# Patient Record
Sex: Female | Born: 1955 | Race: White | Hispanic: No | Marital: Single | Smoking: Former smoker
Health system: Southern US, Community
[De-identification: ages and names within clinical notes are randomized; demographics above are authoritative.]

## PROBLEM LIST (undated history)

## (undated) DIAGNOSIS — J45909 Unspecified asthma, uncomplicated: Secondary | ICD-10-CM

## (undated) DIAGNOSIS — Z789 Other specified health status: Secondary | ICD-10-CM

## (undated) HISTORY — PX: COLONOSCOPY: SHX174

## (undated) HISTORY — PX: ENDOMETRIAL ABLATION: SHX621

---

## 2002-03-16 ENCOUNTER — Ambulatory Visit (HOSPITAL_COMMUNITY): Admission: RE | Admit: 2002-03-16 | Discharge: 2002-03-16 | Payer: Self-pay | Admitting: *Deleted

## 2002-03-16 ENCOUNTER — Encounter: Payer: Self-pay | Admitting: *Deleted

## 2002-10-28 ENCOUNTER — Encounter: Payer: Self-pay | Admitting: Family Medicine

## 2002-10-28 ENCOUNTER — Ambulatory Visit (HOSPITAL_COMMUNITY): Admission: RE | Admit: 2002-10-28 | Discharge: 2002-10-28 | Payer: Self-pay | Admitting: Family Medicine

## 2003-02-15 ENCOUNTER — Ambulatory Visit (HOSPITAL_COMMUNITY): Admission: RE | Admit: 2003-02-15 | Discharge: 2003-02-15 | Payer: Self-pay | Admitting: Internal Medicine

## 2003-07-03 ENCOUNTER — Emergency Department (HOSPITAL_COMMUNITY): Admission: EM | Admit: 2003-07-03 | Discharge: 2003-07-03 | Payer: Self-pay | Admitting: General Practice

## 2004-04-06 ENCOUNTER — Ambulatory Visit (HOSPITAL_COMMUNITY): Admission: RE | Admit: 2004-04-06 | Discharge: 2004-04-06 | Payer: Self-pay | Admitting: Obstetrics & Gynecology

## 2005-04-10 ENCOUNTER — Ambulatory Visit (HOSPITAL_COMMUNITY): Admission: RE | Admit: 2005-04-10 | Discharge: 2005-04-10 | Payer: Self-pay | Admitting: Obstetrics & Gynecology

## 2005-05-28 ENCOUNTER — Ambulatory Visit (HOSPITAL_COMMUNITY): Admission: RE | Admit: 2005-05-28 | Discharge: 2005-05-28 | Payer: Self-pay | Admitting: Family Medicine

## 2005-10-02 ENCOUNTER — Ambulatory Visit (HOSPITAL_COMMUNITY): Admission: RE | Admit: 2005-10-02 | Discharge: 2005-10-02 | Payer: Self-pay | Admitting: Family Medicine

## 2007-03-17 ENCOUNTER — Ambulatory Visit (HOSPITAL_COMMUNITY): Admission: RE | Admit: 2007-03-17 | Discharge: 2007-03-17 | Payer: Self-pay | Admitting: Obstetrics & Gynecology

## 2007-03-24 ENCOUNTER — Ambulatory Visit (HOSPITAL_COMMUNITY): Admission: RE | Admit: 2007-03-24 | Discharge: 2007-03-24 | Payer: Self-pay | Admitting: Family Medicine

## 2007-04-09 ENCOUNTER — Ambulatory Visit (HOSPITAL_COMMUNITY): Admission: RE | Admit: 2007-04-09 | Discharge: 2007-04-09 | Payer: Self-pay | Admitting: Pulmonary Disease

## 2008-09-21 ENCOUNTER — Ambulatory Visit (HOSPITAL_COMMUNITY): Admission: RE | Admit: 2008-09-21 | Discharge: 2008-09-21 | Payer: Self-pay | Admitting: Obstetrics & Gynecology

## 2008-10-15 ENCOUNTER — Other Ambulatory Visit: Admission: RE | Admit: 2008-10-15 | Discharge: 2008-10-15 | Payer: Self-pay | Admitting: Obstetrics and Gynecology

## 2008-11-16 ENCOUNTER — Observation Stay: Payer: Self-pay | Admitting: Internal Medicine

## 2009-06-30 IMAGING — CT CT HEAD WITHOUT CONTRAST
1 series · 16 of 30 positions shown, 20 images · non-contrast
Comparison: none

REASON FOR EXAM: LEFT EYE VISION BLURRY WITH VERTIGO YESTERDAY
COMMENTS:

[Series 2: soft tissue · axial · 0.40mm/px · z∈[+412,+547]mm · 16 of 30 slices shown, 20 images]
[im 2/30  brain]
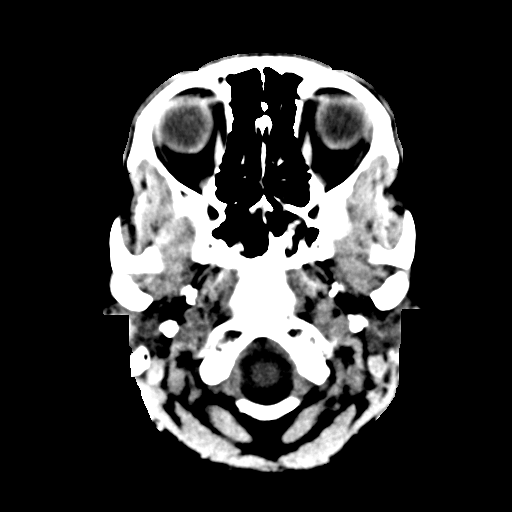
[im 2/30  bone]
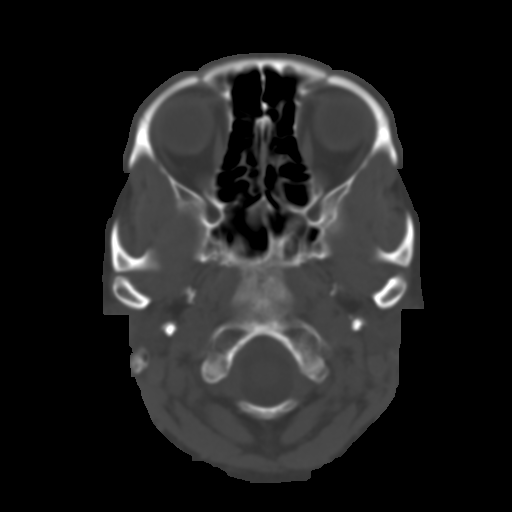
[im 4/30  brain]
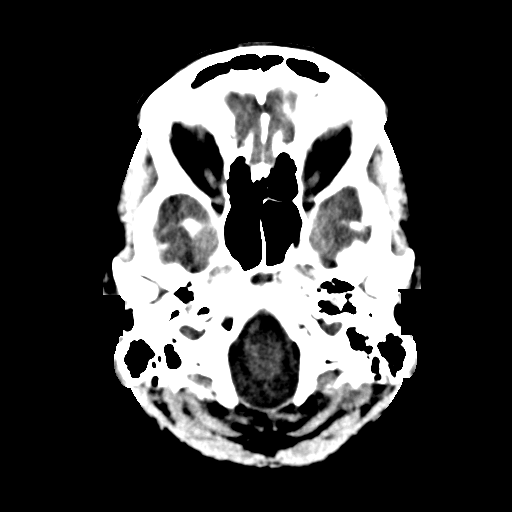
[im 6/30  brain]
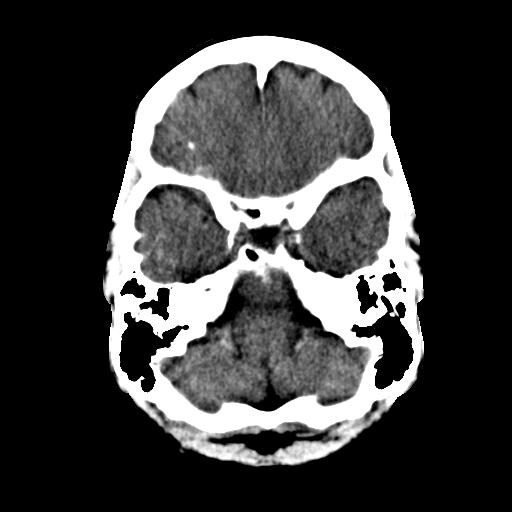
[im 8/30  brain]
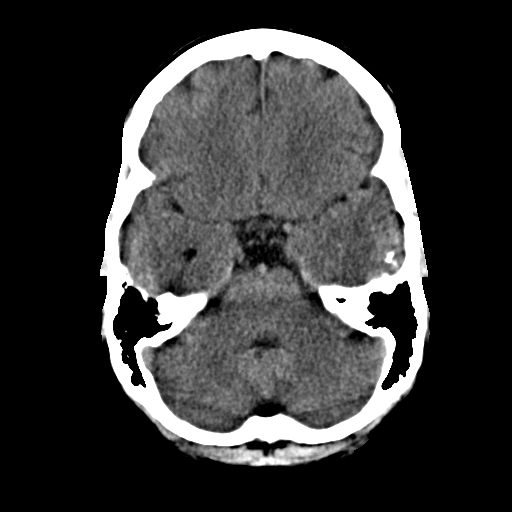
[im 9/30  brain]
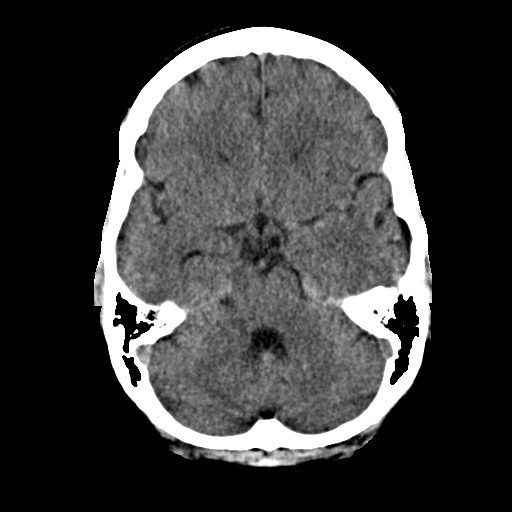
[im 9/30  bone]
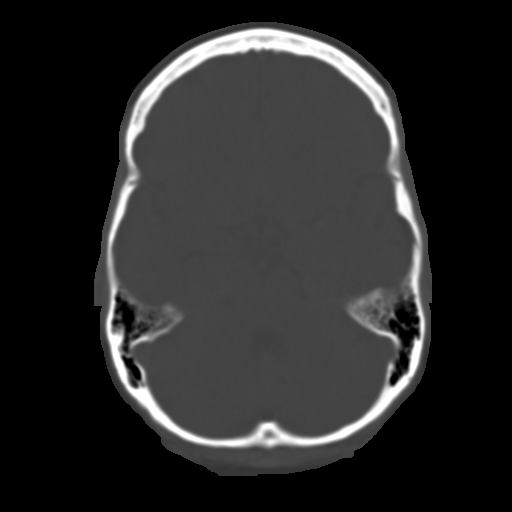
[im 11/30  brain]
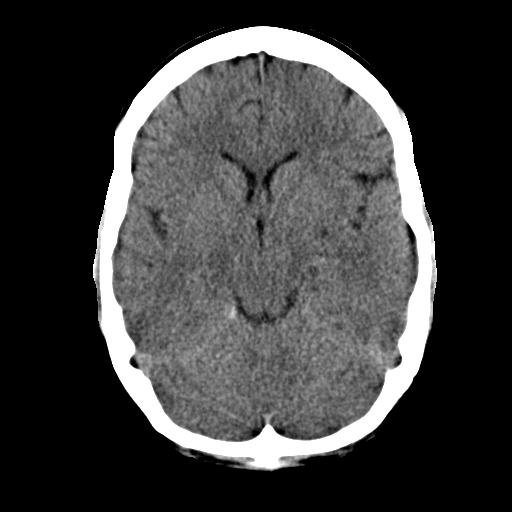
[im 13/30  brain]
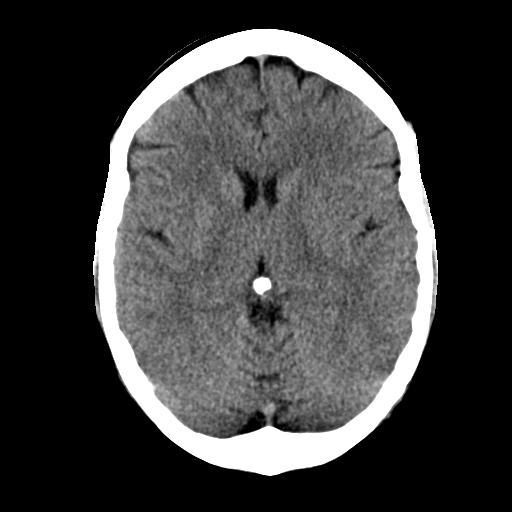
[im 15/30  brain]
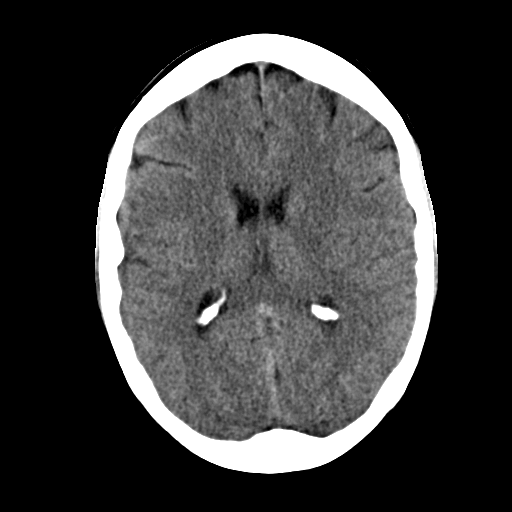
[im 16/30  brain]
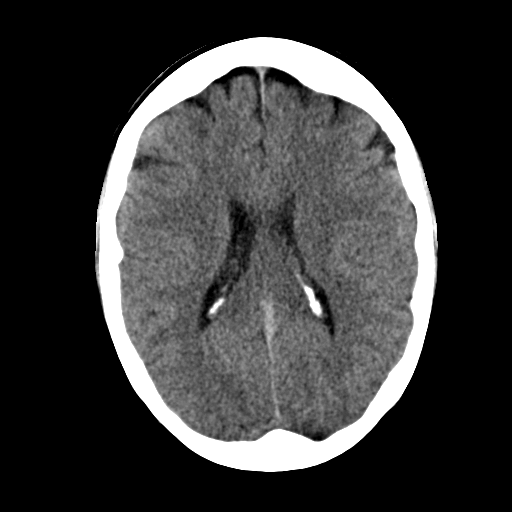
[im 16/30  bone]
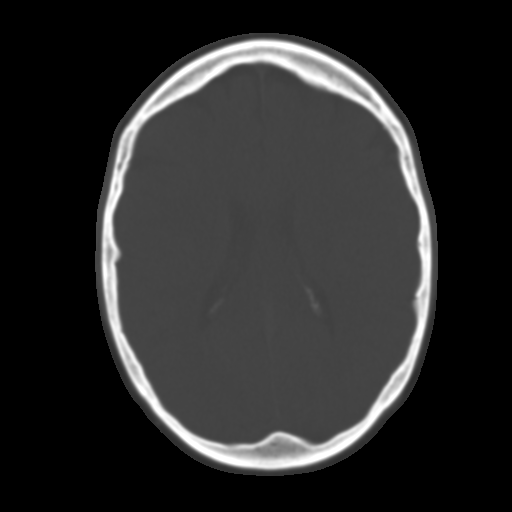
[im 18/30  brain]
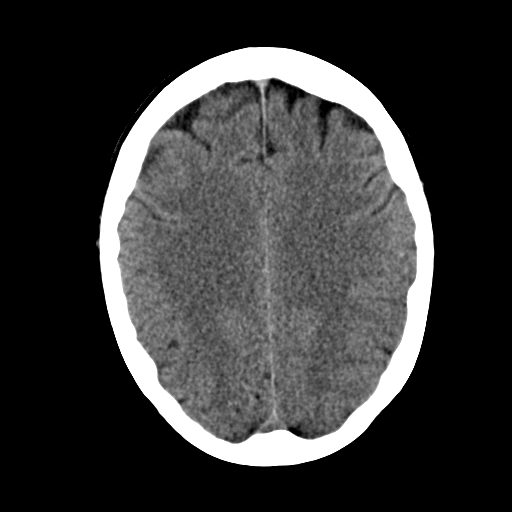
[im 20/30  brain]
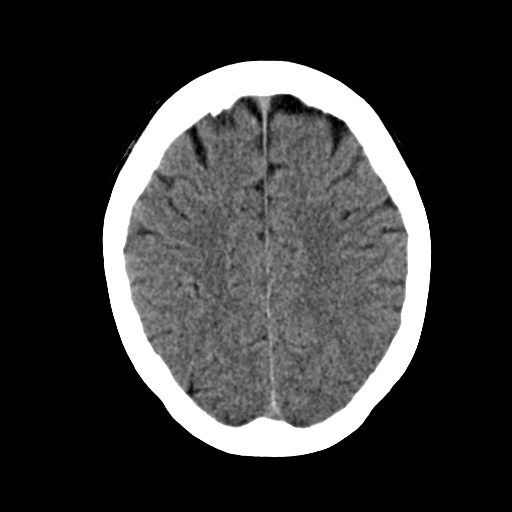
[im 22/30  brain]
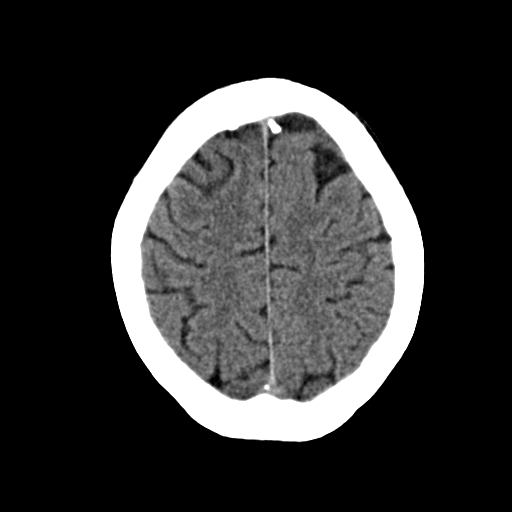
[im 23/30  brain]
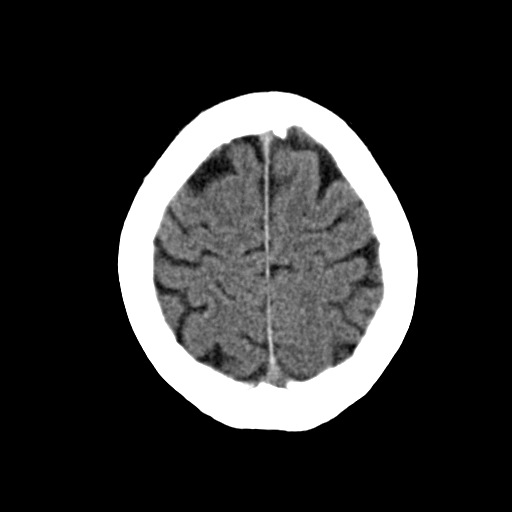
[im 23/30  bone]
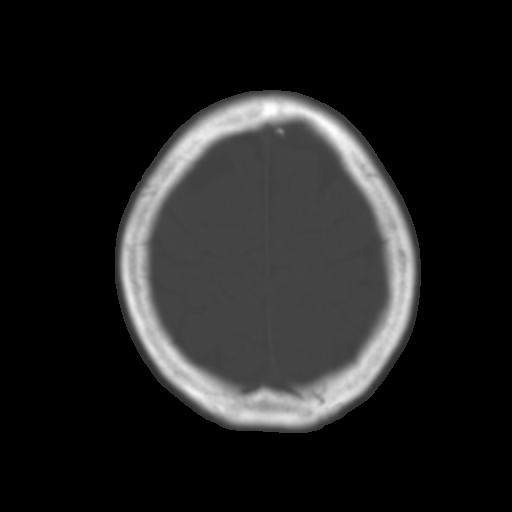
[im 25/30  brain]
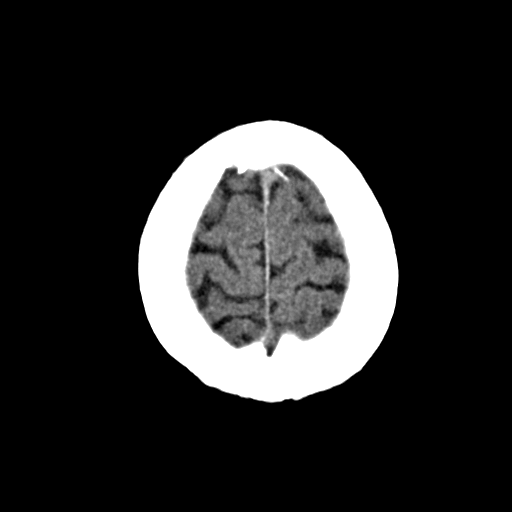
[im 27/30  brain]
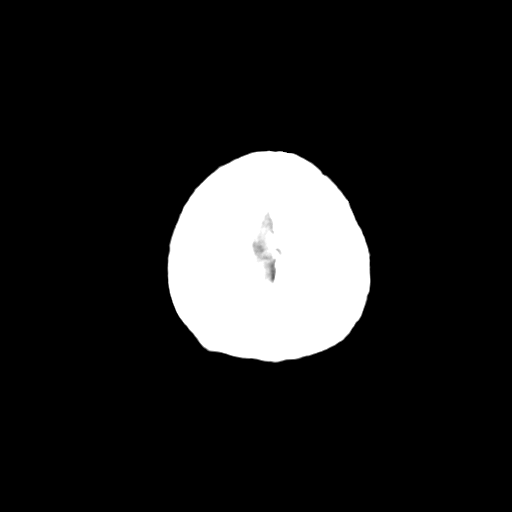
[im 29/30  brain]
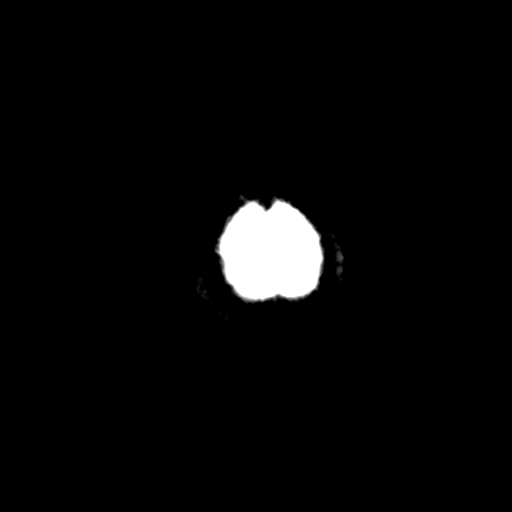

[16 of 30 positions shown; findings below may reference images not displayed]

PROCEDURE:     CT  - CT HEAD WITHOUT CONTRAST  - November 16, 2008 [DATE]

RESULT:     Emergent noncontrast CT of the brain is performed. The
ventricles and sulci appear to be within normal limits. There is no
hemorrhage, mass effect or midline shift. There is no territorial infarct.
There is minimal calcification in the left temporal region which is
nonspecific and base be secondary to prior injury. There is no mass effect.
There is no hemorrhage.

The included paranasal sinuses and mastoids demonstrate normal aeration. The
calvarium appears intact.
IMPRESSION: No acute intracranial abnormality. MRI followup may be
beneficial.

## 2011-01-05 NOTE — Procedures (Signed)
[name] Giles, [name] Giles              ACCOUNT NO.:  [id number]   MEDICAL RECORD NO.:  [id number]          PATIENT TYPE:  OUT   LOCATION:  RESP                          FACILITY:  [facility]   PHYSICIAN:  Edward L. Juanetta Gosling, M.D.DATE OF BIRTH:  1955-12-14   DATE OF PROCEDURE:  04/11/2007  DATE OF DISCHARGE:  04/09/2007                            PULMONARY FUNCTION TEST   1. Spirometry shows a mild ventilatory defect with evidence of airflow      obstruction.  2. Lung volumes are normal.  3. DLCO is mildly reduced.  4. There is no significant bronchodilator effect.  This is consistent      with chronic obstructive pulmonary disease.      Edward L. Juanetta Gosling, M.D.  Electronically Signed     ELH/MEDQ  D:  04/11/2007  T:  04/12/2007  Job:  [id number]   cc:   [facility]

## 2011-01-05 NOTE — Op Note (Signed)
[name] Giles, [name] Giles                        ACCOUNT NO.:  [id number]   MEDICAL RECORD NO.:  [id number]                   PATIENT TYPE:  AMB   LOCATION:  DAY                                  FACILITY:  [facility]   PHYSICIAN:  Lazaro Arms, M.D.                DATE OF BIRTH:  02-Oct-1955   DATE OF PROCEDURE:  04/06/2004  DATE OF DISCHARGE:                                 OPERATIVE REPORT   PREOPERATIVE DIAGNOSIS:  1. Menometrorrhagia.  2. Dysmenorrhea.   POSTOPERATIVE DIAGNOSIS:  1. Menometrorrhagia.  2. Dysmenorrhea.   PROCEDURE:  Hysteroscopy, D&C, endometrial ablation.   SURGEON:  Dr. Despina Hidden.   ANESTHESIA:  General endotracheal.   FINDINGS:  The patient had a shaggy endometrium, maybe a small polyp. It was  unclear if it was a polyp or just shaggy endometrium. The endometrium  otherwise was normal. She had a small endometria cavity.   DESCRIPTION OF OPERATION:  The patient was taken to the operating room,  placed in a supine position, where she underwent general endotracheal  anesthesia. She was prepped and draped in the usual sterile fashion after  being placed in dorsal lithotomy position. Speculum was placed. Cervix was  grasped single toothed tenaculum. Paracervical block was placed using 0.5%  Marcaine with 1 to 200,000 epinephrine. The uterus sounded to 7 cm. The  cervix was dilated serially to allow passage of a 30-degree 5-mm  hysteroscope. The endometrial cavity was distended with normal saline. The  above noted findings were seen. A vigorous curettage was performed.  Hysteroscopy was placed one more time, and again endometrium was cleaned.  Endometrial ablation was performed using the ThermaChoice 3. It required  only 9 cc of D5W heated to 88 degrees Celsius, for a total therapy time of 8  minutes and 32 seconds. The patient tolerated the procedure well. All fluid  was accounted for. She was awakened from anesthesia and taken to recovery  room in good and stable  condition. All counts were correct. She received  Ancef and Toradol prophylactically.      ___________________________________________                                            Lazaro Arms, M.D.   Loraine Maple  D:  04/06/2004  T:  04/06/2004  Job:  [id number]

## 2011-01-05 NOTE — Op Note (Signed)
NAME:  [name] Giles, [name] Giles                        ACCOUNT NO.:  [id number]   MEDICAL RECORD NO.:  [id number]                   PATIENT TYPE:  AMB   LOCATION:  DAY                                  FACILITY:  [facility]   PHYSICIAN:  R. Roetta Sessions, M.D.              DATE OF BIRTH:  06-03-1956   DATE OF PROCEDURE:  02/15/2003  DATE OF DISCHARGE:                                 OPERATIVE REPORT   PROCEDURE:  Esophagogastroduodenoscopy followed by colonoscopy with biopsy.   INDICATIONS FOR PROCEDURE:  The patient is a 55 year old lady with some  typical and atypical gastroesophageal reflux disease symptoms who has noted  significant improvement on a course of Nexium 40 mg orally daily. She was  seen in our office on January 27, 2003. She continues to have reflux daily in  spite of taking Nexium. She was taking caffeinated and carbonated soft  drinks in excess. She also reported small volume blood per rectum several  weeks ago, multiple episodes. Colonoscopy is now being done to further  evaluate her symptoms. Since being seen in the office on January 27, 2003, she  continues to have reflux symptoms daily in spite of taking Nexium 40 mg  orally daily. I discussed with her the concept of just going ahead and  looking in her upper GI tract as well today. The potential risks, benefits,  and alternatives have been reviewed. She is agreeable to having that done as  well. Consequently EGD and colonoscopy are now being performed. Please see  the documentation in the medical record.   MONITORING:  O2 saturation, blood pressure, pulse, and respirations were  monitored throughout the entirety of both procedures.   CONSCIOUS SEDATION FOR BOTH PROCEDURES:  Versed 5 mg IV, Demerol 100 mg IV  in divided doses.   INSTRUMENTS:  Olympus video chip adult gastroscope and pediatric  colonoscope.   EGD FINDINGS:  Examination of the tubular esophagus revealed no mucosal  abnormalities.  The EG junction was easily  traversed.   STOMACH:  The gastric cavity was empty and insufflated well with air. A  thorough examination of the gastric mucosa including a retroflexed view of  the proximal stomach and esophagogastric junction demonstrated no  abnormalities. The pylorus was patent and easily traversed.   DUODENUM:  The bulb and second portion appeared normal.   THERAPEUTIC/DIAGNOSTIC MANEUVERS:  None.   The patient tolerated the procedure well and was prepared for colonoscopy.   A digital rectal examination revealed no abnormalities.   ENDOSCOPIC FINDINGS:  The prep was good.   RECTUM:  Examination of the rectal mucosa including retroflexed view of the  anal verge revealed no internal hemorrhoids.   COLON:  The colonic mucosa was surveyed from the rectosigmoid junction  through the left transverse right colon to the area of the appendiceal  orifice, ileocecal valve and cecum. These structures were seen and  photographed for the record. The patient  was noted to have a  4 mm sessile  polyp in the cecum which was cold biopsied/removed. She also had scattered  sigmoid diverticulum. The remainder of the colonic mucosa was seen and  appeared normal from the level of the cecum to the ileocecal valve.  The  scope was slowly withdrawn. All previously mentioned mucosal surfaces were  again seen. No other abnormalities were observed. The patient tolerated both  procedures well and was reacted in endoscopy.   IMPRESSION:  1. Esophagogastroduodenoscopy.  Normal esophagus, stomach, D1 and D2.  2. Colonoscopy findings.  Internal hemorrhoids, otherwise, normal rectum.     Scattered sigmoid diverticula. Diminutive polyp in the cecum cold     biopsied/removed. The remainder of the colonic mucosa appeared normal.   RECOMMENDATIONS:  1. Hemorrhoid and diverticulosis literature given to Ms. Huskey.  2. Antireflux measures emphasized.  3. Increase Nexium to 40 mg orally twice daily before breakfast and supper.   4. Followup on path.  5. Office visit with [place] in one month.                                               Jonathon Bellows, M.D.    RMR/MEDQ  D:  02/15/2003  T:  02/15/2003  Job:  [id number]   cc:   Kirk Ruths, M.D.  [address]  [place]  [place] [address]  Fax: [phone]

## 2011-01-05 NOTE — H&P (Signed)
[name], [name]                          ACCOUNT NO.:  [id number]   MEDICAL RECORD NO.:  [id number]                  PATIENT TYPE:   LOCATION:                                       FACILITY:   PHYSICIAN:  [name] Giles, M.D.              DATE OF BIRTH:  April 29, 1956   DATE OF ADMISSION:  DATE OF DISCHARGE:                                HISTORY & PHYSICAL   HISTORY OF PRESENT ILLNESS:  This patient is a pleasant, 56 year old lady  sent over by [name] Giles to further evaluate an approximately 8 week  history of chest pressure, sore throat, heartburn symptoms.  She was  initially see by Dr. Domingo Giles approximately 1 month ago for cardiac workup  including a cardiogram, stress test, and echocardiogram with no evidence of  coronary disease.  She was begun on Nexium once daily 1 month ago.  She has  noted at least 50% improvement in the above-mentioned symptoms. She does  not have any alarm symptoms such as odynophagia or dysphagia.  She has not  had any abdominal pain, nausea, or vomiting.  It is notable that she has had  several episodes of small volume hematochezia, some 3-4 weeks ago which has  since cleared up.  She denies constipation or diarrhea.   No family history of colorectal neoplasia.  Does not take nonsteroidals.  She does note an 18 pound weight gain over the past 1 year.  She describes  working in a very stressful environment.  She smokes 1 pack of cigarettes  per day.  She consumes six 12-ounce diet cokes daily routinely.  She does  not get much exercise now days. She works in her job 10-12 hours every day.  She has never had her upper GI tract evaluated, but again, her symptoms just  started approximately 8 weeks ago.   PAST MEDICAL HISTORY:  Past medical history is significant for seasonal  allergies for which she takes Clarinex.  No prior surgeries.   CURRENT MEDICATIONS:  1. Clarinex 5 mg daily.  2. Nexium 40 mg orally daily.   ALLERGIES:  No  known drug allergies.   FAMILY HISTORY:  1. Mother succumbed to breast carcinoma.  2. Father was killed in an industrial accident.  3. No history of chronic GI or liver illness.   SOCIAL HISTORY:  The patient has been married for 19 years, has one 12-year-  old, adopted son.  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  She lives in [place].  She smokes 1 pack of  cigarettes per day.  No alcohol.   REVIEW OF SYSTEMS:  As in history of present illness.   PHYSICAL EXAMINATION:  GENERAL:  Physical examination reveals a pleasant, 28-  year-old lady resting comfortably.  VITAL SIGNS:  Weight 165.  Height 5 feet 6 inches.  Temperature 98.2, BP  130/82, pulse 86.  SKIN:  Warm and dry.  No cutaneous stigmata of chronic liver disease.  HEENT:  No scleral icterus.  Conjunctivae are pink.  NECK:  JVD is not prominent.  CHEST:  Lungs are clear to auscultation.  CARDIAC:  Regular rate and rhythm without murmur, gallop, or rub.  BREASTS:  Breast exam is deferred.  ABDOMEN:  Nondistended, positive bowel sounds.  Soft, minimal diffuse  tenderness to palpation.  No appreciable mass or organomegaly.  EXTREMITIES:  No edema.  RECTAL:  Deferred to the time of colonoscopy.   IMPRESSION:  1. This patient is a 55 year old lady with an 8-week history of sore throat,     chest pressure, and heartburn symptoms; now significantly improved with a     34-month course of Nexium.  It is reassuring to note that she had a     negative cardiac workup.  2. She does not have any alarm features.  She has relatively new onset     gastroesophageal reflux disease.  3. Most likely her symptoms have been precipitated by steady weight gain,     excessive intake of caffeinated/carbonated beverages and cigarette     smoking.   RECOMMENDATIONS:  1. We talked about the multiple problem approach for gastroesophageal reflux     disease.  I have asked her to decaffeinate and decarbonate  her diet.  I     have asked her to limit her intake of soft drinks to 1 daily.  Also     recommended that she stop smoking.  2. I stressed the correlation between weight gain and reflux and I have     asked her to try to lose 10 pounds in the next 10 months.  3. I see no need to perform an upper gastrointestinal endoscopy at this time     or further testing of gastroesophageal reflux.  4. However, as a totally separate issue, she reports hematochezia.  This     needs to be evaluated separately.  Consequently, I have offered the     patient colonoscopy.  I have discussed the potential risks, benefits, and     alternatives with this nice lady.  Questions were answered.  She is     agreeable.  Plan to perform colonoscopy in the near future.  5. She is to continue taking Nexium 40 mg orally daily. I have given her     literature on gastroesophageal reflux disease.  I have given her samples     of Nexium.  We will make further recommendations after colonoscopy has     been performed.   I would like to thank [name] Giles for allowing me to see this nice  lady today.                                               [name] Giles, M.D.    RMR/MEDQ  D:  01/27/2003  T:  01/27/2003  Job:  [id number]   cc:   [name] Giles, M.D.  [address]  [place]  [place] [address]  Fax: [phone]

## 2011-05-17 ENCOUNTER — Other Ambulatory Visit (HOSPITAL_COMMUNITY)
Admission: RE | Admit: 2011-05-17 | Discharge: 2011-05-17 | Disposition: A | Discharge status: Home / Self Care | Payer: BC Managed Care – PPO | Source: Ambulatory Visit | Attending: Obstetrics & Gynecology | Admitting: Obstetrics & Gynecology

## 2011-05-17 ENCOUNTER — Other Ambulatory Visit: Payer: Self-pay | Admitting: Obstetrics & Gynecology

## 2011-05-17 DIAGNOSIS — Z01419 Encounter for gynecological examination (general) (routine) without abnormal findings: Secondary | ICD-10-CM | POA: Insufficient documentation

## 2011-09-19 ENCOUNTER — Other Ambulatory Visit: Payer: Self-pay | Admitting: Obstetrics & Gynecology

## 2011-09-19 DIAGNOSIS — Z139 Encounter for screening, unspecified: Secondary | ICD-10-CM

## 2011-09-24 ENCOUNTER — Ambulatory Visit (HOSPITAL_COMMUNITY)
Admission: RE | Admit: 2011-09-24 | Discharge: 2011-09-24 | Disposition: A | Discharge status: Home / Self Care | Payer: BC Managed Care – PPO | Source: Ambulatory Visit | Attending: Obstetrics & Gynecology | Admitting: Obstetrics & Gynecology

## 2011-09-24 DIAGNOSIS — Z1231 Encounter for screening mammogram for malignant neoplasm of breast: Secondary | ICD-10-CM | POA: Insufficient documentation

## 2011-09-24 DIAGNOSIS — Z139 Encounter for screening, unspecified: Secondary | ICD-10-CM

## 2012-12-23 ENCOUNTER — Other Ambulatory Visit: Payer: Self-pay | Admitting: Obstetrics & Gynecology

## 2012-12-23 DIAGNOSIS — Z139 Encounter for screening, unspecified: Secondary | ICD-10-CM

## 2012-12-30 ENCOUNTER — Ambulatory Visit (HOSPITAL_COMMUNITY)
Admission: RE | Admit: 2012-12-30 | Discharge: 2012-12-30 | Disposition: A | Discharge status: Home / Self Care | Payer: BC Managed Care – PPO | Source: Ambulatory Visit | Attending: Obstetrics & Gynecology | Admitting: Obstetrics & Gynecology

## 2012-12-30 DIAGNOSIS — Z1231 Encounter for screening mammogram for malignant neoplasm of breast: Secondary | ICD-10-CM | POA: Insufficient documentation

## 2012-12-30 DIAGNOSIS — Z139 Encounter for screening, unspecified: Secondary | ICD-10-CM

## 2013-02-23 ENCOUNTER — Ambulatory Visit (HOSPITAL_COMMUNITY)
Admission: RE | Admit: 2013-02-23 | Discharge: 2013-02-23 | Disposition: A | Discharge status: Home / Self Care | Payer: BC Managed Care – PPO | Source: Ambulatory Visit | Attending: Family Medicine | Admitting: Family Medicine

## 2013-02-23 ENCOUNTER — Other Ambulatory Visit (HOSPITAL_COMMUNITY): Payer: Self-pay | Admitting: Family Medicine

## 2013-02-23 DIAGNOSIS — Z0000 Encounter for general adult medical examination without abnormal findings: Secondary | ICD-10-CM

## 2013-02-23 DIAGNOSIS — R05 Cough: Secondary | ICD-10-CM | POA: Insufficient documentation

## 2013-02-23 DIAGNOSIS — R059 Cough, unspecified: Secondary | ICD-10-CM | POA: Insufficient documentation

## 2013-02-23 DIAGNOSIS — N959 Unspecified menopausal and perimenopausal disorder: Secondary | ICD-10-CM

## 2013-02-23 DIAGNOSIS — F17200 Nicotine dependence, unspecified, uncomplicated: Secondary | ICD-10-CM | POA: Insufficient documentation

## 2013-02-23 DIAGNOSIS — R5383 Other fatigue: Secondary | ICD-10-CM

## 2013-02-23 DIAGNOSIS — R5381 Other malaise: Secondary | ICD-10-CM

## 2013-02-23 DIAGNOSIS — R0602 Shortness of breath: Secondary | ICD-10-CM | POA: Insufficient documentation

## 2013-02-25 ENCOUNTER — Ambulatory Visit (HOSPITAL_COMMUNITY)
Admission: RE | Admit: 2013-02-25 | Discharge: 2013-02-25 | Disposition: A | Discharge status: Home / Self Care | Payer: BC Managed Care – PPO | Source: Ambulatory Visit | Attending: Family Medicine | Admitting: Family Medicine

## 2013-02-25 DIAGNOSIS — Z0000 Encounter for general adult medical examination without abnormal findings: Secondary | ICD-10-CM

## 2013-02-25 DIAGNOSIS — M899 Disorder of bone, unspecified: Secondary | ICD-10-CM | POA: Insufficient documentation

## 2013-02-25 DIAGNOSIS — N959 Unspecified menopausal and perimenopausal disorder: Secondary | ICD-10-CM

## 2013-02-25 DIAGNOSIS — Z780 Asymptomatic menopausal state: Secondary | ICD-10-CM | POA: Insufficient documentation

## 2013-03-18 ENCOUNTER — Telehealth: Payer: Self-pay

## 2013-03-18 ENCOUNTER — Other Ambulatory Visit: Payer: Self-pay

## 2013-03-18 DIAGNOSIS — Z1211 Encounter for screening for malignant neoplasm of colon: Secondary | ICD-10-CM

## 2013-03-18 MED ORDER — PEG-KCL-NACL-NASULF-NA ASC-C 100 G PO SOLR: 1.0000 | ORAL | Status: DC

## 2013-03-18 NOTE — Telephone Encounter (Signed)
Appropriate.

## 2013-03-18 NOTE — Telephone Encounter (Signed)
Rx sent to the pharmacy and instructions mailed to pt.  

## 2013-03-18 NOTE — Telephone Encounter (Signed)
Gastroenterology Pre-Procedure Review   PT'S LAST COLONOSCOPY WAS 02/15/2003 WITH RMR  (SHE HAD EGD AND TCS)  HER NAME THEN WAS [name] Giles   Request Date: 03/18/2013 Requesting Physician: Dr. Regino Schultze  PATIENT REVIEW QUESTIONS: The patient responded to the following health history questions as indicated:    1. Diabetes Melitis: no 2. Joint replacements in the past 12 months: no 3. Major health problems in the past 3 months: no 4. Has an artificial valve or MVP: no 5. Has a defibrillator: no 6. Has been advised in past to take antibiotics in advance of a procedure like teeth cleaning: no    MEDICATIONS & ALLERGIES:    Patient reports the following regarding taking any blood thinners:   Plavix? no Aspirin? no Coumadin? no  Patient confirms/reports the following medications:  Current Outpatient Prescriptions  Medication Sig Dispense Refill  . naproxen sodium (ANAPROX) 220 MG tablet Take 220 mg by mouth daily.       No current facility-administered medications for this visit.    Patient confirms/reports the following allergies:  No Known Allergies  No orders of the defined types were placed in this encounter.    AUTHORIZATION INFORMATION Primary Insurance:   ID #:   Group #:  Pre-Cert / Auth required:  Pre-Cert / Auth #:   Secondary Insurance:   ID #:  Group #:  Pre-Cert / Auth required:  Pre-Cert / Auth #:   SCHEDULE INFORMATION: Procedure has been scheduled as follows:  Date: 04/06/2013   Time: 9:30 AM  Location: [facility] Short Stay  This Gastroenterology Pre-Precedure Review Form is being routed to the following provider(s): R. Roetta Sessions, MD

## 2013-03-19 NOTE — Telephone Encounter (Signed)
Pt's colonoscopy  in 2004 showed inflamed adenomatous polyps. Per Gerrit Halls, NP, the path is being scanned in.

## 2013-03-26 ENCOUNTER — Encounter (HOSPITAL_COMMUNITY): Payer: Self-pay | Admitting: Pharmacy Technician

## 2013-04-06 ENCOUNTER — Ambulatory Visit (HOSPITAL_COMMUNITY)
Admission: RE | Admit: 2013-04-06 | Discharge: 2013-04-06 | Disposition: A | Discharge status: Home / Self Care | Payer: BC Managed Care – PPO | Source: Ambulatory Visit | Attending: Internal Medicine | Admitting: Internal Medicine

## 2013-04-06 ENCOUNTER — Encounter (HOSPITAL_COMMUNITY)
Admission: RE | Disposition: A | Discharge status: Home / Self Care | Payer: Self-pay | Source: Ambulatory Visit | Attending: Internal Medicine

## 2013-04-06 ENCOUNTER — Encounter (HOSPITAL_COMMUNITY): Payer: Self-pay | Admitting: *Deleted

## 2013-04-06 DIAGNOSIS — D126 Benign neoplasm of colon, unspecified: Secondary | ICD-10-CM | POA: Insufficient documentation

## 2013-04-06 DIAGNOSIS — Z860100 Personal history of colon polyps, unspecified: Secondary | ICD-10-CM | POA: Insufficient documentation

## 2013-04-06 DIAGNOSIS — K5730 Diverticulosis of large intestine without perforation or abscess without bleeding: Secondary | ICD-10-CM

## 2013-04-06 DIAGNOSIS — Z791 Long term (current) use of non-steroidal anti-inflammatories (NSAID): Secondary | ICD-10-CM | POA: Insufficient documentation

## 2013-04-06 DIAGNOSIS — K648 Other hemorrhoids: Secondary | ICD-10-CM | POA: Insufficient documentation

## 2013-04-06 DIAGNOSIS — Z1211 Encounter for screening for malignant neoplasm of colon: Secondary | ICD-10-CM

## 2013-04-06 DIAGNOSIS — F17200 Nicotine dependence, unspecified, uncomplicated: Secondary | ICD-10-CM | POA: Insufficient documentation

## 2013-04-06 HISTORY — PX: COLONOSCOPY: SHX5424

## 2013-04-06 HISTORY — DX: Other specified health status: Z78.9

## 2013-04-06 SURGERY — COLONOSCOPY
Anesthesia: Moderate Sedation

## 2013-04-06 MED ORDER — MIDAZOLAM HCL 5 MG/5ML IJ SOLN
INTRAMUSCULAR | Status: DC | PRN
  Administered 2013-04-06 (×2): 1 mg via INTRAVENOUS
  Administered 2013-04-06: 2 mg via INTRAVENOUS
  Administered 2013-04-06: 1 mg via INTRAVENOUS

## 2013-04-06 MED ORDER — ONDANSETRON HCL 4 MG/2ML IJ SOLN
INTRAMUSCULAR | Status: AC
  Filled 2013-04-06: qty 2

## 2013-04-06 MED ORDER — MIDAZOLAM HCL 5 MG/5ML IJ SOLN
INTRAMUSCULAR | Status: AC
  Filled 2013-04-06: qty 10

## 2013-04-06 MED ORDER — MEPERIDINE HCL 100 MG/ML IJ SOLN
INTRAMUSCULAR | Status: DC | PRN
  Administered 2013-04-06: 25 mg via INTRAVENOUS
  Administered 2013-04-06: 50 mg via INTRAVENOUS
  Administered 2013-04-06: 25 mg via INTRAVENOUS

## 2013-04-06 MED ORDER — ONDANSETRON HCL 4 MG/2ML IJ SOLN
INTRAMUSCULAR | Status: DC | PRN
  Administered 2013-04-06: 4 mg via INTRAVENOUS

## 2013-04-06 MED ORDER — MEPERIDINE HCL 100 MG/ML IJ SOLN
INTRAMUSCULAR | Status: AC
  Filled 2013-04-06: qty 2

## 2013-04-06 MED ORDER — SODIUM CHLORIDE 0.9 % IV SOLN
INTRAVENOUS | Status: DC
  Administered 2013-04-06: 09:00:00 via INTRAVENOUS

## 2013-04-06 MED ORDER — STERILE WATER FOR IRRIGATION IR SOLN
Status: DC | PRN
  Administered 2013-04-06: 09:00:00

## 2013-04-06 NOTE — Op Note (Signed)
[facility] [address] [place], [address]   COLONOSCOPY PROCEDURE REPORT  PATIENT: [name] Giles, [name] Giles  MR#:         [id number] BIRTHDATE: 03/17/1956 , 56  yrs. old GENDER: Female ENDOSCOPIST: R.  Roetta Sessions, MD FACP FACG REFERRED BY:  Karleen Hampshire, M.D. PROCEDURE DATE:  04/06/2013 PROCEDURE:     Ileocolonoscopy with biopsy and snare polypectomy  INDICATIONS: distant history of colonic adenoma  INFORMED CONSENT:  The risks, benefits, alternatives and imponderables including but not limited to bleeding, perforation as well as the possibility of a missed lesion have been reviewed.  The potential for biopsy, lesion removal, etc. have also been discussed.  Questions have been answered.  All parties agreeable. Please see the history and physical in the medical record for more information.  MEDICATIONS: Versed 5 mg IV and Demerol 100 mg IV in divided doses. Zofran 4 mg  DESCRIPTION OF PROCEDURE:  After a digital rectal exam was performed, the EC-[id number] ([id number])  colonoscope was advanced from the anus through the rectum and colon to the area of the cecum, ileocecal valve and appendiceal orifice.  The cecum was deeply intubated.  These structures were well-seen and photographed for the record.  From the level of the cecum and ileocecal valve, the scope was slowly and cautiously withdrawn.  The mucosal surfaces were carefully surveyed utilizing scope tip deflection to facilitate fold flattening as needed.  The scope was pulled down into the rectum where a thorough examination including retroflexion was performed.    FINDINGS: Adequate preparation Internal hemorrhoids; otherwise, normal rectum. Scattered left-sided diverticula (2) sigmoid colon polyps both diminutive polyp (1) cold snared and the other cold biopsied. The patient had 2 polyps in the cecum  - the largest approximately 2 mm x 5 mm in dimensions. These polyps were cold biopsied and hot snared,  respectively.   The remainder of colonic mucosa appeared normal. The distal 5 cm of terminal ileum mucosa appeared normal.  THERAPEUTIC / DIAGNOSTIC MANEUVERS PERFORMED:  as outlined above.  COMPLICATIONS: None  CECAL WITHDRAWAL TIME:  12 minutes  IMPRESSION:  Colonic polyps-removed as described above. Colonic diverticulosis.  RECOMMENDATIONS: Followup on pathology.   _______________________________ eSigned:  R. Roetta Sessions, MD FACP [facility] 04/06/2013 9:58 AM   CC:    PATIENT NAME:  [name] Giles, [name] Giles MR#: [id number]

## 2013-04-06 NOTE — H&P (Signed)
  Primary Care Physician:  Kirk Ruths, MD Primary Gastroenterologist:  Dr. Jena Gauss  Pre-Procedure History & Physical: HPI:  [name] Giles is a 57 y.o. female is here for a  colonoscopy. History of adenomatous polyps removed in 2004.  Patient did not return for followup. No bowel symptoms. No family history of colon polyps or colon cancer.  Past Medical History  Diagnosis Date  . Medical history non-contributory     Past Surgical History  Procedure Laterality Date  . Endometrial ablation    . Colonoscopy      Prior to Admission medications   Medication Sig Start Date End Date Taking? Authorizing Provider  naproxen sodium (ANAPROX) 220 MG tablet Take 220 mg by mouth daily.   Yes Historical Provider, MD    Allergies as of 03/18/2013  . (No Known Allergies)    Family History  Problem Relation Age of Onset  . Colon cancer Neg Hx     History   Social History  . Marital Status: Divorced    Spouse Name: N/A    Number of Children: N/A  . Years of Education: N/A   Occupational History  . Not on file.   Social History Main Topics  . Smoking status: Current Every Day Smoker -- 1.00 packs/day for 30 years    Types: Cigarettes  . Smokeless tobacco: Not on file  . Alcohol Use: Yes     Comment: wine occasionally  . Drug Use: No  . Sexual Activity: Not on file   Other Topics Concern  . Not on file   Social History Narrative  . No narrative on file    Review of Systems: See HPI, otherwise negative ROS  Physical Exam: BP 125/56  Pulse 101  Temp(Src) 97.9 F (36.6 C) (Oral)  Resp 18  Ht 5\' 6"  (1.676 m)  Wt 145 lb (65.772 kg)  BMI 23.41 kg/m2  SpO2 95% General:   Alert,  Well-developed, well-nourished, pleasant and cooperative in NAD Head:  Normocephalic and atraumatic. Eyes:  Sclera clear, no icterus.   Conjunctiva pink. Ears:  Normal auditory acuity. Nose:  No deformity, discharge,  or lesions. Mouth:  No deformity or lesions, dentition normal. Neck:   Supple; no masses or thyromegaly. Lungs:  Clear throughout to auscultation.   No wheezes, crackles, or rhonchi. No acute distress. Heart:  Regular rate and rhythm; no murmurs, clicks, rubs,  or gallops. Abdomen:  Soft, nontender and nondistended. No masses, hepatosplenomegaly or hernias noted. Normal bowel sounds, without guarding, and without rebound.   Msk:  Symmetrical without gross deformities. Normal posture. Pulses:  Normal pulses noted. Extremities:  Without clubbing or edema. Neurologic:  Alert and  oriented x4;  grossly normal neurologically. Skin:  Intact without significant lesions or rashes. Cervical Nodes:  No significant cervical adenopathy. Psych:  Alert and cooperative. Normal mood and affect.  Impression/Plan: [name] Giles is now here to undergo a colonoscopy. Surveillance examination. History of colonic adenoma.    Risks, benefits, limitations, imponderables and alternatives regarding colonoscopy have been reviewed with the patient. Questions have been answered. All parties agreeable.

## 2013-04-07 ENCOUNTER — Encounter: Payer: Self-pay | Admitting: Internal Medicine

## 2013-04-07 ENCOUNTER — Encounter (HOSPITAL_COMMUNITY): Payer: Self-pay | Admitting: Internal Medicine

## 2014-03-10 ENCOUNTER — Other Ambulatory Visit: Payer: Self-pay | Admitting: Obstetrics & Gynecology

## 2014-03-10 DIAGNOSIS — Z1231 Encounter for screening mammogram for malignant neoplasm of breast: Secondary | ICD-10-CM

## 2014-05-18 ENCOUNTER — Emergency Department: Payer: Self-pay | Admitting: Emergency Medicine

## 2014-05-18 LAB — BASIC METABOLIC PANEL
Anion Gap: 6 — ABNORMAL LOW (ref 7–16)
BUN: 9 mg/dL (ref 7–18)
CALCIUM: 9.2 mg/dL (ref 8.5–10.1)
CHLORIDE: 105 mmol/L (ref 98–107)
CO2: 28 mmol/L (ref 21–32)
CREATININE: 0.7 mg/dL (ref 0.60–1.30)
EGFR (African American): >60
Glucose: 87 mg/dL (ref 65–99)
Osmolality: 276 (ref 275–301)
Potassium: 4 mmol/L (ref 3.5–5.1)
Sodium: 139 mmol/L (ref 136–145)

## 2014-05-18 LAB — CBC
HCT: 43.5 % (ref 35.0–47.0)
HGB: 14.2 g/dL (ref 12.0–16.0)
MCH: 30.3 pg (ref 26.0–34.0)
MCHC: 32.7 g/dL (ref 32.0–36.0)
MCV: 93 fL (ref 80–100)
PLATELETS: 269 10*3/uL (ref 150–440)
RBC: 4.71 10*6/uL (ref 3.80–5.20)
RDW: 13.3 % (ref 11.5–14.5)
WBC: 8.6 10*3/uL (ref 3.6–11.0)

## 2014-05-18 LAB — TROPONIN I: Troponin-I: <0.02 ng/mL

## 2015-04-07 ENCOUNTER — Other Ambulatory Visit: Payer: Self-pay | Admitting: Obstetrics & Gynecology

## 2015-04-07 DIAGNOSIS — Z1231 Encounter for screening mammogram for malignant neoplasm of breast: Secondary | ICD-10-CM

## 2015-04-11 ENCOUNTER — Ambulatory Visit (HOSPITAL_COMMUNITY)
Admission: RE | Admit: 2015-04-11 | Discharge: 2015-04-11 | Disposition: A | Discharge status: Home / Self Care | Payer: BLUE CROSS/BLUE SHIELD | Source: Ambulatory Visit | Attending: Obstetrics & Gynecology | Admitting: Obstetrics & Gynecology

## 2015-04-11 DIAGNOSIS — Z1231 Encounter for screening mammogram for malignant neoplasm of breast: Secondary | ICD-10-CM | POA: Insufficient documentation

## 2015-06-22 ENCOUNTER — Other Ambulatory Visit (HOSPITAL_COMMUNITY): Payer: Self-pay | Admitting: Family Medicine

## 2015-06-22 ENCOUNTER — Ambulatory Visit (HOSPITAL_COMMUNITY)
Admission: RE | Admit: 2015-06-22 | Discharge: 2015-06-22 | Disposition: A | Discharge status: Home / Self Care | Payer: BLUE CROSS/BLUE SHIELD | Source: Ambulatory Visit | Attending: Family Medicine | Admitting: Family Medicine

## 2015-06-22 DIAGNOSIS — J069 Acute upper respiratory infection, unspecified: Secondary | ICD-10-CM

## 2015-06-22 DIAGNOSIS — R05 Cough: Secondary | ICD-10-CM | POA: Diagnosis present

## 2015-08-01 ENCOUNTER — Other Ambulatory Visit: Payer: Self-pay | Admitting: Family Medicine

## 2015-08-01 DIAGNOSIS — Z780 Asymptomatic menopausal state: Secondary | ICD-10-CM

## 2016-04-30 ENCOUNTER — Other Ambulatory Visit (HOSPITAL_COMMUNITY): Payer: Self-pay | Admitting: Family Medicine

## 2016-04-30 ENCOUNTER — Other Ambulatory Visit (HOSPITAL_COMMUNITY): Payer: Self-pay | Admitting: Internal Medicine

## 2016-04-30 DIAGNOSIS — Z1231 Encounter for screening mammogram for malignant neoplasm of breast: Secondary | ICD-10-CM

## 2016-04-30 DIAGNOSIS — Z780 Asymptomatic menopausal state: Secondary | ICD-10-CM

## 2016-05-04 ENCOUNTER — Encounter: Payer: Self-pay | Admitting: Internal Medicine

## 2016-05-04 ENCOUNTER — Ambulatory Visit (INDEPENDENT_AMBULATORY_CARE_PROVIDER_SITE_OTHER): Payer: BLUE CROSS/BLUE SHIELD | Admitting: Internal Medicine

## 2016-05-04 ENCOUNTER — Ambulatory Visit: Payer: BLUE CROSS/BLUE SHIELD | Admitting: Internal Medicine

## 2016-05-04 VITALS — BP 134/80 | HR 80 | Ht 66.0 in | Wt 151.0 lb

## 2016-05-04 DIAGNOSIS — R42 Dizziness and giddiness: Secondary | ICD-10-CM | POA: Diagnosis not present

## 2016-05-04 DIAGNOSIS — R0602 Shortness of breath: Secondary | ICD-10-CM

## 2016-05-04 NOTE — Patient Instructions (Addendum)
Your physician recommends that you schedule a follow-up appointment in: To be Determined   Your physician has requested that you have an echocardiogram. Echocardiography is a painless test that uses sound waves to create images of your heart. It provides your doctor with information about the size and shape of your heart and how well your heart's chambers and valves are working. This procedure takes approximately one hour. There are no restrictions for this procedure.  Your physician recommends that you continue on your current medications as directed. Please refer to the Current Medication list given to you today.  If you need a refill on your cardiac medications before your next appointment, please call your pharmacy.  Thank you for choosing [facility]!

## 2016-05-04 NOTE — Progress Notes (Signed)
Cardiology Office Note   Date:  05/04/2016   ID:  [name] Giles, DOB 1956/01/29, MRN [id number]  PCP:  Glo Herring., MD  Cardiologist:   Dorris Carnes, MD    Pt referred for dizziness     History of Present Illness: [name] Giles is a 60 y.o. female with a history of dizziness/presyncope   PT had spell on July 28  [employer]  Stood on tip toes  Reached up  Then almost passed out  Held on to cart  Spinning   Nausea  Worst spell she has had   Spells started about 6 or 8 months ago  No illness prior   No syncope   After spell will feel a little washed out  Since July has had about 3 spells  None as severe  One occurred while sitting  Another occurred while looking up  / reaching  [facility] not having feels fine  Activities:  Works at [employer] hour to work  Not walking regularly  For about 5 years  Working on fixing up house   Appetite OK No change in [employer] goes slower  (push mow)  Not with vacuuming   NO CP   .   Mother had vertigo   Labs:  Hgb normal  LDL 109 EKG  SR Nonspecific ST changes (at Dr Nolon Rod office)  Outpatient Medications Prior to Visit  Medication Sig Dispense Refill  . naproxen sodium (ANAPROX) 220 MG tablet Take 220 mg by mouth daily.     No facility-administered medications prior to visit.      Allergies:   Review of patient's allergies indicates no known allergies.   Past Medical History:  Diagnosis Date  . Medical history non-contributory     Past Surgical History:  Procedure Laterality Date  . COLONOSCOPY    . COLONOSCOPY N/A 04/06/2013   Procedure: COLONOSCOPY;  Surgeon: Daneil Dolin, MD;  Location: [facility];  Service: Endoscopy;  Laterality: N/A;  9:30 AM  . ENDOMETRIAL ABLATION       Social History:  The patient  reports that she has been smoking Cigarettes.  She has a 30.00 pack-year smoking history. She has never used smokeless tobacco. She reports that she drinks alcohol. She reports that she does not  use drugs.   Family History:  The patient's family history includes Breast cancer in her mother; Diabetes in her brother; Hypertension in her brother and brother.    ROS:  Please see the history of present illness. All other systems are reviewed and  Negative to the above problem except as noted.    PHYSICAL EXAM: VS:  BP 134/80 (BP Location: Left Arm, Patient Position: Sitting, Cuff Size: Normal)   Pulse 80   Ht 5\' 6"  (1.676 m)   Wt 151 lb (68.5 kg)   SpO2 97%   BMI 24.37 kg/m   GEN: Well nourished, well developed, in no acute distress  HEENT: normal  Neck: no JVD, carotid bruits, or masses Cardiac: RRR; no murmurs, rubs, or gallops,no edema  Respiratory:  clear to auscultation bilaterally, normal work of breathing GI: soft, nontender, nondistended, + BS  No hepatomegaly  MS: no deformity Moving all extremities   Skin: warm and dry, no rash Neuro:  Strength and sensation are intact Psych: euthymic mood, full affect   EKG:  EKG is not ordered today.   Lipid Panel No results found for: CHOL, TRIG, HDL, CHOLHDL, VLDL, LDLCALC, LDLDIRECT  Wt Readings from Last 3 Encounters:  05/04/16 151 lb (68.5 kg)  04/06/13 145 lb (65.8 kg)      ASSESSMENT AND PLAN:  1  Dizziness/presyncope  Began about 8 months ago  ONe bad spell  Interesting this and oneother occurred while pt looking up/ reaching . Not orthoststic  BP actually high ? Carotid sinus hypersensitivity. I encouraged her to drink fluids  Add some salt  Take BP when feels bad  Watch with neck extention.  (I did not check with carotid sinus massag in clnic  2.  SOB  Somewhat concerning   I would sched echo  If normal  ? Stress test  3.  Tob  Counselled on cessatoin  Will f/u with pt on test results  COnsider stress test id LVEF  Normal      Disposition:   FU with  in   Signed, Dorris Carnes, MD  05/04/2016 8:40 AM    [address], [place], [place]  [address] Phone: [phone]; Fax: [phone]

## 2016-05-07 ENCOUNTER — Ambulatory Visit (HOSPITAL_COMMUNITY)
Admission: RE | Admit: 2016-05-07 | Discharge: 2016-05-07 | Disposition: A | Discharge status: Home / Self Care | Payer: BLUE CROSS/BLUE SHIELD | Source: Ambulatory Visit | Attending: Family Medicine | Admitting: Family Medicine

## 2016-05-07 ENCOUNTER — Ambulatory Visit (HOSPITAL_COMMUNITY)
Admission: RE | Admit: 2016-05-07 | Discharge: 2016-05-07 | Disposition: A | Discharge status: Home / Self Care | Payer: BLUE CROSS/BLUE SHIELD | Source: Ambulatory Visit | Attending: Internal Medicine | Admitting: Internal Medicine

## 2016-05-07 DIAGNOSIS — Z13820 Encounter for screening for osteoporosis: Secondary | ICD-10-CM | POA: Insufficient documentation

## 2016-05-07 DIAGNOSIS — M810 Age-related osteoporosis without current pathological fracture: Secondary | ICD-10-CM | POA: Diagnosis not present

## 2016-05-07 DIAGNOSIS — Z1231 Encounter for screening mammogram for malignant neoplasm of breast: Secondary | ICD-10-CM | POA: Insufficient documentation

## 2016-05-07 DIAGNOSIS — Z780 Asymptomatic menopausal state: Secondary | ICD-10-CM

## 2016-05-11 ENCOUNTER — Ambulatory Visit (HOSPITAL_COMMUNITY)
Admission: RE | Admit: 2016-05-11 | Discharge: 2016-05-11 | Disposition: A | Discharge status: Home / Self Care | Payer: BLUE CROSS/BLUE SHIELD | Source: Ambulatory Visit | Attending: Internal Medicine | Admitting: Internal Medicine

## 2016-05-11 DIAGNOSIS — Z720 Tobacco use: Secondary | ICD-10-CM | POA: Insufficient documentation

## 2016-05-11 DIAGNOSIS — I340 Nonrheumatic mitral (valve) insufficiency: Secondary | ICD-10-CM | POA: Diagnosis not present

## 2016-05-11 DIAGNOSIS — I358 Other nonrheumatic aortic valve disorders: Secondary | ICD-10-CM | POA: Insufficient documentation

## 2016-05-11 DIAGNOSIS — R42 Dizziness and giddiness: Secondary | ICD-10-CM | POA: Insufficient documentation

## 2016-05-11 DIAGNOSIS — R0602 Shortness of breath: Secondary | ICD-10-CM | POA: Insufficient documentation

## 2016-05-11 LAB — ECHOCARDIOGRAM COMPLETE
AVLVOTPG: 4 mmHg
CHL CUP MV DEC (S): 282
CHL CUP STROKE VOLUME: 32 mL
E/e' ratio: 9.73
EWDT: 282 ms
FS: 38 % (ref 28–44)
IV/PV OW: 0.98
LA diam end sys: 31 mm
LA diam index: 1.73 cm/m2
LA vol A4C: 21.6 ml
LA vol: 26 mL
LASIZE: 31 mm
LAVOLIN: 14.5 mL/m2
LDCA: 2.54 cm2
LV E/e' medial: 9.73
LV E/e'average: 9.73
LV PW d: 8.9 mm — AB (ref 0.6–1.1)
LV SIMPSON'S DISK: 59
LV e' LATERAL: 7.83 cm/s
LVDIAVOL: 54 mL (ref 46–106)
LVDIAVOLIN: 30 mL/m2
LVOT VTI: 23.4 cm
LVOT diameter: 18 mm
LVOTPV: 104 cm/s
LVOTSV: 59 mL
LVSYSVOL: 22 mL (ref 14–42)
LVSYSVOLIN: 12 mL/m2
MV Peak grad: 2 mmHg
MV pk A vel: 109 m/s
MV pk E vel: 76.2 m/s
RV LATERAL S' VELOCITY: 12 cm/s
RV TAPSE: 22.6 mm
TDI e' lateral: 7.83
TDI e' medial: 6.42

## 2016-05-11 NOTE — Progress Notes (Signed)
*  PRELIMINARY RESULTS* Echocardiogram 2D Echocardiogram has been performed.  [name] Giles 05/11/2016, 9:01 AM

## 2016-05-16 ENCOUNTER — Telehealth: Payer: Self-pay | Admitting: *Deleted

## 2016-05-16 NOTE — Telephone Encounter (Signed)
-----   Message from Fay Records, MD sent at 05/14/2016  5:00 PM EDT ----- Echo shows normal pumping function of heart Valves working OK WIth her SOB/dyspnea I would recomm a regular treadmill test

## 2016-05-16 NOTE — Telephone Encounter (Signed)
Called patient with test results. No answer. Left message to call back.  

## 2016-05-17 ENCOUNTER — Other Ambulatory Visit: Payer: Self-pay

## 2016-05-17 DIAGNOSIS — R0602 Shortness of breath: Secondary | ICD-10-CM

## 2016-05-17 DIAGNOSIS — R0600 Dyspnea, unspecified: Secondary | ICD-10-CM

## 2016-05-18 ENCOUNTER — Telehealth: Payer: Self-pay

## 2016-05-18 NOTE — Telephone Encounter (Signed)
-----   Message from Fay Records, MD sent at 05/14/2016  5:00 PM EDT ----- Echo shows normal pumping function of heart Valves working OK WIth her SOB/dyspnea I would recomm a regular treadmill test

## 2016-05-18 NOTE — Telephone Encounter (Signed)
Pt declined gxt,will "think about it"  And call  back

## 2016-11-01 ENCOUNTER — Ambulatory Visit (HOSPITAL_COMMUNITY)
Admission: RE | Admit: 2016-11-01 | Discharge: 2016-11-01 | Disposition: A | Discharge status: Home / Self Care | Payer: BLUE CROSS/BLUE SHIELD | Source: Ambulatory Visit | Attending: Internal Medicine | Admitting: Internal Medicine

## 2016-11-01 ENCOUNTER — Other Ambulatory Visit (HOSPITAL_COMMUNITY): Payer: Self-pay | Admitting: Internal Medicine

## 2016-11-01 DIAGNOSIS — R059 Cough, unspecified: Secondary | ICD-10-CM

## 2016-11-01 DIAGNOSIS — R05 Cough: Secondary | ICD-10-CM

## 2016-11-01 DIAGNOSIS — J9811 Atelectasis: Secondary | ICD-10-CM | POA: Insufficient documentation

## 2017-04-04 ENCOUNTER — Ambulatory Visit (INDEPENDENT_AMBULATORY_CARE_PROVIDER_SITE_OTHER): Payer: BLUE CROSS/BLUE SHIELD | Admitting: Obstetrics & Gynecology

## 2017-04-04 ENCOUNTER — Encounter: Payer: Self-pay | Admitting: Obstetrics & Gynecology

## 2017-04-04 VITALS — BP 120/60 | HR 80 | Wt 150.0 lb

## 2017-04-04 DIAGNOSIS — Z01419 Encounter for gynecological examination (general) (routine) without abnormal findings: Secondary | ICD-10-CM | POA: Diagnosis not present

## 2017-04-04 DIAGNOSIS — Z1211 Encounter for screening for malignant neoplasm of colon: Secondary | ICD-10-CM

## 2017-04-04 DIAGNOSIS — Z1212 Encounter for screening for malignant neoplasm of rectum: Secondary | ICD-10-CM | POA: Diagnosis not present

## 2017-04-04 NOTE — Progress Notes (Signed)
Subjective:     [name] Giles is a 61 y.o. female here for a routine exam.  No LMP recorded. Patient has had an ablation. No obstetric history on file. Birth Control Method:  Post menopausal Menstrual Calendar(currently): amenorrheic  Current complaints: none.   Current acute medical issues:  none   Recent Gynecologic History No LMP recorded. Patient has had an ablation. Last Pap: 2012,  normal Last mammogram: 05/07/2016,  normal  Past Medical History:  Diagnosis Date  . Medical history non-contributory     Past Surgical History:  Procedure Laterality Date  . COLONOSCOPY    . COLONOSCOPY N/A 04/06/2013   Procedure: COLONOSCOPY;  Surgeon: Daneil Dolin, MD;  Location: [facility];  Service: Endoscopy;  Laterality: N/A;  9:30 AM  . ENDOMETRIAL ABLATION      OB History    No data available      Social History   Social History  . Marital status: Unknown    Spouse name: N/A  . Number of children: N/A  . Years of education: N/A   Social History Main Topics  . Smoking status: Current Every Day Smoker    Packs/day: 1.00    Years: 30.00    Types: Cigarettes  . Smokeless tobacco: Never Used  . Alcohol use Yes     Comment: wine occasionally  . Drug use: No  . Sexual activity: Not Currently   Other Topics Concern  . None   Social History Narrative  . None    Family History  Problem Relation Age of Onset  . Adopted: Yes  . Breast cancer Mother   . Hypertension Brother   . Diabetes Brother   . Hypertension Brother   . Colon cancer Neg Hx      Current Outpatient Prescriptions:  .  albuterol (PROVENTIL) 4 MG tablet, Take 4 mg by mouth 3 (three) times daily., Disp: , Rfl:  .  buPROPion (ZYBAN) 150 MG 12 hr tablet, Take 150 mg by mouth 2 (two) times daily., Disp: , Rfl:  .  escitalopram (LEXAPRO) 10 MG tablet, Take 10 mg by mouth daily., Disp: , Rfl:  .  terbinafine (LAMISIL) 250 MG tablet, Take 250 mg by mouth daily., Disp: , Rfl:  .  naproxen sodium  (ANAPROX) 220 MG tablet, Take 220 mg by mouth daily., Disp: , Rfl:   Review of Systems  Review of Systems  Constitutional: Negative for fever, chills, weight loss, malaise/fatigue and diaphoresis.  HENT: Negative for hearing loss, ear pain, nosebleeds, congestion, sore throat, neck pain, tinnitus and ear discharge.   Eyes: Negative for blurred vision, double vision, photophobia, pain, discharge and redness.  Respiratory: Negative for cough, hemoptysis, sputum production, shortness of breath, wheezing and stridor.   Cardiovascular: Negative for chest pain, palpitations, orthopnea, claudication, leg swelling and PND.  Gastrointestinal: negative for abdominal pain. Negative for heartburn, nausea, vomiting, diarrhea, constipation, blood in stool and melena.  Genitourinary: Negative for dysuria, urgency, frequency, hematuria and flank pain.  Musculoskeletal: Negative for myalgias, back pain, joint pain and falls.  Skin: Negative for itching and rash.  Neurological: Negative for dizziness, tingling, tremors, sensory change, speech change, focal weakness, seizures, loss of consciousness, weakness and headaches.  Endo/Heme/Allergies: Negative for environmental allergies and polydipsia. Does not bruise/bleed easily.  Psychiatric/Behavioral: Negative for depression, suicidal ideas, hallucinations, memory loss and substance abuse. The patient is not nervous/anxious and does not have insomnia.        Objective:  Blood pressure 120/60, pulse 80, weight  150 lb (68 kg).   Physical Exam  Vitals reviewed. Constitutional: She is oriented to person, place, and time. She appears well-developed and well-nourished.  HENT:  Head: Normocephalic and atraumatic.        Right Ear: External ear normal.  Left Ear: External ear normal.  Nose: Nose normal.  Mouth/Throat: Oropharynx is clear and moist.  Eyes: Conjunctivae and EOM are normal. Pupils are equal, round, and reactive to light. Right eye exhibits no  discharge. Left eye exhibits no discharge. No scleral icterus.  Neck: Normal range of motion. Neck supple. No tracheal deviation present. No thyromegaly present.  Cardiovascular: Normal rate, regular rhythm, normal heart sounds and intact distal pulses.  Exam reveals no gallop and no friction rub.   No murmur heard. Respiratory: Effort normal and breath sounds normal. No respiratory distress. She has no wheezes. She has no rales. She exhibits no tenderness.  GI: Soft. Bowel sounds are normal. She exhibits no distension and no mass. There is no tenderness. There is no rebound and no guarding.  Genitourinary:  Breasts no masses skin changes or nipple changes bilaterally      Vulva is normal without lesions Vagina is pink moist without discharge Cervix normal in appearance and pap is done Uterus is normal size shape and contour Adnexa is negative with normal sized ovaries  {Rectal    hemoccult negative, normal tone, no masses  Musculoskeletal: Normal range of motion. She exhibits no edema and no tenderness.  Neurological: She is alert and oriented to person, place, and time. She has normal reflexes. She displays normal reflexes. No cranial nerve deficit. She exhibits normal muscle tone. Coordination normal.  Skin: Skin is warm and dry. No rash noted. No erythema. No pallor.  Psychiatric: She has a normal mood and affect. Her behavior is normal. Judgment and thought content normal.       Medications Ordered at today's visit: Meds ordered this encounter  Medications  . terbinafine (LAMISIL) 250 MG tablet    Sig: Take 250 mg by mouth daily.  Marland Kitchen escitalopram (LEXAPRO) 10 MG tablet    Sig: Take 10 mg by mouth daily.  Marland Kitchen buPROPion (ZYBAN) 150 MG 12 hr tablet    Sig: Take 150 mg by mouth 2 (two) times daily.  Marland Kitchen albuterol (PROVENTIL) 4 MG tablet    Sig: Take 4 mg by mouth 3 (three) times daily.    Other orders placed at today's visit: No orders of the defined types were placed in this  encounter.     Assessment:    Healthy female exam.    Plan:    Contraception: post menopausal status. Mammogram ordered. Follow up in: 2 years.     Return in about 2 years (around 04/05/2019) for yearly, with Dr Elonda Husky.

## 2017-12-02 ENCOUNTER — Ambulatory Visit (HOSPITAL_COMMUNITY)
Admission: RE | Admit: 2017-12-02 | Discharge: 2017-12-02 | Disposition: A | Discharge status: Home / Self Care | Payer: 59 | Source: Ambulatory Visit | Attending: Internal Medicine | Admitting: Internal Medicine

## 2017-12-02 ENCOUNTER — Other Ambulatory Visit (HOSPITAL_COMMUNITY): Payer: Self-pay | Admitting: Internal Medicine

## 2017-12-02 DIAGNOSIS — R05 Cough: Secondary | ICD-10-CM

## 2017-12-02 DIAGNOSIS — R059 Cough, unspecified: Secondary | ICD-10-CM

## 2018-02-24 ENCOUNTER — Encounter: Payer: Self-pay | Admitting: Internal Medicine

## 2019-04-16 ENCOUNTER — Other Ambulatory Visit (HOSPITAL_COMMUNITY): Payer: Self-pay | Admitting: Family Medicine

## 2019-04-16 DIAGNOSIS — Z1231 Encounter for screening mammogram for malignant neoplasm of breast: Secondary | ICD-10-CM

## 2019-04-22 ENCOUNTER — Ambulatory Visit (HOSPITAL_COMMUNITY)
Admission: RE | Admit: 2019-04-22 | Discharge: 2019-04-22 | Disposition: A | Discharge status: Home / Self Care | Payer: Managed Care, Other (non HMO) | Source: Ambulatory Visit | Attending: Family Medicine | Admitting: Family Medicine

## 2019-04-22 ENCOUNTER — Other Ambulatory Visit: Payer: Self-pay

## 2019-04-22 DIAGNOSIS — Z1231 Encounter for screening mammogram for malignant neoplasm of breast: Secondary | ICD-10-CM | POA: Diagnosis present

## 2019-12-03 ENCOUNTER — Encounter (HOSPITAL_COMMUNITY): Payer: Self-pay | Admitting: Emergency Medicine

## 2019-12-03 ENCOUNTER — Emergency Department (HOSPITAL_COMMUNITY)
Admission: EM | Admit: 2019-12-03 | Discharge: 2019-12-03 | Disposition: A | Discharge status: Home / Self Care | Payer: Managed Care, Other (non HMO) | Attending: Emergency Medicine | Admitting: Emergency Medicine

## 2019-12-03 ENCOUNTER — Other Ambulatory Visit: Payer: Self-pay

## 2019-12-03 ENCOUNTER — Emergency Department (HOSPITAL_COMMUNITY): Payer: Managed Care, Other (non HMO)

## 2019-12-03 DIAGNOSIS — Z791 Long term (current) use of non-steroidal anti-inflammatories (NSAID): Secondary | ICD-10-CM | POA: Diagnosis not present

## 2019-12-03 DIAGNOSIS — J45909 Unspecified asthma, uncomplicated: Secondary | ICD-10-CM | POA: Diagnosis not present

## 2019-12-03 DIAGNOSIS — M25512 Pain in left shoulder: Secondary | ICD-10-CM | POA: Diagnosis not present

## 2019-12-03 DIAGNOSIS — F17210 Nicotine dependence, cigarettes, uncomplicated: Secondary | ICD-10-CM | POA: Insufficient documentation

## 2019-12-03 DIAGNOSIS — Z79899 Other long term (current) drug therapy: Secondary | ICD-10-CM | POA: Diagnosis not present

## 2019-12-03 HISTORY — DX: Unspecified asthma, uncomplicated: J45.909

## 2019-12-03 LAB — CBC
HCT: 44 % (ref 36.0–46.0)
Hemoglobin: 14.1 g/dL (ref 12.0–15.0)
MCH: 29.9 pg (ref 26.0–34.0)
MCHC: 32 g/dL (ref 30.0–36.0)
MCV: 93.2 fL (ref 80.0–100.0)
Platelets: 269 10*3/uL (ref 150–400)
RBC: 4.72 MIL/uL (ref 3.87–5.11)
RDW: 13.1 % (ref 11.5–15.5)
WBC: 7.5 10*3/uL (ref 4.0–10.5)
nRBC: 0 % (ref 0.0–0.2)

## 2019-12-03 LAB — BASIC METABOLIC PANEL
Anion gap: 9 (ref 5–15)
BUN: 17 mg/dL (ref 8–23)
CO2: 28 mmol/L (ref 22–32)
Calcium: 9.3 mg/dL (ref 8.9–10.3)
Chloride: 102 mmol/L (ref 98–111)
Creatinine, Ser: 0.71 mg/dL (ref 0.44–1.00)
GFR calc Af Amer: >60 mL/min (ref >60)
GFR calc non Af Amer: >60 mL/min (ref >60)
Glucose, Bld: 105 mg/dL — ABNORMAL HIGH (ref 70–99)
Potassium: 4.4 mmol/L (ref 3.5–5.1)
Sodium: 139 mmol/L (ref 135–145)

## 2019-12-03 LAB — TROPONIN I (HIGH SENSITIVITY)
Troponin I (High Sensitivity): 3 ng/L (ref <18)
Troponin I (High Sensitivity): 3 ng/L (ref <18)

## 2019-12-03 MED ORDER — CYCLOBENZAPRINE HCL 10 MG PO TABS: 10.0000 mg | ORAL_TABLET | Freq: Two times a day (BID) | ORAL | 0 refills | Status: DC | PRN

## 2019-12-03 NOTE — ED Triage Notes (Signed)
Pt reports left arm for last week. Pt reports was sent over by [facility] for chest pain/cardiac work up. Pt reports was given aspirin and nitro at medical office with some improvement in left arm pain. Pt denies chest pain, shortness of breath. Pt reports throbbing sensation in left arm. Pt denies any known injury.

## 2019-12-03 NOTE — Discharge Instructions (Signed)
Take Flexeril as needed for muscle pain Continue Aleve as needed for pain Please follow up with your primary doctor or orthopedics

## 2019-12-03 NOTE — ED Provider Notes (Signed)
[facility] EMERGENCY DEPARTMENT Provider Note   CSN: [id number] Arrival date & time: 12/03/19  1224     History Chief Complaint  Patient presents with  . Arm Pain    [name] Giles is a 64 y.o. right-hand-dominant female who presents with left arm pain.  Patient states that on Thursday at work she was sitting down and she had a shooting pain radiating down her left arm from her shoulder to her hand.  Felt like fire and burning.  The pain has been constant since then but sometimes will ease off and sometimes be more severe.  Movement of the arm makes it worse.  Rest makes it better.  On Monday she states the pain was so severe she was in tears and so she made an appointment with her PCP.  She saw Dr. Hilma Favors today and was sent to the ED for chest pain rule out.  She denies any chest pain, shortness of breath, nausea, vomiting, diaphoresis, lightheadedness.  She has seen cardiology remotely approximately 15 years ago for chest pain and panic attacks and was diagnosed with a leaky heart valve.  She has been taking Tylenol and Aleve for pain with some relief.  She denies numbness, tingling, weakness of the left arm.  HPI     Past Medical History:  Diagnosis Date  . Asthma   . Medical history non-contributory     There are no problems to display for this patient.   Past Surgical History:  Procedure Laterality Date  . COLONOSCOPY    . COLONOSCOPY N/A 04/06/2013   Procedure: COLONOSCOPY;  Surgeon: Daneil Dolin, MD;  Location: [facility];  Service: Endoscopy;  Laterality: N/A;  9:30 AM  . ENDOMETRIAL ABLATION       OB History   No obstetric history on file.     Family History  Adopted: Yes  Problem Relation Age of Onset  . Breast cancer Mother   . Hypertension Brother   . Diabetes Brother   . Hypertension Brother   . Colon cancer Neg Hx     Social History   Tobacco Use  . Smoking status: Current Every Day Smoker    Packs/day: 1.00    Years: 30.00    Pack  years: 30.00    Types: Cigarettes  . Smokeless tobacco: Never Used  Substance Use Topics  . Alcohol use: Yes    Comment: wine occasionally  . Drug use: No    Home Medications Prior to Admission medications   Medication Sig Start Date End Date Taking? Authorizing Provider  albuterol (PROVENTIL) 4 MG tablet Take 4 mg by mouth 3 (three) times daily.    [provider]  buPROPion (ZYBAN) 150 MG 12 hr tablet Take 150 mg by mouth 2 (two) times daily.    [provider]  escitalopram (LEXAPRO) 10 MG tablet Take 10 mg by mouth daily.    [provider]  naproxen sodium (ANAPROX) 220 MG tablet Take 220 mg by mouth daily.    [provider]  terbinafine (LAMISIL) 250 MG tablet Take 250 mg by mouth daily.    [provider]    Allergies    Patient has no known allergies.  Review of Systems   Review of Systems  Constitutional: Negative for chills, diaphoresis and fever.  Respiratory: Negative for shortness of breath.   Cardiovascular: Negative for chest pain.  Gastrointestinal: Negative for nausea and vomiting.  Musculoskeletal: Positive for arthralgias and myalgias.  Neurological: Negative for  weakness and numbness.  All other systems reviewed and are negative.   Physical Exam Updated Vital Signs BP 140/62 (BP Location: Right Arm)   Pulse 91   Temp 98.2 F (36.8 C) (Oral)   Resp 18   Ht 5\' 6"  (1.676 m)   Wt 67.1 kg   SpO2 97%   BMI 23.89 kg/m   Physical Exam Vitals and nursing note reviewed.  Constitutional:      General: She is not in acute distress.    Appearance: Normal appearance. She is well-developed. She is not ill-appearing.  HENT:     Head: Normocephalic and atraumatic.  Eyes:     General: No scleral icterus.       Right eye: No discharge.        Left eye: No discharge.     Conjunctiva/sclera: Conjunctivae normal.     Pupils: Pupils are equal, round, and reactive to light.  Cardiovascular:     Rate and Rhythm:  Normal rate and regular rhythm.  Pulmonary:     Effort: Pulmonary effort is normal. No respiratory distress.     Breath sounds: Normal breath sounds.  Abdominal:     General: There is no distension.     Palpations: Abdomen is soft.     Tenderness: There is no abdominal tenderness.  Musculoskeletal:     Cervical back: Normal range of motion.     Comments: Left shoulder: Tenderness over the left scapula and left biceps tendon. Increased pain with ROM of the shoulder. No weakness.  No pain of the elbow, wrist, hand. 2+ radial pulse.  Skin:    General: Skin is warm and dry.  Neurological:     Mental Status: She is alert and oriented to person, place, and time.  Psychiatric:        Behavior: Behavior normal.     ED Results / Procedures / Treatments   Labs (all labs ordered are listed, but only abnormal results are displayed) Labs Reviewed  BASIC METABOLIC PANEL - Abnormal; Notable for the following components:      Result Value   Glucose, Bld 105 (*)    All other components within normal limits  CBC  TROPONIN I (HIGH SENSITIVITY)  TROPONIN I (HIGH SENSITIVITY)    EKG EKG Interpretation  Date/Time:  Thursday December 03 2019 12:42:08 EDT Ventricular Rate:  92 PR Interval:  128 QRS Duration: 80 QT Interval:  362 QTC Calculation: 447 R Axis:   83 Text Interpretation: Normal sinus rhythm Nonspecific ST abnormality Abnormal ECG No STEMI Confirmed by Nanda Quinton [phone]) on 12/03/2019 12:54:01 PM   Radiology DG Chest 2 View  Result Date: 12/03/2019 CLINICAL DATA:  Left upper extremity pain. Previous history of chest pain EXAM: CHEST - 2 VIEW COMPARISON:  December 02, 2017 FINDINGS: There is minimal scarring in the left base. Lungs elsewhere are clear. Heart size and pulmonary vascularity are normal. No adenopathy. There is aortic atherosclerosis. No pneumothorax. No bone lesions. IMPRESSION: Minimal scarring left base. Lungs elsewhere clear. Cardiac silhouette normal. Aortic  Atherosclerosis (ICD10-I70.0). Electronically Signed   By: Lowella Grip III M.D.   On: 12/03/2019 13:15    Procedures Procedures (including critical care time)  Medications Ordered in ED Medications - No data to display  ED Course  I have reviewed the triage vital signs and the nursing notes.  Pertinent labs & imaging results that were available during my care of the patient were reviewed by me and considered in my medical decision making (see  chart for details).  64 year old female presents for chest pain rule out because of left shoulder pain that is been going on for several days.  Pain is very atypical for ACS as she has clear tenderness of the shoulder and scapula which is reproducible with palpation and movement.  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  Vital signs are overall reassuring.  Heart is regular rate and rhythm.  Lungs are clear to auscultation.  EKG is normal sinus rhythm with nonspecific ST abnormality.  Labs and chest xray ordered  Labs are normal.  First and second troponin are normal.  Chest x-ray is negative.  Patient was encouraged to continue over-the-counter medicines for pain.  She was offered a muscle relaxer and is willing to try this.  PCP follow-up recommended  MDM Rules/Calculators/A&P                       Final Clinical Impression(s) / ED Diagnoses Final diagnoses:  Acute pain of left shoulder    Rx / DC Orders ED Discharge Orders    None       Recardo Evangelist, PA-C 12/04/19 1115    Long, W[facility], MD 12/05/19 [phone]

## 2019-12-04 ENCOUNTER — Other Ambulatory Visit (HOSPITAL_COMMUNITY): Payer: Self-pay | Admitting: Family Medicine

## 2019-12-04 DIAGNOSIS — E2839 Other primary ovarian failure: Secondary | ICD-10-CM

## 2020-07-17 ENCOUNTER — Other Ambulatory Visit: Payer: Self-pay

## 2020-07-17 ENCOUNTER — Ambulatory Visit
Admission: EM | Admit: 2020-07-17 | Discharge: 2020-07-17 | Disposition: A | Discharge status: Home / Self Care | Payer: Managed Care, Other (non HMO) | Attending: Emergency Medicine | Admitting: Emergency Medicine

## 2020-07-17 DIAGNOSIS — Z20822 Contact with and (suspected) exposure to covid-19: Secondary | ICD-10-CM

## 2020-07-17 DIAGNOSIS — J069 Acute upper respiratory infection, unspecified: Secondary | ICD-10-CM | POA: Diagnosis not present

## 2020-07-17 LAB — POCT RAPID STREP A (OFFICE): Rapid Strep A Screen: NEGATIVE

## 2020-07-17 MED ORDER — FLUTICASONE PROPIONATE 50 MCG/ACT NA SUSP: 2.0000 | Freq: Every day | NASAL | 0 refills | Status: DC

## 2020-07-17 MED ORDER — IBUPROFEN 600 MG PO TABS: 600.0000 mg | ORAL_TABLET | Freq: Four times a day (QID) | ORAL | 0 refills | Status: DC | PRN

## 2020-07-17 NOTE — ED Provider Notes (Addendum)
HPI  SUBJECTIVE:  Patient reports sore throat starting 2 days ago.  She has been taking over-the-counter cold medications with improvement in her symptoms.  No aggravating factors.   + Fever tmax 101   No neck stiffness  + Cough + Greenish-yellow nasal congestion that became clear this morning, rhinorrhea + Myalgias + Frontal sinus headache No upper dental pain, facial swelling. + Bilateral ear pain.  No change in hearing, otorrhea. No Rash  No loss of taste or smell No shortness of breath or difficulty breathing + nausea, no vomiting No diarrhea No abdominal pain     No Recent Strep, flu, COVID exposure Did not get Covid or flu vaccine No Allergy sxs  No Breathing difficulty, voice changes, sensation of throat swelling shut No Drooling No Trismus No abx in past month.   No antipyretic in past 4-6 hrs  Patient has no past medical history. LMP: 3 weeks ago.  Denies possibility being pregnant PMD: None   Past Medical History:  Diagnosis Date  . Asthma   . Medical history non-contributory     Past Surgical History:  Procedure Laterality Date  . COLONOSCOPY    . COLONOSCOPY N/A 04/06/2013   Procedure: COLONOSCOPY;  Surgeon: Daneil Dolin, MD;  Location: [facility];  Service: Endoscopy;  Laterality: N/A;  9:30 AM  . ENDOMETRIAL ABLATION      Family History  Adopted: Yes  Problem Relation Age of Onset  . Breast cancer Mother   . Hypertension Brother   . Diabetes Brother   . Hypertension Brother   . Colon cancer Neg Hx     Social History   Tobacco Use  . Smoking status: Current Every Day Smoker    Packs/day: 1.00    Years: 30.00    Pack years: 30.00    Types: Cigarettes  . Smokeless tobacco: Never Used  Substance Use Topics  . Alcohol use: Yes    Comment: wine occasionally  . Drug use: No    No current facility-administered medications for this encounter.  Current Outpatient Medications:  .  albuterol (PROVENTIL) 4 MG tablet, Take 4 mg by  mouth 3 (three) times daily., Disp: , Rfl:  .  albuterol (VENTOLIN HFA) 108 (90 Base) MCG/ACT inhaler, Inhale 1-2 puffs into the lungs every 4 (four) hours as needed., Disp: , Rfl:  .  fluticasone (FLONASE) 50 MCG/ACT nasal spray, Place 2 sprays into both nostrils daily., Disp: 16 g, Rfl: 0 .  ibuprofen (ADVIL) 600 MG tablet, Take 1 tablet (600 mg total) by mouth every 6 (six) hours as needed., Disp: 30 tablet, Rfl: 0 .  SYMBICORT 160-4.5 MCG/ACT inhaler, Inhale 2 puffs into the lungs daily., Disp: , Rfl:   No Known Allergies   ROS  As noted in HPI.   Physical Exam  BP (!) 168/91 (BP Location: Right Arm)   Pulse 82   Temp 98.6 F (37 C) (Oral)   Resp 14   SpO2 96%   Constitutional: Well developed, well nourished, no acute distress Eyes:  EOMI, conjunctiva normal bilaterally HENT: Normocephalic, atraumatic,mucus membranes moist.  TMs normal bilaterally.  + nasal congestion, erythematous, swollen turbinates.  Mucoid nasal congestion.  Positive maxillary and frontal sinus tenderness , worse left maxillary side.  Slightly erythematous oropharynx.  Normal tonsils no exudates.  Uvula midline.  No obvious postnasal drip.  Marland Kitchen  Respiratory: Normal inspiratory effort, lungs clear bilaterally Cardiovascular: Normal rate, no murmurs, rubs, gallops GI: nondistended, nontender. No appreciable splenomegaly skin: No rash,  skin intact Lymph: -  Anterior cervical LN.  No posterior cervical lymphadenopathy Musculoskeletal: no deformities Neurologic: Alert & oriented x 3, no focal neuro deficits Psychiatric: Speech and behavior appropriate.  ED Course   Medications - No data to display  Orders Placed This Encounter  Procedures  . Culture, group A strep    Standing Status:   Standing    Number of Occurrences:   1  . Resp Panel by RT-PCR (Flu A&B, Covid) Nasopharyngeal Swab    Standing Status:   Standing    Number of Occurrences:   1    Order Specific Question:   Is this test for diagnosis or  screening    Answer:   Diagnosis of ill patient    Order Specific Question:   Symptomatic for COVID-19 as defined by CDC    Answer:   Yes    Order Specific Question:   Date of Symptom Onset    Answer:   07/16/2020    Order Specific Question:   Hospitalized for COVID-19    Answer:   No    Order Specific Question:   Admitted to ICU for COVID-19    Answer:   No    Order Specific Question:   Previously tested for COVID-19    Answer:   No    Order Specific Question:   Resident in a congregate (group) care setting    Answer:   No    Order Specific Question:   Employed in healthcare setting    Answer:   No    Order Specific Question:   Pregnant    Answer:   No    Order Specific Question:   Has patient completed COVID vaccination(s) (2 doses of Pfizer/Moderna 1 dose of [employer])    Answer:   Yes  . Novel Coronavirus, NAA ([facility])    Standing Status:   Standing    Number of Occurrences:   1  . POCT rapid strep A    Standing Status:   Standing    Number of Occurrences:   1    No results found for this or any previous visit (from the past 24 hour(s)). No results found.  ED Clinical Impression  1. Acute upper respiratory infection   2. Encounter for laboratory testing for COVID-19 virus      ED Assessment/Plan   Rapid strep negative. Obtaining throat culture to guide antibiotic treatment.  Sending off COVID-may be candidate for infusion based on BMI..  Discussed this with patient. We'll contact her if culture is positive, and will call in Appropriate antibiotics if necessary. Patient home with ibuprofen, Tylenol, Benadryl/Maalox mixture, saline nasal irrigation, Flonase, Tylenol/ibuprofen, Mucinex Mucinex D. Patient to followup with PMD when necessary.  Discussed labs,  MDM, plan and followup with patient.  patient agrees with plan.   Meds ordered this encounter  Medications  . fluticasone (FLONASE) 50 MCG/ACT nasal spray    Sig: Place 2 sprays into both nostrils daily.     Dispense:  16 g    Refill:  0  . ibuprofen (ADVIL) 600 MG tablet    Sig: Take 1 tablet (600 mg total) by mouth every 6 (six) hours as needed.    Dispense:  30 tablet    Refill:  0     *This clinic note was created using [employer]. Therefore, there may be occasional mistakes despite careful proofreading.     Melynda Ripple, MD 07/19/20 1416    Melynda Ripple, MD  07/19/20 1416  

## 2020-07-17 NOTE — Discharge Instructions (Signed)
your rapid strep was negative today, so we have sent off a throat culture.  We will contact you and call in the appropriate antibiotics if your culture comes back positive for an infection requiring antibiotic treatment.  Give [place] a working phone number.  Your Covid test will take 24 to 48 hours to come back.  We will contact you if it is positive.  Flonase, saline nasal irrigation with a Milta Deiters med rinse and distilled water as often as you want, Mucinex or Mucinex D as long as the Mucinex D does not elevate your blood pressure.  Return here in 10 days if not better, double sickening, fevers above 102, and we can start you on antibiotics for sinus infection.   1 gram of Tylenol and 600 mg ibuprofen together 3-4 times a day as needed for pain.  Make sure you drink plenty of extra fluids.  Some people find salt water gargles and  Traditional Medicinal's "Throat Coat" tea helpful. Take 5 mL of liquid Benadryl and 5 mL of Maalox. Mix it together, and then hold it in your mouth for as long as you can and then swallow. You may do this 4 times a day.    Go to www.goodrx.com to look up your medications. This will give you a list of where you can find your prescriptions at the most affordable prices. Or ask the pharmacist what the cash price is, or if they have any other discount programs available to help make your medication more affordable. This can be less expensive than what you would pay with insurance.

## 2020-07-17 NOTE — ED Triage Notes (Signed)
Pt presents with c/o sore throat and nasal congestion that began yesterday

## 2020-07-19 LAB — SARS-COV-2, NAA 2 DAY TAT

## 2020-07-19 LAB — NOVEL CORONAVIRUS, NAA: SARS-CoV-2, NAA: NOT DETECTED

## 2020-07-19 LAB — CULTURE, GROUP A STREP (THRC)

## 2020-07-20 LAB — CULTURE, GROUP A STREP (THRC)

## 2020-10-19 ENCOUNTER — Other Ambulatory Visit (HOSPITAL_COMMUNITY): Payer: Self-pay | Admitting: Internal Medicine

## 2020-10-19 ENCOUNTER — Other Ambulatory Visit: Payer: Self-pay

## 2020-10-19 ENCOUNTER — Other Ambulatory Visit: Payer: Self-pay | Admitting: Internal Medicine

## 2020-10-19 ENCOUNTER — Ambulatory Visit (HOSPITAL_COMMUNITY)
Admission: RE | Admit: 2020-10-19 | Discharge: 2020-10-19 | Disposition: A | Discharge status: Home / Self Care | Payer: Managed Care, Other (non HMO) | Source: Ambulatory Visit | Attending: Internal Medicine | Admitting: Internal Medicine

## 2020-10-19 DIAGNOSIS — M79673 Pain in unspecified foot: Secondary | ICD-10-CM

## 2020-10-19 DIAGNOSIS — M5410 Radiculopathy, site unspecified: Secondary | ICD-10-CM | POA: Insufficient documentation

## 2020-10-19 DIAGNOSIS — R0989 Other specified symptoms and signs involving the circulatory and respiratory systems: Secondary | ICD-10-CM

## 2020-10-25 ENCOUNTER — Other Ambulatory Visit (HOSPITAL_COMMUNITY): Payer: Self-pay | Admitting: Internal Medicine

## 2020-10-25 DIAGNOSIS — R945 Abnormal results of liver function studies: Secondary | ICD-10-CM

## 2020-10-27 ENCOUNTER — Ambulatory Visit (HOSPITAL_COMMUNITY)
Admission: RE | Admit: 2020-10-27 | Discharge: 2020-10-27 | Disposition: A | Discharge status: Home / Self Care | Payer: Managed Care, Other (non HMO) | Source: Ambulatory Visit | Attending: Internal Medicine | Admitting: Internal Medicine

## 2020-10-27 ENCOUNTER — Other Ambulatory Visit: Payer: Self-pay

## 2020-10-27 DIAGNOSIS — M79673 Pain in unspecified foot: Secondary | ICD-10-CM

## 2020-10-27 DIAGNOSIS — R0989 Other specified symptoms and signs involving the circulatory and respiratory systems: Secondary | ICD-10-CM | POA: Diagnosis not present

## 2021-10-02 ENCOUNTER — Other Ambulatory Visit (HOSPITAL_COMMUNITY): Payer: Self-pay | Admitting: Internal Medicine

## 2021-10-02 DIAGNOSIS — R059 Cough, unspecified: Secondary | ICD-10-CM

## 2021-10-09 ENCOUNTER — Ambulatory Visit (HOSPITAL_COMMUNITY)
Admission: RE | Admit: 2021-10-09 | Discharge: 2021-10-09 | Disposition: A | Discharge status: Home / Self Care | Payer: 59 | Source: Ambulatory Visit | Attending: Internal Medicine | Admitting: Internal Medicine

## 2021-10-09 ENCOUNTER — Other Ambulatory Visit: Payer: Self-pay

## 2021-10-09 DIAGNOSIS — R059 Cough, unspecified: Secondary | ICD-10-CM | POA: Insufficient documentation

## 2022-03-29 ENCOUNTER — Emergency Department: Payer: 59

## 2022-03-29 ENCOUNTER — Emergency Department
Admission: EM | Admit: 2022-03-29 | Discharge: 2022-03-29 | Disposition: A | Discharge status: Other Acute Inpatient Hospital | Payer: 59 | Attending: Emergency Medicine | Admitting: Emergency Medicine

## 2022-03-29 ENCOUNTER — Encounter: Payer: Self-pay | Admitting: Emergency Medicine

## 2022-03-29 ENCOUNTER — Inpatient Hospital Stay (HOSPITAL_COMMUNITY): Payer: 59

## 2022-03-29 ENCOUNTER — Other Ambulatory Visit: Payer: Self-pay

## 2022-03-29 ENCOUNTER — Encounter (HOSPITAL_COMMUNITY): Payer: Self-pay

## 2022-03-29 ENCOUNTER — Inpatient Hospital Stay (HOSPITAL_COMMUNITY)
Admit: 2022-03-29 | Discharge: 2022-04-16 | DRG: 023 | Disposition: A | Discharge status: Rehabilitation Facility | Payer: 59 | Source: Other Acute Inpatient Hospital | Attending: Internal Medicine | Admitting: Internal Medicine

## 2022-03-29 DIAGNOSIS — R414 Neurologic neglect syndrome: Secondary | ICD-10-CM | POA: Diagnosis present

## 2022-03-29 DIAGNOSIS — G936 Cerebral edema: Secondary | ICD-10-CM | POA: Diagnosis present

## 2022-03-29 DIAGNOSIS — R682 Dry mouth, unspecified: Secondary | ICD-10-CM | POA: Diagnosis not present

## 2022-03-29 DIAGNOSIS — I619 Nontraumatic intracerebral hemorrhage, unspecified: Secondary | ICD-10-CM | POA: Insufficient documentation

## 2022-03-29 DIAGNOSIS — F17210 Nicotine dependence, cigarettes, uncomplicated: Secondary | ICD-10-CM | POA: Diagnosis present

## 2022-03-29 DIAGNOSIS — Z803 Family history of malignant neoplasm of breast: Secondary | ICD-10-CM

## 2022-03-29 DIAGNOSIS — I610 Nontraumatic intracerebral hemorrhage in hemisphere, subcortical: Secondary | ICD-10-CM | POA: Diagnosis not present

## 2022-03-29 DIAGNOSIS — I671 Cerebral aneurysm, nonruptured: Secondary | ICD-10-CM | POA: Diagnosis present

## 2022-03-29 DIAGNOSIS — S301XXA Contusion of abdominal wall, initial encounter: Secondary | ICD-10-CM | POA: Diagnosis not present

## 2022-03-29 DIAGNOSIS — I161 Hypertensive emergency: Secondary | ICD-10-CM | POA: Diagnosis present

## 2022-03-29 DIAGNOSIS — I69120 Aphasia following nontraumatic intracerebral hemorrhage: Secondary | ICD-10-CM | POA: Diagnosis not present

## 2022-03-29 DIAGNOSIS — I959 Hypotension, unspecified: Secondary | ICD-10-CM | POA: Diagnosis not present

## 2022-03-29 DIAGNOSIS — R112 Nausea with vomiting, unspecified: Secondary | ICD-10-CM | POA: Diagnosis present

## 2022-03-29 DIAGNOSIS — Z8249 Family history of ischemic heart disease and other diseases of the circulatory system: Secondary | ICD-10-CM

## 2022-03-29 DIAGNOSIS — Z781 Physical restraint status: Secondary | ICD-10-CM

## 2022-03-29 DIAGNOSIS — R059 Cough, unspecified: Secondary | ICD-10-CM | POA: Diagnosis present

## 2022-03-29 DIAGNOSIS — F05 Delirium due to known physiological condition: Secondary | ICD-10-CM | POA: Diagnosis present

## 2022-03-29 DIAGNOSIS — Z720 Tobacco use: Secondary | ICD-10-CM | POA: Diagnosis not present

## 2022-03-29 DIAGNOSIS — S0633AA Contusion and laceration of cerebrum, unspecified, with loss of consciousness status unknown, initial encounter: Secondary | ICD-10-CM

## 2022-03-29 DIAGNOSIS — R339 Retention of urine, unspecified: Secondary | ICD-10-CM | POA: Diagnosis not present

## 2022-03-29 DIAGNOSIS — G4700 Insomnia, unspecified: Secondary | ICD-10-CM | POA: Diagnosis present

## 2022-03-29 DIAGNOSIS — R29810 Facial weakness: Secondary | ICD-10-CM | POA: Diagnosis present

## 2022-03-29 DIAGNOSIS — R410 Disorientation, unspecified: Secondary | ICD-10-CM | POA: Diagnosis not present

## 2022-03-29 DIAGNOSIS — R509 Fever, unspecified: Secondary | ICD-10-CM | POA: Diagnosis not present

## 2022-03-29 DIAGNOSIS — E876 Hypokalemia: Secondary | ICD-10-CM | POA: Diagnosis not present

## 2022-03-29 DIAGNOSIS — E785 Hyperlipidemia, unspecified: Secondary | ICD-10-CM | POA: Diagnosis not present

## 2022-03-29 DIAGNOSIS — Z20822 Contact with and (suspected) exposure to covid-19: Secondary | ICD-10-CM | POA: Diagnosis present

## 2022-03-29 DIAGNOSIS — I69391 Dysphagia following cerebral infarction: Secondary | ICD-10-CM | POA: Diagnosis not present

## 2022-03-29 DIAGNOSIS — I10 Essential (primary) hypertension: Secondary | ICD-10-CM | POA: Diagnosis present

## 2022-03-29 DIAGNOSIS — E559 Vitamin D deficiency, unspecified: Secondary | ICD-10-CM | POA: Diagnosis present

## 2022-03-29 DIAGNOSIS — Q282 Arteriovenous malformation of cerebral vessels: Secondary | ICD-10-CM | POA: Diagnosis not present

## 2022-03-29 DIAGNOSIS — Z741 Need for assistance with personal care: Secondary | ICD-10-CM | POA: Diagnosis present

## 2022-03-29 DIAGNOSIS — G935 Compression of brain: Secondary | ICD-10-CM | POA: Diagnosis present

## 2022-03-29 DIAGNOSIS — R1310 Dysphagia, unspecified: Secondary | ICD-10-CM | POA: Diagnosis present

## 2022-03-29 DIAGNOSIS — R34 Anuria and oliguria: Secondary | ICD-10-CM | POA: Diagnosis not present

## 2022-03-29 DIAGNOSIS — Z7401 Bed confinement status: Secondary | ICD-10-CM

## 2022-03-29 DIAGNOSIS — R4701 Aphasia: Secondary | ICD-10-CM | POA: Diagnosis present

## 2022-03-29 DIAGNOSIS — I6389 Other cerebral infarction: Secondary | ICD-10-CM | POA: Diagnosis not present

## 2022-03-29 DIAGNOSIS — Z539 Procedure and treatment not carried out, unspecified reason: Secondary | ICD-10-CM | POA: Diagnosis not present

## 2022-03-29 DIAGNOSIS — I69191 Dysphagia following nontraumatic intracerebral hemorrhage: Secondary | ICD-10-CM | POA: Diagnosis present

## 2022-03-29 DIAGNOSIS — Z833 Family history of diabetes mellitus: Secondary | ICD-10-CM | POA: Diagnosis not present

## 2022-03-29 DIAGNOSIS — I674 Hypertensive encephalopathy: Secondary | ICD-10-CM | POA: Diagnosis not present

## 2022-03-29 DIAGNOSIS — J449 Chronic obstructive pulmonary disease, unspecified: Secondary | ICD-10-CM | POA: Insufficient documentation

## 2022-03-29 DIAGNOSIS — I160 Hypertensive urgency: Secondary | ICD-10-CM | POA: Diagnosis not present

## 2022-03-29 DIAGNOSIS — G911 Obstructive hydrocephalus: Secondary | ICD-10-CM | POA: Diagnosis present

## 2022-03-29 DIAGNOSIS — R7881 Bacteremia: Secondary | ICD-10-CM | POA: Diagnosis present

## 2022-03-29 DIAGNOSIS — J439 Emphysema, unspecified: Secondary | ICD-10-CM | POA: Diagnosis present

## 2022-03-29 DIAGNOSIS — Z79899 Other long term (current) drug therapy: Secondary | ICD-10-CM

## 2022-03-29 DIAGNOSIS — R4182 Altered mental status, unspecified: Secondary | ICD-10-CM | POA: Diagnosis present

## 2022-03-29 DIAGNOSIS — I611 Nontraumatic intracerebral hemorrhage in hemisphere, cortical: Secondary | ICD-10-CM | POA: Diagnosis not present

## 2022-03-29 DIAGNOSIS — F17200 Nicotine dependence, unspecified, uncomplicated: Secondary | ICD-10-CM | POA: Diagnosis not present

## 2022-03-29 DIAGNOSIS — E870 Hyperosmolality and hypernatremia: Secondary | ICD-10-CM | POA: Diagnosis present

## 2022-03-29 DIAGNOSIS — D72829 Elevated white blood cell count, unspecified: Secondary | ICD-10-CM | POA: Diagnosis present

## 2022-03-29 DIAGNOSIS — G919 Hydrocephalus, unspecified: Secondary | ICD-10-CM | POA: Diagnosis present

## 2022-03-29 DIAGNOSIS — G8191 Hemiplegia, unspecified affecting right dominant side: Secondary | ICD-10-CM | POA: Diagnosis present

## 2022-03-29 DIAGNOSIS — G9389 Other specified disorders of brain: Secondary | ICD-10-CM | POA: Diagnosis present

## 2022-03-29 DIAGNOSIS — Z23 Encounter for immunization: Secondary | ICD-10-CM | POA: Diagnosis not present

## 2022-03-29 DIAGNOSIS — R7303 Prediabetes: Secondary | ICD-10-CM | POA: Diagnosis not present

## 2022-03-29 DIAGNOSIS — Z7951 Long term (current) use of inhaled steroids: Secondary | ICD-10-CM

## 2022-03-29 DIAGNOSIS — I615 Nontraumatic intracerebral hemorrhage, intraventricular: Secondary | ICD-10-CM | POA: Diagnosis present

## 2022-03-29 DIAGNOSIS — B9689 Other specified bacterial agents as the cause of diseases classified elsewhere: Secondary | ICD-10-CM | POA: Diagnosis present

## 2022-03-29 DIAGNOSIS — E8809 Other disorders of plasma-protein metabolism, not elsewhere classified: Secondary | ICD-10-CM | POA: Diagnosis present

## 2022-03-29 LAB — URINALYSIS, ROUTINE W REFLEX MICROSCOPIC
Bacteria, UA: NONE SEEN
Bilirubin Urine: NEGATIVE
Glucose, UA: NEGATIVE mg/dL
Ketones, ur: NEGATIVE mg/dL
Leukocytes,Ua: NEGATIVE
Nitrite: NEGATIVE
Protein, ur: NEGATIVE mg/dL
Specific Gravity, Urine: 1.011 (ref 1.005–1.030)
pH: 5 (ref 5.0–8.0)

## 2022-03-29 LAB — CBC WITH DIFFERENTIAL/PLATELET
Abs Immature Granulocytes: 0.03 10*3/uL (ref 0.00–0.07)
Basophils Absolute: 0 10*3/uL (ref 0.0–0.1)
Basophils Relative: 0 %
Eosinophils Absolute: 0.2 10*3/uL (ref 0.0–0.5)
Eosinophils Relative: 2 %
HCT: 44.8 % (ref 36.0–46.0)
Hemoglobin: 14.5 g/dL (ref 12.0–15.0)
Immature Granulocytes: 0 %
Lymphocytes Relative: 31 %
Lymphs Abs: 2.9 10*3/uL (ref 0.7–4.0)
MCH: 29.7 pg (ref 26.0–34.0)
MCHC: 32.4 g/dL (ref 30.0–36.0)
MCV: 91.6 fL (ref 80.0–100.0)
Monocytes Absolute: 0.6 10*3/uL (ref 0.1–1.0)
Monocytes Relative: 7 %
Neutro Abs: 5.5 10*3/uL (ref 1.7–7.7)
Neutrophils Relative %: 60 %
Platelets: 207 10*3/uL (ref 150–400)
RBC: 4.89 MIL/uL (ref 3.87–5.11)
RDW: 13.4 % (ref 11.5–15.5)
WBC: 9.3 10*3/uL (ref 4.0–10.5)
nRBC: 0 % (ref 0.0–0.2)

## 2022-03-29 LAB — BLOOD GAS, VENOUS
Acid-Base Excess: 4.9 mmol/L — ABNORMAL HIGH (ref 0.0–2.0)
Bicarbonate: 31.4 mmol/L — ABNORMAL HIGH (ref 20.0–28.0)
O2 Saturation: 54.1 %
Patient temperature: 37
pCO2, Ven: 53 mmHg (ref 44–60)
pH, Ven: 7.38 (ref 7.25–7.43)
pO2, Ven: <31 mmHg — CL (ref 32–45)

## 2022-03-29 LAB — SARS CORONAVIRUS 2 BY RT PCR: SARS Coronavirus 2 by RT PCR: NEGATIVE

## 2022-03-29 LAB — URINE DRUG SCREEN, QUALITATIVE (ARMC ONLY)
Amphetamines, Ur Screen: NOT DETECTED
Barbiturates, Ur Screen: NOT DETECTED
Benzodiazepine, Ur Scrn: NOT DETECTED
Cannabinoid 50 Ng, Ur ~~LOC~~: NOT DETECTED
Cocaine Metabolite,Ur ~~LOC~~: NOT DETECTED
MDMA (Ecstasy)Ur Screen: NOT DETECTED
Methadone Scn, Ur: NOT DETECTED
Opiate, Ur Screen: NOT DETECTED
Phencyclidine (PCP) Ur S: NOT DETECTED
Tricyclic, Ur Screen: NOT DETECTED

## 2022-03-29 LAB — LIPID PANEL
Cholesterol: 190 mg/dL (ref 0–200)
HDL: 65 mg/dL (ref >40)
LDL Cholesterol: 119 mg/dL — ABNORMAL HIGH (ref 0–99)
Total CHOL/HDL Ratio: 2.9 RATIO
Triglycerides: 28 mg/dL (ref <150)
VLDL: 6 mg/dL (ref 0–40)

## 2022-03-29 LAB — TROPONIN I (HIGH SENSITIVITY): Troponin I (High Sensitivity): 6 ng/L (ref <18)

## 2022-03-29 LAB — HIV ANTIBODY (ROUTINE TESTING W REFLEX): HIV Screen 4th Generation wRfx: NONREACTIVE

## 2022-03-29 LAB — SODIUM
Sodium: 140 mmol/L (ref 135–145)
Sodium: 139 mmol/L (ref 135–145)

## 2022-03-29 LAB — MRSA NEXT GEN BY PCR, NASAL: MRSA by PCR Next Gen: NOT DETECTED

## 2022-03-29 LAB — LIPASE, BLOOD: Lipase: 39 U/L (ref 11–51)

## 2022-03-29 LAB — AMMONIA: Ammonia: 20 umol/L (ref 9–35)

## 2022-03-29 LAB — ETHANOL: Alcohol, Ethyl (B): <10 mg/dL (ref <10)

## 2022-03-29 LAB — CK: Total CK: 61 U/L (ref 38–234)

## 2022-03-29 MED ORDER — ACETAMINOPHEN 650 MG RE SUPP
650.0000 mg | RECTAL | Status: DC | PRN
  Administered 2022-03-29 – 2022-04-11 (×2): 650 mg via RECTAL
  Filled 2022-03-29 (×2): qty 1

## 2022-03-29 MED ORDER — GADOBUTROL 1 MMOL/ML IV SOLN
6.6000 mL | Freq: Once | INTRAVENOUS | Status: AC | PRN
Start: 2022-03-29 — End: 2022-03-30
  Administered 2022-03-30: 6.6 mL via INTRAVENOUS

## 2022-03-29 MED ORDER — STROKE: EARLY STAGES OF RECOVERY BOOK
Freq: Once | Status: AC
  Filled 2022-03-29: qty 1

## 2022-03-29 MED ORDER — MIDAZOLAM HCL 2 MG/2ML IJ SOLN
INTRAMUSCULAR | Status: AC
  Filled 2022-03-29: qty 2

## 2022-03-29 MED ORDER — FENTANYL CITRATE PF 50 MCG/ML IJ SOSY
50.0000 ug | PREFILLED_SYRINGE | Freq: Once | INTRAMUSCULAR | Status: AC
  Administered 2022-03-29: 50 ug via INTRAVENOUS
  Filled 2022-03-29: qty 1

## 2022-03-29 MED ORDER — CLEVIDIPINE BUTYRATE 0.5 MG/ML IV EMUL
0.0000 mg/h | INTRAVENOUS | Status: DC
  Administered 2022-03-29: 2 mg/h via INTRAVENOUS
  Filled 2022-03-29: qty 50

## 2022-03-29 MED ORDER — MIDAZOLAM HCL 2 MG/2ML IJ SOLN
1.0000 mg | Freq: Once | INTRAMUSCULAR | Status: AC
Start: 2022-03-29 — End: 2022-03-29
  Administered 2022-03-29: 1 mg via INTRAVENOUS

## 2022-03-29 MED ORDER — ACETAMINOPHEN 160 MG/5ML PO SOLN
650.0000 mg | ORAL | Status: DC | PRN
  Administered 2022-03-30 – 2022-04-06 (×8): 650 mg
  Filled 2022-03-29 (×8): qty 20.3

## 2022-03-29 MED ORDER — PANTOPRAZOLE SODIUM 40 MG IV SOLR
40.0000 mg | Freq: Every day | INTRAVENOUS | Status: DC
  Administered 2022-03-29: 40 mg via INTRAVENOUS
  Filled 2022-03-29: qty 10

## 2022-03-29 MED ORDER — ORAL CARE MOUTH RINSE: 15.0000 mL | OROMUCOSAL | Status: DC | PRN

## 2022-03-29 MED ORDER — SODIUM CHLORIDE 3 % IV SOLN
INTRAVENOUS | Status: DC
  Filled 2022-03-29 (×3): qty 500

## 2022-03-29 MED ORDER — SENNOSIDES-DOCUSATE SODIUM 8.6-50 MG PO TABS
1.0000 | ORAL_TABLET | Freq: Two times a day (BID) | ORAL | Status: DC
  Administered 2022-03-30 – 2022-03-31 (×2): 1 via ORAL
  Filled 2022-03-29 (×2): qty 1

## 2022-03-29 MED ORDER — ORAL CARE MOUTH RINSE
15.0000 mL | OROMUCOSAL | Status: DC
  Administered 2022-03-29 – 2022-04-16 (×60): 15 mL via OROMUCOSAL

## 2022-03-29 MED ORDER — CHLORHEXIDINE GLUCONATE CLOTH 2 % EX PADS
6.0000 | MEDICATED_PAD | Freq: Every day | CUTANEOUS | Status: DC
  Administered 2022-03-30 – 2022-04-09 (×9): 6 via TOPICAL

## 2022-03-29 MED ORDER — FENTANYL CITRATE PF 50 MCG/ML IJ SOSY
PREFILLED_SYRINGE | INTRAMUSCULAR | Status: AC
  Filled 2022-03-29: qty 1

## 2022-03-29 MED ORDER — CLEVIDIPINE BUTYRATE 0.5 MG/ML IV EMUL
0.0000 mg/h | INTRAVENOUS | Status: DC
  Administered 2022-03-29: 6 mg/h via INTRAVENOUS
  Administered 2022-03-30 – 2022-03-31 (×4): 2 mg/h via INTRAVENOUS
  Filled 2022-03-29 (×5): qty 50

## 2022-03-29 MED ORDER — CLEVIDIPINE BUTYRATE 0.5 MG/ML IV EMUL
INTRAVENOUS | Status: AC
  Filled 2022-03-29: qty 50

## 2022-03-29 MED ORDER — SODIUM CHLORIDE 3 % IV SOLN
INTRAVENOUS | Status: DC
  Filled 2022-03-29 (×6): qty 500

## 2022-03-29 MED ORDER — SODIUM CHLORIDE 3 % IV SOLN: INTRAVENOUS | Status: DC

## 2022-03-29 MED ORDER — FENTANYL CITRATE PF 50 MCG/ML IJ SOSY
12.5000 ug | PREFILLED_SYRINGE | Freq: Once | INTRAMUSCULAR | Status: AC
  Administered 2022-03-29: 12.5 ug via INTRAVENOUS
  Filled 2022-03-29: qty 1

## 2022-03-29 MED ORDER — ONDANSETRON HCL 4 MG/2ML IJ SOLN
INTRAMUSCULAR | Status: AC
  Administered 2022-03-29: 4 mg
  Filled 2022-03-29: qty 2

## 2022-03-29 MED ORDER — ACETAMINOPHEN 325 MG PO TABS
650.0000 mg | ORAL_TABLET | ORAL | Status: DC | PRN
  Administered 2022-04-07 – 2022-04-13 (×10): 650 mg via ORAL
  Filled 2022-03-29 (×12): qty 2

## 2022-03-29 MED ORDER — LABETALOL HCL 5 MG/ML IV SOLN: 20.0000 mg | Freq: Once | INTRAVENOUS | Status: DC

## 2022-03-29 NOTE — ED Provider Notes (Signed)
[facility] Provider Note    Event Date/Time   First MD Initiated Contact with Patient 03/29/22 1527     (approximate)   History   Altered Mental Status   HPI  [name] Giles is a 66 y.o. female with history of COPD who comes in with concerns for altered mental status.  Patient was found laying down on the ground in the bathroom.  Patient not wanting to answer questions or follow commands.  Patient is moving all extremities she denies any abdominal pain.  She is able to state "it is cold in here".  No known substance abuse. Unable to get full HPI due to patient's mental status       Physical Exam   Triage Vital Signs: Blood pressure (!) 136/93, pulse 74, temperature 97.8 F (36.6 C), temperature source Axillary, resp. rate 16, weight 66.7 kg, SpO2 94 %.   Most recent vital signs: Vitals:   03/29/22 1529  SpO2: 98%     General: Awake, no distress.  CV:  Good peripheral perfusion.  Resp:  Normal effort.  Abd:  No distention. Soft and non tender  Other:  Patient spontaneously moving all extremities.  She responds to tactile stimulation is able to state her name and brief words.  Does not have any obvious deficits but does not follow commands to do Full NHI stroke scale- Eyes are midline.    ED Results / Procedures / Treatments   Labs (all labs ordered are listed, but only abnormal results are displayed) Labs Reviewed  BLOOD GAS, VENOUS - Abnormal; Notable for the following components:      Result Value   pO2, Ven <31 (*)    Bicarbonate 31.4 (*)    Acid-Base Excess 4.9 (*)    All other components within normal limits  URINALYSIS, ROUTINE W REFLEX MICROSCOPIC - Abnormal; Notable for the following components:   Color, Urine YELLOW (*)    APPearance CLEAR (*)    Hgb urine dipstick SMALL (*)    All other components within normal limits  SARS CORONAVIRUS 2 BY RT PCR  CBC WITH DIFFERENTIAL/PLATELET  LIPASE, BLOOD  CK  AMMONIA  ETHANOL   URINE DRUG SCREEN, QUALITATIVE ([facility] ONLY)  SODIUM  SODIUM  SODIUM  SODIUM  BASIC METABOLIC PANEL  TROPONIN I (HIGH SENSITIVITY)  TROPONIN I (HIGH SENSITIVITY)     EKG  My interpretation of EKG:  Normal sinus rate 34 without any ST elevation or T wave inversions, intervals  RADIOLOGY I have reviewed the CT head personally interpreted the patient has evidence of intercranial hemorrhage  PROCEDURES:  Critical Care performed: Yes, see critical care procedure note(s)  .1-3 Lead EKG Interpretation  Performed by: Vanessa Geneva, MD Authorized by: Vanessa Lonepine, MD     Interpretation: normal     ECG rate:  75   ECG rate assessment: normal     Rhythm: sinus rhythm     Ectopy: none     Conduction: normal   .Critical Care  Performed by: Vanessa Montrose, MD Authorized by: Vanessa Fitzgerald, MD   Critical care provider statement:    Critical care time (minutes):  30   Critical care was necessary to treat or prevent imminent or life-threatening deterioration of the following conditions:  CNS failure or compromise   Critical care was time spent personally by me on the following activities:  Development of treatment plan with patient or surrogate, discussions with consultants, evaluation of patient's response to  treatment, examination of patient, ordering and review of laboratory studies, ordering and review of radiographic studies, ordering and performing treatments and interventions, pulse oximetry, re-evaluation of patient's condition and review of old [place] ED: Medications  ondansetron (ZOFRAN) 4 MG/2ML injection (4 mg  Given 03/29/22 1630)  fentaNYL (SUBLIMAZE) injection 12.5 mcg (12.5 mcg Intravenous Given 03/29/22 1708)     IMPRESSION / MDM / [place] / ED COURSE  I reviewed the triage vital signs and the nursing notes.   Patient's presentation is most consistent with acute presentation with potential threat to life or bodily function.    Differential is intracranial hemorrhage, Electra abnormalities, UTI.  Unclear LNK but was at work today. Seems to be moving all the extremities so less likely related ischemic stroke but concern for possible ICH/aneuysm bleed-- will get stat CT head and I called CT to get her down there.   CT head confirms intracranial hemorrhage.  Will start clevidipine for blood pressure discussed with neurosurgery and neurology  Labs show normal CBC.  CO2 normal on VBG ammonia negative  4:27 PM Discussed with Neurosurg- does not look like aneurysm recommend discussing with neurology  Discussed with neuro Dr. Leonel Ramsay he reviewed the CT imaging does not feel that there is impending herniation given patient's neurological examination at this time we will hold off on mannitol or hypertonic saline.  They are going to discuss with [facility] for ICU transfer.  Discussed with the neurologist kirkpatrick who reports they have a bed at the [facility], ICU therefore patient will be sent there.  I have updated the son who is at bedside about patient's critical condition.  We discussed intubation but at this time given she is moving all extremities and protecting airway we have held off on intubation not on oxygen. .There is a risk with transportation and was able to proceed   5:42 PM got a call from Dr. Cheral Marker about the concern for the uncal herniation on CT imaging.   I repeated my evaluation she does seem to be a little more sleepy and we had to get a little bit of fentanyl to help facilitate getting a Foley  SHe is still not spontaneously moving her arms or legs, eye midline    6:07 PM CT head shows slight increase in bleed but stable midline shift and similar impending herniation.  I discussed the case again with Dr. Cheral Marker and given [facility] is he ready to transport recommended her going over there stat.  I have discussed fully with the son who is at bedside about intubating her prior to going versus not  intubating her on route any risk for decompensation and death on route they understand the risk but they would like to proceed without intubating given she is still moving all extremities and protecting her airway without oxygen requirements- She has been here for over 2 hours without significant change.    Repeated my evaluation prior to patient going she still moving all 4  extremities although slightly less in the right and withdrawing to pain and opens up her eyes. Pt to [facility]    The patient is on the cardiac monitor to evaluate for evidence of arrhythmia and/or significant heart rate changes.      FINAL CLINICAL IMPRESSION(S) / ED DIAGNOSES   Final diagnoses:  Hemorrhagic stroke ([place])  Intraparenchymal hematoma of brain, with unknown loss of consciousness status, unspecified laterality, initial encounter ([place])     Rx /  DC Orders   ED Discharge Orders     None        Note:  This document was prepared using Dragon voice recognition software and may include unintentional dictation errors.   Vanessa Trimble, MD 03/29/22 2046

## 2022-03-29 NOTE — Consult Note (Signed)
Reason for Consult: Left temporal hemorrhage, intraventricular hemorrhage, hydrocephalus Referring Physician: Dr. Doreatha Massed is an 66 y.o. female.  HPI: The patient is a 66 year old white female with a history of COPD who developed acute nausea and vomiting and mental status changes today.  She was brought to [facility] where head scan demonstrated a left temporal hemorrhage with interventricular hemorrhage.  A follow-up scan demonstrates slight worsening of the hemorrhage and hydrocephalus.  The patient was transferred to [facility] hospital for further management.  Dr. Malen Gauze asked me to see the patient for placement of ventriculostomy.  Presently the patient is accompanied by her son, brother and sister-in-law.  She is confused and not cooperative.  Past Medical History:  Diagnosis Date   Asthma    Medical history non-contributory     Past Surgical History:  Procedure Laterality Date   COLONOSCOPY     COLONOSCOPY N/A 04/06/2013   Procedure: COLONOSCOPY;  Surgeon: Daneil Dolin, MD;  Location: [facility];  Service: Endoscopy;  Laterality: N/A;  9:30 AM   ENDOMETRIAL ABLATION      Family History  Adopted: Yes  Problem Relation Age of Onset   Breast cancer Mother    Hypertension Brother    Diabetes Brother    Hypertension Brother    Colon cancer Neg Hx     Social History:  reports that she has been smoking cigarettes. She has a 30.00 pack-year smoking history. She has never used smokeless tobacco. She reports current alcohol use. She reports that she does not use drugs.  Allergies: No Known Allergies  Medications: I have reviewed the patient's current medications. Prior to Admission:  Medications Prior to Admission  Medication Sig Dispense Refill Last Dose   albuterol (PROVENTIL) 4 MG tablet Take 4 mg by mouth 3 (three) times daily.      albuterol (VENTOLIN HFA) 108 (90 Base) MCG/ACT inhaler Inhale 1-2 puffs into the lungs every  4 (four) hours as needed.      fluticasone (FLONASE) 50 MCG/ACT nasal spray Place 2 sprays into both nostrils daily. (Patient not taking: Reported on 03/29/2022) 16 g 0    ibuprofen (ADVIL) 600 MG tablet Take 1 tablet (600 mg total) by mouth every 6 (six) hours as needed. 30 tablet 0    SYMBICORT 160-4.5 MCG/ACT inhaler Inhale 2 puffs into the lungs daily. (Patient not taking: Reported on 03/29/2022)      Scheduled:  [START ON 03/30/2022]  stroke: early stages of recovery book   Does not apply Once   [START ON 03/30/2022] Chlorhexidine Gluconate Cloth  6 each Topical Daily   fentaNYL       labetalol  20 mg Intravenous Once   midazolam       mouth rinse  15 mL Mouth Rinse 4 times per day   pantoprazole (PROTONIX) IV  40 mg Intravenous QHS   senna-docusate  1 tablet Oral BID   Continuous:  clevidipine 6 mg/hr (03/29/22 1910)   sodium chloride (hypertonic) 75 mL/hr at 03/29/22 1816   NWG:[id number] **OR** acetaminophen (TYLENOL) oral liquid 160 mg/5 mL **OR** acetaminophen, fentaNYL, midazolam, mouth rinse Anti-infectives (From admission, onward)    None        Results for orders placed or performed during the hospital encounter of 03/29/22 (from the past 48 hour(s))  CBC with Differential     Status: None   Collection Time: 03/29/22  3:33 PM  Result Value Ref Range   WBC 9.3 4.0 -  10.5 K/uL   RBC 4.89 3.87 - 5.11 MIL/uL   Hemoglobin 14.5 12.0 - 15.0 g/dL   HCT 44.8 36.0 - 46.0 %   MCV 91.6 80.0 - 100.0 fL   MCH 29.7 26.0 - 34.0 pg   MCHC 32.4 30.0 - 36.0 g/dL   RDW 13.4 11.5 - 15.5 %   Platelets 207 150 - 400 K/uL   nRBC 0.0 0.0 - 0.2 %   Neutrophils Relative % 60 %   Neutro Abs 5.5 1.7 - 7.7 K/uL   Lymphocytes Relative 31 %   Lymphs Abs 2.9 0.7 - 4.0 K/uL   Monocytes Relative 7 %   Monocytes Absolute 0.6 0.1 - 1.0 K/uL   Eosinophils Relative 2 %   Eosinophils Absolute 0.2 0.0 - 0.5 K/uL   Basophils Relative 0 %   Basophils Absolute 0.0 0.0 - 0.1 K/uL   Immature  Granulocytes 0 %   Abs Immature Granulocytes 0.03 0.00 - 0.07 K/uL    Comment: Performed at [facility], [address] [place], [place] [address]  Lipase, blood     Status: None   Collection Time: 03/29/22  3:33 PM  Result Value Ref Range   Lipase 39 11 - 51 U/L    Comment: Performed at [facility], [address] [place], [place] [address]  CK     Status: None   Collection Time: 03/29/22  3:33 PM  Result Value Ref Range   Total CK 61 38 - 234 U/L    Comment: Performed at [facility], [address] [place], [place] [address]  Troponin I (High Sensitivity)     Status: None   Collection Time: 03/29/22  3:33 PM  Result Value Ref Range   Troponin I (High Sensitivity) 6 <18 ng/L    Comment: (NOTE) Elevated high sensitivity troponin I (hsTnI) values and significant  changes across serial measurements may suggest ACS but many other  chronic and acute conditions are known to elevate hsTnI results.  Refer to the Links section for chest pain algorithms and additional  guidance. Performed at [facility], [address] [place], [place] [address]   Blood gas, venous     Status: Abnormal   Collection Time: 03/29/22  3:33 PM  Result Value Ref Range   pH, Ven 7.38 7.25 - 7.43   pCO2, Ven 53 44 - 60 mmHg   pO2, Ven <31 (LL) 32 - 45 mmHg   Bicarbonate 31.4 (H) 20.0 - 28.0 mmol/L   Acid-Base Excess 4.9 (H) 0.0 - 2.0 mmol/L   O2 Saturation 54.1 %   Patient temperature 37.0    Collection site VENOUS     Comment: Performed at [facility], [address], [place], [place] [address]  Ethanol     Status: None   Collection Time: 03/29/22  3:33 PM  Result Value Ref Range   Alcohol, Ethyl (B) <10 <10 mg/dL    Comment: (NOTE) Lowest detectable limit for serum alcohol is 10 mg/dL.  For medical purposes only. Performed at [facility], [address] [place],  [address]   SARS Coronavirus 2 by RT PCR (hospital order,  performed in [facility] hospital lab) *cepheid single result test* Anterior Nasal Swab     Status: None   Collection Time: 03/29/22  3:35 PM   Specimen: Anterior Nasal Swab  Result Value Ref Range   SARS Coronavirus 2 by RT PCR NEGATIVE NEGATIVE    Comment: (NOTE) SARS-CoV-2 target  nucleic acids are NOT DETECTED.  The SARS-CoV-2 RNA is generally detectable in upper and lower respiratory specimens during the acute phase of infection. The lowest concentration of SARS-CoV-2 viral copies this assay can detect is 250 copies / mL. A negative result does not preclude SARS-CoV-2 infection and should not be used as the sole basis for treatment or other patient management decisions.  A negative result may occur with improper specimen collection / handling, submission of specimen other than nasopharyngeal swab, presence of viral mutation(s) within the areas targeted by this assay, and inadequate number of viral copies (<250 copies / mL). A negative result must be combined with clinical observations, patient history, and epidemiological information.  Fact Sheet for Patients:   https://www.patel.info/  Fact Sheet for Healthcare Providers: https://hall.com/  This test is not yet approved or  cleared by the [place] FDA and has been authorized for detection and/or diagnosis of SARS-CoV-2 by FDA under an Emergency Use Authorization (EUA).  This EUA will remain in effect (meaning this test can be used) for the duration of the COVID-19 declaration under Section 564(b)(1) of the Act, 21 U.S.C. section 360bbb-3(b)(1), unless the authorization is terminated or revoked sooner.  Performed at [facility], [address] [place], [place] [address]   Ammonia     Status: None   Collection Time: 03/29/22  3:38 PM  Result Value Ref Range   Ammonia 20 9 - 35 umol/L    Comment: Performed at [facility], [address] [place],  [place] [address]  Sodium     Status: None   Collection Time: 03/29/22  3:38 PM  Result Value Ref Range   Sodium 140 135 - 145 mmol/L    Comment: Performed at [facility], [address] [place], [place] [address]  Urinalysis, Routine w reflex microscopic     Status: Abnormal   Collection Time: 03/29/22  4:54 PM  Result Value Ref Range   Color, Urine YELLOW (A) YELLOW   APPearance CLEAR (A) CLEAR   Specific Gravity, Urine 1.011 1.005 - 1.030   pH 5.0 5.0 - 8.0   Glucose, UA NEGATIVE NEGATIVE mg/dL   Hgb urine dipstick SMALL (A) NEGATIVE   Bilirubin Urine NEGATIVE NEGATIVE   Ketones, ur NEGATIVE NEGATIVE mg/dL   Protein, ur NEGATIVE NEGATIVE mg/dL   Nitrite NEGATIVE NEGATIVE   Leukocytes,Ua NEGATIVE NEGATIVE   RBC / HPF 0-5 0 - 5 RBC/hpf   WBC, UA 0-5 0 - 5 WBC/hpf   Bacteria, UA NONE SEEN NONE SEEN   Squamous Epithelial / LPF 0-5 0 - 5   Mucus PRESENT     Comment: Performed at [facility], [address] [place], [place] [address]  Urine Drug Screen, Qualitative ([facility] only)     Status: None   Collection Time: 03/29/22  4:54 PM  Result Value Ref Range   Tricyclic, Ur Screen NONE DETECTED NONE DETECTED   Amphetamines, Ur Screen NONE DETECTED NONE DETECTED   MDMA (Ecstasy)Ur Screen NONE DETECTED NONE DETECTED   Cocaine Metabolite,Ur [place] NONE DETECTED NONE DETECTED   Opiate, Ur Screen NONE DETECTED NONE DETECTED   Phencyclidine (PCP) Ur S NONE DETECTED NONE DETECTED   Cannabinoid 50 Ng, Ur [place] NONE DETECTED NONE DETECTED   Barbiturates, Ur Screen NONE DETECTED NONE DETECTED   Benzodiazepine, Ur Scrn NONE DETECTED NONE DETECTED   Methadone Scn, Ur NONE DETECTED NONE DETECTED    Comment: (NOTE) Tricyclics + metabolites, urine    Cutoff 1000 ng/mL  Amphetamines + metabolites, urine  Cutoff 1000 ng/mL MDMA (Ecstasy), urine              Cutoff 500 ng/mL Cocaine Metabolite, urine          Cutoff 300 ng/mL Opiate + metabolites, urine        Cutoff 300 ng/mL Phencyclidine  (PCP), urine         Cutoff 25 ng/mL Cannabinoid, urine                 Cutoff 50 ng/mL Barbiturates + metabolites, urine  Cutoff 200 ng/mL Benzodiazepine, urine              Cutoff 200 ng/mL Methadone, urine                   Cutoff 300 ng/mL  The urine drug screen provides only a preliminary, unconfirmed analytical test result and should not be used for non-medical purposes. Clinical consideration and professional judgment should be applied to any positive drug screen result due to possible interfering substances. A more specific alternate chemical method must be used in order to obtain a confirmed analytical result. Gas chromatography / mass spectrometry (GC/MS) is the preferred confirm atory method. Performed at [facility], [address] [place], [place] [address]     CT HEAD WO CONTRAST (5MM)  Result Date: 03/29/2022 CLINICAL DATA:  Stroke, follow up EXAM: CT HEAD WITHOUT CONTRAST TECHNIQUE: Contiguous axial images were obtained from the base of the skull through the vertex without intravenous contrast. RADIATION DOSE REDUCTION: This exam was performed according to the departmental dose-optimization program which includes automated exposure control, adjustment of the mA and/or kV according to patient size and/or use of iterative reconstruction technique. COMPARISON:  CT head 03/29/2022 4:06 p.m. FINDINGS: Brain: No evidence of large-territorial acute infarction. Interval increase in size of a now 6 x 4.4 x 4.8 cm (from 4.8 x 4.4 x 4.7 cm) left frontotemporal intraparenchymal hemorrhage. Interval development of new foci intraparenchymal hemorrhage within the left uncus/medial temporal lobe. Intraventricular hemorrhage extension. No subarachnoid or subdural hematoma. Stable to minimally increased 7 mm (from 5-6 mm) left to right midline shift. Persistent impending left uncal herniation with encroachment of the left ambient cistern. No hydrocephalus. Vascular: No hyperdense  vessel. Skull: No acute fracture or focal lesion. Sinuses/Orbits: Paranasal sinuses and mastoid air cells are clear. The orbits are unremarkable. Other: None. IMPRESSION: Interval increase in size of a 6 x 4.4 x 4.8 cm left frontotemporal intraparenchymal hemorrhage. Interval development of new foci intraparenchymal hemorrhage within the left uncus/medial temporal lobe. Stable to minimally increased 7 mm (from 5-6 mm) left to right midline shift. Persistent impending left uncal herniation with encroachment on the left ambient cistern. Redemonstration of intraventricular hemorrhage. These results were called by telephone at the time of interpretation on 03/29/2022 at 6:09 pm to provider Dr Cheral Marker, who verbally acknowledged these results. Electronically Signed   By: Iven Finn M.D.   On: 03/29/2022 18:15   DG Chest Portable 1 View  Result Date: 03/29/2022 CLINICAL DATA:  Shortness of breath. EXAM: PORTABLE CHEST 1 VIEW COMPARISON:  Chest radiograph dated 10/09/2021. FINDINGS: The heart size and mediastinal contours are within normal limits. Vascular calcifications are seen in the aortic arch. Both lungs are clear. The visualized skeletal structures are unremarkable. IMPRESSION: No active disease. Aortic Atherosclerosis (ICD10-I70.0). Electronically Signed   By: Zerita Boers M.D.   On: 03/29/2022 17:12   CT HEAD WO CONTRAST (5MM)  Result Date:  03/29/2022 CLINICAL DATA:  Patient to ED via [facility] from work for altered mental status. Patient was found by coworkers in bathroom sitting by toilet. No vomit noted. Patient not wanting to answer questions or follow commands. Hx of COPD and a heavy smoker EXAM: CT HEAD WITHOUT CONTRAST CT CERVICAL SPINE WITHOUT CONTRAST TECHNIQUE: Multidetector CT imaging of the head and cervical spine was performed following the standard protocol without intravenous contrast. Multiplanar CT image reconstructions of the cervical spine were also generated. RADIATION DOSE REDUCTION:  This exam was performed according to the departmental dose-optimization program which includes automated exposure control, adjustment of the mA and/or kV according to patient size and/or use of iterative reconstruction technique. COMPARISON:  CT head 11/16/2008 FINDINGS: CT HEAD FINDINGS Brain: No evidence of large-territorial acute infarction. At least 5 x 4.5 x 4 cm intraparenchymal hemorrhage centered within the left anterior temporal and frontal lobes. Extension of the hemorrhage into the lateral, third, fourth ventricles with no definite associated hydrocephalus. Mild associated surrounding vasogenic edema. Underlying mass lesion not excluded. No definite subdural or subarachnoid hemorrhage identified. Associated 5 mm left-to-right midline shift. Impending uncal herniation on the left. Basilar cisterns are patent. Vascular: No hyperdense vessel. Skull: No acute fracture or focal lesion. Sinuses/Orbits: Paranasal sinuses and mastoid air cells are clear. The orbits are unremarkable. Other: None. CT CERVICAL SPINE FINDINGS Alignment: Normal. Skull base and vertebrae: Multilevel degenerative changes of the spine. No acute fracture. No aggressive appearing focal osseous lesion or focal pathologic process. Soft tissues and spinal canal: No prevertebral fluid or swelling. No visible canal hematoma. Upper chest: Paraseptal and centrilobular emphysematous changes. Biapical pleural/pulmonary scarring. Other: None. IMPRESSION: 1. At least 5 x 4.5 x 4 cm intraparenchymal hemorrhage centered within the left anterior temporal and frontal lobes. Associated intraventricular extension of the hemorrhage. Associated 5 mm left-to-right midline shift. Impending uncal herniation on the left. Underlying mass lesion not excluded. 2. No acute displaced fracture or traumatic listhesis of the cervical spine. 3.  Emphysema (ICD10-J43.9). These results were called by telephone at the time of interpretation on 03/29/2022 at 4:21 pm to  provider [facility] , who verbally acknowledged these results. Electronically Signed   By: Iven Finn M.D.   On: 03/29/2022 16:23   CT Cervical Spine Wo Contrast  Result Date: 03/29/2022 CLINICAL DATA:  Patient to ED via [facility] from work for altered mental status. Patient was found by coworkers in bathroom sitting by toilet. No vomit noted. Patient not wanting to answer questions or follow commands. Hx of COPD and a heavy smoker EXAM: CT HEAD WITHOUT CONTRAST CT CERVICAL SPINE WITHOUT CONTRAST TECHNIQUE: Multidetector CT imaging of the head and cervical spine was performed following the standard protocol without intravenous contrast. Multiplanar CT image reconstructions of the cervical spine were also generated. RADIATION DOSE REDUCTION: This exam was performed according to the departmental dose-optimization program which includes automated exposure control, adjustment of the mA and/or kV according to patient size and/or use of iterative reconstruction technique. COMPARISON:  CT head 11/16/2008 FINDINGS: CT HEAD FINDINGS Brain: No evidence of large-territorial acute infarction. At least 5 x 4.5 x 4 cm intraparenchymal hemorrhage centered within the left anterior temporal and frontal lobes. Extension of the hemorrhage into the lateral, third, fourth ventricles with no definite associated hydrocephalus. Mild associated surrounding vasogenic edema. Underlying mass lesion not excluded. No definite subdural or subarachnoid hemorrhage identified. Associated 5 mm left-to-right midline shift. Impending uncal herniation on the left. Basilar cisterns are patent. Vascular: No hyperdense vessel. Skull:  No acute fracture or focal lesion. Sinuses/Orbits: Paranasal sinuses and mastoid air cells are clear. The orbits are unremarkable. Other: None. CT CERVICAL SPINE FINDINGS Alignment: Normal. Skull base and vertebrae: Multilevel degenerative changes of the spine. No acute fracture. No aggressive appearing focal osseous  lesion or focal pathologic process. Soft tissues and spinal canal: No prevertebral fluid or swelling. No visible canal hematoma. Upper chest: Paraseptal and centrilobular emphysematous changes. Biapical pleural/pulmonary scarring. Other: None. IMPRESSION: 1. At least 5 x 4.5 x 4 cm intraparenchymal hemorrhage centered within the left anterior temporal and frontal lobes. Associated intraventricular extension of the hemorrhage. Associated 5 mm left-to-right midline shift. Impending uncal herniation on the left. Underlying mass lesion not excluded. 2. No acute displaced fracture or traumatic listhesis of the cervical spine. 3.  Emphysema (ICD10-J43.9). These results were called by telephone at the time of interpretation on 03/29/2022 at 4:21 pm to provider [facility] , who verbally acknowledged these results. Electronically Signed   By: Iven Finn M.D.   On: 03/29/2022 16:23    ROS: Unobtainable Blood pressure (!) 135/53, pulse 94, temperature 99.7 F (37.6 C), resp. rate 17, SpO2 100 %. Estimated body mass index is 23.73 kg/m as calculated from the following:   Height as of 12/03/19: '5\' 6"'$  (1.676 m).   Weight as of an earlier encounter on 03/29/22: 66.7 kg.  Physical Exam  General: Somnolent but easily arousable 66 year old confused and mildly combative white female  HEENT: Normocephalic.  She will not let me open her eyes to see her pupils.  Neck: Unremarkable  Thorax: Symmetric  Abdomen: Soft  Extremities: Unremarkable  Neurologic exam: Glasgow Coma Scale 11, E3M5V3.  She moves all 4 extremities well.  He may be a bit weak on the left.  She does not follow commands and appears a bit aphasic.  She will mumble some discernible phrases and words.  Imaging studies: I have reviewed the patient's head CTs performed at [facility] today.  Patient has a left temporal intracerebral hemorrhage with some mass effect.  She has blood casting the third ventricle, fourth ventricle, aqueduct and left lateral  ventricle with moderate hydrocephalus.  Assessment/Plan: Left temporal hemorrhage, interventricular hemorrhage, hydrocephalus: I discussed the situation with the patient's family.  We discussed the various treatment options.  At this point I do not recommend evacuation of her temporal hemorrhage because of afraid it would leave her aphasic.  I suggested medical management of the stroke/hemorrhage.  We discussed hydrocephalus.  I have recommended placement of a right frontal ventriculostomy to treat her hydrocephalus.  I described the procedure and the risks including risk of hemorrhage, infection, ventriculostomy placement or malfunction, etc.  I have answered all their questions.  The patient's son has consented on behalf of the patient.  Ophelia Charter 03/29/2022, 8:49 PM

## 2022-03-29 NOTE — ED Triage Notes (Signed)
Patient to ED via [facility] from work for altered mental status. Patient was found by coworkers in bathroom sitting by toilet. No vomit noted. Patient not wanting to answer questions or follow commands. Hx of COPD and a heavy smoker per EMS.

## 2022-03-29 NOTE — Progress Notes (Signed)
An USGPIV (ultrasound guided PIV) has been placed for short-term vasopressor infusion. A correctly placed ivWatch must be used when administering Vasopressors. Should this treatment be needed beyond 72 hours, central line access should be obtained.  It will be the responsibility of the bedside nurse to follow best practice to prevent extravasations.   ?

## 2022-03-29 NOTE — H&P (Signed)
Neurology H&P - ICH   CC: Headache, altered mental status, ICH transferred from [facility]  History is obtained from: Chart, son  HPI: [name] Giles is a 66 y.o. female past medical history of COPD, tobacco abuse and asthma presenting after being found on the bathroom at work today.  She has been complaining of headache all day today and went into the bathroom and had vomiting for about an hour.  After that she was noted to be less responsive, not being able to follow commands.  EMS was called.  She was brought into the ED and head CT revealed a large left frontotemporal ICH with IVH and 5 mm midline shift.  Patient was transferred to [facility], ICU for higher level of care after being accepted to our service. Patient never provide any history. Son at bedside who corroborates the history above.  In the ICU, the patient remains on Cleviprex with blood pressures under the parameters for ICH.  She has had increasing oxygen requirement. A repeat head CT was done at the outside hospital an hour or so after the first CAT scan which showed new areas of ICH within the left ankle/medial temporal lobe and minimally increased midline shift.  Although the official report does not mention any hydrocephalus, there is definite ballooning of the temporal horns concerning for obstructive hydrocephalus.  LKW: Unclear IV thrombolysis given?: no, ICH Premorbid modified Rankin scale (mRS): 0 0-Completely asymptomatic and back to baseline post-stroke 1-No significant post stroke disability and can perform usual duties with stroke symptoms 2-Slight disability-UNABLE to perform all activities but does not need assistance  3-Moderate disability-requires help but walks WITHOUT assistance 4-Needs assistance to walk and tend to bodily needs 5-Severe disability-bedridden, incontinent, needs constant attention 6- Death  ICH Score: 3   ROS: Unable to obtain due to altered mental status.   Past  Medical History:  Diagnosis Date   Asthma    Medical history non-contributory      Family History  Adopted: Yes  Problem Relation Age of Onset   Breast cancer Mother    Hypertension Brother    Diabetes Brother    Hypertension Brother    Colon cancer Neg Hx      Social History:   reports that she has been smoking cigarettes. She has a 30.00 pack-year smoking history. She has never used smokeless tobacco. She reports current alcohol use. She reports that she does not use drugs.  Medications  Current Facility-Administered Medications:    [START ON 03/30/2022]  stroke: early stages of recovery book, , Does not apply, Once, Amie Portland, MD   acetaminophen (TYLENOL) tablet 650 mg, 650 mg, Oral, Q4H PRN **OR** acetaminophen (TYLENOL) 160 MG/5ML solution 650 mg, 650 mg, Per Tube, Q4H PRN **OR** acetaminophen (TYLENOL) suppository 650 mg, 650 mg, Rectal, Q4H PRN, Amie Portland, MD   clevidipine (CLEVIPREX) 0.5 MG/ML infusion, , , ,    labetalol (NORMODYNE) injection 20 mg, 20 mg, Intravenous, Once **AND** clevidipine (CLEVIPREX) infusion 0.5 mg/mL, 0-21 mg/hr, Intravenous, Continuous, Amie Portland, MD   pantoprazole (PROTONIX) injection 40 mg, 40 mg, Intravenous, QHS, Amie Portland, MD   senna-docusate (Senokot-S) tablet 1 tablet, 1 tablet, Oral, BID, Amie Portland, MD   sodium chloride (hypertonic) 3 % solution, , Intravenous, Continuous, Amie Portland, MD   Exam: Current vital signs: BP (!) 132/51   Pulse 85   Temp 99.7 F (37.6 C)   Resp 17   SpO2 99%  Vital signs in last 24 hours: Temp:  [  97.8 F (36.6 C)-99.7 F (37.6 C)] 99.7 F (37.6 C) (08/10 1915) Pulse Rate:  [58-102] 85 (08/10 1915) Resp:  [14-19] 17 (08/10 1915) BP: (115-163)/(51-112) 132/51 (08/10 1915) SpO2:  [79 %-100 %] 99 % (08/10 1915) Weight:  [66.7 kg] 66.7 kg (08/10 1536) General: Patient drowsy, opens eyes to voice. HNT: Normocephalic atraumatic CVs: Regular rhythm Respiratory: On 5 L oxygen via  nasal cannula, scattered rales Abdomen nondistended nontender Neurological exam She is drowsy, opens eyes to voice Does not follow any commands She is nonverbal Cranial nerves: Pupils are equal round react light, she does not have a gaze preference or deviation but does not seem to be able to cross the midline to the right, does not blink to threat consistently from the right, face appears grossly symmetric. Motor examination with antigravity strength in the left but definitive weakness and less than antigravity strength in the right upper and lower extremities. Sensation: Weaker withdrawal on the right in comparison to the left lower stimulation Coordination cannot be assessed due to mentation NIHSS 1a Level of Conscious.: 1 1b LOC Questions: 2 1c LOC Commands: 2 2 Best Gaze: 0 3 Visual: 1 4 Facial Palsy: 0 5a Motor Arm - left: 1 5b Motor Arm - Right: 4 6a Motor Leg - Left: 1 6b Motor Leg - Right: 2 7 Limb Ataxia: 0 8 Sensory: 1 9 Best Language: 3 10 Dysarthria: 2 11 Extinct. and Inatten.: 0 TOTAL: 20   Labs I have reviewed labs in epic and the results pertinent to this consultation are: CBC    Component Value Date/Time   WBC 9.3 03/29/2022 1533   RBC 4.89 03/29/2022 1533   HGB 14.5 03/29/2022 1533   HGB 14.2 05/18/2014 1239   HCT 44.8 03/29/2022 1533   HCT 43.5 05/18/2014 1239   PLT 207 03/29/2022 1533   PLT 269 05/18/2014 1239   MCV 91.6 03/29/2022 1533   MCV 93 05/18/2014 1239   MCH 29.7 03/29/2022 1533   MCHC 32.4 03/29/2022 1533   RDW 13.4 03/29/2022 1533   RDW 13.3 05/18/2014 1239   LYMPHSABS 2.9 03/29/2022 1533   MONOABS 0.6 03/29/2022 1533   EOSABS 0.2 03/29/2022 1533   BASOSABS 0.0 03/29/2022 1533    CMP     Component Value Date/Time   NA 140 03/29/2022 1538   NA 139 05/18/2014 1239   K 4.4 12/03/2019 1417   K 4.0 05/18/2014 1239   CL 102 12/03/2019 1417   CL 105 05/18/2014 1239   CO2 28 12/03/2019 1417   CO2 28 05/18/2014 1239   GLUCOSE 105  (H) 12/03/2019 1417   GLUCOSE 87 05/18/2014 1239   BUN 17 12/03/2019 1417   BUN 9 05/18/2014 1239   CREATININE 0.71 12/03/2019 1417   CREATININE 0.70 05/18/2014 1239   CALCIUM 9.3 12/03/2019 1417   CALCIUM 9.2 05/18/2014 1239   GFRNONAA >60 12/03/2019 1417   GFRNONAA >60 05/18/2014 1239   GFRAA >60 12/03/2019 1417   GFRAA >60 05/18/2014 1239     Imaging I have reviewed the images obtained: CT head and C-spine 1611 hrs IMPRESSION: 1. At least 5 x 4.5 x 4 cm intraparenchymal hemorrhage centered within the left anterior temporal and frontal lobes. Associated intraventricular extension of the hemorrhage. Associated 5 mm left-to-right midline shift. Impending uncal herniation on the left. Underlying mass lesion not excluded. 2. No acute displaced fracture or traumatic listhesis of the cervical spine. 3.  Emphysema (ICD10-J43.9).   CT-head 1750 hrs IMPRESSION: Interval increase in  size of a 6 x 4.4 x 4.8 cm left frontotemporal intraparenchymal hemorrhage. Interval development of new foci intraparenchymal hemorrhage within the left uncus/medial temporal lobe. Stable to minimally increased 7 mm (from 5-6 mm) left to right midline shift. Persistent impending left uncal herniation with encroachment on the left ambient cistern. Redemonstration of intraventricular hemorrhage.  Assessment:  66 year old with above past medical history presenting for evaluation of right-sided weakness, confusion, lethargy aphasia along with headache and vomiting noted to have a approximately 45 cc intraparenchymal hemorrhage in the left anterior temporal and frontal lobe with intraventricular extension and a repeat head CT in about an hour and a half showing possible new foci of ICH and somewhat worsened midline shift and early obstructive hydrocephalus. Etiology likely hypertensive hemorrhage.  Plan: Subcortical ICH, nontraumatic Cortical ICH, nontraumatic IVH, nontraumatic  Acuity:  Acute Laterality: left Current suspected etiology:   Hypertensive Treatment: -Admit to neurological ICU -ICH Score: 3 -ICH Volume: Initially 45 cc approximately, repeat scan with somewhat increased size -BP control goal SYS 130-150 -Consult neurosurgery stat-spoke with Dr. Arnoldo Morale.  Discussed EVD placement which she agreed to.  Also discussed hematoma decompression-does not feel would be beneficial given the involvement of the dominant temporal lobe.  Appreciate consultation. -PT/OT/ST  -neuromonitoring -MRI brain with and without contrast -CT angiography head and neck  CNS Cerebral edema Compression of brain -Hyperosmolar therapy-on 3%-started at the outside hospital.  Continue now with goal sodium 150-155 -NSGY consult-Dr. Arnoldo Morale consulting -Close neuro monitoring  Obstructive hydrocephalus -EVD per NSGY -monitor per protocol.  Dysarthria Dysphagia following ICH  -NPO until cleared by speech   Hemiplegia and hemiparesis following nontraumatic intracerebral hemorrhage affecting right dominant side  -Continue PT/OT/ST  RESP Possible aspiration pneumonia -Increasing requirement for supplemental oxygen. -Obtain chest x-ray to rule out possible aspiration pneumonia -Consider CCM consult if maintaining airway becomes difficult  CV Hypertensive Encephalopathy Hypertensive Emergency -Aggressive BP control, goal SBP as above -Cleviprex drip  Transthoracic echo  GI/GU No active issues Gentle hydration with normal saline 75 cc an hour  HEME No active issues PT/INR not checked-ordered-follow-up results No anticoagulants at home No evidence of thrombocytopenia   ENDO No active issues Check CMP again in the morning  Fluid/Electrolyte Disorders Check BMP/CMP in the morning-replete electrolytes as necessary  ID Possible Aspiration PNA -CXR -NPO -Monitor -Hold off antibiotics for now  Prophylaxis DVT: SCDs only-no antiplatelets or anticoagulants GI:  PPI Bowel: Docusate senna  Dispo: To be determined  Diet: NPO until cleared by speech  Code Status: Full Code     THE FOLLOWING WERE PRESENT ON ADMISSION: Hypertensive encephalopathy, hypertensive emergency, intracerebral hemorrhage, intraventricular hemorrhage, cerebral edema, obstructive hydrocephalus, COPD  -- Amie Portland, MD Neurologist [facility] Pager: [phone]   CRITICAL CARE ATTESTATION Performed by: Amie Portland, MD Total critical care time: 55 minutes Critical care time was exclusive of separately billable procedures and treating other patients and/or supervising APPs/Residents/Students Critical care was necessary to treat or prevent imminent or life-threatening deterioration due to [place], IVH, hypertensive encephalopathy, hypertensive emergency This patient is critically ill and at significant risk for neurological worsening and/or death and care requires constant monitoring. Critical care was time spent personally by me on the following activities: development of treatment plan with patient and/or surrogate as well as nursing, discussions with consultants, evaluation of patient's response to treatment, examination of patient, obtaining history from patient or surrogate, ordering and performing treatments and interventions, ordering and review of laboratory studies, ordering and review of radiographic studies, pulse oximetry, re-evaluation  of patient's condition, participation in multidisciplinary rounds and medical decision making of high complexity in the care of this patient.

## 2022-03-29 NOTE — Op Note (Signed)
Brief history: The patient is a 66 year old white female who presented with a left temporal hemorrhage and interventricular hemorrhage, and hydrocephalus.  I discussed the treatment options with the patient and family.  They have elected to proceed with a placement of image ventriculostomy.  Preop diagnosis: Left temporal hemorrhage, interventricular hemorrhage, hydrocephalus  Postop diagnosis: The same  Procedure: Placement of right frontal ventriculostomy via bur hole  Surgeon: Dr. Earle Gell Assistant: Arnetha Massy, NP  Anesthesia: Local  Estimated blood loss: 75 cc  Specimens: None  Drains: Ventriculostomy  Complications: None  Description of procedure: The patient's right scalp was shaved with clippers and prepared with DuraPrep and Betadine.  Sterile drapes were applied.  The area to be incised was injected with Marcaine with epinephrine solution.  I used a scalpel to make a incision approximately at the coronal suture intermittent right mid pupillary line.  I used a self-retaining retractor for exposure.  I used a drill to create a right frontal bur hole.  We incised the dura with a scalpel.  I then cannulated the patient's ventricular system with a ventricular catheter.  I then tunneled the catheter under the scalp.  We secured the catheter in place with sutures.  We connected the catheter to the drainage system.  There was good flow of CSF under relatively low pressure.  I reapproximated the patient's right frontal incision with a running 3-0 nylon suture.  Sterile dressing was applied.  The drapes were removed.  The patient tolerated the procedure well.

## 2022-03-29 NOTE — ED Notes (Signed)
[facility]ALA and all required documentation is up to date and reviewed by this Agricultural consultant. Pt is ready for transport.

## 2022-03-30 ENCOUNTER — Encounter (HOSPITAL_COMMUNITY): Payer: Self-pay | Admitting: Student in an Organized Health Care Education/Training Program

## 2022-03-30 ENCOUNTER — Inpatient Hospital Stay (HOSPITAL_COMMUNITY): Payer: 59

## 2022-03-30 ENCOUNTER — Inpatient Hospital Stay: Payer: Self-pay

## 2022-03-30 DIAGNOSIS — G936 Cerebral edema: Secondary | ICD-10-CM

## 2022-03-30 DIAGNOSIS — Q282 Arteriovenous malformation of cerebral vessels: Secondary | ICD-10-CM

## 2022-03-30 DIAGNOSIS — G911 Obstructive hydrocephalus: Secondary | ICD-10-CM

## 2022-03-30 DIAGNOSIS — I160 Hypertensive urgency: Secondary | ICD-10-CM | POA: Diagnosis not present

## 2022-03-30 DIAGNOSIS — I6389 Other cerebral infarction: Secondary | ICD-10-CM | POA: Diagnosis not present

## 2022-03-30 DIAGNOSIS — I611 Nontraumatic intracerebral hemorrhage in hemisphere, cortical: Secondary | ICD-10-CM | POA: Diagnosis not present

## 2022-03-30 LAB — BASIC METABOLIC PANEL
Anion gap: 3 — ABNORMAL LOW (ref 5–15)
BUN: 7 mg/dL — ABNORMAL LOW (ref 8–23)
CO2: 24 mmol/L (ref 22–32)
Calcium: 8.3 mg/dL — ABNORMAL LOW (ref 8.9–10.3)
Chloride: 121 mmol/L — ABNORMAL HIGH (ref 98–111)
Creatinine, Ser: 0.55 mg/dL (ref 0.44–1.00)
GFR, Estimated: >60 mL/min (ref >60)
Glucose, Bld: 121 mg/dL — ABNORMAL HIGH (ref 70–99)
Potassium: 4 mmol/L (ref 3.5–5.1)
Sodium: 148 mmol/L — ABNORMAL HIGH (ref 135–145)

## 2022-03-30 LAB — ECHOCARDIOGRAM COMPLETE
Area-P 1/2: 3.77 cm2
Height: 65 in
S' Lateral: 3 cm
Weight: 2335.11 oz

## 2022-03-30 LAB — PROTIME-INR
INR: 1 (ref 0.8–1.2)
Prothrombin Time: 13 seconds (ref 11.4–15.2)

## 2022-03-30 LAB — SODIUM
Sodium: 144 mmol/L (ref 135–145)
Sodium: 152 mmol/L — ABNORMAL HIGH (ref 135–145)
Sodium: 151 mmol/L — ABNORMAL HIGH (ref 135–145)

## 2022-03-30 MED ORDER — SODIUM CHLORIDE 0.9% FLUSH
10.0000 mL | INTRAVENOUS | Status: DC | PRN
  Administered 2022-04-03: 20 mL

## 2022-03-30 MED ORDER — AMLODIPINE BESYLATE 10 MG PO TABS
10.0000 mg | ORAL_TABLET | Freq: Every day | ORAL | Status: DC
Start: 2022-03-30 — End: 2022-04-07
  Administered 2022-03-30 – 2022-04-05 (×7): 10 mg
  Filled 2022-03-30 (×7): qty 1

## 2022-03-30 MED ORDER — IOHEXOL 350 MG/ML SOLN
75.0000 mL | Freq: Once | INTRAVENOUS | Status: AC | PRN
  Administered 2022-03-30: 75 mL via INTRAVENOUS

## 2022-03-30 MED ORDER — PANTOPRAZOLE 2 MG/ML SUSPENSION
40.0000 mg | Freq: Every day | ORAL | Status: DC
  Administered 2022-03-30 – 2022-04-01 (×3): 40 mg
  Filled 2022-03-30 (×3): qty 20

## 2022-03-30 MED ORDER — DEXMEDETOMIDINE HCL IN NACL 400 MCG/100ML IV SOLN
0.4000 ug/kg/h | INTRAVENOUS | Status: DC
  Administered 2022-03-30: 0.4 ug/kg/h via INTRAVENOUS
  Administered 2022-03-30: 0.6 ug/kg/h via INTRAVENOUS
  Administered 2022-03-31 – 2022-04-01 (×2): 0.4 ug/kg/h via INTRAVENOUS
  Administered 2022-04-01 – 2022-04-02 (×2): 0.6 ug/kg/h via INTRAVENOUS
  Filled 2022-03-30 (×7): qty 100

## 2022-03-30 MED ORDER — SODIUM CHLORIDE 0.9% FLUSH
10.0000 mL | Freq: Two times a day (BID) | INTRAVENOUS | Status: DC
  Administered 2022-03-30: 40 mL
  Administered 2022-03-30 – 2022-03-31 (×2): 10 mL
  Administered 2022-03-31: 20 mL
  Administered 2022-04-01 – 2022-04-04 (×6): 10 mL
  Administered 2022-04-04: 20 mL
  Administered 2022-04-05 – 2022-04-07 (×5): 10 mL
  Administered 2022-04-08: 3 mL
  Administered 2022-04-09 – 2022-04-16 (×9): 10 mL

## 2022-03-30 MED ORDER — LISINOPRIL 20 MG PO TABS
20.0000 mg | ORAL_TABLET | Freq: Every day | ORAL | Status: DC
  Administered 2022-03-30 – 2022-04-05 (×7): 20 mg
  Filled 2022-03-30 (×7): qty 1

## 2022-03-30 NOTE — Progress Notes (Signed)
  Transition of Care [facility]) Screening Note   Patient Details  Name: ZARIN HAGMANN Date of Birth: 10/13/1955   Transition of Care [facility]) CM/SW Contact:    Benard Halsted, LCSW Phone Number: 03/30/2022, 9:02 AM    Transition of Care Department [facility]) has reviewed patient currently with EVD. We will continue to monitor patient advancement through interdisciplinary progression rounds. If new patient transition needs arise, please place a TOC consult.

## 2022-03-30 NOTE — Progress Notes (Signed)
0815- Patient taken down for head CT angio at with RN Delfin Gant. 66 G IV placed in R posterior FA for angio access. Patient returned to unit with moderate HTN. Cleviprex restarted and titrated up.   [facility] called with critical result. Dr. Erlinda Hong notified face to face.

## 2022-03-30 NOTE — Progress Notes (Signed)
Subjective: The patient is sedated but arousable.  She is in no apparent distress.  Her son is at the bedside.  Objective: Vital signs in last 24 hours: Temp:  [97.8 F (36.6 C)-100.2 F (37.9 C)] 99.7 F (37.6 C) (08/11 0500) Pulse Rate:  [58-102] 68 (08/11 0500) Resp:  [14-21] 16 (08/11 0500) BP: (89-163)/(48-112) 129/52 (08/11 0500) SpO2:  [79 %-100 %] 98 % (08/11 0500) Weight:  [66.2 kg-66.7 kg] 66.2 kg (08/10 2000) Estimated body mass index is 24.29 kg/m as calculated from the following:   Height as of this encounter: '5\' 5"'$  (1.651 m).   Weight as of this encounter: 66.2 kg.   Intake/Output from previous day: 08/10 0701 - 08/11 0700 In: 784.7 [I.V.:784.7] Out: 33 [Drains:33] Intake/Output this shift: Total I/O In: 784.7 [I.V.:784.7] Out: 33 [Drains:33]  Physical exam the patient is sedated but arousable.  She will say some words.  She is moving all 4 extremities.  Her pupils are equal.  Her ventriculostomy is patent.  Lab Results: Recent Labs    03/29/22 1533  WBC 9.3  HGB 14.5  HCT 44.8  PLT 207   BMET Recent Labs    03/29/22 2040 03/30/22 0209  NA 139 144    Studies/Results: MR BRAIN W WO CONTRAST  Result Date: 03/30/2022 CLINICAL DATA:  Initial evaluation for neuro deficit, stroke suspected, intracranial hemorrhage. EXAM: MRI HEAD WITHOUT AND WITH CONTRAST TECHNIQUE: Multiplanar, multiecho pulse sequences of the brain and surrounding structures were obtained without and with intravenous contrast. CONTRAST:  6.53m GADAVIST GADOBUTROL 1 MMOL/ML IV SOLN COMPARISON:  Prior CT from 03/29/2022. FINDINGS: Brain: Examination moderately degraded by motion artifact. Previously identified large intraparenchymal hemorrhage centered at the left temporal lobe again seen, measuring approximately 5.9 x 4.6 x 5.0 cm (estimated volume 68 mL). This is similar in size from prior. Surrounding vasogenic edema with regional mass effect and up to 1 cm of left-to-right shift. This  has progressed from prior. Secondary crowding of the basilar cisterns without transtentorial herniation. Following contrast administration, there is an irregular heterogeneous area of postcontrast enhancement along the lateral margin of the hemorrhage, measuring up to 1.4 cm in size (series 18, image 18). Some of this enhancement appears to potentially be contiguous with overlying cortical veins (series 18, image 17). Additionally, there is question of a few small vascular flow voids within this area (series 15, image 10). Few small parenchymal calcifications seen within this region on prior head CT. Findings raise the possibility for an underlying vascular lesion, although difficult to be certain given the overlying hemorrhage and motion artifact on this exam. Possible mass not entirely excluded. Intraventricular extension with moderate to large volume blood within the left greater than right lateral ventricles, third ventricle, and fourth ventricle. There has been interval placement of a right frontal approach ventriculostomy with tip at the septum pellucidum. The right lateral ventricle is decompressed from prior exam. Ventriculomegaly involving the left lateral ventricle is relatively stable, with probable small amount of transependymal flow of CSF about the atrium. Third and fourth ventricles remain fairly normal in size. No other acute or chronic blood products seen elsewhere within the brain. No other evidence for acute or subacute infarct. Few small remote bilateral cerebellar infarcts noted. No other mass lesion or mass effect. No extra-axial collection. No other abnormal enhancement. Vascular: Major intracranial vascular flow voids are maintained. Mass effect on the left MCA due to the adjacent hemorrhage noted. Skull and upper cervical spine: Craniocervical junction within normal limits. Bone  marrow signal intensity normal. Soft tissue swelling at the right frontal scalp related to the ventriculostomy.  Sinuses/Orbits: Globes and orbital soft tissues within normal limits. Paranasal sinuses are largely clear. No mastoid effusion. Other: None. IMPRESSION: 1. 68 mL intraparenchymal hemorrhage centered at the left temporal lobe, similar in size as compared to prior CT. Surrounding vasogenic edema with regional mass effect, with worsened 1 cm of left-to-right shift. 2. Irregular heterogeneous area of enhancement along the lateral margin of the hemorrhage, nonspecific, but raises the possibility for an underlying vascular lesion. Further evaluation with dedicated CTA and/or catheter directed angiogram recommended for further evaluation. Possible mass not excluded, although is felt to be less likely. 3. 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 Interval placement of a right frontal approach ventriculostomy with decompression of the right lateral ventricle. Ventriculomegaly involving the left lateral ventricle is relatively unchanged. 4. Few small remote bilateral cerebellar infarcts. Case discussed by telephone at the time of interpretation on 03/30/2022 at 5:08 am with provider ASHISH ARORA. Electronically Signed   By: Jeannine Boga M.D.   On: 03/30/2022 05:12   DG Chest 1 View  Result Date: 03/29/2022 CLINICAL DATA:  Dyspnea. EXAM: CHEST  1 VIEW COMPARISON:  Radiograph earlier today. FINDINGS: Heart is normal in size. Normal mediastinal contours, aortic atherosclerosis. Probable calcified lymph nodes in the left hilum. There is peribronchial thickening. No acute airspace disease, pleural effusion, pulmonary edema or pneumothorax. Stable osseous structures. IMPRESSION: Peribronchial thickening likely related smoking. No localizing process. Electronically Signed   By: Keith Rake M.D.   On: 03/29/2022 23:08   CT HEAD WO CONTRAST (5MM)  Result Date: 03/29/2022 CLINICAL DATA:  Stroke, follow up EXAM: CT HEAD WITHOUT CONTRAST TECHNIQUE: Contiguous axial images were  obtained from the base of the skull through the vertex without intravenous contrast. RADIATION DOSE REDUCTION: This exam was performed according to the departmental dose-optimization program which includes automated exposure control, adjustment of the mA and/or kV according to patient size and/or use of iterative reconstruction technique. COMPARISON:  CT head 03/29/2022 4:06 p.m. FINDINGS: Brain: No evidence of large-territorial acute infarction. Interval increase in size of a now 6 x 4.4 x 4.8 cm (from 4.8 x 4.4 x 4.7 cm) left frontotemporal intraparenchymal hemorrhage. Interval development of new foci intraparenchymal hemorrhage within the left uncus/medial temporal lobe. Intraventricular hemorrhage extension. No subarachnoid or subdural hematoma. Stable to minimally increased 7 mm (from 5-6 mm) left to right midline shift. Persistent impending left uncal herniation with encroachment of the left ambient cistern. No hydrocephalus. Vascular: No hyperdense vessel. Skull: No acute fracture or focal lesion. Sinuses/Orbits: Paranasal sinuses and mastoid air cells are clear. The orbits are unremarkable. Other: None. IMPRESSION: Interval increase in size of a 6 x 4.4 x 4.8 cm left frontotemporal intraparenchymal hemorrhage. Interval development of new foci intraparenchymal hemorrhage within the left uncus/medial temporal lobe. Stable to minimally increased 7 mm (from 5-6 mm) left to right midline shift. Persistent impending left uncal herniation with encroachment on the left ambient cistern. Redemonstration of intraventricular hemorrhage. These results were called by telephone at the time of interpretation on 03/29/2022 at 6:09 pm to provider Dr Cheral Marker, who verbally acknowledged these results. Electronically Signed   By: Iven Finn M.D.   On: 03/29/2022 18:15   DG Chest Portable 1 View  Result Date: 03/29/2022 CLINICAL DATA:  Shortness of breath. EXAM: PORTABLE CHEST 1 VIEW COMPARISON:  Chest radiograph dated  10/09/2021. FINDINGS: The heart size and mediastinal contours are within  normal limits. Vascular calcifications are seen in the aortic arch. Both lungs are clear. The visualized skeletal structures are unremarkable. IMPRESSION: No active disease. Aortic Atherosclerosis (ICD10-I70.0). Electronically Signed   By: Zerita Boers M.D.   On: 03/29/2022 17:12   CT HEAD WO CONTRAST (5MM)  Result Date: 03/29/2022 CLINICAL DATA:  Patient to ED via [facility] from work for altered mental status. Patient was found by coworkers in bathroom sitting by toilet. No vomit noted. Patient not wanting to answer questions or follow commands. Hx of COPD and a heavy smoker EXAM: CT HEAD WITHOUT CONTRAST CT CERVICAL SPINE WITHOUT CONTRAST TECHNIQUE: Multidetector CT imaging of the head and cervical spine was performed following the standard protocol without intravenous contrast. Multiplanar CT image reconstructions of the cervical spine were also generated. RADIATION DOSE REDUCTION: This exam was performed according to the departmental dose-optimization program which includes automated exposure control, adjustment of the mA and/or kV according to patient size and/or use of iterative reconstruction technique. COMPARISON:  CT head 11/16/2008 FINDINGS: CT HEAD FINDINGS Brain: No evidence of large-territorial acute infarction. At least 5 x 4.5 x 4 cm intraparenchymal hemorrhage centered within the left anterior temporal and frontal lobes. Extension of the hemorrhage into the lateral, third, fourth ventricles with no definite associated hydrocephalus. Mild associated surrounding vasogenic edema. Underlying mass lesion not excluded. No definite subdural or subarachnoid hemorrhage identified. Associated 5 mm left-to-right midline shift. Impending uncal herniation on the left. Basilar cisterns are patent. Vascular: No hyperdense vessel. Skull: No acute fracture or focal lesion. Sinuses/Orbits: Paranasal sinuses and mastoid air cells are clear. The  orbits are unremarkable. Other: None. CT CERVICAL SPINE FINDINGS Alignment: Normal. Skull base and vertebrae: Multilevel degenerative changes of the spine. No acute fracture. No aggressive appearing focal osseous lesion or focal pathologic process. Soft tissues and spinal canal: No prevertebral fluid or swelling. No visible canal hematoma. Upper chest: Paraseptal and centrilobular emphysematous changes. Biapical pleural/pulmonary scarring. Other: None. IMPRESSION: 1. At least 5 x 4.5 x 4 cm intraparenchymal hemorrhage centered within the left anterior temporal and frontal lobes. Associated intraventricular extension of the hemorrhage. Associated 5 mm left-to-right midline shift. Impending uncal herniation on the left. Underlying mass lesion not excluded. 2. No acute displaced fracture or traumatic listhesis of the cervical spine. 3.  Emphysema (ICD10-J43.9). These results were called by telephone at the time of interpretation on 03/29/2022 at 4:21 pm to provider [facility] , who verbally acknowledged these results. Electronically Signed   By: Iven Finn M.D.   On: 03/29/2022 16:23   CT Cervical Spine Wo Contrast  Result Date: 03/29/2022 CLINICAL DATA:  Patient to ED via [facility] from work for altered mental status. Patient was found by coworkers in bathroom sitting by toilet. No vomit noted. Patient not wanting to answer questions or follow commands. Hx of COPD and a heavy smoker EXAM: CT HEAD WITHOUT CONTRAST CT CERVICAL SPINE WITHOUT CONTRAST TECHNIQUE: Multidetector CT imaging of the head and cervical spine was performed following the standard protocol without intravenous contrast. Multiplanar CT image reconstructions of the cervical spine were also generated. RADIATION DOSE REDUCTION: This exam was performed according to the departmental dose-optimization program which includes automated exposure control, adjustment of the mA and/or kV according to patient size and/or use of iterative reconstruction  technique. COMPARISON:  CT head 11/16/2008 FINDINGS: CT HEAD FINDINGS Brain: No evidence of large-territorial acute infarction. At least 5 x 4.5 x 4 cm intraparenchymal hemorrhage centered within the left anterior temporal and frontal lobes. Extension of  the hemorrhage into the lateral, third, fourth ventricles with no definite associated hydrocephalus. Mild associated surrounding vasogenic edema. Underlying mass lesion not excluded. No definite subdural or subarachnoid hemorrhage identified. Associated 5 mm left-to-right midline shift. Impending uncal herniation on the left. Basilar cisterns are patent. Vascular: No hyperdense vessel. Skull: No acute fracture or focal lesion. Sinuses/Orbits: Paranasal sinuses and mastoid air cells are clear. The orbits are unremarkable. Other: None. CT CERVICAL SPINE FINDINGS Alignment: Normal. Skull base and vertebrae: Multilevel degenerative changes of the spine. No acute fracture. No aggressive appearing focal osseous lesion or focal pathologic process. Soft tissues and spinal canal: No prevertebral fluid or swelling. No visible canal hematoma. Upper chest: Paraseptal and centrilobular emphysematous changes. Biapical pleural/pulmonary scarring. Other: None. IMPRESSION: 1. At least 5 x 4.5 x 4 cm intraparenchymal hemorrhage centered within the left anterior temporal and frontal lobes. Associated intraventricular extension of the hemorrhage. Associated 5 mm left-to-right midline shift. Impending uncal herniation on the left. Underlying mass lesion not excluded. 2. No acute displaced fracture or traumatic listhesis of the cervical spine. 3.  Emphysema (ICD10-J43.9). These results were called by telephone at the time of interpretation on 03/29/2022 at 4:21 pm to provider [facility] , who verbally acknowledged these results. Electronically Signed   By: Iven Finn M.D.   On: 03/29/2022 16:23    Assessment/Plan: Left temporal hemorrhage, hydrocephalus: The patient is stable.   Her ventriculostomy is patent.  We will continue medical management of her intracerebral hemorrhage/stroke.  I answered all her son's questions.  LOS: 1 day     Ophelia Charter 03/30/2022, 6:54 AM     Patient ID: [name] Giles, female   DOB: 10-10-1955, 66 y.o.   MRN: [id number]

## 2022-03-30 NOTE — Progress Notes (Signed)
Peripherally Inserted Central Catheter Placement  The IV Nurse has discussed with the patient and/or persons authorized to consent for the patient, the purpose of this procedure and the potential benefits and risks involved with this procedure.  The benefits include less needle sticks, lab draws from the catheter, and the patient may be discharged home with the catheter. Risks include, but not limited to, infection, bleeding, blood clot (thrombus formation), and puncture of an artery; nerve damage and irregular heartbeat and possibility to perform a PICC exchange if needed/ordered by physician.  Alternatives to this procedure were also discussed.  Bard Power PICC patient education guide, fact sheet on infection prevention and patient information card has been provided to patient /or left at bedside.    PICC Placement Documentation  PICC Double Lumen 90/30/09 Right Basilic 36 cm (Active)  Indication for Insertion or Continuance of Line Administration of hyperosmolar/irritating solutions (i.e. TPN, Vancomycin, etc.) 03/30/22 1100  Site Assessment Clean, Dry, Intact 03/30/22 1100  Lumen #1 Status Flushed;Blood return noted;Saline locked 03/30/22 1100  Lumen #2 Status Flushed;Blood return noted;Saline locked 03/30/22 1100  Dressing Type Transparent;Securing device 03/30/22 1100  Dressing Status Clean, Dry, Intact;Antimicrobial disc in place 03/30/22 1100  Dressing Change Due 04/06/22 03/30/22 1100       Jule Economy Horton 03/30/2022, 12:03 PM

## 2022-03-30 NOTE — Progress Notes (Signed)
BP goal is 130-150. Some readings below 130. OK if they remain mostly above 120.  RN also informed me that the pt is very agitated and it is interfering with keeping the EVD level.  Will attempt redirection but if not successful - will order precedex  -- Amie Portland, MD Neurologist [facility] Pager: [phone]

## 2022-03-30 NOTE — Procedures (Signed)
Cortrak  Person Inserting Tube:  [name] Giles, RD Tube Type:  Cortrak - 43 inches Tube Size:  10 Tube Location:  Left nare Secured by: Bridle Technique Used to Measure Tube Placement:  Marking at nare/corner of mouth Cortrak Secured At:  66 cm   Cortrak Tube Team Note:  Consult received to place a Cortrak feeding tube.   X-ray is required, abdominal x-ray has been ordered by the Cortrak team. Please confirm tube placement before using the Cortrak tube.   If the tube becomes dislodged please keep the tube and contact the Cortrak team at [email or web address] (password [email or web address]) for replacement.  If after hours and replacement cannot be delayed, place a NG tube and confirm placement with an abdominal x-ray.    Lockie Pares., RD, LDN, CNSC See AMiON for contact information

## 2022-03-30 NOTE — Progress Notes (Signed)
Initial Nutrition Assessment  DOCUMENTATION CODES:   Not applicable  INTERVENTION:   Once able recommend initiate tube feeding via Cortrak tube: Omsolite 1.5 at 50 ml/h (1200 ml per day) Prosource TF20 60 ml daily  Provides 1880 kcal, 95 gm protein, 912 ml free water daily   NUTRITION DIAGNOSIS:   Inadequate oral intake related to inability to eat as evidenced by NPO status.  GOAL:   Patient will meet greater than or equal to 90% of their needs  MONITOR:   TF tolerance  REASON FOR ASSESSMENT:    (cortrak tube)    ASSESSMENT:   Pt with PMH of COPD, current every day smoker admitted with ICH.   Spoke with Dr Erlinda Hong, pt may require neurosurgery, will keep NPO and will leave TF recommendations when ready to feed.   8/11 cortrak placed; tip gastric  Sister at bedside. She reports that pt lives with her son. Pt does cook but they eat out a lot. She is mobile without assistance. Sister thinks pt has gained 5-10 lb over last few years.   Medications reviewed and include: protonix, senokot-s  Hypertonic saline Cleviprex @ 4 ml/hr   Labs reviewed: Na 152 (on 3%)  EVD: 40 ml    NUTRITION - FOCUSED PHYSICAL EXAM:  Flowsheet Row Most Recent Value  Orbital Region No depletion  Upper Arm Region No depletion  Thoracic and Lumbar Region No depletion  Buccal Region No depletion  Temple Region No depletion  Clavicle Bone Region No depletion  Clavicle and Acromion Bone Region No depletion  Scapular Bone Region No depletion  Dorsal Hand No depletion  Patellar Region No depletion  Anterior Thigh Region No depletion  Posterior Calf Region No depletion  Edema (RD Assessment) None  Hair Reviewed  Eyes Reviewed  Mouth Reviewed  Skin Reviewed  Nails Reviewed       Diet Order:   Diet Order             Diet NPO time specified  Diet effective now                   EDUCATION NEEDS:   No education needs have been identified at this time  Skin:  Skin  Assessment: Reviewed RN Assessment  Last BM:  unknown  Height:   Ht Readings from Last 1 Encounters:  03/29/22 '5\' 5"'$  (1.651 m)    Weight:   Wt Readings from Last 1 Encounters:  03/29/22 66.2 kg    BMI:  Body mass index is 24.29 kg/m.  Estimated Nutritional Needs:   Kcal:  1800-2000  Protein:  90-100 grams  Fluid:  >1.8 L/day  Lockie Pares., RD, LDN, CNSC See AMiON for contact information

## 2022-03-30 NOTE — Progress Notes (Signed)
OT Cancellation Note  Patient Details Name: [name] Giles MRN: [id number] DOB: November 28, 1955   Cancelled Treatment:    Reason Eval/Treat Not Completed: Active bedrest order. Will await clearance of bedrest before proceeding with eval.  [place], OTR/L [facility] [phone] Office [phone]    Almon Register 03/30/2022, 7:50 AM

## 2022-03-30 NOTE — Progress Notes (Signed)
PT Cancellation Note  Patient Details Name: [name] Giles MRN: [id number] DOB: March 15, 1956   Cancelled Treatment:    Reason Eval/Treat Not Completed: Active bedrest order x24 hours starting at 19:48 on 03/29/22. Will plan to follow-up tomorrow as able.  Moishe Spice, PT, DPT [facility] Services  Office: [place] 03/30/2022, 7:49 AM

## 2022-03-30 NOTE — Progress Notes (Signed)
SLP Cancellation Note  Patient Details Name: [name] Giles MRN: [id number] DOB: 13-Sep-1955   Cancelled treatment:       Reason Eval/Treat Not Completed: Fatigue/lethargy limiting ability to participate. Pt likely too lethargic to attempt swallow eval at this time per discussion with RN, but he is planning to try to wean her precedex this morning. He will reach out if she seems to become more adequately alert after that. Will f/u as able.     Osie Bond., M.A. [place] Office [phone]  Secure chat preferred  03/30/2022, 8:49 AM

## 2022-03-30 NOTE — Progress Notes (Addendum)
STROKE TEAM PROGRESS NOTE   INTERVAL HISTORY EVD placed by neurosurgery  overnight  Son, sister and daughter in room. Bedside RNS x2 also present. RN reports restless agitation, trying to get OOB overnight for which preceex has been started.  Patient is somnolent, unable to provide history.  Per son patient found down in bathroom at work after complaining of headache all day and then nausea and vomiting. Perhaps had minimal word finding difficulty on the day prior. They also report COPD with frequent coughing and smoking one pack per day. 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 All questions were answered.    Vitals:   03/30/22 0830 03/30/22 0900 03/30/22 0915 03/30/22 0930  BP: (!) 154/62 (!) 154/65 (!) 160/60 (!) 166/74  Pulse: 61 60 60 65  Resp: '16 15 14 16  '$ Temp: 99.7 F (37.6 C) 99.7 F (37.6 C) 99.7 F (37.6 C) 99.7 F (37.6 C)  TempSrc:      SpO2: 98% 98% 98% 99%  Weight:      Height:       CBC:  Recent Labs  Lab 03/29/22 1533  WBC 9.3  NEUTROABS 5.5  HGB 14.5  HCT 44.8  MCV 91.6  PLT 009   Basic Metabolic Panel:  Recent Labs  Lab 03/30/22 0209 03/30/22 0600  NA 144 148*  K  --  4.0  CL  --  121*  CO2  --  24  GLUCOSE  --  121*  BUN  --  7*  CREATININE  --  0.55  CALCIUM  --  8.3*   Lipid Panel:  Recent Labs  Lab 03/29/22 2040  CHOL 190  TRIG 28  HDL 65  CHOLHDL 2.9  VLDL 6  LDLCALC 119*   HgbA1c: No results for input(s): "HGBA1C" in the last 168 hours. Urine Drug Screen:  Recent Labs  Lab 03/29/22 1654  LABOPIA NONE DETECTED  COCAINSCRNUR NONE DETECTED  LABBENZ NONE DETECTED  AMPHETMU NONE DETECTED  THCU NONE DETECTED  LABBARB NONE DETECTED    Alcohol Level  Recent  Labs  Lab 03/29/22 1533  ETH <10    IMAGING past 24 hours MR BRAIN W WO CONTRAST  Result Date: 03/30/2022 CLINICAL DATA:  Initial evaluation for neuro deficit, stroke suspected, intracranial hemorrhage. EXAM: MRI HEAD WITHOUT AND WITH CONTRAST TECHNIQUE: Multiplanar, multiecho pulse sequences of the brain and surrounding structures were obtained without and with intravenous contrast. CONTRAST:  6.64m GADAVIST GADOBUTROL 1 MMOL/ML IV SOLN COMPARISON:  Prior CT from 03/29/2022. FINDINGS: Brain: Examination moderately degraded by motion artifact. Previously identified large intraparenchymal hemorrhage centered at the left temporal lobe again seen, measuring approximately 5.9 x 4.6 x 5.0 cm (estimated volume 68 mL). This is similar in size from prior. Surrounding vasogenic edema with regional mass effect and up to 1 cm of left-to-right shift. This has progressed from prior. Secondary crowding of the basilar cisterns without transtentorial herniation. Following contrast administration, there is an irregular heterogeneous area of postcontrast enhancement along the lateral margin of the hemorrhage, measuring up to 1.4 cm in size (series 18, image 18). Some of this enhancement appears to potentially be contiguous with overlying cortical veins (series 18, image 17). Additionally, there is question of a few small vascular flow voids within this area (  series 15, image 10). Few small parenchymal calcifications seen within this region on prior head CT. Findings raise the possibility for an underlying vascular lesion, although difficult to be certain given the overlying hemorrhage and motion artifact on this exam. Possible mass not entirely excluded. Intraventricular extension with moderate to large volume blood within the left greater than right lateral ventricles, third ventricle, and fourth ventricle. There has been interval placement of a right frontal approach ventriculostomy with tip at the septum pellucidum. The  right lateral ventricle is decompressed from prior exam. Ventriculomegaly involving the left lateral ventricle is relatively stable, with probable small amount of transependymal flow of CSF about the atrium. Third and fourth ventricles remain fairly normal in size. No other acute or chronic blood products seen elsewhere within the brain. No other evidence for acute or subacute infarct. Few small remote bilateral cerebellar infarcts noted. No other mass lesion or mass effect. No extra-axial collection. No other abnormal enhancement. Vascular: Major intracranial vascular flow voids are maintained. Mass effect on the left MCA due to the adjacent hemorrhage noted. Skull and upper cervical spine: Craniocervical junction within normal limits. Bone marrow signal intensity normal. Soft tissue swelling at the right frontal scalp related to the ventriculostomy. Sinuses/Orbits: Globes and orbital soft tissues within normal limits. Paranasal sinuses are largely clear. No mastoid effusion. Other: None. IMPRESSION: 1. 68 mL intraparenchymal hemorrhage centered at the left temporal lobe, similar in size as compared to prior CT. Surrounding vasogenic edema with regional mass effect, with worsened 1 cm of left-to-right shift. 2. Irregular heterogeneous area of enhancement along the lateral margin of the hemorrhage, nonspecific, but raises the possibility for an underlying vascular lesion. Further evaluation with dedicated CTA and/or catheter directed angiogram recommended for further evaluation. Possible mass not excluded, although is felt to be less likely. 3. 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 Interval placement of a right frontal approach ventriculostomy with decompression of the right lateral ventricle. Ventriculomegaly involving the left lateral ventricle is relatively unchanged. 4. Few small remote bilateral cerebellar infarcts. Case discussed by telephone at the time of  interpretation on 03/30/2022 at 5:08 am with provider ASHISH ARORA. Electronically Signed   By: Jeannine Boga M.D.   On: 03/30/2022 05:12   DG Chest 1 View  Result Date: 03/29/2022 CLINICAL DATA:  Dyspnea. EXAM: CHEST  1 VIEW COMPARISON:  Radiograph earlier today. FINDINGS: Heart is normal in size. Normal mediastinal contours, aortic atherosclerosis. Probable calcified lymph nodes in the left hilum. There is peribronchial thickening. No acute airspace disease, pleural effusion, pulmonary edema or pneumothorax. Stable osseous structures. IMPRESSION: Peribronchial thickening likely related smoking. No localizing process. Electronically Signed   By: Keith Rake M.D.   On: 03/29/2022 23:08   CT HEAD WO CONTRAST (5MM)  Result Date: 03/29/2022 CLINICAL DATA:  Stroke, follow up EXAM: CT HEAD WITHOUT CONTRAST TECHNIQUE: Contiguous axial images were obtained from the base of the skull through the vertex without intravenous contrast. RADIATION DOSE REDUCTION: This exam was performed according to the departmental dose-optimization program which includes automated exposure control, adjustment of the mA and/or kV according to patient size and/or use of iterative reconstruction technique. COMPARISON:  CT head 03/29/2022 4:06 p.m. FINDINGS: Brain: No evidence of large-territorial acute infarction. Interval increase in size of a now 6 x 4.4 x 4.8 cm (from 4.8 x 4.4 x 4.7 cm) left frontotemporal intraparenchymal hemorrhage. Interval development of new foci intraparenchymal hemorrhage within the left uncus/medial temporal lobe. Intraventricular hemorrhage extension. No subarachnoid  or subdural hematoma. Stable to minimally increased 7 mm (from 5-6 mm) left to right midline shift. Persistent impending left uncal herniation with encroachment of the left ambient cistern. No hydrocephalus. Vascular: No hyperdense vessel. Skull: No acute fracture or focal lesion. Sinuses/Orbits: Paranasal sinuses and mastoid air cells  are clear. The orbits are unremarkable. Other: None. IMPRESSION: Interval increase in size of a 6 x 4.4 x 4.8 cm left frontotemporal intraparenchymal hemorrhage. Interval development of new foci intraparenchymal hemorrhage within the left uncus/medial temporal lobe. Stable to minimally increased 7 mm (from 5-6 mm) left to right midline shift. Persistent impending left uncal herniation with encroachment on the left ambient cistern. Redemonstration of intraventricular hemorrhage. These results were called by telephone at the time of interpretation on 03/29/2022 at 6:09 pm to provider Dr Cheral Marker, who verbally acknowledged these results. Electronically Signed   By: Iven Finn M.D.   On: 03/29/2022 18:15   DG Chest Portable 1 View  Result Date: 03/29/2022 CLINICAL DATA:  Shortness of breath. EXAM: PORTABLE CHEST 1 VIEW COMPARISON:  Chest radiograph dated 10/09/2021. FINDINGS: The heart size and mediastinal contours are within normal limits. Vascular calcifications are seen in the aortic arch. Both lungs are clear. The visualized skeletal structures are unremarkable. IMPRESSION: No active disease. Aortic Atherosclerosis (ICD10-I70.0). Electronically Signed   By: Zerita Boers M.D.   On: 03/29/2022 17:12   CT HEAD WO CONTRAST (5MM)  Result Date: 03/29/2022 CLINICAL DATA:  Patient to ED via [facility] from work for altered mental status. Patient was found by coworkers in bathroom sitting by toilet. No vomit noted. Patient not wanting to answer questions or follow commands. Hx of COPD and a heavy smoker EXAM: CT HEAD WITHOUT CONTRAST CT CERVICAL SPINE WITHOUT CONTRAST TECHNIQUE: Multidetector CT imaging of the head and cervical spine was performed following the standard protocol without intravenous contrast. Multiplanar CT image reconstructions of the cervical spine were also generated. RADIATION DOSE REDUCTION: This exam was performed according to the departmental dose-optimization program which includes automated  exposure control, adjustment of the mA and/or kV according to patient size and/or use of iterative reconstruction technique. COMPARISON:  CT head 11/16/2008 FINDINGS: CT HEAD FINDINGS Brain: No evidence of large-territorial acute infarction. At least 5 x 4.5 x 4 cm intraparenchymal hemorrhage centered within the left anterior temporal and frontal lobes. Extension of the hemorrhage into the lateral, third, fourth ventricles with no definite associated hydrocephalus. Mild associated surrounding vasogenic edema. Underlying mass lesion not excluded. No definite subdural or subarachnoid hemorrhage identified. Associated 5 mm left-to-right midline shift. Impending uncal herniation on the left. Basilar cisterns are patent. Vascular: No hyperdense vessel. Skull: No acute fracture or focal lesion. Sinuses/Orbits: Paranasal sinuses and mastoid air cells are clear. The orbits are unremarkable. Other: None. CT CERVICAL SPINE FINDINGS Alignment: Normal. Skull base and vertebrae: Multilevel degenerative changes of the spine. No acute fracture. No aggressive appearing focal osseous lesion or focal pathologic process. Soft tissues and spinal canal: No prevertebral fluid or swelling. No visible canal hematoma. Upper chest: Paraseptal and centrilobular emphysematous changes. Biapical pleural/pulmonary scarring. Other: None. IMPRESSION: 1. At least 5 x 4.5 x 4 cm intraparenchymal hemorrhage centered within the left anterior temporal and frontal lobes. Associated intraventricular extension of the hemorrhage. Associated 5 mm left-to-right midline shift. Impending uncal herniation on the left. Underlying mass lesion not excluded. 2. No acute displaced fracture or traumatic listhesis of the cervical spine. 3.  Emphysema (ICD10-J43.9). These results were called by telephone at the time of interpretation on  03/29/2022 at 4:21 pm to provider [facility] , who verbally acknowledged these results. Electronically Signed   By: Iven Finn M.D.    On: 03/29/2022 16:23   CT Cervical Spine Wo Contrast  Result Date: 03/29/2022 CLINICAL DATA:  Patient to ED via [facility] from work for altered mental status. Patient was found by coworkers in bathroom sitting by toilet. No vomit noted. Patient not wanting to answer questions or follow commands. Hx of COPD and a heavy smoker EXAM: CT HEAD WITHOUT CONTRAST CT CERVICAL SPINE WITHOUT CONTRAST TECHNIQUE: Multidetector CT imaging of the head and cervical spine was performed following the standard protocol without intravenous contrast. Multiplanar CT image reconstructions of the cervical spine were also generated. RADIATION DOSE REDUCTION: This exam was performed according to the departmental dose-optimization program which includes automated exposure control, adjustment of the mA and/or kV according to patient size and/or use of iterative reconstruction technique. COMPARISON:  CT head 11/16/2008 FINDINGS: CT HEAD FINDINGS Brain: No evidence of large-territorial acute infarction. At least 5 x 4.5 x 4 cm intraparenchymal hemorrhage centered within the left anterior temporal and frontal lobes. Extension of the hemorrhage into the lateral, third, fourth ventricles with no definite associated hydrocephalus. Mild associated surrounding vasogenic edema. Underlying mass lesion not excluded. No definite subdural or subarachnoid hemorrhage identified. Associated 5 mm left-to-right midline shift. Impending uncal herniation on the left. Basilar cisterns are patent. Vascular: No hyperdense vessel. Skull: No acute fracture or focal lesion. Sinuses/Orbits: Paranasal sinuses and mastoid air cells are clear. The orbits are unremarkable. Other: None. CT CERVICAL SPINE FINDINGS Alignment: Normal. Skull base and vertebrae: Multilevel degenerative changes of the spine. No acute fracture. No aggressive appearing focal osseous lesion or focal pathologic process. Soft tissues and spinal canal: No prevertebral fluid or swelling. No visible  canal hematoma. Upper chest: Paraseptal and centrilobular emphysematous changes. Biapical pleural/pulmonary scarring. Other: None. IMPRESSION: 1. At least 5 x 4.5 x 4 cm intraparenchymal hemorrhage centered within the left anterior temporal and frontal lobes. Associated intraventricular extension of the hemorrhage. Associated 5 mm left-to-right midline shift. Impending uncal herniation on the left. Underlying mass lesion not excluded. 2. No acute displaced fracture or traumatic listhesis of the cervical spine. 3.  Emphysema (ICD10-J43.9). These results were called by telephone at the time of interpretation on 03/29/2022 at 4:21 pm to provider [facility] , who verbally acknowledged these results. Electronically Signed   By: Iven Finn M.D.   On: 03/29/2022 16:23    PHYSICAL EXAM 66 yo female lying in bed, mildly restless with EVD in place. Spontaneously moving bilat LE and shifting position.  Somnolent, does not keep eyes open. Opens eyes briefly to  voice, moans and mumbles "what are you doing?", otherwise no verbal response. Does not focus on or track examiner. Does not answer questions or follow commands. Resists exam. Drifts back to sleep quickly if undisturbed.  Gaze deviation to left but able to cross midline to right.  Cranial nerves: PERRL, does not blink to threat consistently from the right, face + right droop.  Motor examination with spontaneous movement LUE and BLE. Not clearly purposeful. LUE and LLE antigravity. RLE unable to hold off bed. RUE 2/5.  Sensation: Weaker withdrawal on the right in comparison to the left lower stimulation Coordination is not able to be assessed due to somnolence.   ASSESSMENT/PLAN 66 year old with above past medical history significant for COPD, current every day smoker, 3 episodes of syncope in 2014, evaluation of right-sided weakness, confusion, lethargy aphasia along  with headache and vomiting noted to have a approximately 45 cc intraparenchymal hemorrhage  in the left anterior temporal and frontal lobe with intraventricular extension and a repeat head CT in about an hour and a half showing possible new foci of ICH and somewhat worsened midline shift and early obstructive hydrocephalus. Etiology likely hypertensive hemorrhage.  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  Impending left uncal herniation.  Midline shift 5 mm Repeat HCT 2 hours after Interval increase in size of Left IPH. New IPH within the left uncus/medial temporal lobe.  MRI  03/29/22 - 68 mL intraparenchymal hemorrhage centered at the left temporal lobe, similar in size as compared to prior CT. midline shift 7 mm CT repeat 03/30/22 stable hematoma but increased midline shift to 1 cm CTA Head and Neck Cluster of abnormal, tortuous vessels along the lateral aspect of the left temporal lobe compatible with an AVF for AVM.  CT venogram negative for venous thrombosis CT repeat in a.m. pending 2D Echo EF 60-65%, No shunt or thrombus detected LDL 119 HgbA1c PENDING VTE prophylaxis - SCDs No antiplatelet or anticoagulants prior to admission per chart review, now on no antithrombotics due to [place] Therapy recommendations:  PENDING  Disposition:  TBD  Cerebral Edema On 3% NS per protocol, s/p PICC placement today Na 139->144->148->152 Neurosurgery consulted advised no surgical intervention at this time.  EVD placed. NSG is following.  On precedex for agitation Continue frequent neuro checks per ICU protocol CT repeat in a.m. pending  Left temporal AVF Neurosurgery following CTA more clearly showing this as likely source of hemorrhage Discussed with Dr. Estanislado Pandy, if without neurosurgical intervention, plan for cerebral angiogram once stable   HTN No hx of hypertension or home BP meds prior to admission Unstable  on cleviprex drip BP goal 130-150 On amlodipine 10,  lisinopril 20 now  Hyperlipidemia Home meds:   LDL 119, goal < 70 High intensity statin to be considered at discharge in setting of hemorrhagic stroke   Dysphagia Keep n.p.o. Core track placed On IVF Hold off TF for now in case neurosurgical intervention needed.  Elimination Foley catheter in place, high risk for urinary retention due to AMS Consider bowel regimen when appropriate  Tobacco abuse Current smoker 1PPD, 30 pack year hx Smoking cessation counseling will be provided  Other Stroke Risk Factors Advanced Age >/= 15  Current ETOH use, alcohol level <10, will be advised to drink no more than 1 drink a day Incidentally noted previous cerebellar stroke   Other Active Problems  Hospital day # 1  This patient was seen and evaluated with Dr. Erlinda Hong. He directed the plan of care.  Charlene Brooke, NP-C   ATTENDING NOTE: I reviewed above note and agree with the assessment and plan. Pt was seen and examined.   66 year old female with history of COPD, smoker admitted for headache, nausea vomiting, found down in bathroom with decreased level consciousness.  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  CT repeat showed mildly increase in size.  NSG consulted.  Dr. Arnoldo Morale on board.  Status post EVD.  MRI with and without contrast showed 68 cc ICH, with IVH and midline shift 27m.  Repeat CT in a.m today showed stable hematoma but midline shift up to 1 cm.  CTA head and neck showed left temporal AV  fistula, likely because of ICH.  CTV no this thrombosis.  EF 60 to 65%, LDL 119, A1c pending, UDS negative. Na [phone], creatinine 0.55  On exam, patient sister, son and daughter-in-law are at bedside.  Patient drowsy sleepy, however able to open eyes on voice, left gaze preference, able to cross midline with right gaze however seems to have right neglect.  Able to have sound out and say " what are you doing", however largely not  meaningful words or sentences.  Not following commands. Inconsistent blinking to visual threat to the left, not blinking to visual threat on the right.  Right facial droop, right upper extremity flaccid, right lower extremity mild withdraw to pain but not against gravity.  Left upper and lower extremity spontaneous movement against gravity. Sensation, coordination and gait not tested.  Etiology for patient ICH likely due to new diagnosed left temporal AVF.  Repeat CT showed hematoma currently stable, however progressive cerebral edema with impending uncal herniation.  Currently on 3% saline with sodium now 152, continue hypertonic saline with goal 1 55-1 60.  If patient developed worsening mental status, may need to discuss with Dr. Arnoldo Morale to consider hematoma evacuation.  If no neurosurgery intervention needed, may consider cerebral angiogram to further evaluate AVF once stable.  BP goal less than 160, put on p.o. BP meds through core track tube.  Hold off tube feeding for now given potential neurosurgery intervention.  Repeat CT in a.m.  For detailed assessment and plan, please refer to above/below as I have made changes wherever appropriate.   Rosalin Hawking, MD PhD Stroke Neurology 03/30/2022 6:21 PM  This patient is critically ill due to left temporal ICH, IVH, hydrocephalus, AV fistula, cerebral edema and at significant risk of neurological worsening, death form brain herniation, brain death, obstructive hydrocephalus, rebleeding, seizure. This patient's care requires constant monitoring of vital signs, hemodynamics, respiratory and cardiac monitoring, review of multiple databases, neurological assessment, discussion with family, other specialists and medical decision making of high complexity. I spent 45 minutes of neurocritical care time in the care of this patient. I had long discussion with 6/3 and son at bedside, updated pt current condition, treatment plan and potential prognosis, and answered all  the questions.  They expressed understanding and appreciation.       To contact Stroke Continuity provider, please refer to [email or web address] After hours, contact General Neurology

## 2022-03-30 NOTE — Progress Notes (Signed)
MR brain w+w/o with stable bleed but mildly worsening shift. Question underlying AVM based on enhancement with gad. Also possible there may be a MCA aneurysm. Rads recommends CTA which I will order. Stroke team to follow.  -- Amie Portland, MD Neurologist [facility] Pager: [phone]

## 2022-03-30 NOTE — Progress Notes (Signed)
  Echocardiogram 2D Echocardiogram has been performed.  Merrie Roof F 03/30/2022, 9:43 AM

## 2022-03-31 ENCOUNTER — Inpatient Hospital Stay (HOSPITAL_COMMUNITY): Payer: 59

## 2022-03-31 DIAGNOSIS — R410 Disorientation, unspecified: Secondary | ICD-10-CM

## 2022-03-31 DIAGNOSIS — I611 Nontraumatic intracerebral hemorrhage in hemisphere, cortical: Secondary | ICD-10-CM | POA: Diagnosis not present

## 2022-03-31 LAB — CBC
HCT: 36.7 % (ref 36.0–46.0)
Hemoglobin: 12.1 g/dL (ref 12.0–15.0)
MCH: 30.3 pg (ref 26.0–34.0)
MCHC: 33 g/dL (ref 30.0–36.0)
MCV: 92 fL (ref 80.0–100.0)
Platelets: 182 10*3/uL (ref 150–400)
RBC: 3.99 MIL/uL (ref 3.87–5.11)
RDW: 13.9 % (ref 11.5–15.5)
WBC: 10.3 10*3/uL (ref 4.0–10.5)
nRBC: 0 % (ref 0.0–0.2)

## 2022-03-31 LAB — BASIC METABOLIC PANEL
Anion gap: 4 — ABNORMAL LOW (ref 5–15)
BUN: 11 mg/dL (ref 8–23)
CO2: 23 mmol/L (ref 22–32)
Calcium: 8.9 mg/dL (ref 8.9–10.3)
Chloride: 129 mmol/L — ABNORMAL HIGH (ref 98–111)
Creatinine, Ser: 0.6 mg/dL (ref 0.44–1.00)
GFR, Estimated: >60 mL/min (ref >60)
Glucose, Bld: 123 mg/dL — ABNORMAL HIGH (ref 70–99)
Potassium: 3.1 mmol/L — ABNORMAL LOW (ref 3.5–5.1)
Sodium: 156 mmol/L — ABNORMAL HIGH (ref 135–145)

## 2022-03-31 LAB — GLUCOSE, CAPILLARY
Glucose-Capillary: 107 mg/dL — ABNORMAL HIGH (ref 70–99)
Glucose-Capillary: 124 mg/dL — ABNORMAL HIGH (ref 70–99)
Glucose-Capillary: 130 mg/dL — ABNORMAL HIGH (ref 70–99)
Glucose-Capillary: 157 mg/dL — ABNORMAL HIGH (ref 70–99)
Glucose-Capillary: 96 mg/dL (ref 70–99)

## 2022-03-31 LAB — SODIUM
Sodium: 156 mmol/L — ABNORMAL HIGH (ref 135–145)
Sodium: 159 mmol/L — ABNORMAL HIGH (ref 135–145)
Sodium: 155 mmol/L — ABNORMAL HIGH (ref 135–145)

## 2022-03-31 LAB — HEMOGLOBIN A1C
Hgb A1c MFr Bld: 5.7 % — ABNORMAL HIGH (ref 4.8–5.6)
Mean Plasma Glucose: 117 mg/dL

## 2022-03-31 MED ORDER — IPRATROPIUM-ALBUTEROL 0.5-2.5 (3) MG/3ML IN SOLN
3.0000 mL | Freq: Four times a day (QID) | RESPIRATORY_TRACT | Status: DC | PRN
Start: 2022-03-31 — End: 2022-04-16
  Administered 2022-04-03: 3 mL via RESPIRATORY_TRACT
  Filled 2022-03-31: qty 3

## 2022-03-31 MED ORDER — SENNOSIDES-DOCUSATE SODIUM 8.6-50 MG PO TABS
1.0000 | ORAL_TABLET | Freq: Two times a day (BID) | ORAL | Status: DC
  Administered 2022-03-31 – 2022-04-06 (×6): 1
  Filled 2022-03-31 (×6): qty 1

## 2022-03-31 MED ORDER — MORPHINE SULFATE (PF) 2 MG/ML IV SOLN
2.0000 mg | INTRAVENOUS | Status: DC | PRN
  Administered 2022-03-31: 2 mg via INTRAVENOUS
  Filled 2022-03-31: qty 1

## 2022-03-31 MED ORDER — LORAZEPAM 2 MG/ML IJ SOLN
2.0000 mg | INTRAMUSCULAR | Status: AC
  Administered 2022-03-31: 2 mg via INTRAVENOUS
  Filled 2022-03-31: qty 1

## 2022-03-31 MED ORDER — HYDRALAZINE HCL 20 MG/ML IJ SOLN
20.0000 mg | Freq: Four times a day (QID) | INTRAMUSCULAR | Status: DC | PRN
  Administered 2022-03-31 – 2022-04-02 (×2): 20 mg via INTRAVENOUS
  Filled 2022-03-31 (×3): qty 1

## 2022-03-31 MED ORDER — LORAZEPAM 2 MG/ML IJ SOLN
2.0000 mg | INTRAMUSCULAR | Status: DC | PRN
  Administered 2022-04-01 – 2022-04-02 (×4): 2 mg via INTRAVENOUS
  Filled 2022-03-31 (×6): qty 1

## 2022-03-31 MED ORDER — POTASSIUM CHLORIDE 20 MEQ PO PACK
40.0000 meq | PACK | ORAL | Status: AC
  Administered 2022-03-31 (×2): 40 meq via ORAL
  Filled 2022-03-31 (×2): qty 2

## 2022-03-31 NOTE — Consult Note (Signed)
NAME:  [name] Giles, MRN:  [id number], DOB:  August 16, 1956, LOS: 2 ADMISSION DATE:  03/29/2022, CONSULTATION DATE: 03/31/2022 REFERRING MD: Dr. Leonie Man, CHIEF COMPLAINT: Encephalopathy  History of Present Illness:  66 year old woman with history of tobacco use (30 pack years) and COPD/asthma.  She was admitted 8/10 with severe headache, N/V, involving decreased mental status.  Head CT in the [place] ED showed large left frontotemporal ICH with IVH and 5 mm midline shift.  Started on Cleviprex for hypertension, moved to [facility] for further care.  Repeat head CT with increasing hemorrhage and associated hydrocephalus.  Ventriculostomy placed on 8/10.  Subsequent course complicated by persistent hypertension as well as agitated delirium.  Precedex started on 8/11   Pertinent  Medical History   Past Medical History:  Diagnosis Date   Asthma    Medical history non-contributory     Significant Hospital Events: Including procedures, antibiotic start and stop dates in addition to other pertinent events   8/10 presented with encephalopathy, suppressed mental status in the setting of large left frontotemporal ICH plus IVH 8/10 ventriculostomy placed (Dr. Arnoldo Morale) 8/10 MRI brain > 68 cc left temporal ICH, surrounding vasogenic edema, 1 cm left right shift.  Heterogeneous parenchyma along the lateral margin of the hemorrhage, question underlying vascular lesion.  Intraventricular extension, ventriculostomy in place 8/11 CT angio head and neck > unchanged large ICH left temporal lobe with surrounding edema.  Stable mass effect 10 mm.  Unchanged IVH with been trach in place.  Patent internal carotid, vertebral arteries.  Cluster of abnormal tortuous vessels on the lateral aspect of the left temporal lobe compatible with an AVF or AVM 8/11 Precedex for agitated delirium 8/12 repeat head CT > stable IVH.  Stable right shift, estimated 9 mm.  No new findings  Interim History / Subjective:  Precedex currently  0.4 Off cleviprex   Objective   Blood pressure (!) 137/43, pulse 60, temperature 99.9 F (37.7 C), resp. rate (!) 23, height '5\' 5"'$  (1.651 m), weight 66.2 kg, SpO2 95 %.    FiO2 (%):  [2 %] 2 %   Intake/Output Summary (Last 24 hours) at 03/31/2022 1058 Last data filed at 03/31/2022 1000 Gross per 24 hour  Intake 2092.01 ml  Output 2808 ml  Net -715.99 ml   Filed Weights   03/29/22 2000  Weight: 66.2 kg    Examination: General: Ill-appearing woman, laying in bed in no distress HENT: ventric drain in place Lungs: clear distant, no wheeze Cardiovascular: regular, no M Abdomen: non-distended, + BS Extremities: no edema Neuro: Sleepy but will wake to voice.  Moves both upper extremities purposefully.  Followed commands per RN, moved all extremities at that time.  No current evidence agitation on Precedex 0.2   Resolved Hospital Problem list     Assessment & Plan:   Large left temporal ICH, IVH with hydrocephalus.  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  No significant alcohol use per the patient's son -Continue Precedex, wean as able, goal to off -Frequent neurochecks  Hypokalemia Iatrogenic hypernatremia -  replace K+ -follow freq Na  COPD/asthma.  Continued tobacco use -Albuterol available as needed -Was on Symbicort as an outpatient, consider change to LABA/LAMA -Smoking cessation counseling -Nicotine patch if she needs it  Nutrition -Currently n.p.o.   Best Practice  (right click and "Reselect all SmartList Selections" daily)   Diet/type: NPO DVT prophylaxis: SCD GI prophylaxis: PPI Lines: N/A Foley:  Yes, and it is still needed Code Status:  full code Last date of multidisciplinary goals of care discussion [pending] Family: Discussed plans, patient status with the patient's son at bedside on 8/12  Labs   CBC: Recent Labs  Lab 03/29/22 1533 03/31/22 0608  WBC 9.3 10.3  NEUTROABS 5.5  --   HGB 14.5 12.1  HCT 44.8 36.7  MCV 91.6 92.0  PLT 207 474    Basic Metabolic Panel: Recent Labs  Lab 03/30/22 0600 03/30/22 1200 03/30/22 1800 03/31/22 0228 03/31/22 0608  NA 148* 152* 151* 156* 156*  K 4.0  --   --   --  3.1*  CL 121*  --   --   --  129*  CO2 24  --   --   --  23  GLUCOSE 121*  --   --   --  123*  BUN 7*  --   --   --  11  CREATININE 0.55  --   --   --  0.60  CALCIUM 8.3*  --   --   --  8.9   GFR: Estimated Creatinine Clearance: 62.2 mL/min (by C-G formula based on SCr of 0.6 mg/dL). Recent Labs  Lab 03/29/22 1533 03/31/22 0608  WBC 9.3 10.3    Liver Function Tests: No results for input(s): "AST", "ALT", "ALKPHOS", "BILITOT", "PROT", "ALBUMIN" in the last 168 hours. Recent Labs  Lab 03/29/22 1533  LIPASE 39   Recent Labs  Lab 03/29/22 1538  AMMONIA 20    ABG    Component Value Date/Time   HCO3 31.4 (H) 03/29/2022 1533   O2SAT 54.1 03/29/2022 1533     Coagulation Profile: Recent Labs  Lab 03/30/22 0209  INR 1.0    Cardiac Enzymes: Recent Labs  Lab 03/29/22 1533  CKTOTAL 61    HbA1C: Hgb A1c MFr Bld  Date/Time Value Ref Range Status  03/29/2022 08:40 PM 5.7 (H) 4.8 - 5.6 % Final    Comment:    (NOTE)         Prediabetes: 5.7 - 6.4         Diabetes: >6.4         Glycemic control for adults with diabetes: <7.0     CBG: Recent Labs  Lab 03/31/22 0007 03/31/22 0612  GLUCAP 157* 124*    Review of Systems:   Unable to give  Past Medical History:  She,  has a past medical history  of Asthma and Medical history non-contributory.   Surgical History:   Past Surgical History:  Procedure Laterality Date   COLONOSCOPY     COLONOSCOPY N/A 04/06/2013   Procedure: COLONOSCOPY;  Surgeon: Daneil Dolin, MD;  Location: [facility];  Service: Endoscopy;  Laterality: N/A;  9:30 AM   ENDOMETRIAL ABLATION       Social History:   reports that she has been smoking cigarettes. She has a 30.00 pack-year smoking history. She has never used smokeless tobacco. She reports current alcohol use. She reports that she does not use drugs.   Family History:  Her family history includes Breast cancer in  her mother; Diabetes in her brother; Hypertension in her brother and brother. There is no history of Colon cancer. She was adopted.   Allergies No Known Allergies   Home Medications  Prior to Admission medications   Medication Sig Start Date End Date Taking? Authorizing Provider  albuterol (VENTOLIN HFA) 108 (90 Base) MCG/ACT inhaler Inhale 1-2 puffs into the lungs every 4 (four) hours as needed for shortness of breath. 10/27/19  Yes [provider]  ibuprofen (ADVIL) 600 MG tablet Take 1 tablet (600 mg total) by mouth every 6 (six) hours as needed. Patient taking differently: Take 600 mg by mouth every 6 (six) hours as needed for mild pain. 07/17/20  Yes Melynda Ripple, MD  albuterol (PROVENTIL) 4 MG tablet Take 4 mg by mouth 3 (three) times daily. Patient not taking: Reported on 03/29/2022    [provider]  fluticasone (FLONASE) 50 MCG/ACT nasal spray Place 2 sprays into both nostrils daily. Patient not taking: Reported on 03/29/2022 07/17/20   Melynda Ripple, MD  [facility] 160-4.5 MCG/ACT inhaler Inhale 2 puffs into the lungs daily. Patient not taking: Reported on 03/29/2022 09/01/19   [provider]     Critical care time: 59 min     Baltazar Apo, MD, PhD 03/31/2022, 11:44 AM [facility] [phone] or if no answer before  7:00PM call [phone] For any issues after 7:00PM please call eLink [phone]

## 2022-03-31 NOTE — Progress Notes (Addendum)
STROKE TEAM PROGRESS NOTE   INTERVAL HISTORY HR 50s-60s. Afebrile. Off cleviprex currently. BP systolic 342A. Afebrile. Remains on nasal cannula.  EVD remains in place and draining well per bedside RN.  Son and brother in room. Brother states patient greeted him and asked him how he was doing. Bedside RN present. RN reports patient remains drowsy and is following commands in upper extremity exam and mumbling incoherent words intermittently.  Patient remains somnolent, unable to provide history or answer ROS.  We discussed her current status, aphasia and ongoing work up including latest CT imaging within the past hour which appears stable. Both short term management and prognosis addressed. Plan of care including medical measures and/or possible need for surgical intervention/angiogram and critical nature of her illness, and need to control cerebral edema.  All questions were answered.   Blood pressure adequately controlled.  Neurological exam fluctuates but mostly stable.  Repeat CT scan of the head shows stable appearance of the left temporal hemorrhage 9 mm left-to-right shift.  Ventriculostomy draining well.  Discussed with Dr. Venetia Constable neurosurgeon plan for conservative treatment over the weekend and diagnostic angiogram/AVM treatment next week by Dr. Kathyrn Sheriff Vitals:   03/31/22 0500 03/31/22 0515 03/31/22 0700 03/31/22 0800  BP: 133/61 (!) 145/59 (!) 120/53 110/87  Pulse: (!) 58 62 62 60  Resp: 20 16 (!) 24 20  Temp: 99.3 F (37.4 C) 99.3 F (37.4 C) 99.5 F (37.5 C) 99.5 F (37.5 C)  TempSrc:      SpO2: 95% 93% 94% 92%  Weight:      Height:       CBC:  Recent Labs  Lab 03/29/22 1533 03/31/22 0608  WBC 9.3 10.3  NEUTROABS 5.5  --   HGB 14.5 12.1  HCT 44.8 36.7  MCV 91.6 92.0  PLT 207 768   Basic Metabolic Panel:  Recent Labs  Lab 03/30/22 0600 03/30/22 1200 03/31/22 0228 03/31/22 0608  NA 148*   < > 156* 156*  K 4.0  --   --  3.1*  CL 121*  --   --  129*  CO2 24   --   --  23  GLUCOSE 121*  --   --  123*  BUN 7*  --   --  11  CREATININE 0.55  --   --  0.60  CALCIUM 8.3*  --   --  8.9   < > = values in this interval not displayed.   Lipid Panel:  Recent Labs  Lab 03/29/22 2040  CHOL 190  TRIG 28  HDL 65  CHOLHDL 2.9  VLDL 6  LDLCALC 119*   HgbA1c:  Recent Labs  Lab 03/29/22 2040  HGBA1C 5.7*   Urine Drug Screen:  Recent Labs  Lab 03/29/22 1654  LABOPIA NONE DETECTED  COCAINSCRNUR NONE DETECTED  LABBENZ NONE DETECTED  AMPHETMU NONE DETECTED  THCU NONE DETECTED  LABBARB NONE DETECTED    Alcohol Level  Recent Labs  Lab 03/29/22 1533  ETH <10    IMAGING past 24 hours [place] EKG SITE RITE  Result Date: 03/30/2022 If Site Rite image not attached, placement could not be confirmed due to current cardiac rhythm.  DG Abd Portable 1V  Result Date: 03/30/2022 CLINICAL DATA:  Nasogastric tube placement. EXAM: PORTABLE ABDOMEN - 1 VIEW COMPARISON:  None Available. FINDINGS: The bowel gas pattern is normal. Distal tip of feeding tube is seen in expected position of distal stomach. No radio-opaque calculi or other significant radiographic abnormality are seen.  IMPRESSION: Distal tip of feeding tube seen in expected position of distal stomach. Electronically Signed   By: Marijo Conception M.D.   On: 03/30/2022 12:20   [place] EKG SITE RITE  Result Date: 03/30/2022 If Site Rite image not attached, placement could not be confirmed due to current cardiac rhythm.  ECHOCARDIOGRAM COMPLETE  Result Date: 03/30/2022    ECHOCARDIOGRAM REPORT   Patient Name:   JAIYANA CANALE Date of Exam: 03/30/2022 Medical Rec #:  [id number]        Height:       65.0 in Accession #:    [id number]       Weight:       145.9 lb Date of Birth:  14-Feb-1956        BSA:          1.730 m Patient Age:    66 years         BP:           150/66 mmHg Patient Gender: F                HR:           62 bpm. Exam Location:  Inpatient Procedure: 2D Echo, Cardiac Doppler and Color Doppler  Indications:    Stroke  History:        Patient has no prior history of Echocardiogram examinations.  Sonographer:    Merrie Roof RDCS Referring Phys: [id number] ASHISH ARORA IMPRESSIONS  1. Left ventricular ejection fraction, by estimation, is 60 to 65%. The left ventricle has normal function. The left ventricle has no regional wall motion abnormalities. Left ventricular diastolic parameters are indeterminate. Elevated left ventricular end-diastolic pressure.  2. Right ventricular systolic function is normal. The right ventricular size is normal. Tricuspid regurgitation signal is inadequate for assessing PA pressure.  3. The mitral valve is degenerative. Mild mitral valve regurgitation. No evidence of mitral stenosis. Moderate mitral annular calcification.  4. The aortic valve is normal in structure. Aortic valve regurgitation is not visualized. No aortic stenosis is present.  5. The inferior vena cava is dilated in size with >50% respiratory variability, suggesting right atrial pressure of 8 mmHg. Conclusion(s)/Recommendation(s): No intracardiac source of embolism detected on this transthoracic study. Consider a transesophageal echocardiogram to exclude cardiac source of embolism if clinically indicated. FINDINGS  Left Ventricle: Left ventricular ejection fraction, by estimation, is 60 to 65%. The left ventricle has normal function. The left ventricle has no regional wall motion abnormalities. The left ventricular internal cavity size was normal in size. There is  no left ventricular hypertrophy. Left ventricular diastolic parameters are indeterminate. Elevated left ventricular end-diastolic pressure. Right Ventricle: The right ventricular size is normal. No increase in right ventricular wall thickness. Right ventricular systolic function is normal. Tricuspid regurgitation signal is inadequate for assessing PA pressure. Left Atrium: Left atrial size was normal in size. Right Atrium: Right atrial size was normal in  size. Pericardium: There is no evidence of pericardial effusion. Mitral Valve: The mitral valve is degenerative in appearance. There is moderate thickening of the mitral valve leaflet(s). There is mild calcification of the mitral valve leaflet(s). Moderate mitral annular calcification. Mild mitral valve regurgitation.  No evidence of mitral valve stenosis. Tricuspid Valve: The tricuspid valve is normal in structure. Tricuspid valve regurgitation is not demonstrated. No evidence of tricuspid stenosis. Aortic Valve: The aortic valve is normal in structure. Aortic valve regurgitation is not visualized. No aortic stenosis is present. Pulmonic Valve: The pulmonic valve  was normal in structure. Pulmonic valve regurgitation is not visualized. No evidence of pulmonic stenosis. Aorta: The aortic root is normal in size and structure. Venous: The inferior vena cava is dilated in size with greater than 50% respiratory variability, suggesting right atrial pressure of 8 mmHg. IAS/Shunts: No atrial level shunt detected by color flow Doppler.  LEFT VENTRICLE PLAX 2D LVIDd:         3.90 cm   Diastology LVIDs:         3.00 cm   LV e' medial:    6.42 cm/s LV PW:         1.00 cm   LV E/e' medial:  17.4 LV IVS:        0.90 cm   LV e' lateral:   6.64 cm/s LVOT diam:     1.80 cm   LV E/e' lateral: 16.9 LV SV:         47 LV SV Index:   27 LVOT Area:     2.54 cm  RIGHT VENTRICLE          IVC RV Basal diam:  2.50 cm  IVC diam: 2.20 cm LEFT ATRIUM             Index        RIGHT ATRIUM           Index LA diam:        2.90 cm 1.68 cm/m   RA Area:     11.30 cm LA Vol (A2C):   46.9 ml 27.11 ml/m  RA Volume:   29.00 ml  16.76 ml/m LA Vol (A4C):   32.4 ml 18.73 ml/m LA Biplane Vol: 43.4 ml 25.08 ml/m  AORTIC VALVE LVOT Vmax:   80.90 cm/s LVOT Vmean:  52.100 cm/s LVOT VTI:    0.185 m MITRAL VALVE MV Area (PHT): 3.77 cm     SHUNTS MV Decel Time: 201 msec     Systemic VTI:  0.18 m MV E velocity: 112.00 cm/s  Systemic Diam: 1.80 cm MV A  velocity: 111.00 cm/s MV E/A ratio:  1.01 Fransico Him MD Electronically signed by Fransico Him MD Signature Date/Time: 03/30/2022/10:27:00 AM    Final    CT ANGIO HEAD NECK W WO CM  Result Date: 03/30/2022 CLINICAL DATA:  Stroke, hemorrhagic. EXAM: CT VENOGRAM HEAD: CT ANGIOGRAPHY HEAD AND NECK TECHNIQUE: Multidetector CT imaging of the head and neck was performed using the standard protocol during bolus administration of intravenous contrast. Multiplanar CT image reconstructions and MIPs were obtained to evaluate the vascular anatomy. Carotid stenosis measurements (when applicable) are obtained utilizing NASCET criteria, using the distal internal carotid diameter as the denominator. Venographic phase images of the brain were obtained following the administration of intravenous contrast. Multiplanar reformats and maximum intensity projections were generated. RADIATION DOSE REDUCTION: This exam was performed according to the departmental dose-optimization program which includes automated exposure control, adjustment of the mA and/or kV according to patient size and/or use of iterative reconstruction technique. CONTRAST:  53m OMNIPAQUE IOHEXOL 350 MG/ML SOLN COMPARISON:  Brain MRI 03/30/2022.  Head CT 03/29/2022. FINDINGS: CT HEAD FINDINGS Brain: Cerebral volume is normal for age. Unchanged position of a right frontal approach ventricular catheter, which enters the body of the right lateral ventricle and terminates near midline. Punctate locule of pneumocephalus within the right lateral ventricle frontal horn. A large acute parenchymal hemorrhage centered within the left temporal lobe has not significantly changed in size from the MRI performed earlier today, again measuring  5.9 x 4.6 cm in transaxial dimensions. Stable surrounding edema. Unchanged mass effect with 10 mm rightward midline shift measured at the level of the septum pellucidum. The basal cisterns remain patent. Unchanged large-volume  intraventricular extension of hemorrhage, greatest within the left lateral, third and fourth ventricles. Unchanged dilation of the left lateral ventricle. The right lateral ventricle remains decompressed. Small chronic cerebellar infarcts, better appreciated on the prior MRI. Vascular: No hyperdense vessel. Atherosclerotic calcifications. Skull: No fracture or aggressive osseous lesion. Sinuses/Orbits: No orbital mass or acute orbital finding. Trace mucosal thickening scattered within the bilateral ethmoid sinuses. Review of the MIP images confirms the above findings CTA NECK FINDINGS Aortic arch: Standard aortic branching. Atherosclerotic plaque within the visualized aortic arch and proximal major branch vessels of the neck. Streak and beam hardening artifact arising from a dense right-sided contrast bolus partially obscures the right subclavian artery. Within this limitation, there is no appreciable hemodynamically significant innominate or proximal subclavian artery stenosis. Right carotid system: CCA and ICA patent within the neck without hemodynamically significant stenosis (50% or greater). Mild atherosclerotic plaque about the carotid bifurcation and within the proximal ICA. Left carotid system: CCA and ICA patent within the neck without hemodynamically significant stenosis (50% or greater). Atherosclerotic plaque. Most notably, there is mild-to-moderate calcified plaque about the carotid bifurcation and within the proximal ICA. Vertebral arteries: Codominant and patent within the neck without stenosis or significant atherosclerotic disease. Skeleton: Levocurvature of the cervical spine. Partially imaged dextrocurvature of the thoracic spine. Cervical spondylosis. No acute fracture or aggressive osseous lesion. Other neck: No neck mass or cervical lymphadenopathy. Upper chest: No consolidation within the imaged lung apices. Centrilobular and paraseptal emphysema. Biapical pleuroparenchymal scarring Review of  the MIP images confirms the above findings CTA HEAD FINDINGS Anterior circulation: The intracranial internal carotid arteries are patent. Atherosclerotic plaque within both vessels with no more than mild stenosis The M1 middle cerebral arteries are patent. No M2 proximal branch occlusion is identified. Atherosclerotic irregularity of the M2 and more distal MCA vessels without high-grade proximal stenosis identified. The anterior cerebral arteries are patent. Developmentally diminutive left A1 segment. Cluster of tortuous abnormal vessels along the lateral aspect of the left temporal lobe compatible with an AVF for AVM. There is a large draining vein extending from this site posteriorly to the junction of the left transverse and sigmoid dural venous sinuses. A smaller draining vein extends anteriorly within the left middle cranial fossa. Posterior circulation: The intracranial vertebral arteries are patent. The basilar artery is patent. The posterior cerebral arteries are patent. Hypoplastic right P1 segment with sizable right posterior communicating artery. The left posterior communicating artery is diminutive or absent. Venous sinuses: Within the limitations of contrast timing, no convincing thrombus. Anatomic variants: As described. Review of the MIP images confirms the above findings CT VENOGRAM HEAD FINDINGS The superior sagittal sinus, internal cerebral veins, vein of Galen, straight sinus, transverse sinuses, sigmoid sinuses and visualized jugular veins are patent. There is no appreciable intracranial venous thrombosis. Cluster of abnormal, tortuous vessels along the lateral aspect of the left temporal lobe as described above. Large draining vein extending from this site posteriorly to the junction of the left transverse and sigmoid dural venous sinuses. A smaller draining vein extends anteriorly within the left middle cranial fossa. CTA head impression #1 will be called to the ordering clinician or  representative by the Radiologist Assistant, and communication documented in the PACS or [employer]. IMPRESSION: Non-contrast head CT: 1. Unchanged large acute parenchymal hemorrhage centered  within the left temporal lobe with surrounding edema. Unchanged mass effect with 10 mm rightward midline shift. 2. Unchanged large volume intraventricular extension of hemorrhage. 3. Stable position of a right frontal approach ventricular catheter, which enters the body of the right lateral ventricle and terminates near midline. The right lateral ventricle remains decompressed. Unchanged dilation of the left lateral ventricle. a CTA neck: 1. The common carotid, internal carotid and vertebral arteries are patent within the neck without hemodynamically significant stenosis. Atherosclerotic plaque within bilateral carotid systems within the neck, as described. 2. Aortic Atherosclerosis (ICD10-I70.0) and Emphysema (ICD10-J43.9). CTA head: 1. Cluster of abnormal, tortuous vessels along the lateral aspect of the left temporal lobe compatible with an AVF for AVM. There is a large draining vein extending from this site posteriorly to the junction of the left transverse and sigmoid dural venous sinuses. A smaller draining vein extends anteriorly within the left middle cranial fossa. Catheter-based angiography should be considered for further evaluation. 2. Intracranial atherosclerotic disease without large vessel occlusion or proximal high-grade arterial stenosis. CT venogram head: 1. 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 2. No evidence of dural venous sinus thrombosis. Electronically Signed   By: Kellie Simmering D.O.   On: 03/30/2022 10:21   CT VENOGRAM HEAD  Result Date: 03/30/2022 CLINICAL DATA:  Stroke, hemorrhagic. EXAM: CT VENOGRAM HEAD: CT ANGIOGRAPHY HEAD AND NECK TECHNIQUE: Multidetector CT imaging of the head and neck was performed using the standard protocol during bolus  administration of intravenous contrast. Multiplanar CT image reconstructions and MIPs were obtained to evaluate the vascular anatomy. Carotid stenosis measurements (when applicable) are obtained utilizing NASCET criteria, using the distal internal carotid diameter as the denominator. Venographic phase images of the brain were obtained following the administration of intravenous contrast. Multiplanar reformats and maximum intensity projections were generated. RADIATION DOSE REDUCTION: This exam was performed according to the departmental dose-optimization program which includes automated exposure control, adjustment of the mA and/or kV according to patient size and/or use of iterative reconstruction technique. CONTRAST:  56m OMNIPAQUE IOHEXOL 350 MG/ML SOLN COMPARISON:  Brain MRI 03/30/2022.  Head CT 03/29/2022. FINDINGS: CT HEAD FINDINGS Brain: Cerebral volume is normal for age. Unchanged position of a right frontal approach ventricular catheter, which enters the body of the right lateral ventricle and terminates near midline. Punctate locule of pneumocephalus within the right lateral ventricle frontal horn. A large acute parenchymal hemorrhage centered within the left temporal lobe has not significantly changed in size from the MRI performed earlier today, again measuring 5.9 x 4.6 cm in transaxial dimensions. Stable surrounding edema. Unchanged mass effect with 10 mm rightward midline shift measured at the level of the septum pellucidum. The basal cisterns remain patent. Unchanged large-volume intraventricular extension of hemorrhage, greatest within the left lateral, third and fourth ventricles. Unchanged dilation of the left lateral ventricle. The right lateral ventricle remains decompressed. Small chronic cerebellar infarcts, better appreciated on the prior MRI. Vascular: No hyperdense vessel. Atherosclerotic calcifications. Skull: No fracture or aggressive osseous lesion. Sinuses/Orbits: No orbital mass or  acute orbital finding. Trace mucosal thickening scattered within the bilateral ethmoid sinuses. Review of the MIP images confirms the above findings CTA NECK FINDINGS Aortic arch: Standard aortic branching. Atherosclerotic plaque within the visualized aortic arch and proximal major branch vessels of the neck. Streak and beam hardening artifact arising from a dense right-sided contrast bolus partially obscures the right subclavian artery. Within this limitation, there is no appreciable hemodynamically significant innominate  or proximal subclavian artery stenosis. Right carotid system: CCA and ICA patent within the neck without hemodynamically significant stenosis (50% or greater). Mild atherosclerotic plaque about the carotid bifurcation and within the proximal ICA. Left carotid system: CCA and ICA patent within the neck without hemodynamically significant stenosis (50% or greater). Atherosclerotic plaque. Most notably, there is mild-to-moderate calcified plaque about the carotid bifurcation and within the proximal ICA. Vertebral arteries: Codominant and patent within the neck without stenosis or significant atherosclerotic disease. Skeleton: Levocurvature of the cervical spine. Partially imaged dextrocurvature of the thoracic spine. Cervical spondylosis. No acute fracture or aggressive osseous lesion. Other neck: No neck mass or cervical lymphadenopathy. Upper chest: No consolidation within the imaged lung apices. Centrilobular and paraseptal emphysema. Biapical pleuroparenchymal scarring Review of the MIP images confirms the above findings CTA HEAD FINDINGS Anterior circulation: The intracranial internal carotid arteries are patent. Atherosclerotic plaque within both vessels with no more than mild stenosis The M1 middle cerebral arteries are patent. No M2 proximal branch occlusion is identified. Atherosclerotic irregularity of the M2 and more distal MCA vessels without high-grade proximal stenosis identified. The  anterior cerebral arteries are patent. Developmentally diminutive left A1 segment. Cluster of tortuous abnormal vessels along the lateral aspect of the left temporal lobe compatible with an AVF for AVM. There is a large draining vein extending from this site posteriorly to the junction of the left transverse and sigmoid dural venous sinuses. A smaller draining vein extends anteriorly within the left middle cranial fossa. Posterior circulation: The intracranial vertebral arteries are patent. The basilar artery is patent. The posterior cerebral arteries are patent. Hypoplastic right P1 segment with sizable right posterior communicating artery. The left posterior communicating artery is diminutive or absent. Venous sinuses: Within the limitations of contrast timing, no convincing thrombus. Anatomic variants: As described. Review of the MIP images confirms the above findings CT VENOGRAM HEAD FINDINGS The superior sagittal sinus, internal cerebral veins, vein of Galen, straight sinus, transverse sinuses, sigmoid sinuses and visualized jugular veins are patent. There is no appreciable intracranial venous thrombosis. Cluster of abnormal, tortuous vessels along the lateral aspect of the left temporal lobe as described above. Large draining vein extending from this site posteriorly to the junction of the left transverse and sigmoid dural venous sinuses. A smaller draining vein extends anteriorly within the left middle cranial fossa. CTA head impression #1 will be called to the ordering clinician or representative by the Radiologist Assistant, and communication documented in the PACS or [employer]. IMPRESSION: Non-contrast head CT: 1. Unchanged large acute parenchymal hemorrhage centered within the left temporal lobe with surrounding edema. Unchanged mass effect with 10 mm rightward midline shift. 2. Unchanged large volume intraventricular extension of hemorrhage. 3. Stable position of a right frontal approach  ventricular catheter, which enters the body of the right lateral ventricle and terminates near midline. The right lateral ventricle remains decompressed. Unchanged dilation of the left lateral ventricle. a CTA neck: 1. The common carotid, internal carotid and vertebral arteries are patent within the neck without hemodynamically significant stenosis. Atherosclerotic plaque within bilateral carotid systems within the neck, as described. 2. Aortic Atherosclerosis (ICD10-I70.0) and Emphysema (ICD10-J43.9). CTA head: 1. Cluster of abnormal, tortuous vessels along the lateral aspect of the left temporal lobe compatible with an AVF for AVM. There is a large draining vein extending from this site posteriorly to the junction of the left transverse and sigmoid dural venous sinuses. A smaller draining vein extends anteriorly within the left middle cranial fossa. Catheter-based angiography should  be considered for further evaluation. 2. Intracranial atherosclerotic disease without large vessel occlusion or proximal high-grade arterial stenosis. CT venogram head: 1. 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 2. No evidence of dural venous sinus thrombosis. Electronically Signed   By: Kellie Simmering D.O.   On: 03/30/2022 10:21    PHYSICAL EXAM 66 yo female lying in bed, resting quietly with EVD place.  She is spontaneously moving all extremities with bilateral mitten restraints in place.  Somnolent, does not keep eyes open. Opens eyes briefly to voice,   Now following some simple commands but not reliably.  Mixed receptive and expressive aphasia and can follow simple midline and some one-step commands only.  Speech at times difficult to understand at times clear. Gaze deviation to left but able to cross midline to right.  Cranial nerves: PERRL, does not blink to threat consistently from the right, face + right droop.  Motor examination improved today with patient able to elevate  bilat UE off bed and hold with right drift. Holds bilat LE off bed briefly to command.  Sensation:  Weaker withdrawal on the right in comparison to the left lower stimulation Coordination is not able to be assessed due to somnolence.    ASSESSMENT/PLAN 66 year old female with history of COPD, smoker admitted for headache, nausea vomiting, found down in bathroom with decreased level consciousness.  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  CT repeat showed mildly increase in size.  NSG consulted.  Dr. Arnoldo Morale on board.  Status post EVD.  MRI with and without contrast showed 68 cc ICH, with IVH and midline shift 89m.  Repeat CT in a.m today showed stable hematoma but midline shift up to 1 cm.  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  CTV no this thrombosis.  EF 60 to 65%, LDL 119, A1c pending, UDS negative. 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  Impending left uncal herniation.  Midline shift 5 mm Repeat HCT 2 hours after Interval increase in size of Left IPH. New IPH within the left uncus/medial temporal lobe.  MRI  03/29/22 - 68 mL intraparenchymal hemorrhage centered at the left temporal lobe, similar in size as compared to prior CT. midline shift 7 mm CT repeat 03/30/22 stable hematoma but increased midline shift to 1 cm CTA Head and Neck Cluster of abnormal, tortuous vessels along the lateral aspect of the left temporal lobe compatible with an AVF for AVM.  CT venogram negative for venous thrombosis Repeat HCT 8/12 showed Stable large left temporal lobe hemorrhage and edema since yesterday. Stable intraventricular hemorrhage and EVD along with mild to moderate enlargement of the left temporal lobe.Stable intracranial mass effect with up to 9 mm rightward midline  shift. Basilar cisterns remain patent. No new intracranial abnormality 2D Echo EF 60-65%, No shunt or thrombus detected LDL 119 HgbA1c 5.7 VTE prophylaxis - SCDs No antiplatelet or anticoagulants prior to admission per chart review, now on no antithrombotics due to [place] Therapy recommendations:  PENDING  Disposition:  TBD  Cerebral Edema On 3% NS per protocol, s/p PICC placement Na 139->144->148->152->156 Neurosurgery consulted advised no surgical intervention at this time.  EVD placed. NSG is following which is appreciated.  On precedex  for agitation Continue frequent neuro checks per ICU protocol CCM asked to consult today to assist with management which is appreciated.  Left temporal AVF Neurosurgery following CTA more clearly showing this as likely source of hemorrhage Dr. Leonie Man discussed with neurosurgery. Plan for angiogram next week and definitive treatment of AVM by Dr. Kathyrn Sheriff.  HTN No hx of hypertension or home BP meds prior to admission Unstable  on cleviprex drip BP goal 130-150 On amlodipine 10, lisinopril 20 now  Hyperlipidemia Home meds:   LDL 119, goal < 70 High intensity statin to be considered at discharge in setting of hemorrhagic stroke   Dysphagia Ok to start tube feeding per RD recs Core track placed On IVF, wean off  on feeding started and tolerated   Elimination Foley catheter in place, high risk for urinary retention due to AMS Consider bowel regimen when appropriate  Tobacco abuse Current smoker 1PPD, 30 pack year hx Smoking cessation counseling will be provided  Other Stroke Risk Factors Advanced Age >/= 86  Current ETOH use, alcohol level <10, will be advised to drink no more than 1 drink a day Incidentally noted previous cerebellar stroke   Hypokalemia Potassium repleted. Continue to monitor   Other [facility] day # 2 This patient was seen and evaluated with Leonie Man. He directed the plan of care.  Charlene Brooke, NP-C    STROKE MD NOTE :  I have personally obtained history,examined this patient, reviewed notes, independently viewed imaging studies, participated in medical decision making and plan of care.ROS completed by me personally and pertinent positives fully documented  I have made any additions or clarifications directly to the above note. Agree with note above.  Patient presented with large left temporal parenchymal hemorrhage with intraventricular extension and hydrocephalus has had ventriculostomy which is draining well.  She remains drowsy and intermittently agitated requiring Precedex drip and exam is limited but she appears aphasic with mild right-sided weakness.  She is also on hypertonic saline with serum sodium at goal and plan to keep serum sodium 150-155 range.  Strict control of blood pressure with systolic goal below 191.  Discussed with Dr. Venetia Constable neurosurgeon on-call will hold off on definitive treatment of AVM over the weekend and do diagnostic angiogram followed by AVM treatment next week.  Long discussion with the patient's son and brother at the bedside and answered questions.  We will will ask pulmonary critical care team to consult and help with medical management.  Discussed with Dr. Malvin Johns This patient is critically ill and at significant risk of neurological worsening, death and care requires constant monitoring of vital signs, hemodynamics,respiratory and cardiac monitoring, extensive review of multiple databases, frequent neurological assessment, discussion with family, other specialists and medical decision making of high complexity.I have made any additions or clarifications directly to the above note.This critical care time does not reflect procedure time, or teaching time or supervisory time of PA/NP/Med Resident etc but could involve care discussion time.  I spent 40 minutes of neurocritical care time  in the care of  this patient.     Antony Contras,  MD Medical Director [facility] Pager: [phone] 03/31/2022 1:28 PM  . To contact Stroke Continuity provider, please refer to [email or web address] After hours, contact General Neurology

## 2022-03-31 NOTE — Progress Notes (Signed)
Neurosurgery Service Progress Note  Subjective: No acute events overnight   Objective: Vitals:   03/31/22 0800 03/31/22 0900 03/31/22 1000 03/31/22 1100  BP: 110/87 (!) 125/48 (!) 137/43 (!) 141/60  Pulse: 60 70 60 64  Resp: 20 (!) 21 (!) 23 (!) 26  Temp: 99.5 F (37.5 C) 99.9 F (37.7 C) 99.9 F (37.7 C) 99.9 F (37.7 C)  TempSrc: Bladder     SpO2: 92% 96% 95% 94%  Weight:      Height:        Physical Exam: Somnolent, PERRL, speech partially aphasic but follows commands on the right with full strength / covers her mouth to cough appropriately, left side definitely weaker but some withdrawal   Assessment & Plan: 66 y.o. woman s/p ICH, CTA w/ AVF vs AVM.  -no acute neurosurgical intervention -will d/w Nundkumar this weekend regarding angio / OR timing -continue EVD, output 108, ICP at bedside transduced at 8cm [place]  03/31/22 11:19 AM

## 2022-03-31 NOTE — Evaluation (Signed)
SLP Cancellation Note  Patient Details Name: [name] Giles MRN: [id number] DOB: April 20, 1956   Cancelled treatment:       Reason Eval/Treat Not Completed: Fatigue/lethargy limiting ability to participate;Other (comment) (RN reports pt is not ready for speech/language or swallow evaluation today, will continue efforts)  Kathleen Lime, MS [facility] SLP Acute Rehab Services Office [phone] Pager [phone]   Macario Golds 03/31/2022, 10:22 AM

## 2022-03-31 NOTE — Progress Notes (Signed)
PT Cancellation Note  Patient Details Name: [name] Giles MRN: [id number] DOB: 1955/11/14   Cancelled Treatment:    Reason Eval/Treat Not Completed: Active bedrest order;Fatigue/lethargy limiting ability to participate. Coordinated with RN who reported bedrest orders will remain active and pt is very lethargic. Will continue to hold PT at this time. Will plan to follow-up tomorrow as able.  Moishe Spice, PT, DPT Acute Rehabilitation Services  Office: [place] 03/31/2022, 8:05 AM

## 2022-04-01 DIAGNOSIS — I611 Nontraumatic intracerebral hemorrhage in hemisphere, cortical: Secondary | ICD-10-CM | POA: Diagnosis not present

## 2022-04-01 LAB — MAGNESIUM: Magnesium: 1.8 mg/dL (ref 1.7–2.4)

## 2022-04-01 LAB — BASIC METABOLIC PANEL
Anion gap: 8 (ref 5–15)
BUN: 20 mg/dL (ref 8–23)
CO2: 24 mmol/L (ref 22–32)
Calcium: 9.4 mg/dL (ref 8.9–10.3)
Chloride: 123 mmol/L — ABNORMAL HIGH (ref 98–111)
Creatinine, Ser: 0.74 mg/dL (ref 0.44–1.00)
GFR, Estimated: >60 mL/min (ref >60)
Glucose, Bld: 108 mg/dL — ABNORMAL HIGH (ref 70–99)
Potassium: 2.9 mmol/L — ABNORMAL LOW (ref 3.5–5.1)
Sodium: 155 mmol/L — ABNORMAL HIGH (ref 135–145)

## 2022-04-01 LAB — CBC
HCT: 39.1 % (ref 36.0–46.0)
Hemoglobin: 13 g/dL (ref 12.0–15.0)
MCH: 30.2 pg (ref 26.0–34.0)
MCHC: 33.2 g/dL (ref 30.0–36.0)
MCV: 90.9 fL (ref 80.0–100.0)
Platelets: 176 10*3/uL (ref 150–400)
RBC: 4.3 MIL/uL (ref 3.87–5.11)
RDW: 14.1 % (ref 11.5–15.5)
WBC: 11.9 10*3/uL — ABNORMAL HIGH (ref 4.0–10.5)
nRBC: 0 % (ref 0.0–0.2)

## 2022-04-01 LAB — GLUCOSE, CAPILLARY
Glucose-Capillary: 112 mg/dL — ABNORMAL HIGH (ref 70–99)
Glucose-Capillary: 113 mg/dL — ABNORMAL HIGH (ref 70–99)
Glucose-Capillary: 128 mg/dL — ABNORMAL HIGH (ref 70–99)
Glucose-Capillary: 145 mg/dL — ABNORMAL HIGH (ref 70–99)

## 2022-04-01 LAB — SODIUM
Sodium: 154 mmol/L — ABNORMAL HIGH (ref 135–145)
Sodium: 154 mmol/L — ABNORMAL HIGH (ref 135–145)

## 2022-04-01 MED ORDER — OSMOLITE 1.5 CAL PO LIQD
1000.0000 mL | ORAL | Status: DC
  Administered 2022-04-01 – 2022-04-07 (×3): 1000 mL
  Filled 2022-04-01 (×4): qty 1000

## 2022-04-01 MED ORDER — HEPARIN SODIUM (PORCINE) 5000 UNIT/ML IJ SOLN
5000.0000 [IU] | Freq: Three times a day (TID) | INTRAMUSCULAR | Status: DC
  Administered 2022-04-01 – 2022-04-16 (×43): 5000 [IU] via SUBCUTANEOUS
  Filled 2022-04-01 (×43): qty 1

## 2022-04-01 MED ORDER — POTASSIUM CHLORIDE 20 MEQ PO PACK
40.0000 meq | PACK | ORAL | Status: AC
  Administered 2022-04-01 (×3): 40 meq
  Filled 2022-04-01 (×3): qty 2

## 2022-04-01 MED ORDER — MAGNESIUM SULFATE 2 GM/50ML IV SOLN
2.0000 g | Freq: Once | INTRAVENOUS | Status: AC
Start: 2022-04-01 — End: 2022-04-01
  Administered 2022-04-01: 2 g via INTRAVENOUS
  Filled 2022-04-01: qty 50

## 2022-04-01 MED ORDER — QUETIAPINE FUMARATE 25 MG PO TABS: 12.5000 mg | ORAL_TABLET | Freq: Every day | ORAL | Status: DC

## 2022-04-01 MED ORDER — QUETIAPINE FUMARATE 25 MG PO TABS
12.5000 mg | ORAL_TABLET | Freq: Every day | ORAL | Status: DC
  Administered 2022-04-01: 12.5 mg
  Filled 2022-04-01: qty 1

## 2022-04-01 MED ORDER — PROSOURCE TF20 ENFIT COMPATIBL EN LIQD
60.0000 mL | Freq: Every day | ENTERAL | Status: DC
  Administered 2022-04-01 – 2022-04-07 (×6): 60 mL
  Filled 2022-04-01 (×6): qty 60

## 2022-04-01 NOTE — Progress Notes (Signed)
Neurosurgery Service Progress Note  Subjective: No acute events overnight   Objective: Vitals:   04/01/22 0900 04/01/22 1000 04/01/22 1100 04/01/22 1200  BP: (!) 152/57 (!) 165/65 (!) 142/69 (!) 130/110  Pulse: (!) 53 69    Resp: 20 (!) '26 18 19  '$ Temp: 99 F (37.2 C) 99.1 F (37.3 C) 99.3 F (37.4 C) 99.3 F (37.4 C)  TempSrc:      SpO2: 97% 98%    Weight:      Height:        Physical Exam: Awake/alert, PERRL, speech partially aphasic but follows commands x4  Assessment & Plan: 66 y.o. woman s/p ICH, CTA w/ AVF vs AVM.  -Dr. Kathyrn Sheriff will be by to see the patient tomorrow to eval for angio timing -EVD draining, Dr. Arnoldo Morale will eval in AM to determine plan for removal   Judith Part  04/01/22 1:09 PM

## 2022-04-01 NOTE — Progress Notes (Signed)
PT Cancellation Note  Patient Details Name: [name] Giles MRN: [id number] DOB: 02/04/1956   Cancelled Treatment:    Reason Eval/Treat Not Completed: Other (comment). RN requesting hold on PT due to pt agitation. Will plan to follow-up tomorrow as able.   Moishe Spice, PT, DPT Acute Rehabilitation Services  Office: [place] 04/01/2022, 9:13 AM

## 2022-04-01 NOTE — Progress Notes (Signed)
NAME:  [name] Giles, MRN:  [id number], DOB:  12-28-1955, LOS: 3 ADMISSION DATE:  03/29/2022, CONSULTATION DATE: 03/31/2022 REFERRING MD: Dr. Leonie Man, CHIEF COMPLAINT: Encephalopathy  History of Present Illness:  66 year old woman with history of tobacco use (30 pack years) and COPD/asthma.  She was admitted 8/10 with severe headache, N/V, involving decreased mental status.  Head CT in the [place] ED showed large left frontotemporal ICH with IVH and 5 mm midline shift.  Started on Cleviprex for hypertension, moved to [facility] for further care.  Repeat head CT with increasing hemorrhage and associated hydrocephalus.  Ventriculostomy placed on 8/10.  Subsequent course complicated by persistent hypertension as well as agitated delirium.  Precedex started on 8/11   Pertinent  Medical History   Past Medical History:  Diagnosis Date   Asthma    Medical history non-contributory     Significant Hospital Events: Including procedures, antibiotic start and stop dates in addition to other pertinent events   8/10 presented with encephalopathy, suppressed mental status in the setting of large left frontotemporal ICH plus IVH 8/10 ventriculostomy placed (Dr. Arnoldo Morale) 8/10 MRI brain > 68 cc left temporal ICH, surrounding vasogenic edema, 1 cm left right shift.  Heterogeneous parenchyma along the lateral margin of the hemorrhage, question underlying vascular lesion.  Intraventricular extension, ventriculostomy in place 8/11 CT angio head and neck > unchanged large ICH left temporal lobe with surrounding edema.  Stable mass effect 10 mm.  Unchanged IVH with been trach in place.  Patent internal carotid, vertebral arteries.  Cluster of abnormal tortuous vessels on the lateral aspect of the left temporal lobe compatible with an AVF or AVM 8/11 Precedex for agitated delirium 8/12 repeat head CT > stable IVH.  Stable right shift, estimated 9 mm.  No new findings  Interim History / Subjective:   Episode of fairly  significant agitation on 8/12 when her Precedex was weaned off, now back on 0.4.  Agitation fairly well controlled overnight, minimal. I/O- 1.2 L total Sodium 155, potassium 2.9   Objective   Blood pressure (!) 150/55, pulse (!) 59, temperature 99 F (37.2 C), resp. rate 19, height '5\' 5"'$  (1.651 m), weight 66.2 kg, SpO2 97 %.        Intake/Output Summary (Last 24 hours) at 04/01/2022 4132 Last data filed at 04/01/2022 0700 Gross per 24 hour  Intake 771.52 ml  Output 1792 ml  Net -1020.48 ml   Filed Weights   03/29/22 2000  Weight: 66.2 kg    Examination: General: Ill-appearing woman, no distress HENT: Ventricular drain in place, incision clean and dry Lungs: Distant, clear, no wheeze Cardiovascular: Regular, no murmur Abdomen: Nondistended, positive bowel sounds Extremities: No edema Neuro: Eyes open.  She will track.  Answers some questions and will follow simple commands.  Some perseveration.  No current agitation   Resolved Hospital Problem list     Assessment & Plan:   Large left temporal ICH, IVH with hydrocephalus.  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  No significant alcohol use per the patient's son -Continue low-dose Precedex, stable at 0.4 -Frequent neurochecks -Benzo available if needed for breakthrough agitation   Hypokalemia Iatrogenic hypernatremia -  replace K+ -follow freq Na  COPD/asthma.  Continued tobacco use -Albuterol available as needed -On Symbicort as an outpatient, could consider transition to LABA/LAMA in preparation for  discharge -Smoking cessation counseling -Nicotine patch if she needs it  Nutrition -Currently n.p.o. -Consider initiation TF on 8/13  Best Practice (right click and "Reselect all SmartList Selections" daily)   Diet/type: NPO DVT prophylaxis: SCD GI prophylaxis: PPI Lines: N/A Foley:  Yes, and it is still needed Code Status:  full code Last date of multidisciplinary goals of care discussion [pending] Family: Discussed plans, patient status with the patient's son at bedside on 8/12  Labs   CBC: Recent Labs  Lab 03/29/22 1533 03/31/22 0608 04/01/22 0616  WBC 9.3 10.3 11.9*  NEUTROABS 5.5  --   --   HGB 14.5 12.1 13.0  HCT 44.8 36.7 39.1  MCV 91.6 92.0 90.9  PLT 207 182 211    Basic Metabolic Panel: Recent Labs  Lab 03/30/22 0600 03/30/22 1200 03/31/22 0228 03/31/22 0608 03/31/22 1129 03/31/22 2146 04/01/22 0616  NA 148*   < > 156* 156* 159* 155* 155*  K 4.0  --   --  3.1*  --   --  2.9*  CL 121*  --   --  129*  --   --  123*  CO2 24  --   --  23  --   --  24  GLUCOSE 121*  --   --  123*  --   --  108*  BUN 7*  --   --  11  --   --  20  CREATININE 0.55  --   --  0.60  --   --  0.74  CALCIUM 8.3*  --   --  8.9  --   --  9.4  MG  --   --   --   --   --   --  1.8   < > = values in this interval not displayed.   GFR: Estimated Creatinine Clearance: 62.2 mL/min (by C-G formula based on SCr of 0.74 mg/dL). Recent Labs  Lab 03/29/22 1533 03/31/22 0608 04/01/22 0616  WBC 9.3 10.3 11.9*    Liver Function Tests: No results for input(s): "AST", "ALT", "ALKPHOS", "BILITOT", "PROT", "ALBUMIN" in the last 168 hours. Recent Labs  Lab 03/29/22 1533  LIPASE 39   Recent Labs  Lab 03/29/22 1538  AMMONIA 20    ABG    Component Value Date/Time   HCO3 31.4 (H) 03/29/2022 1533   O2SAT 54.1 03/29/2022 1533     Coagulation Profile: Recent Labs  Lab 03/30/22 0209  INR 1.0    Cardiac Enzymes: Recent Labs  Lab 03/29/22 1533  CKTOTAL 61    HbA1C: Hgb  A1c MFr Bld  Date/Time Value Ref Range Status  03/29/2022 08:40 PM 5.7 (H) 4.8 - 5.6 % Final    Comment:    (NOTE)         Prediabetes: 5.7 - 6.4         Diabetes: >6.4         Glycemic control for adults with diabetes: <7.0     CBG: Recent Labs  Lab 03/31/22 0612 03/31/22 1107 03/31/22 1755 03/31/22 2210 04/01/22 0312  GLUCAP 124* 130* 107* 96 112*     Critical care time: 31 min     Baltazar Apo, MD, PhD 04/01/2022, 8:12 AM [facility] [phone] or if no answer before 7:00PM call [phone] For any  issues after 7:00PM please call eLink [phone]

## 2022-04-01 NOTE — Progress Notes (Addendum)
STROKE TEAM PROGRESS NOTE   INTERVAL HISTORY  No family at the bedside. Precedex 0.69mg, hard to wean precedex due to agitation will  Start seroquel tonight 12.'5mg'$  to wean off precedex. Lethargic following commands. Vertical gaze limitations, able to move horizontally. Right side weak. Right leg less movement. EVD with 148cc output over the last 24 hrs. Last NA 155.  And 3% drip has been off for nearly 24 hours.  Change BP goal < 160. Will start heparin SQ today. K 2.9 being replaced. TF to start today.  RN at bedside  Vitals:   04/01/22 0300 04/01/22 0400 04/01/22 0500 04/01/22 0600  BP: (!) 149/65 (!) 148/62 (!) 154/64 (!) 150/55  Pulse: 72 61 61 (!) 59  Resp: (!) 23 (!) 22 (!) 21 19  Temp: 99.5 F (37.5 C) 99.3 F (37.4 C) 99.1 F (37.3 C) 99 F (37.2 C)  TempSrc:      SpO2: 96% 98% 97% 97%  Weight:      Height:       CBC:  Recent Labs  Lab 03/29/22 1533 03/31/22 0608 04/01/22 0616  WBC 9.3 10.3 11.9*  NEUTROABS 5.5  --   --   HGB 14.5 12.1 13.0  HCT 44.8 36.7 39.1  MCV 91.6 92.0 90.9  PLT 207 182 1093   Basic Metabolic Panel:  Recent Labs  Lab 03/31/22 0608 03/31/22 1129 03/31/22 2146 04/01/22 0616  NA 156*   < > 155* 155*  K 3.1*  --   --  2.9*  CL 129*  --   --  123*  CO2 23  --   --  24  GLUCOSE 123*  --   --  108*  BUN 11  --   --  20  CREATININE 0.60  --   --  0.74  CALCIUM 8.9  --   --  9.4  MG  --   --   --  1.8   < > = values in this interval not displayed.    Lipid Panel:  Recent Labs  Lab 03/29/22 2040  CHOL 190  TRIG 28  HDL 65  CHOLHDL 2.9  VLDL 6  LDLCALC 119*    HgbA1c:  Recent Labs  Lab 03/29/22 2040  HGBA1C 5.7*    Urine Drug Screen:  Recent Labs  Lab 03/29/22 1654  LABOPIA NONE DETECTED  COCAINSCRNUR NONE DETECTED  LABBENZ NONE DETECTED  AMPHETMU NONE DETECTED  THCU NONE DETECTED  LABBARB NONE DETECTED     Alcohol Level  Recent Labs  Lab 03/29/22 1533  ETH <10     IMAGING past 24 hours CT HEAD WO  CONTRAST (5MM)  Result Date: 03/31/2022 CLINICAL DATA:  67year old female presenting with left temporal lobe hemorrhage, IVH and EVD. 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 Subsequent encounter. EXAM: CT HEAD WITHOUT CONTRAST TECHNIQUE: Contiguous axial images were obtained from the base of the skull through the vertex without intravenous contrast. RADIATION DOSE REDUCTION: This exam was performed according to the departmental dose-optimization program which includes automated exposure control, adjustment of the mA and/or kV according to patient size and/or use of iterative reconstruction technique. COMPARISON:  CTA head and neck, brain MRI 03/30/2022 and earlier. FINDINGS: Brain: Large intra-axial hemorrhage in the left temporal lobe (68 mL volume estimation) with surrounding edema has not significantly changed in size or configuration from yesterday. Moderate volume intraventricular hemorrhage and mild to moderate enlargement of the left lateral ventricle appears stable. Stable right frontal approach external ventricular drain  terminating near the septum pellucidum. Stable ventricle size and configuration. Stable rightward midline shift of 9 mm at the anterior septum pellucidum. Basilar cisterns remain patent. No other extra-axial extension of blood identified. Stable gray-white matter differentiation throughout the brain. Vascular: Calcified atherosclerosis at the skull base. Skull: Stable right frontal burr hole. No acute osseous abnormality identified. Sinuses/Orbits: Visualized paranasal sinuses and mastoids are stable and well aerated. Other: Left nasoenteric tube now in place. Right frontal approach EVD remains. No acute orbit or scalp soft tissue finding. IMPRESSION: 1. Stable large left temporal lobe hemorrhage and edema since yesterday. Stable intraventricular hemorrhage and EVD along with mild to moderate enlargement of the left temporal lobe. 2. Stable intracranial mass effect with up to  9 mm rightward midline shift. Basilar cisterns remain patent. 3. No new intracranial abnormality. Electronically Signed   By: Genevie Ann M.D.   On: 03/31/2022 09:40    PHYSICAL EXAM 66 yo female lying in bed, resting quietly with EVD place.  She is spontaneously moving all extremities with bilateral mitten restraints in place.  Somnolent, does not keep eyes open. Opens eyes briefly to voice,   Now following some simple commands but not reliably.  Mixed receptive and expressive aphasia and can follow simple midline and some one-step commands only.  Speech at times difficult to understand at times clear. Gaze deviation to left but able to cross midline to right.  Cranial nerves: PERRL, does not blink to threat consistently from the right, face + right droop.  Motor examination improved today with patient able to elevate bilat UE off bed and hold with right drift. Holds bilat LE off bed briefly to command.  Sensation:  Weaker withdrawal on the right in comparison to the left lower stimulation Coordination is not able to be assessed due to somnolence.    ASSESSMENT/PLAN 66 year old female with history of COPD, smoker admitted for headache, nausea vomiting, found down in bathroom with decreased level consciousness.  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  CT repeat showed mildly increase in size.  NSG consulted.  Dr. Arnoldo Morale on board.  Status post EVD.  MRI with and without contrast showed 68 cc ICH, with IVH and midline shift 50m.  Repeat CT in a.m today showed stable hematoma but midline shift up to 1 cm.  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  CTV no this thrombosis.  EF 60 to 65%, LDL 119, A1c 5.7, UDS negative. 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  Impending left  uncal herniation.  Midline shift 5 mm Repeat HCT 2 hours after Interval increase in size of Left IPH. New IPH within the left uncus/medial temporal lobe.  MRI  03/29/22 - 68 mL intraparenchymal hemorrhage centered at the left temporal lobe, similar in size as compared to prior CT. midline shift 7 mm CT repeat 03/30/22 stable hematoma but increased midline shift to 1 cm CTA Head and Neck Cluster of abnormal, tortuous vessels along the lateral aspect of the left temporal lobe compatible with an AVF for AVM.  CT venogram negative for venous thrombosis Repeat HCT 8/12 showed Stable large left temporal lobe hemorrhage and edema since yesterday. Stable intraventricular hemorrhage and EVD along with mild to moderate enlargement of the left temporal lobe.Stable intracranial mass  effect with up to 9 mm rightward midline shift. Basilar cisterns remain patent. No new intracranial abnormality 2D Echo EF 60-65%, No shunt or thrombus detected LDL 119 HgbA1c 5.7 VTE prophylaxis - SCDs No antiplatelet or anticoagulants prior to admission per chart review, now on no antithrombotics due to [place]. Therapy recommendations:  PENDING  Disposition:  TBD  Cerebral Edema Obstructive hydrocephalus On 3% saline on hold, s/p PICC placement Na 139->144->148->152->156->155 Neurosurgery consulted advised no surgical intervention at this time.  EVD placed. NSG is following which is appreciated.  On precedex for agitation Continue frequent neuro checks per ICU protocol CCM asked to consult today to assist with management which is appreciated.  Left temporal AVF/ AVM Neurosurgery following CTA more clearly showing this as likely source of hemorrhage Dr. Leonie Man discussed with neurosurgery. Plan for angiogram next week and definitive treatment of AVM by Dr. Kathyrn Sheriff.  HTN No hx of hypertension or home BP meds prior to admission Unstable   cleviprex drip off. Now on norvasc '10mg'$ , lisinopril '20mg'$ ,  BP goal  <160  Hyperlipidemia Home meds:   LDL 119, goal < 70 High intensity statin to be considered at discharge in setting of hemorrhagic stroke   Dysphagia Ok to start tube feeding per RD recs Core track placed On IVF, wean off  on feeding started and tolerated   Elimination Foley catheter in place, high risk for urinary retention due to AMS Consider bowel regimen when appropriate  Tobacco abuse Current smoker 1PPD, 30 pack year hx Smoking cessation counseling will be provided  Other Stroke Risk Factors Advanced Age >/= 70  Current ETOH use, alcohol level <10, will be advised to drink no more than 1 drink a day Incidentally noted previous cerebellar stroke   Hypokalemia Potassium repleted. Continue to monitor   Other [facility] day # 3 This patient was seen and evaluated with Leonie Man. He directed the plan of care.   Beulah Gandy DNP, ACNPC-AG .  I have personally obtained history,examined this patient, reviewed notes, independently viewed imaging studies, participated in medical decision making and plan of care.ROS completed by me personally and pertinent positives fully documented  I have made any additions or clarifications directly to the above note. Agree with note above. Patient remains lethargic but gets agitated requiring sedation and hence neurological exam is limited but she appears aphasic with right hemiplegia.  Ventriculostomy catheter is draining well.  Serum sodium is at goal and hypertonic saline drip is on hold currently.  Blood pressure adequately controlled.  Neurosurgery plans cerebral angio next week to determine definitive treatment plan for AVM/AVF.  Start tube feeds.  Start subcu heparin for DVT prophylaxis.  Long discussion with sister at the bedside and answered questions. Discussed with Dr. Lamonte Sakai critical care medicine. This patient is critically ill and at significant risk of neurological worsening, death and care requires constant monitoring  of vital signs, hemodynamics,respiratory and cardiac monitoring, extensive review of multiple databases, frequent neurological assessment, discussion with family, other specialists and medical decision making of high complexity.I have made any additions or clarifications directly to the above note.This critical care time does not reflect procedure time, or teaching time or supervisory time of PA/NP/Med Resident etc but could involve care discussion time.  I spent 34 minutes of neurocritical care time  in the care of  this patient.     Antony Contras, MD Medical Director [facility] Pager: [phone] 04/01/2022 1:39 PM To contact Stroke Continuity provider, please refer to [email or web address]  After hours, contact General Neurology

## 2022-04-02 DIAGNOSIS — I611 Nontraumatic intracerebral hemorrhage in hemisphere, cortical: Secondary | ICD-10-CM | POA: Diagnosis not present

## 2022-04-02 LAB — BASIC METABOLIC PANEL
Anion gap: 5 (ref 5–15)
BUN: 26 mg/dL — ABNORMAL HIGH (ref 8–23)
CO2: 27 mmol/L (ref 22–32)
Calcium: 8.7 mg/dL — ABNORMAL LOW (ref 8.9–10.3)
Chloride: 121 mmol/L — ABNORMAL HIGH (ref 98–111)
Creatinine, Ser: 0.43 mg/dL — ABNORMAL LOW (ref 0.44–1.00)
GFR, Estimated: >60 mL/min (ref >60)
Glucose, Bld: 141 mg/dL — ABNORMAL HIGH (ref 70–99)
Potassium: 3.3 mmol/L — ABNORMAL LOW (ref 3.5–5.1)
Sodium: 153 mmol/L — ABNORMAL HIGH (ref 135–145)

## 2022-04-02 LAB — SODIUM
Sodium: 152 mmol/L — ABNORMAL HIGH (ref 135–145)
Sodium: 153 mmol/L — ABNORMAL HIGH (ref 135–145)

## 2022-04-02 LAB — CBC
HCT: 38.1 % (ref 36.0–46.0)
Hemoglobin: 12.5 g/dL (ref 12.0–15.0)
MCH: 30 pg (ref 26.0–34.0)
MCHC: 32.8 g/dL (ref 30.0–36.0)
MCV: 91.6 fL (ref 80.0–100.0)
Platelets: 177 10*3/uL (ref 150–400)
RBC: 4.16 MIL/uL (ref 3.87–5.11)
RDW: 13.9 % (ref 11.5–15.5)
WBC: 9.8 10*3/uL (ref 4.0–10.5)
nRBC: 0 % (ref 0.0–0.2)

## 2022-04-02 LAB — GLUCOSE, CAPILLARY
Glucose-Capillary: 101 mg/dL — ABNORMAL HIGH (ref 70–99)
Glucose-Capillary: 139 mg/dL — ABNORMAL HIGH (ref 70–99)

## 2022-04-02 LAB — PHOSPHORUS: Phosphorus: 3.4 mg/dL (ref 2.5–4.6)

## 2022-04-02 LAB — MAGNESIUM: Magnesium: 2.1 mg/dL (ref 1.7–2.4)

## 2022-04-02 MED ORDER — OXYCODONE HCL 5 MG PO TABS
5.0000 mg | ORAL_TABLET | ORAL | Status: DC | PRN
  Administered 2022-04-02 (×3): 5 mg
  Filled 2022-04-02 (×3): qty 1

## 2022-04-02 MED ORDER — QUETIAPINE FUMARATE 25 MG PO TABS
25.0000 mg | ORAL_TABLET | Freq: Every day | ORAL | Status: DC
  Administered 2022-04-02: 25 mg
  Filled 2022-04-02: qty 1

## 2022-04-02 MED ORDER — POTASSIUM CHLORIDE 20 MEQ PO PACK
40.0000 meq | PACK | ORAL | Status: AC
  Administered 2022-04-02 (×2): 40 meq
  Filled 2022-04-02 (×2): qty 2

## 2022-04-02 MED ORDER — BETHANECHOL CHLORIDE 10 MG PO TABS
10.0000 mg | ORAL_TABLET | Freq: Three times a day (TID) | ORAL | Status: DC
  Administered 2022-04-02 – 2022-04-07 (×15): 10 mg
  Filled 2022-04-02 (×14): qty 1

## 2022-04-02 NOTE — Progress Notes (Signed)
Patient increasingly agitated despite receiving ativan X2 and prn pain medication. Patient attempting to remove restraints and exit bed and is difficult to reorient. HR up into 140s. Low dose Precedex restarted at 2330

## 2022-04-02 NOTE — Evaluation (Signed)
Physical Therapy Evaluation Patient Details Name: [name] Giles MRN: [id number] DOB: 01/11/1956 Today's Date: 04/02/2022  History of Present Illness  [name] Giles is a 66 y.o. female presenting 37/10 after being found on the bathroom at work, head CT revealed a large left frontotemporal ICH with IVH and 5 mm midline shift and ballooning of the temporal horns concerning for obstructive hydrocephalus s/p placement of right frontal ventriculostomy via bur hole 8/10. PHMx: COPD, tobacco abuse and asthma.   Clinical Impression  Pt in bed upon arrival of PT, agreeable to evaluation at this time. Prior to admission the pt was independent with all mobility, working full time as a Freight forwarder at [employer]. The pt now presents with limitations in functional mobility, strength, coordination, cognition, awareness, balance, and endurance due to above dx, and will continue to benefit from skilled PT to address these deficits. The pt required mod-maxA of 1-2 to complete all bed mobility, sit-stand transfers, and pivotal steps to [facility] at this time due to poor cognition and weakness. The pt was able to follow simple cues and instructions but demos poor orientation, awareness, and often requires repeated cues to maintain activity. Will continue to benefit from skilled PT acutely and acute inpatient rehab after d/c to facilitate return to prior level of independence.         Recommendations for follow up therapy are one component of a multi-disciplinary discharge planning process, led by the attending physician.  Recommendations may be updated based on patient status, additional functional criteria and insurance authorization.  Follow Up Recommendations Acute inpatient rehab (3hours/day)      Assistance Recommended at Discharge Frequent or constant Supervision/Assistance  Patient can return home with the following  Two people to help with walking and/or transfers;A lot of help with  bathing/dressing/bathroom;Assistance with feeding;Assistance with cooking/housework;Direct supervision/assist for medications management;Direct supervision/assist for financial management;Assist for transportation;Help with stairs or ramp for entrance    Equipment Recommendations  (defer to post acute)  Recommendations for Other Services  Rehab consult    Functional Status Assessment Patient has had a recent decline in their functional status and demonstrates the ability to make significant improvements in function in a reasonable and predictable amount of time.     Precautions / Restrictions Precautions Precautions: Fall Precaution Comments: cortrack, EVD Restrictions Weight Bearing Restrictions: No      Mobility  Bed Mobility Overal bed mobility: Needs Assistance Bed Mobility: Supine to Sit     Supine to sit: Mod assist, +2 for safety/equipment     General bed mobility comments: max cues for LE movement, modA to trunk, then modA to scoot to EOB    Transfers Overall transfer level: Needs assistance Equipment used: 1 person hand held assist, 2 person hand held assist Transfers: Sit to/from Stand, Bed to chair/wheelchair/BSC Sit to Stand: Mod assist, +2 safety/equipment   Step pivot transfers: Mod assist, +2 safety/equipment       General transfer comment: modA of 2 to rise and manage steps. minimal step clearance, maxA to guide movement    Ambulation/Gait Ambulation/Gait assistance: Mod assist Gait Distance (Feet): 5 Feet Assistive device: 1 person hand held assist Gait Pattern/deviations: Decreased stride length, Decreased step length - left, Step-to pattern, Shuffle   Gait velocity interpretation: <1.31 ft/sec, indicative of household ambulator   General Gait Details: pt with slowed steps and dependent on therapist to facilitate movement. No buckling noted with mobility. limited by fatigue  Stairs  Wheelchair Mobility    Modified Rankin  (Stroke Patients Only) Modified Rankin (Stroke Patients Only) Pre-Morbid Rankin Score: No symptoms Modified Rankin: Moderately severe disability     Balance Overall balance assessment: Needs assistance Sitting-balance support: No upper extremity supported Sitting balance-Leahy Scale: Fair     Standing balance support: Bilateral upper extremity supported Standing balance-Leahy Scale: Poor                               Pertinent Vitals/Pain Pain Assessment Pain Assessment: Faces Faces Pain Scale: Hurts a little bit Pain Descriptors / Indicators: Discomfort Pain Intervention(s): Monitored during session, Repositioned    Home Living Family/patient expects to be discharged to:: Private residence Living Arrangements: Children (78 yo son; grandchild every other week) Available Help at Discharge: Family;Available PRN/intermittently Type of Home: House Home Access: Stairs to enter Entrance Stairs-Rails: Psychiatric nurse of Steps: 4   Home Layout: One level Home Equipment: None Additional Comments: thresholds/small changes in height throughout the house    Prior Function Prior Level of Function : Independent/Modified Independent;Working/employed;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, newspaper   Dominant Hand: Right    Extremity/Trunk Assessment   Upper Extremity Assessment Upper Extremity Assessment: Defer to OT evaluation    Lower Extremity Assessment Lower Extremity Assessment: Generalized weakness (moving BLE spontaneously, no buckling noted in stance but pt needing assist to rise.)    Cervical / Trunk Assessment Cervical / Trunk Assessment: Other exceptions  Communication   Communication: Expressive difficulties (difficult to understand at times; language not making sense at times)  Cognition Arousal/Alertness: Lethargic Behavior During Therapy: Flat affect, Impulsive, Restless Overall  Cognitive Status: Impaired/Different from baseline Area of Impairment: Attention, Orientation, Safety/judgement, Following commands, Awareness, Problem solving                 Orientation Level: Disoriented to, Place, Time, Situation Current Attention Level: Focused   Following Commands: Follows one step commands inconsistently Safety/Judgement: Decreased awareness of safety, Decreased awareness of deficits Awareness: Intellectual Problem Solving: Slow processing, Decreased initiation, Difficulty sequencing General Comments: Will further assess        General Comments General comments (skin integrity, edema, etc.): VSS on RA        Assessment/Plan    PT Assessment Patient needs continued PT services  PT Problem List Decreased strength;Decreased balance;Decreased activity tolerance;Decreased mobility;Decreased coordination;Decreased cognition;Decreased safety awareness       PT Treatment Interventions DME instruction;Gait training;Stair training;Functional mobility training;Therapeutic activities;Therapeutic exercise;Balance training;Neuromuscular re-education;Patient/family education    PT Goals (Current goals can be found in the Care Plan section)  Acute Rehab PT Goals Patient Stated Goal: none stated PT Goal Formulation: Patient unable to participate in goal setting Time For Goal Achievement: 04/16/22 Potential to Achieve Goals: Good    Frequency Min 4X/week     Co-evaluation PT/OT/SLP Co-Evaluation/Treatment: Yes Reason for Co-Treatment: Complexity of the patient's impairments (multi-system involvement);Necessary to address cognition/behavior during functional activity;For patient/therapist safety;To address functional/ADL transfers PT goals addressed during session: Mobility/safety with mobility;Balance;Strengthening/ROM         AM-PAC PT "6 Clicks" Mobility  Outcome Measure Help needed turning from your back to your side while in a flat bed without using  bedrails?: A Little Help needed moving from lying on your back to sitting on the side of a flat bed without using  bedrails?: A Lot Help needed moving to and from a bed to a chair (including a wheelchair)?: Total Help needed standing up from a chair using your arms (e.g., wheelchair or bedside chair)?: Total Help needed to walk in hospital room?: Total Help needed climbing 3-5 steps with a railing? : Total 6 Click Score: 9    End of Session Equipment Utilized During Treatment: Gait belt Activity Tolerance: Patient limited by fatigue Patient left: in bed;with call bell/phone within reach;with bed alarm set;with restraints reapplied;with family/visitor present Nurse Communication: Mobility status PT Visit Diagnosis: Unsteadiness on feet (R26.81);Other abnormalities of gait and mobility (R26.89);Repeated falls (R29.6);Muscle weakness (generalized) (M62.81)    Time: 5631-4970 PT Time Calculation (min) (ACUTE ONLY): 34 min   Charges:   PT Evaluation $PT Eval Moderate Complexity: 1 Mod          West Carbo, PT, DPT   Acute Rehabilitation Department  Sandra Cockayne 04/02/2022, 4:34 PM

## 2022-04-02 NOTE — Progress Notes (Addendum)
STROKE TEAM PROGRESS NOTE   INTERVAL HISTORY Sister at the bedside. Nurse reports EVD with 5-10cc output per hour over the last 24 hrs with EVD open to drain. Last Na 153, now off 3% drip for 48 hours. On heparin SQ.   Off sedation on [place]. Alert and speaking full sentences, nonfluent with mixed expressive and receptive aphasia. States "that hurts" with noxious stimuli. Gaze able to move horizontally.  Moves all 4 extremities well without focal weakness.  Blood pressure adequately controlled head normal.  Vitals:   04/02/22 0800 04/02/22 0900 04/02/22 1000 04/02/22 1141  BP: 114/79 (!) 123/59 (!) 111/58   Pulse: 83 70 70   Resp: '16 18 19   '$ Temp: 99 F (37.2 C) 99.5 F (37.5 C) 99.3 F (37.4 C) 99.3 F (37.4 C)  TempSrc: Bladder Bladder  Axillary  SpO2: 98% 96% 96%   Weight:      Height:       CBC:  Recent Labs  Lab 03/29/22 1533 03/31/22 0608 04/01/22 0616 04/02/22 0602  WBC 9.3   < > 11.9* 9.8  NEUTROABS 5.5  --   --   --   HGB 14.5   < > 13.0 12.5  HCT 44.8   < > 39.1 38.1  MCV 91.6   < > 90.9 91.6  PLT 207   < > 176 177   < > = values in this interval not displayed.   Basic Metabolic Panel:  Recent Labs  Lab 04/01/22 0616 04/01/22 1158 04/02/22 0030 04/02/22 0602  NA 155*   < > 152* 153*  K 2.9*  --   --  3.3*  CL 123*  --   --  121*  CO2 24  --   --  27  GLUCOSE 108*  --   --  141*  BUN 20  --   --  26*  CREATININE 0.74  --   --  0.43*  CALCIUM 9.4  --   --  8.7*  MG 1.8  --   --  2.1  PHOS  --   --   --  3.4   < > = values in this interval not displayed.   Lipid Panel:  Recent Labs  Lab 03/29/22 2040  CHOL 190  TRIG 28  HDL 65  CHOLHDL 2.9  VLDL 6  LDLCALC 119*   HgbA1c:  Recent Labs  Lab 03/29/22 2040  HGBA1C 5.7*   Urine Drug Screen:  Recent Labs  Lab 03/29/22 1654  LABOPIA NONE DETECTED  COCAINSCRNUR NONE DETECTED  LABBENZ NONE DETECTED  AMPHETMU NONE DETECTED  THCU NONE DETECTED  LABBARB NONE DETECTED    Alcohol Level  Recent  Labs  Lab 03/29/22 1533  ETH <10    IMAGING past 24 hours No results found.  PHYSICAL EXAM 66 yo female lying in bed with EVD in place. Sedation discontinued prior to exam. She is spontaneously moving all extremities with left mitten restraint in place.  Alert, opens eyes to voice. Following simple commands. Not oriented to situation, time. Nonfluent speech with mixed receptive and expressive aphasia.  Follows simple midline and few one-step commands Gaze preference to left but able to cross midline to right.  Cranial nerves: PERRL. Motor examination improved today with patient able to elevate bilat UE off bed. Holds bilat LE off bed briefly to command.    ASSESSMENT/PLAN 66 year old female with history of COPD, smoker admitted for headache, nausea, vomiting, found down in bathroom with decreased level consciousness.  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  CT repeat showed mild increase in size. NSG consulted. Dr. Arnoldo Morale on board. Status post EVD. MRI with and without contrast showed 68 cc ICH, with IVH and midline shift 61m.  Repeat CT showed stable hematoma but midline shift up to 1 cm.  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  CTV no this thrombosis.  EF 60 to 65%, LDL 119, A1c 5.7, UDS negative.  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  Impending left uncal herniation.  Midline shift 5 mm Repeat HCT 2 hours after Interval increase in size of Left IPH. New IPH within the left uncus/medial temporal lobe.  MRI  03/29/22 - 68 mL intraparenchymal hemorrhage centered at the left temporal lobe, similar in size as compared to prior CT. midline shift 7 mm CT repeat 03/30/22 stable hematoma but increased midline shift to 1 cm CTA Head and Neck Cluster of abnormal, tortuous vessels along the lateral  aspect of the left temporal lobe compatible with an AVF for AVM.  CT venogram negative for venous thrombosis Repeat HCT 8/12 showed Stable large left temporal lobe hemorrhage and edema since yesterday. Stable intraventricular hemorrhage and EVD along with mild to moderate enlargement of the left temporal lobe.Stable intracranial mass effect with up to 9 mm rightward midline shift. Basilar cisterns remain patent. No new intracranial abnormality 2D Echo EF 60-65%, No shunt or thrombus detected LDL 119 HgbA1c 5.7 VTE prophylaxis - Heparin No antiplatelet or anticoagulants prior to admission per chart review, now on no antithrombotics due to IBell Therapy recommendations:  PENDING  Disposition:  TBD  Cerebral Edema Obstructive hydrocephalus 8/10: started 3% HTS  8/12: stopped 3% HTS Na 153 this AM EVD placed. NSG is following.  Continue frequent neuro checks per ICU protocol CCM following, appreciate their help.  Agitated delirium Was previously on low-dose precedex.  Discontinued this AM, patient more alert.   Left temporal AVF/ AVM Neurosurgery following CTA more clearly showing this as likely source of hemorrhage Dr. SLeonie Mandiscussed with neurosurgery. Plan for angiogram tomorrow and definitive treatment of AVM by Dr. NKathyrn Sheriff  HTN No hx of hypertension or home BP meds prior to admission Unstable  Cleviprex drip off. Now on norvasc '10mg'$ , lisinopril '20mg'$ ,  Long term BP goal normotensive.  Hyperlipidemia Home meds: None  LDL 119, goal < 70 High intensity statin to be considered at discharge in setting of hemorrhagic stroke   Dysphagia Started tube feeding per RD recs Core track placed On IVF, wean off on feeding started and tolerated   Elimination Foley catheter in place, high risk for urinary retention due to AMS Consider bowel regimen when appropriate  Tobacco abuse Current smoker 1PPD, 30 pack year hx Smoking cessation counseling will be provided  Other Stroke  Risk Factors Advanced Age >/= 628 Current ETOH use, alcohol level <10, will be advised to drink no more than 1 drink a day Incidentally noted previous cerebellar stroke   Hypokalemia Potassium repleted. Continue to monitor   [facility]  SRolanda Lundborg MD, PGY-1 04/02/2022 12:19 PM  I have personally obtained history,examined this patient, reviewed notes, independently viewed imaging studies, participated in medical decision making and plan of care.ROS completed by me personally and pertinent positives fully  documented  I have made any additions or clarifications directly to the above note. Agree with note above.  Patient is doing better today and is more alert and interactive but remains aphasic.  Blood pressure elevated quickly controlled.  She is off hypertonic saline with serum sodium is at goal we will discontinue hypertonic saline drip.  Neurosurgery planning to do diagnostic cerebral catheter angiogram tomorrow.  Mobilize out of bed.  Therapy consults.  Discussed with patient and sister at the bedside and answered questions.  Discussed with Dr. Lynetta Mare critical care medicine and neurosurgery practitioner.This patient is critically ill and at significant risk of neurological worsening, death and care requires constant monitoring of vital signs, hemodynamics,respiratory and cardiac monitoring, extensive review of multiple databases, frequent neurological assessment, discussion with family, other specialists and medical decision making of high complexity.I have made any additions or clarifications directly to the above note.This critical care time does not reflect procedure time, or teaching time or supervisory time of PA/NP/Med Resident etc but could involve care discussion time.  I spent 30 minutes of neurocritical care time  in the care of  this patient.      Antony Contras, MD Medical Director [facility] Pager: [phone] 04/02/2022 2:37  PM  To contact Stroke Continuity provider, please refer to [email or web address] After hours, contact General Neurology

## 2022-04-02 NOTE — Progress Notes (Signed)
NAME:  [name] Giles, MRN:  [id number], DOB:  1955/12/02, LOS: 4 ADMISSION DATE:  03/29/2022, CONSULTATION DATE: 03/31/2022 REFERRING MD: Dr. Leonie Man, CHIEF COMPLAINT: Encephalopathy  History of Present Illness:  66 year old woman with history of tobacco use (30 pack years) and COPD/asthma.  She was admitted 8/10 with severe headache, N/V, involving decreased mental status.  Head CT in the [place] ED showed large left frontotemporal ICH with IVH and 5 mm midline shift.  Started on Cleviprex for hypertension, moved to [facility] for further care.  Repeat head CT with increasing hemorrhage and associated hydrocephalus.  Ventriculostomy placed on 8/10.  Subsequent course complicated by persistent hypertension as well as agitated delirium.  Precedex started on 8/11   Pertinent  Medical History   Past Medical History:  Diagnosis Date   Asthma    Medical history non-contributory     Significant Hospital Events: Including procedures, antibiotic start and stop dates in addition to other pertinent events   8/10 presented with encephalopathy, suppressed mental status in the setting of large left frontotemporal ICH plus IVH 8/10 ventriculostomy placed (Dr. Arnoldo Morale) 8/10 MRI brain > 68 cc left temporal ICH, surrounding vasogenic edema, 1 cm left right shift.  Heterogeneous parenchyma along the lateral margin of the hemorrhage, question underlying vascular lesion.  Intraventricular extension, ventriculostomy in place 8/11 CT angio head and neck > unchanged large ICH left temporal lobe with surrounding edema.  Stable mass effect 10 mm.  Unchanged IVH with been trach in place.  Patent internal carotid, vertebral arteries.  Cluster of abnormal tortuous vessels on the lateral aspect of the left temporal lobe compatible with an AVF or AVM 8/11 Precedex for agitated delirium 8/12 repeat head CT > stable IVH.  Stable right shift, estimated 9 mm.  No new findings  Interim History / Subjective:   Improved  agitation. Some sonorous breathing and coughing while trying to clear secretions.  Tolerated decrease in sedation.  Objective   Blood pressure 114/79, pulse 83, temperature 99 F (37.2 C), temperature source Bladder, resp. rate 16, height '5\' 5"'$  (1.651 m), weight 66.2 kg, SpO2 98 %.        Intake/Output Summary (Last 24 hours) at 04/02/2022 0840 Last data filed at 04/02/2022 0800 Gross per 24 hour  Intake 1285.58 ml  Output 1323 ml  Net -37.42 ml    Filed Weights   03/29/22 2000  Weight: 66.2 kg    Examination: General: Ill-appearing woman, no distress HENT: Ventricular drain in place, incision clean and dry Lungs: Distant, clear, no wheeze Cardiovascular: Regular, no murmur Abdomen: Nondistended, positive bowel sounds Extremities: No edema Neuro: Eyes open.  She will track.  Answers some questions and will follow simple commands.  Some perseveration.  No current agitation  Ancillary tests personally reviewed:   Large left sided ICH with intraventricular extension and EVD in place. Na: 153 Assessment & Plan:   Critically ill due to Large left temporal ICH, IVH with hydrocephalus.  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 Patient is now 4 days post bleed. Can stop and allow Na to drift back to normal.  -Ventricular drain management as per neurosurgery -Planning for angiography for her AVF/AVM on 8/14  Hypertension -Goal SBP 130-150 -Cleviprex weaned to off -Continue amlodipine and lisinopril as ordered -Hydralazine and labetalol available if needed  Agitated delirium due to the above.  No significant alcohol use per  the patient's son -Continue low-dose Precedex, stable at 0.4. Minimize as possible to ensure patient alert enough to protect airway. -Decrease neurochecks to q4h to avoid worsening sleep  deprivation. -Benzo available if needed for breakthrough agitation  Hypokalemia Iatrogenic hypernatremia -replace K+ -follow freq Na  COPD/asthma.  Continued tobacco use -Albuterol available as needed -On Symbicort as an outpatient, could consider transition to LABA/LAMA in preparation for discharge -Smoking cessation counseling -Nicotine patch if she needs it  Nutrition -Currently n.p.o. -Consider initiation TF on 8/13  Best Practice (right click and "Reselect all SmartList Selections" daily)   Diet/type: NPO continue tube feeds  DVT prophylaxis: SCD - bleed stable, can start chemical prophylaxis,  GI prophylaxis: not indicated.  Lines: N/A Foley:  Yes, and it is still needed Code Status:  full code Last date of multidisciplinary goals of care discussion [pending] Family: Discussed plans, patient status with the patient's son at bedside on 8/12  CRITICAL CARE Performed by: Kipp Brood   Total critical care time: 40 minutes  Critical care time was exclusive of separately billable procedures and treating other patients.  Critical care was necessary to treat or prevent imminent or life-threatening deterioration.  Critical care was time spent personally by me on the following activities: development of treatment plan with patient and/or surrogate as well as nursing, discussions with consultants, evaluation of patient's response to treatment, examination of patient, obtaining history from patient or surrogate, ordering and performing treatments and interventions, ordering and review of laboratory studies, ordering and review of radiographic studies, pulse oximetry, re-evaluation of patient's condition and participation in multidisciplinary rounds.  Kipp Brood, MD [facility] ICU Physician [place]  Pager: [phone] Mobile: [phone] After hours: [phone].

## 2022-04-02 NOTE — Evaluation (Signed)
Occupational Therapy Evaluation Patient Details Name: [name] Giles MRN: [id number] DOB: 05/20/1956 Today's Date: 04/02/2022   History of Present Illness [name] Giles is a 66 y.o. female presenting after being found on the bathroom at work, head CT revealed a large left frontotemporal ICH with IVH and 5 mm midline shift and ballooning of the temporal horns concerning for obstructive hydrocephalus s/p placement of right frontal ventriculostomy via bur hole 8/10. PHMx: COPD, tobacco abuse and asthma.   Clinical Impression   PTA pt lives independently  with her 11 yo son, who has his 31 yo daughter every other week. At baseline, [name] Giles works full time and helps care for her grand daughter [name] Giles). EVD clamped prior to mobility. Pt inconsistently following commands to sit EOB and transfer to [facility] with mod A +2. Overall max to toal A with ADL tasks due to deficits listed below. Lethargic with poor initiation and attention to task. At this time recommend AIR for rehab. Acute OT to follow. VSS on RA.      Recommendations for follow up therapy are one component of a multi-disciplinary discharge planning process, led by the attending physician.  Recommendations may be updated based on patient status, additional functional criteria and insurance authorization.   Follow Up Recommendations  Acute inpatient rehab (3hours/day)    Assistance Recommended at Discharge Frequent or constant Supervision/Assistance  Patient can return home with the following A lot of help with walking and/or transfers;A lot of help with bathing/dressing/bathroom;Assistance with cooking/housework;Direct supervision/assist for medications management;Direct supervision/assist for financial management;Assist for transportation;Help with stairs or ramp for entrance    Functional Status Assessment  Patient has had a recent decline in their functional status and demonstrates the ability to make significant improvements in function in a  reasonable and predictable amount of time.  Equipment Recommendations  BSC/3in1    Recommendations for Other Services Rehab consult     Precautions / Restrictions Precautions Precautions: Fall Precaution Comments: cortrack, EVD      Mobility Bed Mobility Overal bed mobility: Needs Assistance Bed Mobility: Supine to Sit     Supine to sit: Max assist, +2 for safety/equipment          Transfers Overall transfer level: Needs assistance   Transfers: Sit to/from Stand, Bed to chair/wheelchair/BSC Sit to Stand: Mod assist, +2 safety/equipment     Step pivot transfers: Mod assist, +2 safety/equipment            Balance Overall balance assessment: Needs assistance   Sitting balance-Leahy Scale: Fair       Standing balance-Leahy Scale: Poor                             ADL either performed or assessed with clinical judgement   ADL Overall ADL's : Needs assistance/impaired     Grooming: Maximal assistance   Upper Body Bathing: Total assistance   Lower Body Bathing: Total assistance   Upper Body Dressing : Total assistance   Lower Body Dressing: Total assistance   Toilet Transfer: Moderate assistance;+2 for safety/equipment   Toileting- Clothing Manipulation and Hygiene: Total assistance Toileting - Clothing Manipulation Details (indicate cue type and reason): foley - just removed prior to session     Functional mobility during ADLs: Moderate assistance;+2 for safety/equipment General ADL Comments: poor initiation and attention to any task     Vision Baseline Vision/History: 1 Wears glasses (for reading per sister) Additional Comments: eyes closed majority of session; conjugate  gaze; poor visual attention     Perception Perception Comments: will further assess   Praxis      Pertinent Vitals/Pain Pain Assessment Pain Assessment: Faces Faces Pain Scale: Hurts a little bit Pain Descriptors / Indicators: Discomfort Pain  Intervention(s): Limited activity within patient's tolerance     Hand Dominance Right   Extremity/Trunk Assessment Upper Extremity Assessment Upper Extremity Assessment: Generalized weakness (moving BUE spontaneously)   Lower Extremity Assessment Lower Extremity Assessment: Defer to PT evaluation   Cervical / Trunk Assessment Cervical / Trunk Assessment: Other exceptions   Communication Communication Communication: Expressive difficulties (difficult to understand at times; language not making sense at times)   Cognition Arousal/Alertness: Lethargic Behavior During Therapy: Flat affect, Impulsive, Restless Overall Cognitive Status: Impaired/Different from baseline Area of Impairment: Attention, Orientation, Safety/judgement, Following commands, Awareness, Problem solving                 Orientation Level: Disoriented to, Place, Time, Situation Current Attention Level: Focused   Following Commands: Follows one step commands inconsistently Safety/Judgement: Decreased awareness of safety, Decreased awareness of deficits Awareness: Intellectual Problem Solving: Slow processing, Decreased initiation, Difficulty sequencing General Comments: Will further assess     General Comments       Exercises     Shoulder Instructions      Home Living Family/patient expects to be discharged to:: Private residence Living Arrangements: Children (53 yo son; grandchild every other week) Available Help at Discharge: Family;Available PRN/intermittently Type of Home: House Home Access: Stairs to enter [employer] of Steps: 4 Entrance Stairs-Rails: Right;Left Home Layout: One level     Bathroom Shower/Tub: Tub/shower unit (thresholds/small changes in height throughout the house)   Bathroom Toilet: Standard Bathroom Accessibility: No   Home Equipment: None   Additional Comments: thresholds/small changes in height throughout the house  Lives With: Son    Prior  Functioning/Environment Prior Level of Function : Independent/Modified Independent;Working/employed;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[employer])                        OT Problem List: Decreased strength;Decreased activity tolerance;Impaired balance (sitting and/or standing);Decreased coordination;Decreased cognition;Decreased safety awareness;Decreased knowledge of use of DME or AE      OT Treatment/Interventions: Self-care/ADL training;Therapeutic exercise;Neuromuscular education;DME and/or AE instruction;Therapeutic activities;Cognitive remediation/compensation;Visual/perceptual remediation/compensation;Patient/family education;Balance training    OT Goals(Current goals can be found in the care plan section) Acute Rehab OT Goals Patient Stated Goal: per sister for [name] Giles to get better OT Goal Formulation: With family Time For Goal Achievement: 04/16/22 Potential to Achieve Goals: Good  OT Frequency: Min 2X/week    Co-evaluation PT/OT/SLP Co-Evaluation/Treatment: Yes Reason for Co-Treatment: Complexity of the patient's impairments (multi-system involvement);For patient/therapist safety;To address functional/ADL transfers   OT goals addressed during session: ADL's and self-care      AM-PAC OT "6 Clicks" Daily Activity     Outcome Measure Help from another person eating meals?: Total Help from another person taking care of personal grooming?: A Lot Help from another person toileting, which includes using toliet, bedpan, or urinal?: Total Help from another person bathing (including washing, rinsing, drying)?: Total Help from another person to put on and taking off regular upper body clothing?: Total Help from another person to put on and taking off regular lower body clothing?: Total 6 Click Score: 7   End of Session Equipment Utilized During Treatment: Gait belt Nurse Communication: Mobility status;Other (comment) (need to manage EVD)  Activity Tolerance: Patient  limited by lethargy;Patient limited  by fatigue Patient left: in bed;with call bell/phone within reach;with bed alarm set;with family/visitor present;with SCD's reapplied;with restraints reapplied  OT Visit Diagnosis: Unsteadiness on feet (R26.81);Other abnormalities of gait and mobility (R26.89);Muscle weakness (generalized) (M62.81);Other symptoms and signs involving the nervous system (R29.898);Other symptoms and signs involving cognitive function                Time: 8590-9311 OT Time Calculation (min): 34 min Charges:  OT General Charges $OT Visit: 1 Visit OT Evaluation $OT Eval High Complexity: 1 High  [place], OT/L   Acute OT Clinical Specialist [place] Pager [phone] Office [phone]   [facility] 04/02/2022, 1:55 PM

## 2022-04-02 NOTE — Progress Notes (Signed)
Inpatient Rehab Admissions Coordinator:   Per OT recommendations pt was screened for CIR by Shann Medal, PT, DPT.  Note still with EVD.  Will follow distantly and rescreen for therapy progress once EVD discontinued.   Shann Medal, PT, DPT Admissions Coordinator [phone] 04/02/22  4:30 PM

## 2022-04-02 NOTE — Progress Notes (Signed)
[facility] Progress Note Patient Name: [name] Giles DOB: 17-Oct-1955 MRN: [id number]   Date of Service  04/02/2022  HPI/Events of Note  Patient needs restraints renewed to prevent interruption of medical care.  eICU Interventions  Restraints renewed.        Frederik Pear 04/02/2022, 9:33 PM

## 2022-04-02 NOTE — Evaluation (Signed)
Clinical/Bedside Swallow Evaluation Patient Details  Name: [name] Giles MRN: [id number] Date of Birth: 1956/01/15  Today's Date: 04/02/2022 Time: SLP Start Time (ACUTE ONLY): 0930 SLP Stop Time (ACUTE ONLY): 0943 SLP Time Calculation (min) (ACUTE ONLY): 13 min  Past Medical History:  Past Medical History:  Diagnosis Date   Asthma    Medical history non-contributory    Past Surgical History:  Past Surgical History:  Procedure Laterality Date   COLONOSCOPY     COLONOSCOPY N/A 04/06/2013   Procedure: COLONOSCOPY;  Surgeon: Daneil Dolin, MD;  [facility];  Service: Endoscopy;  Laterality: N/A;  9:30 AM   ENDOMETRIAL ABLATION     HPI:  Pt is a 67 y/o female who presented with headache, N/V, and AMS. 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 Repeat head CT with increasing hemorrhage and associated hydrocephalus. Pt s/p ventriculostomy 8/10. PMH: COPD, tobacco abuse, asthma.    Assessment / Plan / Recommendation  Clinical Impression  Pt was seen for bedside swallow evaluation. Pt was lethargic throughout the evaluation and this likely impacted her performance. Oral mechanism exam was limited due to pt's difficulty following commands. Oral mucosa was dry with dried mucous/secretions on the hard palate. She presented with adequate, natural dentition. Pt exhibited reduced bolus awareness with oral holding of purees and consistent blowing when liquids were presented. Puree was ultimately removed from the oral cavity via suction due to absent bolus manipulation. Pt tolerated ice chips without overt s/s of aspiration or need for prompts to initiate bolus manipulation or swallowing. Pt's NPO status will be maintained, but with allowance of ice chips after oral care when pt is adequately alert. SLP will follow pt to assess improvement in swallow function. SLP Visit Diagnosis: Dysphagia, unspecified (R13.10)    Aspiration Risk  Moderate aspiration risk     Diet Recommendation Ice chips PRN after oral care;NPO;Alternative means - temporary   Supervision: Full supervision/cueing for compensatory strategies    Other  Recommendations Oral Care Recommendations: Oral care BID    Recommendations for follow up therapy are one component of a multi-disciplinary discharge planning process, led by the attending physician.  Recommendations may be updated based on patient status, additional functional criteria and insurance authorization.  Follow up Recommendations  (Continued SLP services at level of care recommended by PT/OT)      Assistance Recommended at Discharge Frequent or constant Supervision/Assistance  Functional Status Assessment Patient has had a recent decline in their functional status and demonstrates the ability to make significant improvements in function in a reasonable and predictable amount of time.  Frequency and Duration min 2x/week  2 weeks       Prognosis Prognosis for Safe Diet Advancement: Good Barriers to Reach Goals: Language deficits;Cognitive deficits      Swallow Study   General Date of Onset: 04/01/22 HPI: Pt is a 66y/o female who presented with headache, N/V, and AMS. 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 Repeat head CT with increasing hemorrhage and associated hydrocephalus. Pt s/p ventriculostomy 8/10. PMH: COPD, tobacco abuse, asthma. Type of Study: Bedside Swallow Evaluation Previous Swallow Assessment: none Diet Prior to this Study: NPO;NG Tube Temperature Spikes Noted: No Respiratory Status: Nasal cannula History of Recent Intubation: No Behavior/Cognition: Cooperative;Pleasant mood;Lethargic/Drowsy Oral Cavity Assessment: Within Functional Limits Oral Care Completed by SLP: No Oral Cavity - Dentition: Adequate natural dentition Self-Feeding Abilities: Needs assist Patient Positioning: Upright in bed;Postural control  adequate for testing Baseline Vocal Quality:  Low vocal intensity;Hoarse Volitional Cough: Weak Volitional Swallow: Unable to elicit    Oral/Motor/Sensory Function Overall Oral Motor/Sensory Function:  (difficult to assess)   Ice Chips Ice chips: Within functional limits Presentation: Spoon   Thin Liquid Thin Liquid: Impaired Presentation: Cup;Straw Oral Phase Impairments: Poor awareness of bolus    Nectar Thick Nectar Thick Liquid: Not tested   Honey Thick Honey Thick Liquid: Not tested   Puree Puree: Impaired Presentation: Spoon Oral Phase Impairments: Poor awareness of bolus Oral Phase Functional Implications: Oral holding   Solid     Solid: Not tested     Latanga Nedrow I. Hardin Negus, [place], [place] Office number [phone]  Horton Marshall 04/02/2022,10:16 AM

## 2022-04-02 NOTE — Evaluation (Signed)
Speech Language Pathology Evaluation Patient Details Name: [name] Giles MRN: [id number] DOB: 1956/03/04 Today's Date: 04/02/2022 Time: 0944-1000 SLP Time Calculation (min) (ACUTE ONLY): 16 min  Problem List:  Patient Active Problem List   Diagnosis Date Noted   Intracerebral hemorrhage ([place]) 03/29/2022   Past Medical History:  Past Medical History:  Diagnosis Date   Asthma    Medical history non-contributory    Past Surgical History:  Past Surgical History:  Procedure Laterality Date   COLONOSCOPY     COLONOSCOPY N/A 04/06/2013   Procedure: COLONOSCOPY;  Surgeon: Daneil Dolin, MD;  [facility];  Service: Endoscopy;  Laterality: N/A;  9:30 AM   ENDOMETRIAL ABLATION     HPI:  Pt is a 66 y/o female who presented with headache, N/V, and AMS. 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 Repeat head CT with increasing hemorrhage and associated hydrocephalus. Pt s/p ventriculostomy 8/10. PMH: COPD, tobacco abuse, asthma.   Assessment / Plan / Recommendation Clinical Impression  Pt was seen for speech-language-cognition evaluation which was limited due to pt's progressing lethargy. Pt presented with impairments in auditory comprehension and attention. She was able to follow some one-step commands and answer some simple and complex yes/no question, but exhibited more difficulty as complexity increased. She participated in very simple, brief conversation. The limited sentences that were produced were syntactically accurate, but not always meaningful for the conversational topic. She demonstrated difficulty attending/maintaining alertness for long enough periods for thorough assessment of verbal expression. Further assessment of pt's language and cognitive skills is necessary and intervention will be initiated thereafter.    SLP Assessment  SLP Recommendation/Assessment: Patient needs continued Speech [facility]Pathology Services SLP Visit  Diagnosis: Aphasia (R47.01)    Recommendations for follow up therapy are one component of a multi-disciplinary discharge planning process, led by the attending physician.  Recommendations may be updated based on patient status, additional functional criteria and insurance authorization.    Follow Up Recommendations   (Continued SLP services at level of care recommended by PT/OT)    Assistance Recommended at Discharge  Frequent or constant Supervision/Assistance  Functional Status Assessment Patient has had a recent decline in their functional status and demonstrates the ability to make significant improvements in function in a reasonable and predictable amount of time.  Frequency and Duration min 2x/week  2 weeks      SLP Evaluation Cognition  Overall Cognitive Status: Difficult to assess Arousal/Alertness: Lethargic Orientation Level: Oriented to person Attention: Focused Focused Attention: Impaired Focused Attention Impairment: Verbal basic       Comprehension  Auditory Comprehension Overall Auditory Comprehension: Impaired Yes/No Questions: Impaired Basic Biographical Questions:  (2/5) Complex Questions:  (3/5) Commands: Impaired One Step Basic Commands:  (3/5) Two Step Basic Commands:  (0/3) Conversation: Simple    Expression Expression Primary Mode of Expression: Verbal Verbal Expression Overall Verbal Expression: Impaired Initiation: Impaired Level of Generative/Spontaneous Verbalization: Phrase Naming: Impairment Verbal Errors: Perseveration   Oral / Motor  Oral Motor/Sensory Function Overall Oral Motor/Sensory Function:  (difficult to assess) Motor Speech Overall Motor Speech: Appears within functional limits for tasks assessed Respiration: Within functional limits           Kehaulani Fruin I. PHardin Negus MS[place] CBratenahlOffice number [phone] SHorton Marshall8/14/2023, 10:31 AM

## 2022-04-02 NOTE — Progress Notes (Signed)
Initial Nutrition Assessment  DOCUMENTATION CODES:   Not applicable  INTERVENTION:   Tube feeding via Cortrak tube: Omsolite 1.5 at 50 ml/h (1200 ml per day) Prosource TF20 60 ml daily  Provides 1880 kcal, 95 gm protein, 912 ml free water daily   NUTRITION DIAGNOSIS:   Inadequate oral intake related to inability to eat as evidenced by NPO status. Ongoing.   GOAL:   Patient will meet greater than or equal to 90% of their needs Met with TF at goal   MONITOR:   TF tolerance  REASON FOR ASSESSMENT:    (cortrak tube)    ASSESSMENT:   Pt with PMH of COPD, current every day smoker admitted with ICH.   Pt discussed during ICU rounds and with RN and MD.  EVD remains in place, angio tomorrow.  SLP following, continue NPO for now but is allowed ice chips.   8/11 cortrak placed; tip gastric  Sister at bedside. Pt is awake but confused.  Medications reviewed and include: 40 mEq KCl every 4 hours x 3, senokot-s  Precedex   Labs reviewed: Na 153 - hypertonic saline off with plans to allow Na to drift down, K 3.3  EVD: 151 ml   Diet Order:   Diet Order     None       EDUCATION NEEDS:   No education needs have been identified at this time  Skin:  Skin Assessment: Reviewed RN Assessment  Last BM:  unknown  Height:   Ht Readings from Last 1 Encounters:  03/29/22 5' 5"  (1.651 m)    Weight:   Wt Readings from Last 1 Encounters:  03/29/22 66.2 kg    BMI:  Body mass index is 24.29 kg/m.  Estimated Nutritional Needs:   Kcal:  1800-2000  Protein:  90-100 grams  Fluid:  >1.8 L/day  Lockie Pares., RD, LDN, CNSC See AMiON for contact information

## 2022-04-02 NOTE — Progress Notes (Signed)
  NEUROSURGERY PROGRESS NOTE   Pt seen and examined. No issues overnight.   EXAM: Temp:  [97.7 F (36.5 C)-100.2 F (37.9 C)] 99.5 F (37.5 C) (08/14 0900) Pulse Rate:  [65-105] 70 (08/14 0900) Resp:  [16-26] 18 (08/14 0900) BP: (102-165)/(51-110) 123/59 (08/14 0900) SpO2:  [95 %-98 %] 96 % (08/14 0900) Intake/Output      08/13 0701 08/14 0700 08/14 0701 08/15 0700   I.V. (mL/kg) 204.9 (3.1) 1.4 (0)   NG/GT 985.8 50   IV Piggyback 50.1    Total Intake(mL/kg) 1240.8 (18.7) 51.4 (0.8)   Urine (mL/kg/hr) 1150 (0.7) 15 (0.1)   Drains 151 25   Total Output 1301 40   Net -60.2 +11.4         Awake, alert Follows simple commands, can say name Mixed aphasia with impaired naming, repetition. MAE good strength EVD in place, blood tinged CSF remains patent  LABS: Lab Results  Component Value Date   CREATININE 0.43 (L) 04/02/2022   BUN 26 (H) 04/02/2022   NA 153 (H) 04/02/2022   K 3.3 (L) 04/02/2022   CL 121 (H) 04/02/2022   CO2 27 04/02/2022   Lab Results  Component Value Date   WBC 9.8 04/02/2022   HGB 12.5 04/02/2022   HCT 38.1 04/02/2022   MCV 91.6 04/02/2022   PLT 177 04/02/2022    IMPRESSION: - 66 y.o. 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 Remains neurologically stable with mixed aphasia.  PLAN: - Cont EVD open to drain for now. - Plan on catheter angiogram likely tomorrow - Cont SBP goal < 117mHg   [name] MD CCol[facility]

## 2022-04-03 DIAGNOSIS — I611 Nontraumatic intracerebral hemorrhage in hemisphere, cortical: Secondary | ICD-10-CM | POA: Diagnosis not present

## 2022-04-03 LAB — BASIC METABOLIC PANEL
Anion gap: 5 (ref 5–15)
BUN: 28 mg/dL — ABNORMAL HIGH (ref 8–23)
CO2: 27 mmol/L (ref 22–32)
Calcium: 9 mg/dL (ref 8.9–10.3)
Chloride: 119 mmol/L — ABNORMAL HIGH (ref 98–111)
Creatinine, Ser: 0.54 mg/dL (ref 0.44–1.00)
GFR, Estimated: >60 mL/min (ref >60)
Glucose, Bld: 115 mg/dL — ABNORMAL HIGH (ref 70–99)
Potassium: 3.6 mmol/L (ref 3.5–5.1)
Sodium: 151 mmol/L — ABNORMAL HIGH (ref 135–145)

## 2022-04-03 LAB — CBC
HCT: 40 % (ref 36.0–46.0)
Hemoglobin: 12.9 g/dL (ref 12.0–15.0)
MCH: 29.7 pg (ref 26.0–34.0)
MCHC: 32.3 g/dL (ref 30.0–36.0)
MCV: 92.2 fL (ref 80.0–100.0)
Platelets: 181 10*3/uL (ref 150–400)
RBC: 4.34 MIL/uL (ref 3.87–5.11)
RDW: 14.1 % (ref 11.5–15.5)
WBC: 11.1 10*3/uL — ABNORMAL HIGH (ref 4.0–10.5)
nRBC: 0 % (ref 0.0–0.2)

## 2022-04-03 LAB — GLUCOSE, CAPILLARY: Glucose-Capillary: 130 mg/dL — ABNORMAL HIGH (ref 70–99)

## 2022-04-03 MED ORDER — BUDESONIDE 0.25 MG/2ML IN SUSP
0.2500 mg | Freq: Two times a day (BID) | RESPIRATORY_TRACT | Status: DC
  Administered 2022-04-03 – 2022-04-16 (×27): 0.25 mg via RESPIRATORY_TRACT
  Filled 2022-04-03 (×27): qty 2

## 2022-04-03 MED ORDER — POTASSIUM CHLORIDE 20 MEQ PO PACK
40.0000 meq | PACK | Freq: Once | ORAL | Status: AC
Start: 2022-04-03 — End: 2022-04-03
  Administered 2022-04-03: 40 meq
  Filled 2022-04-03: qty 2

## 2022-04-03 MED ORDER — HALOPERIDOL LACTATE 5 MG/ML IJ SOLN
1.0000 mg | INTRAMUSCULAR | Status: DC | PRN
  Administered 2022-04-09: 1 mg via INTRAVENOUS
  Filled 2022-04-03 (×2): qty 1

## 2022-04-03 MED ORDER — REVEFENACIN 175 MCG/3ML IN SOLN
175.0000 ug | Freq: Every day | RESPIRATORY_TRACT | Status: DC
  Administered 2022-04-03 – 2022-04-16 (×14): 175 ug via RESPIRATORY_TRACT
  Filled 2022-04-03 (×15): qty 3

## 2022-04-03 MED ORDER — ARFORMOTEROL TARTRATE 15 MCG/2ML IN NEBU
15.0000 ug | INHALATION_SOLUTION | Freq: Two times a day (BID) | RESPIRATORY_TRACT | Status: DC
  Administered 2022-04-03 – 2022-04-16 (×27): 15 ug via RESPIRATORY_TRACT
  Filled 2022-04-03 (×27): qty 2

## 2022-04-03 MED ORDER — MELATONIN 3 MG PO TABS
3.0000 mg | ORAL_TABLET | Freq: Every day | ORAL | Status: DC
Start: 2022-04-03 — End: 2022-04-16
  Administered 2022-04-04 – 2022-04-15 (×11): 3 mg via ORAL
  Filled 2022-04-03 (×12): qty 1

## 2022-04-03 MED ORDER — QUETIAPINE FUMARATE 25 MG PO TABS
25.0000 mg | ORAL_TABLET | Freq: Every evening | ORAL | Status: DC
Start: 2022-04-03 — End: 2022-04-07
  Administered 2022-04-04 – 2022-04-06 (×3): 25 mg
  Filled 2022-04-03 (×3): qty 1

## 2022-04-03 NOTE — Progress Notes (Signed)
   Providing Compassionate, Quality Care - Together   Subjective: Nurse reports patient was agitated overnight in spite of Seroquel and two dose of Ativan. Precedex was restarted, but stopped around 3 AM.  Objective: Vital signs in last 24 hours: Temp:  [98.4 F (36.9 C)-100.5 F (38.1 C)] 98.7 F (37.1 C) (08/15 2000) Pulse Rate:  [96-137] 101 (08/15 2100) Resp:  [15-27] 21 (08/15 2100) BP: (95-158)/(55-131) 126/111 (08/15 2100) SpO2:  [94 %-100 %] 97 % (08/15 2100)  Intake/Output from previous day: 08/14 0701 - 08/15 0700 In: 883.4 [I.V.:33.4; NG/GT:850] Out: 507 [Urine:325; Drains:182] Intake/Output this shift: Total I/O In: -  Out: 44 [Drains:44]  Per report: Somnolent MAE, strength and sensation intact EVD in place, 95 mL overnight Ventriculostomy site is clean, dry, and intact  Lab Results: Recent Labs    04/02/22 0602 04/03/22 0554  WBC 9.8 11.1*  HGB 12.5 12.9  HCT 38.1 40.0  PLT 177 181   BMET Recent Labs    04/02/22 0602 04/02/22 1200 04/03/22 0554  NA 153* 153* 151*  K 3.3*  --  3.6  CL 121*  --  119*  CO2 27  --  27  GLUCOSE 141*  --  115*  BUN 26*  --  28*  CREATININE 0.43*  --  0.54  CALCIUM 8.7*  --  9.0    Studies/Results: No results found.  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 Plan is to go to IR for cathter angiogram with Dr. Kathyrn Sheriff. Case is being rescheduled from today due to scheduling constraints. Dr. Kathyrn Sheriff will update when new time is set. Continue EVD to open drain for now.      LOS: 5 days     Viona Gilmore, DNP, AGNP-C Nurse Practitioner  [facility] [place] [address], [place], [place] [address] P: [phone]    F: [phone]  04/03/2022, 11:55 AM

## 2022-04-03 NOTE — Progress Notes (Signed)
Physical Therapy Treatment Patient Details Name: [name] Giles MRN: [id number] DOB: 18-Apr-1956 Today's Date: 04/03/2022   History of Present Illness [name] Giles is a 66 y.o. 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 PHMx: COPD, tobacco abuse and asthma.    PT Comments    Patient not progressing towards PT goals today secondary to decreased level of arousal. Pt seen in conjunction with SLP due to lethargy, safety, decreased ability to tolerate activity and restlessness. Pt given ativan last night likely causing this presentation- thrashing and yelling in bed this morning, per RN. Eyes closed for most of session but able to open them a few times to commands or with change in position. Follows a few commands ~10% of the time for ADl tasks sitting EOB with hand over hand assist. Requires Max-Mod A for sitting balance varying depending on fatigue and wakefulness. Speech difficult to understand due to mumbling. Will continue to follow and progress as pt's alertness improves.    Recommendations for follow up therapy are one component of a multi-disciplinary discharge planning process, led by the attending physician.  Recommendations may be updated based on patient status, additional functional criteria and insurance authorization.  Follow Up Recommendations  Acute inpatient rehab (3hours/day)     Assistance Recommended at Discharge Frequent or constant Supervision/Assistance  Patient can return home with the following Two people to help with walking and/or transfers;A lot of help with bathing/dressing/bathroom;Assistance with feeding;Assistance with cooking/housework;Direct supervision/assist for medications management;Direct supervision/assist for  financial management;Assist for transportation;Help with stairs or ramp for entrance   Equipment Recommendations  Other (comment) (TBD)    Recommendations for Other Services       Precautions / Restrictions Precautions Precautions: Fall Precaution Comments: cortrack, EVD, posey belt, bil mitts Restrictions Weight Bearing Restrictions: No     Mobility  Bed Mobility Overal bed mobility: Needs Assistance Bed Mobility: Supine to Sit, Sit to Supine     Supine to sit: Max assist, +2 for physical assistance Sit to supine: +2 for physical assistance, Total assist   General bed mobility comments: Able to initiate LEs minimally, otherwise needed assist for LEs to get to EOB and for trunk elevation. Total A to return to supine.    Transfers                   General transfer comment: Deferred due to decreased level of arousal and safety.    Ambulation/Gait                   Stairs             Wheelchair Mobility    Modified Rankin (Stroke Patients Only) Modified Rankin (Stroke Patients Only) Pre-Morbid Rankin Score: No symptoms Modified Rankin: Moderately severe disability     Balance Overall balance assessment: Needs assistance Sitting-balance support: Feet supported, No upper extremity supported Sitting balance-Leahy Scale: Poor Sitting balance - Comments: Requires Mod A for sitting balance, poor righting reactions noted in all directions. Moments of Min A but mostly Mod-max A for sitting balance. participating in some ADl tasks sitting EOB. Postural control: Posterior lean  Cognition Arousal/Alertness: Lethargic Behavior During Therapy: Restless, Flat affect Overall Cognitive Status: Difficult to assess                                 General Comments: Pt with mumbling like speech, follows a few simple commands with repetition and increased time. SLeepy with eyes closed for most of  session, able to open them a few times to command and spontaneously.        Exercises      General Comments General comments (skin integrity, edema, etc.): Son present during session. VSS on 02.      Pertinent Vitals/Pain Pain Assessment Pain Assessment: Faces Faces Pain Scale: Hurts a little bit Pain Location: generalized with movement Pain Descriptors / Indicators: Discomfort, Grimacing Pain Intervention(s): Monitored during session, Repositioned    Home Living                          Prior Function            PT Goals (current goals can now be found in the care plan section) Progress towards PT goals: Not progressing toward goals - comment (due to poor arousal)    Frequency    Min 4X/week      PT Plan Current plan remains appropriate    Co-evaluation PT/OT/SLP Co-Evaluation/Treatment: Yes Reason for Co-Treatment: Necessary to address cognition/behavior during functional activity;Complexity of the patient's impairments (multi-system involvement);For patient/therapist safety;To address functional/ADL transfers PT goals addressed during session: Mobility/safety with mobility;Balance        AM-PAC PT "6 Clicks" Mobility   Outcome Measure  Help needed turning from your back to your side while in a flat bed without using bedrails?: A Lot Help needed moving from lying on your back to sitting on the side of a flat bed without using bedrails?: Total Help needed moving to and from a bed to a chair (including a wheelchair)?: Total Help needed standing up from a chair using your arms (e.g., wheelchair or bedside chair)?: Total Help needed to walk in hospital room?: Total Help needed climbing 3-5 steps with a railing? : Total 6 Click Score: 7    End of Session   Activity Tolerance: Patient limited by lethargy Patient left: in bed;with call bell/phone within reach;with bed alarm set;with SCD's reapplied;with restraints reapplied;with nursing/sitter in  room Nurse Communication: Mobility status PT Visit Diagnosis: Unsteadiness on feet (R26.81);Other abnormalities of gait and mobility (R26.89);Repeated falls (R29.6);Muscle weakness (generalized) (M62.81)     Time: 6599-3570 PT Time Calculation (min) (ACUTE ONLY): 16 min  Charges:  $Therapeutic Activity: 8-22 mins                     Marisa Severin, PT, DPT Acute Rehabilitation Services Secure chat preferred Office [place] 04/03/2022, 11:21 AM

## 2022-04-03 NOTE — Plan of Care (Signed)
  Problem: Clinical Measurements: Goal: Ability to maintain clinical measurements within normal limits will improve Outcome: Progressing Goal: Will remain free from infection Outcome: Progressing Goal: Diagnostic test results will improve Outcome: Progressing Goal: Cardiovascular complication will be avoided Outcome: Progressing   Problem: Activity: Goal: Risk for activity intolerance will decrease Outcome: Progressing   Problem: Elimination: Goal: Will not experience complications related to bowel motility Outcome: Progressing Goal: Will not experience complications related to urinary retention Outcome: Progressing   Problem: Safety: Goal: Ability to remain free from injury will improve Outcome: Progressing   Problem: Skin Integrity: Goal: Risk for impaired skin integrity will decrease Outcome: Progressing   Problem: Self-Care: Goal: Ability to participate in self-care as condition permits will improve Outcome: Progressing Goal: Ability to communicate needs accurately will improve Outcome: Progressing   Problem: Nutrition: Goal: Risk of aspiration will decrease Outcome: Progressing   Problem: Intracerebral Hemorrhage Tissue Perfusion: Goal: Complications of Intracerebral Hemorrhage will be minimized Outcome: Progressing

## 2022-04-03 NOTE — Progress Notes (Signed)
[facility] Progress Note Patient Name: MYLO DRISKILL DOB: Apr 07, 1956 MRN: [id number]   Date of Service  04/03/2022  HPI/Events of Note  Patient needs restraints renewed to prevent interruption of safe medical care.  eICU Interventions  Restraints renewed.        Kerry Kass Emanuel Campos 04/03/2022, 9:28 PM

## 2022-04-03 NOTE — Progress Notes (Signed)
NAME:  [name] Giles, MRN:  [id number], DOB:  03/28/1956, LOS: 5 ADMISSION DATE:  03/29/2022, CONSULTATION DATE: 03/31/2022 REFERRING MD: Dr. Leonie Man, CHIEF COMPLAINT: Encephalopathy  History of Present Illness:  66 year old woman with history of tobacco use (30 pack years) and COPD/asthma.  She was admitted 8/10 with severe headache, N/V, involving decreased mental status.  Head CT in the [place] ED showed large left frontotemporal ICH with IVH and 5 mm midline shift.  Started on Cleviprex for hypertension, moved to [facility] for further care.  Repeat head CT with increasing hemorrhage and associated hydrocephalus.  Ventriculostomy placed on 8/10.  Subsequent course complicated by persistent hypertension as well as agitated delirium.  Precedex started on 8/11   Pertinent  Medical History   Past Medical History:  Diagnosis Date   Asthma    Medical history non-contributory     Significant Hospital Events: Including procedures, antibiotic start and stop dates in addition to other pertinent events   8/10 presented with encephalopathy, suppressed mental status in the setting of large left frontotemporal ICH plus IVH 8/10 ventriculostomy placed (Dr. Arnoldo Morale) 8/10 MRI brain > 68 cc left temporal ICH, surrounding vasogenic edema, 1 cm left right shift.  Heterogeneous parenchyma along the lateral margin of the hemorrhage, question underlying vascular lesion.  Intraventricular extension, ventriculostomy in place 8/11 CT angio head and neck > unchanged large ICH left temporal lobe with surrounding edema.  Stable mass effect 10 mm.  Unchanged IVH with been trach in place.  Patent internal carotid, vertebral arteries.  Cluster of abnormal tortuous vessels on the lateral aspect of the left temporal lobe compatible with an AVF or AVM 8/11 Precedex for agitated delirium 8/12 repeat head CT > stable IVH.  Stable right shift, estimated 9 mm.  No new findings  Interim History / Subjective:   Improved  agitation. Some sonorous breathing and coughing while trying to clear secretions - has baseline cough per son. Required Precedex overnight.   Objective   Blood pressure (!) 148/126, pulse (!) 115, temperature 98.4 F (36.9 C), temperature source Axillary, resp. rate 18, height '5\' 5"'$  (1.651 m), weight 66.2 kg, SpO2 100 %.        Intake/Output Summary (Last 24 hours) at 04/03/2022 0847 Last data filed at 04/03/2022 0700 Gross per 24 hour  Intake 832.02 ml  Output 475 ml  Net 357.02 ml    Filed Weights   03/29/22 2000  Weight: 66.2 kg    Examination: General: Ill-appearing woman, no distress HENT: Ventricular drain in place, incision clean and dry Lungs: Distant, clear, no wheeze Cardiovascular: Regular, no murmur Abdomen: Nondistended, positive bowel sounds Extremities: No edema Neuro: Eyes open.  She will track.  Answers some questions and will follow simple commands.  Some perseveration.  No current agitation  Ancillary tests personally reviewed:   Large left sided ICH with intraventricular extension and EVD in place. Na: 151 Assessment & Plan:   Critically ill due to Large left temporal ICH, IVH with hydrocephalus.  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 Patient is now 4 days post bleed. Can stop and allow Na to drift back to normal.  -Ventricular drain management as per neurosurgery -Planning for angiography for her AVF/AVM on 8/14  Hypertension -Goal SBP 130-150 -Cleviprex weaned to off -Continue amlodipine and lisinopril as ordered -Hydralazine and labetalol available if needed  Agitated delirium due to the  above.  No significant alcohol use per the patient's son -Continue low-dose Precedex, stable at 0.4. Minimize as possible to ensure patient alert enough to protect airway. -Decrease neurochecks to  q4h to avoid worsening sleep deprivation. -Benzo available if needed for breakthrough agitation  Hypokalemia Iatrogenic hypernatremia -replace K+ -follow freq Na  COPD/asthma.  Continued tobacco use -Albuterol available as needed -On Symbicort as an outpatient, could consider transition to LABA/LAMA in preparation for discharge -Smoking cessation counseling -Nicotine patch if she needs it  Nutrition -Currently n.p.o. -Consider initiation TF on 8/13  Best Practice (right click and "Reselect all SmartList Selections" daily)   Diet/type: NPO continue tube feeds  DVT prophylaxis: SCD - bleed stable, can start chemical prophylaxis,  GI prophylaxis: not indicated.  Lines: N/A Foley:  Yes, and it is still needed Code Status:  full code Last date of multidisciplinary goals of care discussion [pending] Family: Discussed plans, patient status with the patient's son at bedside on 8/12  Kipp Brood, MD [facility] ICU Physician [place]  Pager: [phone] Mobile: [phone] After hours: [phone].

## 2022-04-03 NOTE — Progress Notes (Addendum)
Speech Language Pathology Treatment: Cognitive-Linquistic  Patient Details Name: [name] Giles MRN: [id number] DOB: November 15, 1955 Today's Date: 04/03/2022 Time: [id number] SLP Time Calculation (min) (ACUTE ONLY): 19 min  Assessment / Plan / Recommendation Clinical Impression  Pt seen in conjunction with PT and son, [name] Giles, at bedside. Educated son re: goals of ST and pt's current cognitive-speech-communication. Pt is lethargic this morning after having Ativan last night. RN stated she was yelling and thrashing in bed this morning. Pt sleeping when therapist arrived, repositioned to edge of bed with assist and required hand over hand assist for basic ADL's of brushing teeth, wiping face. Followed commands 10% of the time opening her eyes spontaneously versus to command. Her speech was significantly dysarthric this am although able to make several needs known "want to go home" and majority of utterances were not intelligible (difficult to dechipher language deficits vs dysarthria). PO's were not given this session due to decreased level of alertness and suspected poor ability to manage and control liquid/ice/puree. ST will continue to see pt in the acute care setting for attention, initiation, speech and language.    HPI HPI: Pt is a 66 y/o female who presented with headache, N/V, and AMS. 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 Repeat head CT with increasing hemorrhage and associated hydrocephalus. Pt s/p ventriculostomy 8/10. Cortrak placed 8/11. PMH: COPD, tobacco abuse, asthma.      SLP Plan  Continue with current plan of care      Recommendations for follow up therapy are one component of a multi-disciplinary discharge planning process, led by the attending physician.  Recommendations may be updated based on patient status, additional functional criteria and insurance authorization.    Recommendations  Diet recommendations: NPO Medication Administration: Via  alternative means                General recommendations: Rehab consult Oral Care Recommendations: Oral care BID Follow Up Recommendations: Acute inpatient rehab (3hours/day) Assistance recommended at discharge: Frequent or constant Supervision/Assistance SLP Visit Diagnosis: Aphasia (R47.01);Cognitive communication deficit ((S17.793 Plan: Continue with current plan of care           LHouston Giles 04/03/2022, 9:58 AM

## 2022-04-03 NOTE — TOC CAGE-AID Note (Signed)
Transition of Care [facility]) - CAGE-AID Screening   Patient Details  Name: [name] Giles MRN: [id number] Date of Birth: 31-May-1956  Transition [facility] CM/SW Contact:    Coralee Pesa, [place] Phone Number: 04/03/2022, 11:44 AM   Clinical Narrative:  Per chart review, pt is not appropriate for CAGE- AID assessment at this time.  CAGE-AID Screening: Substance Abuse Screening unable to be completed due to: : Patient unable to participate             Substance Abuse Education Offered: No

## 2022-04-03 NOTE — Progress Notes (Addendum)
Hello Krishna mannose 2.  STROKE TEAM PROGRESS NOTE   INTERVAL HISTORY Son at the bedside. EVD with 35cc output over the last 24 hrs with EVD open to drain. Na 151 this AM, now off 3% drip for 72 hours. T 100.5 this AM. Received scheduled seroquel last night but had increased agitation so was given ativan x2 and started on low dose precedex. On heparin SQ.   Off sedation on [place]. Increased somnolence today. Gaze able to move horizontally, limited vertical gaze. Moves all 4 extremities well without focal weakness.  Blood pressure adequately controlled head normal. Discussed agitation management with bedside RN and pharmacy. Discontinued ativan. Plan to administer seroquel earlier to minimize use of precedex, benzodiazepines at night.   Vitals:   04/03/22 0900 04/03/22 0955 04/03/22 1000 04/03/22 1100  BP: (!) 132/56 119/69 (!) 132/59 (!) 108/97  Pulse: (!) 118  (!) 110 (!) 107  Resp: 19  (!) 23 18  Temp:      TempSrc:      SpO2: 96%  95% 96%  Weight:      Height:       CBC:  Recent Labs  Lab 03/29/22 1533 03/31/22 0608 04/02/22 0602 04/03/22 0554  WBC 9.3   < > 9.8 11.1*  NEUTROABS 5.5  --   --   --   HGB 14.5   < > 12.5 12.9  HCT 44.8   < > 38.1 40.0  MCV 91.6   < > 91.6 92.2  PLT 207   < > 177 181   < > = values in this interval not displayed.   Basic Metabolic Panel:  Recent Labs  Lab 04/01/22 0616 04/01/22 1158 04/02/22 0602 04/02/22 1200 04/03/22 0554  NA 155*   < > 153* 153* 151*  K 2.9*  --  3.3*  --  3.6  CL 123*  --  121*  --  119*  CO2 24  --  27  --  27  GLUCOSE 108*  --  141*  --  115*  BUN 20  --  26*  --  28*  CREATININE 0.74  --  0.43*  --  0.54  CALCIUM 9.4  --  8.7*  --  9.0  MG 1.8  --  2.1  --   --   PHOS  --   --  3.4  --   --    < > = values in this interval not displayed.   Lipid Panel:  Recent Labs  Lab 03/29/22 2040  CHOL 190  TRIG 28  HDL 65  CHOLHDL 2.9  VLDL 6  LDLCALC 119*   HgbA1c:  Recent Labs  Lab 03/29/22 2040  HGBA1C  5.7*   Urine Drug Screen:  Recent Labs  Lab 03/29/22 1654  LABOPIA NONE DETECTED  COCAINSCRNUR NONE DETECTED  LABBENZ NONE DETECTED  AMPHETMU NONE DETECTED  THCU NONE DETECTED  LABBARB NONE DETECTED    Alcohol Level  Recent Labs  Lab 03/29/22 1533  ETH <10    IMAGING past 24 hours No results found.  PHYSICAL EXAM 66 yo female lying in bed with EVD in place. Precedex off this AM. She appears somnolent. She is spontaneously moving all extremities with left mitten restraint in place.  Not following simple commands. Not oriented to situation, time. Exam is limited due to patient's somnolence. Nonfluent speech with mixed receptive and expressive aphasia.  Gaze preference to left but able to cross midline to right. Limited vertical gaze. Cranial nerves:  PERRL. Motor examination limited today given patient somnolence. Moves all 4 extremities without focal weakness.    ASSESSMENT/PLAN 66 year old female with history of COPD, smoker admitted for headache, nausea, vomiting, found down in bathroom with decreased level consciousness.  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  CT repeat showed mild increase in size. NSG consulted. Dr. Arnoldo Morale on board. Status post EVD. MRI with and without contrast showed 68 cc ICH, with IVH and midline shift 16m.  Repeat CT showed stable hematoma but midline shift up to 1 cm.  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  CTV no this thrombosis.  EF 60 to 65%, LDL 119, A1c 5.7, UDS negative.  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  Impending left uncal herniation.  Midline shift 5 mm Repeat HCT 2 hours after Interval increase in size of Left IPH. New IPH within the left uncus/medial temporal lobe.  MRI  03/29/22 - 68 mL intraparenchymal hemorrhage centered at the left  temporal lobe, similar in size as compared to prior CT. midline shift 7 mm CT repeat 03/30/22 stable hematoma but increased midline shift to 1 cm CTA Head and Neck Cluster of abnormal, tortuous vessels along the lateral aspect of the left temporal lobe compatible with an AVF for AVM.  CT venogram negative for venous thrombosis Repeat HCT 8/12 showed Stable large left temporal lobe hemorrhage and edema since yesterday. Stable intraventricular hemorrhage and EVD along with mild to moderate enlargement of the left temporal lobe.Stable intracranial mass effect with up to 9 mm rightward midline shift. Basilar cisterns remain patent. No new intracranial abnormality 2D Echo EF 60-65%, No shunt or thrombus detected LDL 119 HgbA1c 5.7 VTE prophylaxis - Heparin No antiplatelet or anticoagulants prior to admission per chart review, now on no antithrombotics due to [place] Therapy recommendations: AIR.  Disposition:  TBD  Cerebral Edema Obstructive hydrocephalus 8/10: started 3% HTS  8/12: stopped 3% HTS Na 151 this AM EVD placed. NSG is following.  Continue frequent neuro checks per ICU protocol CCM following, appreciate their help.  Agitated delirium Was previously on low-dose precedex.  Patient received ativan, restarted on low dose precedex yesterday. Discontinued ativan order. Recommend minimizing benzo use. Administer seroquel earlier (changed from 11pm->8pm).   Left temporal AVF/ AVM Neurosurgery following CTA more clearly showing this as likely source of hemorrhage Dr. SLeonie Mandiscussed with neurosurgery. Plan for angiogram and definitive treatment of AVM by Dr. NKathyrn Sheriff  HTN No hx of hypertension or home BP meds prior to admission Unstable  Cleviprex drip off. Now on norvasc '10mg'$ , lisinopril '20mg'$ ,  Long term BP goal normotensive.  Hyperlipidemia Home meds: None  LDL 119, goal < 70 High intensity statin to be considered at discharge in setting of hemorrhagic stroke    Dysphagia Started tube feeding per RD recs Core track placed On IVF, wean off on feeding started and tolerated   Elimination Foley catheter in place, high risk for urinary retention due to AMS Consider bowel regimen when appropriate  Tobacco abuse Current smoker 1PPD, 30 pack year hx Smoking cessation counseling will be provided  Other Stroke Risk Factors Advanced Age >/= 624 Current ETOH use, alcohol level <10, will be advised to drink no more than 1 drink a day Incidentally noted previous cerebellar stroke   Hypokalemia  Potassium repleted. Continue to monitor   Other [facility] day # 5  Rolanda Lundborg, MD, PGY-1 04/03/2022 12:03 PM    STROKE MD NOTE :  I have personally obtained history,examined this patient, reviewed notes, independently viewed imaging studies, participated in medical decision making and plan of care.ROS completed by me personally and pertinent positives fully documented  I have made any additions or clarifications directly to the above note. Agree with note above.  Patient became agitated last night and required Ativan and appears sedated this morning and somnolent and not following commands as well but moving extremities well.  Cerebral catheter angiogram is postponed and may not happen today as per neurosurgery.  Ventriculostomy is draining well.  Blood pressure adequately controlled.  Serum sodium remains at goal.  Long discussion at the bedside with patient's son and answered questions about her care.  Discussed with Dr. Lynetta Mare critical care medicine. This patient is critically ill and at significant risk of neurological worsening, death and care requires constant monitoring of vital signs, hemodynamics,respiratory and cardiac monitoring, extensive review of multiple databases, frequent neurological assessment, discussion with family, other specialists and medical decision making of high complexity.I have made any additions or  clarifications directly to the above note.This critical care time does not reflect procedure time, or teaching time or supervisory time of PA/NP/Med Resident etc but could involve care discussion time.  I spent 30 minutes of neurocritical care time  in the care of  this patient.     Antony Contras, MD Medical Director [facility] Pager: [phone] 04/03/2022 4:43 PM  To contact Stroke Continuity provider, please refer to [email or web address] After hours, contact General Neurology

## 2022-04-04 ENCOUNTER — Inpatient Hospital Stay (HOSPITAL_COMMUNITY): Payer: 59

## 2022-04-04 ENCOUNTER — Other Ambulatory Visit (HOSPITAL_COMMUNITY): Payer: Self-pay | Admitting: Interventional Radiology

## 2022-04-04 DIAGNOSIS — I611 Nontraumatic intracerebral hemorrhage in hemisphere, cortical: Secondary | ICD-10-CM | POA: Diagnosis not present

## 2022-04-04 HISTORY — PX: IR US GUIDE VASC ACCESS RIGHT: IMG2390

## 2022-04-04 HISTORY — PX: IR ANGIO INTRA EXTRACRAN SEL INTERNAL CAROTID BILAT MOD SED: IMG5363

## 2022-04-04 LAB — GLUCOSE, CAPILLARY
Glucose-Capillary: 111 mg/dL — ABNORMAL HIGH (ref 70–99)
Glucose-Capillary: 140 mg/dL — ABNORMAL HIGH (ref 70–99)

## 2022-04-04 LAB — CBC
HCT: 40.4 % (ref 36.0–46.0)
Hemoglobin: 13.2 g/dL (ref 12.0–15.0)
MCH: 29.9 pg (ref 26.0–34.0)
MCHC: 32.7 g/dL (ref 30.0–36.0)
MCV: 91.6 fL (ref 80.0–100.0)
Platelets: 180 10*3/uL (ref 150–400)
RBC: 4.41 MIL/uL (ref 3.87–5.11)
RDW: 14 % (ref 11.5–15.5)
WBC: 10 10*3/uL (ref 4.0–10.5)
nRBC: 0 % (ref 0.0–0.2)

## 2022-04-04 LAB — BASIC METABOLIC PANEL
Anion gap: 9 (ref 5–15)
BUN: 24 mg/dL — ABNORMAL HIGH (ref 8–23)
CO2: 25 mmol/L (ref 22–32)
Calcium: 8.9 mg/dL (ref 8.9–10.3)
Chloride: 116 mmol/L — ABNORMAL HIGH (ref 98–111)
Creatinine, Ser: 0.56 mg/dL (ref 0.44–1.00)
GFR, Estimated: >60 mL/min (ref >60)
Glucose, Bld: 110 mg/dL — ABNORMAL HIGH (ref 70–99)
Potassium: 3.3 mmol/L — ABNORMAL LOW (ref 3.5–5.1)
Sodium: 150 mmol/L — ABNORMAL HIGH (ref 135–145)

## 2022-04-04 MED ORDER — IOHEXOL 300 MG/ML  SOLN
100.0000 mL | Freq: Once | INTRAMUSCULAR | Status: DC | PRN
Start: 2022-04-04 — End: 2022-04-16

## 2022-04-04 MED ORDER — LIDOCAINE HCL 1 % IJ SOLN
INTRAMUSCULAR | Status: AC
  Filled 2022-04-04: qty 20

## 2022-04-04 MED ORDER — MIDAZOLAM HCL 2 MG/2ML IJ SOLN
INTRAMUSCULAR | Status: AC
  Filled 2022-04-04: qty 2

## 2022-04-04 MED ORDER — FENTANYL CITRATE (PF) 100 MCG/2ML IJ SOLN
INTRAMUSCULAR | Status: AC | PRN
  Administered 2022-04-04: 50 ug via INTRAVENOUS

## 2022-04-04 MED ORDER — DEXAMETHASONE SODIUM PHOSPHATE 10 MG/ML IJ SOLN
6.0000 mg | INTRAMUSCULAR | Status: AC
  Administered 2022-04-04 – 2022-04-06 (×3): 6 mg via INTRAVENOUS
  Filled 2022-04-04 (×3): qty 1

## 2022-04-04 MED ORDER — FENTANYL CITRATE (PF) 100 MCG/2ML IJ SOLN
INTRAMUSCULAR | Status: AC
  Filled 2022-04-04: qty 2

## 2022-04-04 MED ORDER — POTASSIUM CHLORIDE 20 MEQ PO PACK
40.0000 meq | PACK | Freq: Once | ORAL | Status: AC
  Administered 2022-04-04: 40 meq
  Filled 2022-04-04: qty 2

## 2022-04-04 NOTE — Progress Notes (Addendum)
Hello Krishna mannose 2.  STROKE TEAM PROGRESS NOTE   INTERVAL HISTORY Sister at the bedside. EVD with 168m overnight. Na 150 this AM, now off 3% drip for 96 hours. WBC 10. Afebrile. She was agitated and removed feeding tube overnight. Did not receive scheduled seroquel and did not require precedex overnight. Feeding tube replaced this AM.   She has increased alertness today. Gaze able to move horizontally, limited vertical gaze. Moves all 4 extremities well without focal weakness. She continues to have receptive aphasia. Will answer social questions appropriately but then when asked further details is unable to answer appropriately.   Vitals:   04/04/22 0740 04/04/22 0800 04/04/22 0900 04/04/22 1000  BP:  (!) 121/95 129/73 119/78  Pulse:  93 84 100  Resp:  '20 18 15  '$ Temp:  98.3 F (36.8 C)    TempSrc:  Axillary    SpO2: 92% 94% 93% 93%  Weight:      Height:       CBC:  Recent Labs  Lab 03/29/22 1533 03/31/22 0608 04/03/22 0554 04/04/22 0625  WBC 9.3   < > 11.1* 10.0  NEUTROABS 5.5  --   --   --   HGB 14.5   < > 12.9 13.2  HCT 44.8   < > 40.0 40.4  MCV 91.6   < > 92.2 91.6  PLT 207   < > 181 180   < > = values in this interval not displayed.   Basic Metabolic Panel:  Recent Labs  Lab 04/01/22 0616 04/01/22 1158 04/02/22 0602 04/02/22 1200 04/03/22 0554 04/04/22 0625  NA 155*   < > 153*   < > 151* 150*  K 2.9*  --  3.3*  --  3.6 3.3*  CL 123*  --  121*  --  119* 116*  CO2 24  --  27  --  27 25  GLUCOSE 108*  --  141*  --  115* 110*  BUN 20  --  26*  --  28* 24*  CREATININE 0.74  --  0.43*  --  0.54 0.56  CALCIUM 9.4  --  8.7*  --  9.0 8.9  MG 1.8  --  2.1  --   --   --   PHOS  --   --  3.4  --   --   --    < > = values in this interval not displayed.   Lipid Panel:  Recent Labs  Lab 03/29/22 2040  CHOL 190  TRIG 28  HDL 65  CHOLHDL 2.9  VLDL 6  LDLCALC 119*   HgbA1c:  Recent Labs  Lab 03/29/22 2040  HGBA1C 5.7*   Urine Drug Screen:  Recent Labs   Lab 03/29/22 1654  LABOPIA NONE DETECTED  COCAINSCRNUR NONE DETECTED  LABBENZ NONE DETECTED  AMPHETMU NONE DETECTED  THCU NONE DETECTED  LABBARB NONE DETECTED    Alcohol Level  Recent Labs  Lab 03/29/22 1533  ETH <10    IMAGING past 24 hours DG Abd Portable 1V  Result Date: 04/04/2022 CLINICAL DATA:  Nasogastric tube placement EXAM: PORTABLE ABDOMEN - 1 VIEW COMPARISON:  March 30, 2022 FINDINGS: The bowel gas pattern is normal. No radio-opaque calculi or other significant radiographic abnormality are seen. The distal tip of the nasogastric tube projects over the stomach. Partially visualized central venous catheter, terminating near the superior cavoatrial junction. The visualized bibasilar lungs are clear. IMPRESSION: The distal tip of the nasogastric tube is in the  stomach. Electronically Signed   By: Beryle Flock M.D.   On: 04/04/2022 10:21    PHYSICAL EXAM 66 yo female lying in bed with EVD in place. She is alert and oriented to self. Not oriented to situation, time, place. Nonfluent speech with mixed receptive and expressive aphasia.  She is spontaneously moving all extremities with left mitten restraint in place.  Not able to follow simple commands.  Gaze preference to left but able to cross midline to right. Limited vertical gaze. Cranial nerves: PERRL. Moves all 4 extremities without focal weakness.    ASSESSMENT/PLAN 66 year old female with history of COPD, smoker admitted for headache, nausea, vomiting, found down in bathroom with decreased level consciousness.  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  CT repeat showed mild increase in size. NSG consulted. Dr. Arnoldo Morale on board. Status post EVD. MRI with and without contrast showed 68 cc ICH, with IVH and midline shift 44m.  Repeat CT showed stable hematoma but midline shift up to 1 cm.  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   CTV no this thrombosis.  EF 60 to 65%, LDL 119, A1c 5.7, UDS negative.  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  Impending left uncal herniation.  Midline shift 5 mm Repeat HCT 2 hours after Interval increase in size of Left IPH. New IPH within the left uncus/medial temporal lobe.  MRI  03/29/22 - 68 mL intraparenchymal hemorrhage centered at the left temporal lobe, similar in size as compared to prior CT. midline shift 7 mm CT repeat 03/30/22 stable hematoma but increased midline shift to 1 cm CTA Head and Neck Cluster of abnormal, tortuous vessels along the lateral aspect of the left temporal lobe compatible with an AVF for AVM.  CT venogram negative for venous thrombosis Repeat HCT 8/12 showed Stable large left temporal lobe hemorrhage and edema since yesterday. Stable intraventricular hemorrhage and EVD along with mild to moderate enlargement of the left temporal lobe.Stable intracranial mass effect with up to 9 mm rightward midline shift. Basilar cisterns remain patent. No new intracranial abnormality 2D Echo EF 60-65%, No shunt or thrombus detected LDL 119 HgbA1c 5.7 VTE prophylaxis - Heparin No antiplatelet or anticoagulants prior to admission per chart review, now on no antithrombotics due to [place] Therapy recommendations: AIR.  Disposition:  TBD  Cerebral Edema Obstructive hydrocephalus 8/10: started 3% HTS  8/12: stopped 3% HTS Na 151->150 this AM EVD placed. NSG is following. Per NSGY, tentative plan to remove ventriculostomy in next day or 2.  Continue frequent neuro checks per ICU protocol CCM following, appreciate their help.  Agitated delirium Was previously on low-dose precedex.  Patient received ativan, restarted on low dose precedex yesterday. Discontinued ativan order. Recommend minimizing benzo use. Administer seroquel earlier (changed from 11pm->8pm).   Left  temporal AVF/ AVM Neurosurgery following CTA more clearly showing this as likely source of hemorrhage Dr. SLeonie Mandiscussed with neurosurgery. Plan for angiogram and definitive treatment of AVM by Dr. NKathyrn Sheriff  HTN No hx of hypertension or home BP meds prior to admission Unstable  Cleviprex drip off. Now on norvasc '10mg'$ , lisinopril '20mg'$ ,  Long term BP goal normotensive.  Hyperlipidemia Home meds: None  LDL 119, goal < 70 High intensity statin to be considered at discharge in setting of hemorrhagic stroke  Dysphagia Started tube feeding per RD recs Core track placed On IVF, wean off on feeding started and tolerated   Elimination Foley catheter in place, high risk for urinary retention due to AMS Consider bowel regimen when appropriate  Tobacco abuse Current smoker 1PPD, 30 pack year hx Smoking cessation counseling will be provided  Other Stroke Risk Factors Advanced Age >/= 9  Current ETOH use, alcohol level <10, will be advised to drink no more than 1 drink a day Incidentally noted previous cerebellar stroke   Hypokalemia Potassium repleted. Continue to monitor   Other Active Problems COPD/asthma  Hospital day # 6  Rolanda Lundborg, MD, PGY-1 04/04/2022 10:32 AM  I have personally obtained history,examined this patient, reviewed notes, independently viewed imaging studies, participated in medical decision making and plan of care.ROS completed by me personally and pertinent positives fully documented  I have made any additions or clarifications directly to the above note. Agree with note above.  Patient neurological exam is showing slight improvement but she remains with fluent aphasia agitation.  Continue ventriculostomy drainage as per neurosurgery and hopefully will try to increase pop-off today.  Diagnostic cerebral catheter angiogram later today as per neurosurgery.  Continue blood pressure control with systolic goal below 500.  Continue ongoing therapies.  Discussed  with sister at the bedside and answered questions.This patient is critically ill and at significant risk of neurological worsening, death and care requires constant monitoring of vital signs, hemodynamics,respiratory and cardiac monitoring, extensive review of multiple databases, frequent neurological assessment, discussion with family, other specialists and medical decision making of high complexity.I have made any additions or clarifications directly to the above note.This critical care time does not reflect procedure time, or teaching time or supervisory time of PA/NP/Med Resident etc but could involve care discussion time.  I spent 30 minutes of neurocritical care time  in the care of  this patient.      Antony Contras, MD Medical Director [facility] Stroke Center Pager: [phone] 04/04/2022 3:08 PM  To contact Stroke Continuity provider, please refer to [email or web address] After hours, contact General Neurology

## 2022-04-04 NOTE — Progress Notes (Signed)
Speech Language Pathology Treatment: Dysphagia;Cognitive-Linquistic  Patient Details Name: [name] Giles MRN: [id number] DOB: 04/15/1956 Today's Date: 04/04/2022 Time: 1138-1203 SLP Time Calculation (min) (ACUTE ONLY): 25 min  Assessment / Plan / Recommendation Clinical Impression  Pt seen for dysphagia, aphasia and cognitive impairments with sister, Juliann Pulse at bedside. Alertness improved today and awake for entire session. She initiated teeth brushing with assist to complete task and hand over hand for self feeding. Sustained attention for trials of puree and water via cup and spoon with intermittent verbal cues. She had a baseline cough due to COPD which became more consistent with sips water concerning for possible airway intrusion. Pt took large sips with therapist assisting to control volume. Orally manipulated puree with minimal hesitations due to decreased cognition/attention. Pt will likely need instrumental assessment prior to solids/liquids. Recommend ice chips PRN after oral care. Plans for arteriogram this afternoon to evaluate status of hemorrhage. ST will continue for plan of care as relates to swallowing.  She is demonstrating elements of Wernicke's aphasia and had difficulty comprehending basic language and was unable to follow commands without tactile cues. Written language was not effective to facilitate understanding. Verbal output consisted of intermittent neologisms and empty speech with periods of accurate verbal interactions. Will continue to see pt with diagnostic therapy to further evaluate language and cognition.    HPI HPI: Pt is a 66 y/o female who presented with headache, N/V, and AMS. CTH revealed a large L frontotemporal ICH with IVH and 24m midline shift. Repeat head CT with increasing hemorrhage and associated hydrocephalus. Pt s/p ventriculostomy 8/10. Cortrak placed 8/11. PMH: COPD, tobacco abuse, asthma.      SLP Plan  Continue with current plan of care       Recommendations for follow up therapy are one component of a multi-disciplinary discharge planning process, led by the attending physician.  Recommendations may be updated based on patient status, additional functional criteria and insurance authorization.    Recommendations  Diet recommendations: Other(comment) (ice chips) Liquids provided via: Teaspoon Medication Administration: Via alternative means Supervision: Staff to assist with self feeding Compensations: Slow rate;Small sips/bites Postural Changes and/or Swallow Maneuvers: Seated upright 90 degrees                General recommendations: Rehab consult Oral Care Recommendations: Oral care BID Follow Up Recommendations: Acute inpatient rehab (3hours/day) Assistance recommended at discharge: Frequent or constant Supervision/Assistance SLP Visit Diagnosis: Aphasia (R47.01);Cognitive communication deficit (R41.841);Dysphagia, unspecified (R13.10) Plan: Continue with current plan of care           LHouston Siren 04/04/2022, 12:17 PM

## 2022-04-04 NOTE — Sedation Documentation (Signed)
Pt is not being cooperative and moving all over the table. Provider unable to get sheath in. Provider decided not to proceed with angiogram at this time.

## 2022-04-04 NOTE — Progress Notes (Signed)
Pharmacy Electrolyte Replacement  Recent Labs:  Recent Labs    04/02/22 0602 04/03/22 0554 04/04/22 0625  K 3.3*   < > 3.3*  MG 2.1  --   --   PHOS 3.4  --   --   CREATININE 0.43*   < > 0.56   < > = values in this interval not displayed.    Low Critical Values (K </= 2.5, Phos </= 1, Mg </= 1) Present: None  Plan: 36mq Kcl per tube x 1

## 2022-04-04 NOTE — Progress Notes (Signed)
Physical Therapy Treatment Patient Details Name: [name] Giles MRN: [id number] DOB: [birth year] Today's Date: 04/04/2022   History of Present Illness [name] Giles is a 66 y.o. female presenting 63/10 after being found on the bathroom at work, head CT revealed a large left frontotemporal ICH with IVH and 5 mm midline shift and ballooning of the temporal horns concerning for obstructive hydrocephalus s/p placement of right frontal ventriculostomy via bur hole 8/10. PHMx: COPD, tobacco abuse and asthma.    PT Comments    Patient more awake this session but continues to demonstrate receptive difficulties. Patient following commands <25% of the time and benefits from demonstration. Patient required modA+2 to stand from EOB and take sidesteps towards [facility] with HHAx2. Unable to progress gait due to fatigue this date. Continue to recommend acute inpatient rehab (AIR) for post-acute therapy needs.    Recommendations for follow up therapy are one component of a multi-disciplinary discharge planning process, led by the attending physician.  Recommendations may be updated based on patient status, additional functional criteria and insurance authorization.  Follow Up Recommendations  Acute inpatient rehab (3hours/day)     Assistance Recommended at Discharge Frequent or constant Supervision/Assistance  Patient can return home with the following Two people to help with walking and/or transfers;A lot of help with bathing/dressing/bathroom;Assistance with feeding;Assistance with cooking/housework;Direct supervision/assist for medications management;Direct supervision/assist for financial management;Assist for transportation;Help with stairs or ramp for entrance   Equipment Recommendations  Other (comment) (TBD)    Recommendations for Other Services       Precautions / Restrictions Precautions Precautions: Fall Precaution Comments: cortrak, EVD, wrist restraints, mittens Restrictions Weight Bearing  Restrictions: No     Mobility  Bed Mobility Overal bed mobility: Needs Assistance Bed Mobility: Supine to Sit, Sit to Supine     Supine to sit: Min assist Sit to supine: Min assist, +2 for physical assistance, +2 for safety/equipment   General bed mobility comments: able to come to EOB without assistance but required assist to scoot hips forward due to inability to follow command. Assist to return back to supine with patient unaware of legs still hanging off bed    Transfers Overall transfer level: Needs assistance Equipment used: 2 person hand held assist Transfers: Sit to/from Stand, Bed to chair/wheelchair/BSC Sit to Stand: Mod assist, +2 physical assistance   Step pivot transfers: Mod assist, +2 physical assistance       General transfer comment: stood from EOB with modA+2 for boost up and steady. Able to take sidesteps towards [facility] with modA+2 for balance    Ambulation/Gait                   Stairs             Wheelchair Mobility    Modified Rankin (Stroke Patients Only)       Balance Overall balance assessment: Needs assistance Sitting-balance support: Feet supported, No upper extremity supported Sitting balance-Leahy Scale: Fair Sitting balance - Comments: fair for static sitting. With dynamic task, patient required minA as she began to demo R lateral lean   Standing balance support: Bilateral upper extremity supported Standing balance-Leahy Scale: Poor Standing balance comment: modA+2 to maintain standing                            Cognition Arousal/Alertness: Awake/alert Behavior During Therapy: Flat affect Overall Cognitive Status: Impaired/Different from baseline Area of Impairment: Orientation, Attention, Memory, Following commands, Safety/judgement, Awareness, Problem  solving                 Orientation Level: Disoriented to, Place, Time, Situation Current Attention Level: Focused Memory: Decreased short-term  memory Following Commands: Follows one step commands inconsistently Safety/Judgement: Decreased awareness of safety, Decreased awareness of deficits Awareness: Intellectual Problem Solving: Slow processing, Decreased initiation, Difficulty sequencing General Comments: more awake this session. Following commands appropriately <25% of the time. Oriented to self        Exercises      General Comments        Pertinent Vitals/Pain Pain Assessment Pain Assessment: Faces Faces Pain Scale: No hurt Pain Intervention(s): Monitored during session    Home Living                          Prior Function            PT Goals (current goals can now be found in the care plan section) Acute Rehab PT Goals PT Goal Formulation: Patient unable to participate in goal setting Time For Goal Achievement: 04/16/22 Potential to Achieve Goals: Good Progress towards PT goals: Progressing toward goals    Frequency    Min 4X/week      PT Plan Current plan remains appropriate    Co-evaluation PT/OT/SLP Co-Evaluation/Treatment: Yes Reason for Co-Treatment: For patient/therapist safety;To address functional/ADL transfers;Necessary to address cognition/behavior during functional activity PT goals addressed during session: Balance;Mobility/safety with mobility        AM-PAC PT "6 Clicks" Mobility   Outcome Measure  Help needed turning from your back to your side while in a flat bed without using bedrails?: A Lot Help needed moving from lying on your back to sitting on the side of a flat bed without using bedrails?: A Little Help needed moving to and from a bed to a chair (including a wheelchair)?: Total Help needed standing up from a chair using your arms (e.g., wheelchair or bedside chair)?: Total Help needed to walk in hospital room?: Total Help needed climbing 3-5 steps with a railing? : Total 6 Click Score: 9    End of Session   Activity Tolerance: Patient tolerated  treatment well Patient left: in bed;with call bell/phone within reach (handoff to SLP) Nurse Communication: Mobility status PT Visit Diagnosis: Unsteadiness on feet (R26.81);Other abnormalities of gait and mobility (R26.89);Repeated falls (R29.6);Muscle weakness (generalized) (M62.81)     Time: 7517-0017 PT Time Calculation (min) (ACUTE ONLY): 25 min  Charges:  $Therapeutic Activity: 8-22 mins                     Marzella Miracle A. Gilford Rile PT, DPT Acute Rehabilitation Services Office [phone]    Linna Hoff 04/04/2022, 12:47 PM

## 2022-04-04 NOTE — Sedation Documentation (Signed)
As per Dr. Kathyrn Sheriff, ASA 4, Airway 2. Medication to be administered is only fentanyl to start.

## 2022-04-04 NOTE — Progress Notes (Signed)
I attempted to gain access the the right CFA however I was unable to successfully advance a cope mandril wire into the CFA. A single more proximal stick of the micropuncture needle did allow me to advance the wire however the access point appeared to be proximal to the inguinal ligament and I removed the needle. During this time, patient was moving a great deal and not verbally orientable. I therefore elected to abort the procedure as we are unlikely to get meaningful angiographic images with that degree of patient motion. A small groin hematoma was noted and pressure on the groin was held. We will attempt repeat angio at a later date when the patient's mental status improves, or consider angio with anesthesia if it does not.  [name], MD [facility]

## 2022-04-04 NOTE — Progress Notes (Signed)
  NEUROSURGERY PROGRESS NOTE   No issues overnight.   EXAM:  BP 99/62 (BP Location: Left Arm)   Pulse 92   Temp 98.3 F (36.8 C) (Axillary)   Resp 16   Ht '5\' 5"'$  (1.651 m)   Wt 66.2 kg   SpO2 94%   BMI 24.29 kg/m   Awake, alert Stable mixed aphasia CN grossly intact  MAE to command EVD in place, patent  IMPRESSION:  66 y.o. female s/p IVH with likely dAVF  PLAN: - Angiogram later this pm  I have reviewed the details of the procedure with the patient's sister. I reviewed the risks, benefits, and alternatives to the procedure as well as the expected postprocedural course.  All her questions today were answered and she provided informed consent on the patient's behalf.   [name], MD [facility] [place]    [name], MD [facility]

## 2022-04-04 NOTE — Progress Notes (Signed)
NAME:  [name] Giles, MRN:  [id number], DOB:  1956/01/13, LOS: 6 ADMISSION DATE:  03/29/2022, CONSULTATION DATE: 03/31/2022 REFERRING MD: Dr. Leonie Man, CHIEF COMPLAINT: Encephalopathy  History of Present Illness:  66 year old woman with history of tobacco use (30 pack years) and COPD/asthma.  She was admitted 8/10 with severe headache, N/V, involving decreased mental status.  Head CT in the [facility] showed large left frontotemporal ICH with IVH and 5 mm midline shift.  Started on Cleviprex for hypertension, moved to [facility] for further care.  Repeat head CT with increasing hemorrhage and associated hydrocephalus.  Ventriculostomy placed on 8/10.  Subsequent course complicated by persistent hypertension as well as agitated delirium.  Precedex started on 8/11   Pertinent  Medical History   Past Medical History:  Diagnosis Date   Asthma    Medical history non-contributory     Significant Hospital Events: Including procedures, antibiotic start and stop dates in addition to other pertinent events   8/10 presented with encephalopathy, suppressed mental status in the setting of large left frontotemporal ICH plus IVH 8/10 ventriculostomy placed (Dr. Arnoldo Morale) 8/10 MRI brain > 68 cc left temporal ICH, surrounding vasogenic edema, 1 cm left right shift.  Heterogeneous parenchyma along the lateral margin of the hemorrhage, question underlying vascular lesion.  Intraventricular extension, ventriculostomy in place 8/11 CT angio head and neck > unchanged large ICH left temporal lobe with surrounding edema.  Stable mass effect 10 mm.  Unchanged IVH with been trach in place.  Patent internal carotid, vertebral arteries.  Cluster of abnormal tortuous vessels on the lateral aspect of the left temporal lobe compatible with an AVF or AVM 8/11 Precedex for agitated delirium 8/12 repeat head CT > stable IVH.  Stable right shift, estimated 9 mm.  No new findings  Interim History / Subjective:   Improved  agitation. Pulled Cortrak yesterday but did not require Precedex overnight.   Objective   Blood pressure (!) 140/73, pulse (!) 106, temperature 98 F (36.7 C), temperature source Oral, resp. rate 18, height '5\' 5"'$  (1.651 m), weight 66.2 kg, SpO2 92 %.        Intake/Output Summary (Last 24 hours) at 04/04/2022 0746 Last data filed at 04/04/2022 0700 Gross per 24 hour  Intake 470 ml  Output 722 ml  Net -252 ml    Filed Weights   03/29/22 2000  Weight: 66.2 kg    Examination: General: Ill-appearing woman, no distress, in restraints HENT: Ventricular drain in place, incision clean and dry Lungs: Distant, clear, no wheeze. ++ cough Cardiovascular: Regular, no murmur Abdomen: Nondistended, positive bowel sounds Extremities: No edema Neuro: Eyes open.  She will track.  Answers some questions and will follow simple commands.  Some perseveration.  No current agitation.  Ancillary tests personally reviewed:   Large left sided ICH with intraventricular extension and EVD in place. Na: 151 Assessment & Plan:   Critically ill due to Large left temporal ICH, IVH with hydrocephalus.  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 Can stop and allow Na to drift back to normal.  -Ventricular drain management as per neurosurgery -Planning for angiography for her AVF/AVM on 8/16  Hypertension -Goal SBP 130-150 -Cleviprex weaned to off -Continue amlodipine and lisinopril as ordered -Hydralazine and labetalol available if needed  Agitated delirium due to the above.  No significant alcohol use per the patient's son -  Continue Seroquel and prn haloperidol  -Decrease neurochecks to q4h to avoid worsening sleep deprivation.  COPD/asthma.  Continued tobacco use -Started on nebulized LAMA, LABA and ICS. Would convert to MDI's closer to  discharge if patient able to take.  - Short course of steroids for COPD exacerbation.  -Smoking cessation counseling -Nicotine patch if she needs it  Nutrition -Currently on tube feeds. Mental status prevents safe trial of oral intake.   Best Practice (right click and "Reselect all SmartList Selections" daily)   Diet/type: NPO continue tube feeds  DVT prophylaxis: SCD - bleed stable, can start chemical prophylaxis,  GI prophylaxis: not indicated.  Lines: N/A Foley:  Yes, and it is still needed Code Status:  full code Last date of multidisciplinary goals of care discussion [pending] Family: Discussed plans, patient status with the patient's son at bedside on 8/12  Kipp Brood, MD [facility] ICU Physician [place]  Pager: [phone] Mobile: [phone] After hours: [phone].

## 2022-04-04 NOTE — Sedation Documentation (Signed)
ICU RN came down to IR and transported pt back to ICU. No bleeding noted to the right groin site. This RN assisted in transporting pt to 4N ICU with the primary RN.

## 2022-04-04 NOTE — Progress Notes (Signed)
Occupational Therapy Treatment Patient Details Name: [name] Giles MRN: [id number] DOB: 01/13/1956 Today's Date: 04/04/2022   History of present illness [name] Giles is a 66 y.o. female presenting 8/10 after being found on the bathroom at work, head CT revealed a large left frontotemporal ICH with IVH and 5 mm midline shift and ballooning of the temporal horns concerning for obstructive hydrocephalus s/p placement of right frontal ventriculostomy via bur hole 8/10. PHMx: COPD, tobacco abuse and asthma.   OT comments  Patient with improved alertness and participation this date.  Generalized Min A of 2 for mobility due to lines and safety.  Able to sit EOB and perform light grooming task and place sock with Min Guard to Min A.  Continue OT in the acute setting with AIR continuing to be recommended post acute.     Recommendations for follow up therapy are one component of a multi-disciplinary discharge planning process, led by the attending physician.  Recommendations may be updated based on patient status, additional functional criteria and insurance authorization.    Follow Up Recommendations  Acute inpatient rehab (3hours/day)    Assistance Recommended at Discharge Frequent or constant Supervision/Assistance  Patient can return home with the following  A lot of help with walking and/or transfers;A lot of help with bathing/dressing/bathroom;Assistance with cooking/housework;Direct supervision/assist for medications management;Direct supervision/assist for financial management;Assist for transportation;Help with stairs or ramp for entrance   Equipment Recommendations  BSC/3in1    Recommendations for Other Services Rehab consult    Precautions / Restrictions Precautions Precautions: Fall Precaution Comments: cortrak, EVD, wrist restraints, mittens Restrictions Weight Bearing Restrictions: No       Mobility Bed Mobility Overal bed mobility: Needs Assistance Bed Mobility:  Sidelying to Sit   Sidelying to sit: Min assist, +2 for physical assistance            Transfers Overall transfer level: Needs assistance   Transfers: Sit to/from Stand Sit to Stand: Min assist, +2 physical assistance                 Balance Overall balance assessment: Needs assistance Sitting-balance support: Feet supported, No upper extremity supported Sitting balance-Leahy Scale: Fair     Standing balance support: Bilateral upper extremity supported Standing balance-Leahy Scale: Poor                             ADL either performed or assessed with clinical judgement   ADL       Grooming: Wash/dry face;Min guard;Sitting           Upper Body Dressing : Moderate assistance;Sitting   Lower Body Dressing: Moderate assistance;Sit to/from stand                      Extremity/Trunk Assessment Upper Extremity Assessment Upper Extremity Assessment: Overall WFL for tasks assessed   Lower Extremity Assessment Lower Extremity Assessment: Defer to PT evaluation        Vision   Vision Assessment?: Vision impaired- to be further tested in functional context   Perception Perception Perception: Not tested Comments: No complaints of duuble vision, crossing midline, tracking well.   Praxis Praxis Praxis: Not tested    Cognition Arousal/Alertness: Awake/alert Behavior During Therapy: Flat affect Overall Cognitive Status: Impaired/Different from baseline Area of Impairment: Orientation, Attention, Memory, Following commands, Safety/judgement, Awareness, Problem solving                 Orientation Level:  Disoriented to, Place, Time, Situation Current Attention Level: Focused Memory: Decreased short-term memory Following Commands: Follows one step commands inconsistently Safety/Judgement: Decreased awareness of safety, Decreased awareness of deficits Awareness: Intellectual Problem Solving: Slow processing, Decreased initiation,  Difficulty sequencing                             Pertinent Vitals/ Pain       Pain Assessment Pain Assessment: No/denies pain Pain Intervention(s): Monitored during session                                                          Frequency  Min 2X/week        Progress Toward Goals  OT Goals(current goals can now be found in the care plan section)  Progress towards OT goals: Progressing toward goals  Acute Rehab OT Goals OT Goal Formulation: With patient Time For Goal Achievement: 04/16/22 Potential to Achieve Goals: Good ADL Goals Pt Will Perform Grooming: with min guard assist;standing Pt Will Perform Upper Body Dressing: with supervision;sitting Pt Will Perform Lower Body Dressing: with min assist;sit to/from stand Pt Will Transfer to Toilet: with min guard assist;ambulating;regular height toilet  Plan Discharge plan remains appropriate    Co-evaluation    PT/OT/SLP Co-Evaluation/Treatment: Yes Reason for Co-Treatment: For patient/therapist safety;To address functional/ADL transfers;Necessary to address cognition/behavior during functional activity PT goals addressed during session: Balance;Mobility/safety with mobility        AM-PAC OT "6 Clicks" Daily Activity     Outcome Measure   Help from another person eating meals?: Total Help from another person taking care of personal grooming?: A Little Help from another person toileting, which includes using toliet, bedpan, or urinal?: A Lot Help from another person bathing (including washing, rinsing, drying)?: A Lot Help from another person to put on and taking off regular upper body clothing?: A Lot Help from another person to put on and taking off regular lower body clothing?: A Lot 6 Click Score: 12    End of Session Equipment Utilized During Treatment: Gait belt  OT Visit Diagnosis: Unsteadiness on feet (R26.81);Other abnormalities of gait and mobility (R26.89);Muscle  weakness (generalized) (M62.81);Other symptoms and signs involving the nervous system (R29.898);Other symptoms and signs involving cognitive function   Activity Tolerance Patient tolerated treatment well   Patient Left in bed;with call bell/phone within reach;with bed alarm set;with family/visitor present   Nurse Communication Mobility status        Time: 1120-1145 OT Time Calculation (min): 25 min  Charges: OT General Charges $OT Visit: 1 Visit OT Treatments $Self Care/Home Management : 8-22 mins  04/04/2022  RP, OTR/L  Acute Rehabilitation Services  Office:  [phone]   Metta Clines 04/04/2022, 12:48 PM

## 2022-04-04 NOTE — Progress Notes (Signed)
Subjective: The patient is more alert.  She is mildly somnolent but easily arousable.  Her sister is at the bedside.  Objective: Vital signs in last 24 hours: Temp:  [98 F (36.7 C)-100.4 F (38 C)] 98.3 F (36.8 C) (08/16 0800) Pulse Rate:  [84-119] 84 (08/16 0900) Resp:  [14-28] 18 (08/16 0900) BP: (107-158)/(55-128) 129/73 (08/16 0900) SpO2:  [91 %-97 %] 93 % (08/16 0900) Estimated body mass index is 24.29 kg/m as calculated from the following:   Height as of this encounter: '5\' 5"'$  (1.651 m).   Weight as of this encounter: 66.2 kg.   Intake/Output from previous day: 08/15 0701 - 08/16 0700 In: 470 [I.V.:20; NG/GT:450] Out: 722 [Urine:505; Drains:217] Intake/Output this shift: Total I/O In: 20 [I.V.:20] Out: 15 [Drains:15]  Physical exam patient is mildly somnolent but easily arousable.  She will answer simple questions.  She is moving all 4 extremities well.  Her ventriculostomy is patent and pulsatile.  It appears to have low pressure.  Lab Results: Recent Labs    04/03/22 0554 04/04/22 0625  WBC 11.1* 10.0  HGB 12.9 13.2  HCT 40.0 40.4  PLT 181 180   BMET Recent Labs    04/03/22 0554 04/04/22 0625  NA 151* 150*  K 3.6 3.3*  CL 119* 116*  CO2 27 25  GLUCOSE 115* 110*  BUN 28* 24*  CREATININE 0.54 0.56  CALCIUM 9.0 8.9    Studies/Results: No results found.  Assessment/Plan: Intracerebral hemorrhage, interventricular hemorrhage, hydrocephalus: The patient's ventriculostomy is not draining much and appears to be at low pressure.  I will elevate it.  We will likely remove it in the next day or 2.  Possible AVM or AV fistula: She will eventually need an arteriogram.  I have answered all the patient's sister's questions.  LOS: 6 days     Ophelia Charter 04/04/2022, 10:02 AM     Patient ID: [name], female   DOB: [birth year], 66 y.o.   MRN: [id number]

## 2022-04-04 NOTE — Procedures (Signed)
Cortrak  Person Inserting Tube:  Alroy Dust, Nyanna Heideman L, RD Tube Type:  Cortrak - 43 inches Tube Size:  10 Tube Location:  Left nare Secured by: Bridle Technique Used to Measure Tube Placement:  Marking at nare/corner of mouth Cortrak Secured At:  64 cm   Cortrak Tube Team Note:  Consult received to place a Cortrak feeding tube.   X-ray is required, abdominal x-ray has been ordered by the Cortrak team. Please confirm tube placement before using the Cortrak tube.   If the tube becomes dislodged please keep the tube and contact the Cortrak team at [email or web address] ([email or web address]) for replacement.  If after hours and replacement cannot be delayed, place a NG tube and confirm placement with an abdominal x-ray.    Hermina Barters RD, LDN Clinical Dietitian See Shea Evans for contact information.

## 2022-04-05 DIAGNOSIS — I611 Nontraumatic intracerebral hemorrhage in hemisphere, cortical: Secondary | ICD-10-CM | POA: Diagnosis not present

## 2022-04-05 LAB — BASIC METABOLIC PANEL
Anion gap: 7 (ref 5–15)
BUN: 39 mg/dL — ABNORMAL HIGH (ref 8–23)
CO2: 28 mmol/L (ref 22–32)
Calcium: 9.4 mg/dL (ref 8.9–10.3)
Chloride: 115 mmol/L — ABNORMAL HIGH (ref 98–111)
Creatinine, Ser: 0.81 mg/dL (ref 0.44–1.00)
GFR, Estimated: >60 mL/min (ref >60)
Glucose, Bld: 162 mg/dL — ABNORMAL HIGH (ref 70–99)
Potassium: 3.8 mmol/L (ref 3.5–5.1)
Sodium: 150 mmol/L — ABNORMAL HIGH (ref 135–145)

## 2022-04-05 LAB — COMPREHENSIVE METABOLIC PANEL
ALT: 26 U/L (ref 0–44)
AST: 20 U/L (ref 15–41)
Albumin: 3 g/dL — ABNORMAL LOW (ref 3.5–5.0)
Alkaline Phosphatase: 72 U/L (ref 38–126)
Anion gap: 6 (ref 5–15)
BUN: 33 mg/dL — ABNORMAL HIGH (ref 8–23)
CO2: 25 mmol/L (ref 22–32)
Calcium: 8.8 mg/dL — ABNORMAL LOW (ref 8.9–10.3)
Chloride: 117 mmol/L — ABNORMAL HIGH (ref 98–111)
Creatinine, Ser: 0.67 mg/dL (ref 0.44–1.00)
GFR, Estimated: >60 mL/min (ref >60)
Glucose, Bld: 134 mg/dL — ABNORMAL HIGH (ref 70–99)
Potassium: 3.7 mmol/L (ref 3.5–5.1)
Sodium: 148 mmol/L — ABNORMAL HIGH (ref 135–145)
Total Bilirubin: 0.6 mg/dL (ref 0.3–1.2)
Total Protein: 6 g/dL — ABNORMAL LOW (ref 6.5–8.1)

## 2022-04-05 LAB — GLUCOSE, CAPILLARY
Glucose-Capillary: 144 mg/dL — ABNORMAL HIGH (ref 70–99)
Glucose-Capillary: 150 mg/dL — ABNORMAL HIGH (ref 70–99)
Glucose-Capillary: 147 mg/dL — ABNORMAL HIGH (ref 70–99)

## 2022-04-05 LAB — CBC WITH DIFFERENTIAL/PLATELET
Abs Immature Granulocytes: 0.06 10*3/uL (ref 0.00–0.07)
Basophils Absolute: 0 10*3/uL (ref 0.0–0.1)
Basophils Relative: 0 %
Eosinophils Absolute: 0 10*3/uL (ref 0.0–0.5)
Eosinophils Relative: 0 %
HCT: 39.4 % (ref 36.0–46.0)
Hemoglobin: 13.3 g/dL (ref 12.0–15.0)
Immature Granulocytes: 1 %
Lymphocytes Relative: 23 %
Lymphs Abs: 2.3 10*3/uL (ref 0.7–4.0)
MCH: 30.9 pg (ref 26.0–34.0)
MCHC: 33.8 g/dL (ref 30.0–36.0)
MCV: 91.4 fL (ref 80.0–100.0)
Monocytes Absolute: 1.2 10*3/uL — ABNORMAL HIGH (ref 0.1–1.0)
Monocytes Relative: 12 %
Neutro Abs: 6.4 10*3/uL (ref 1.7–7.7)
Neutrophils Relative %: 64 %
Platelets: 190 10*3/uL (ref 150–400)
RBC: 4.31 MIL/uL (ref 3.87–5.11)
RDW: 14 % (ref 11.5–15.5)
WBC: 10.1 10*3/uL (ref 4.0–10.5)
nRBC: 0 % (ref 0.0–0.2)

## 2022-04-05 LAB — MAGNESIUM: Magnesium: 2.3 mg/dL (ref 1.7–2.4)

## 2022-04-05 MED ORDER — LACTATED RINGERS IV BOLUS
500.0000 mL | Freq: Once | INTRAVENOUS | Status: AC
Start: 2022-04-05 — End: 2022-04-05
  Administered 2022-04-05: 500 mL via INTRAVENOUS

## 2022-04-05 MED ORDER — SODIUM CHLORIDE 0.9 % IV BOLUS
500.0000 mL | Freq: Once | INTRAVENOUS | Status: AC
  Administered 2022-04-05: 500 mL via INTRAVENOUS

## 2022-04-05 NOTE — Progress Notes (Signed)
At the start of this RN's shift, bed was saturated in urine.  Sheets changed and new purewick placed.  At 0300, pt has yet to void.  Bladder was scanned and it had 151 mL of urine in it.  CCM notified.  500 mL bolus LR ordered.  Do not I&O.  Will start the bolus and monitor for urine output.

## 2022-04-05 NOTE — Progress Notes (Signed)
SLP Cancellation Note  Patient Details Name: [name] Giles MRN: [id number] DOB: [birth year]   Cancelled treatment:        Attempted dysphagia/comm/language treatment following PT session. Pt could not sustain attention, falling asleep after 3-5 second intervals. Will continue efforts   Houston Siren 04/05/2022, 2:35 PM

## 2022-04-05 NOTE — Progress Notes (Signed)
[place] Progress Note Patient Name: [name] Giles DOB: 23-Dec-1955 MRN: [id number]   Date of Service  04/05/2022  HPI/Events of Note  Hypotension - BP dropped to 74/42 with MAP = 52. BP currently = 94/59 with MAP = 59.  LVEF = 65-65%.  eICU Interventions  Will bolus with 0.9 NaCl 500 mL IV over 30 minutes now.      Intervention Category Major Interventions: Other:  Lysle Dingwall 04/05/2022, 12:53 AM

## 2022-04-05 NOTE — Progress Notes (Signed)
[place] Progress Note Patient Name: YARENI CREPS DOB: April 10, 1956 MRN: [id number]   Date of Service  04/05/2022  HPI/Events of Note  Oliguria - Bladder scan with 151 mL.   eICU Interventions  Plan: Bolus with LR 500 mL IV over 30 minutes now.      Intervention Category Major Interventions: Other:  Lysle Dingwall 04/05/2022, 3:57 AM

## 2022-04-05 NOTE — Progress Notes (Addendum)
At 0000, pt became hypotensive.  BP rechecked multiple times and changed BP cuff to opposite arm.  BP still low.  Elink notified, no new orders at this time.    0053 NS 500 mL bolus ordered.

## 2022-04-05 NOTE — Progress Notes (Addendum)
STROKE TEAM PROGRESS NOTE   INTERVAL HISTORY BP < 160 overnight, 74/42 at one point, was given 573m NS bolus. Also noted to have oliguria with bladder scan 1542mand was given LR 50070molus. EVD with 63m55mernight. Na 148 this AM. WBC 10.1. Afebrile. Seen by PT/OT yesterday. Attempted angiogram yesterday however was aborted and was nondiagnostic and limited by patient movement. Received scheduled seroquel and did not require precedex overnight.   Patient neurological exam unchanged. Continues to be alert but with fluent aphasia. Per NSGY, repeat angiogram planned for later time when mental status improves or will consider angio with anesthesia.   Vitals:   04/05/22 0800 04/05/22 0900 04/05/22 1000 04/05/22 1200  BP: (!) 120/99 128/61 136/74 (!) 113/49  Pulse: 97 97 96 96  Resp: (!) 21 18 (!) 21 14  Temp: (!) 101.7 F (38.7 C)     TempSrc: Axillary     SpO2: 96% 94% 95% 93%  Weight:      Height:       CBC:  Recent Labs  Lab 03/29/22 1533 03/31/22 0608 04/04/22 0625 04/05/22 0707  WBC 9.3   < > 10.0 10.1  NEUTROABS 5.5  --   --  6.4  HGB 14.5   < > 13.2 13.3  HCT 44.8   < > 40.4 39.4  MCV 91.6   < > 91.6 91.4  PLT 207   < > 180 190   < > = values in this interval not displayed.   Basic Metabolic Panel:  Recent Labs  Lab 04/02/22 0602 04/02/22 1200 04/04/22 0625 04/05/22 0707  NA 153*   < > 150* 148*  K 3.3*   < > 3.3* 3.7  CL 121*   < > 116* 117*  CO2 27   < > 25 25  GLUCOSE 141*   < > 110* 134*  BUN 26*   < > 24* 33*  CREATININE 0.43*   < > 0.56 0.67  CALCIUM 8.7*   < > 8.9 8.8*  MG 2.1  --   --  2.3  PHOS 3.4  --   --   --    < > = values in this interval not displayed.   Lipid Panel:  Recent Labs  Lab 03/29/22 2040  CHOL 190  TRIG 28  HDL 65  CHOLHDL 2.9  VLDL 6  LDLCALC 119*   HgbA1c:  Recent Labs  Lab 03/29/22 2040  HGBA1C 5.7*   Urine Drug Screen:  Recent Labs  Lab 03/29/22 1654  LABOPIA NONE DETECTED  COCAINSCRNUR NONE DETECTED   LABBENZ NONE DETECTED  AMPHETMU NONE DETECTED  THCU NONE DETECTED  LABBARB NONE DETECTED    Alcohol Level  Recent Labs  Lab 03/29/22 1533  ETH <10    IMAGING past 24 hours No results found.  PHYSICAL EXAM 65 y66female lying in bed with EVD in place. She is alert and oriented to self. Not oriented to situation, time, place. Nonfluent speech with mixed receptive and expressive aphasia.  She is spontaneously moving all extremities with left mitten restraint in place. Right LE movement appears slightly decreased compared to left.  Not able to follow simple commands.  Gaze preference to left but able to cross midline to right. Limited vertical gaze. Cranial nerves: PERRL. Moves all 4 extremities without focal weakness.    ASSESSMENT/PLAN 65 y34r old female with history of COPD, smoker admitted for headache, nausea, vomiting, found down in bathroom with decreased level consciousness.  CT showed large  left frontal temporal ICH, with IVH and mild hydrocephalus and a 5 mm midline shift.  CT repeat showed mild increase in size. NSGY consulted. Dr. Arnoldo Morale on board. Status post EVD. MRI with and without contrast showed 68 cc ICH, with IVH and midline shift 64m.  Repeat CT showed stable hematoma but midline shift up to 1 cm.  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  CTV no this thrombosis.  EF 60 to 65%, LDL 119, A1c 5.7, UDS negative.  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  Impending left uncal herniation.  Midline shift 5 mm Repeat HCT 2 hours after Interval increase in size of Left IPH. New IPH within the left uncus/medial temporal lobe.  MRI  03/29/22 - 68 mL intraparenchymal hemorrhage centered at the left temporal lobe, similar in size as compared to prior CT. midline shift 7 mm CT repeat 03/30/22 stable hematoma but increased midline shift to 1 cm CTA  Head and Neck Cluster of abnormal, tortuous vessels along the lateral aspect of the left temporal lobe compatible with an AVF for AVM.  CT venogram negative for venous thrombosis Repeat HCT 8/12 showed Stable large left temporal lobe hemorrhage and edema since yesterday. Stable intraventricular hemorrhage and EVD along with mild to moderate enlargement of the left temporal lobe.Stable intracranial mass effect with up to 9 mm rightward midline shift. Basilar cisterns remain patent. No new intracranial abnormality 2D Echo EF 60-65%, No shunt or thrombus detected LDL 119 HgbA1c 5.7 VTE prophylaxis - Heparin No antiplatelet or anticoagulants prior to admission per chart review, now on no antithrombotics due to IBennett Therapy recommendations: AIR.  Disposition:  TBD  Cerebral Edema Obstructive hydrocephalus 8/10: started 3% HTS  8/12: stopped 3% HTS Na 148 this AM EVD placed. NSG is following. Per NSGY, tentative plan to remove ventriculostomy in next day or 2.  Pop-off increased yesterday Continue frequent neuro checks per ICU protocol CCM following, appreciate their help.  Agitated delirium Was previously on low-dose precedex.  Patient received ativan, restarted on low dose precedex yesterday. Discontinued ativan order. Recommend minimizing benzo use. Administer seroquel earlier (changed from 11pm->8pm).   Left temporal AVF/ AVM Neurosurgery following CTA more clearly showing this as likely source of hemorrhage Per NSGY, angiogram attempted 8/16 however limited by patient movement. Will repeat angiogram.  Dr. SLeonie Mandiscussed with neurosurgery. Plan for angiogram and definitive treatment of AVM by Dr. NKathyrn Sheriff  HTN No hx of hypertension or home BP meds prior to admission Unstable  Cleviprex drip off. Now on norvasc '10mg'$ , lisinopril '20mg'$  Systolic BP <<938 Long term BP goal normotensive.  Hyperlipidemia Home meds: None  LDL 119, goal < 70 High intensity statin to be considered  at discharge in setting of hemorrhagic stroke   Dysphagia Started tube feeding per RD recs Core track placed On IVF, wean off on feeding started and tolerated   Elimination Foley catheter in place, high risk for urinary retention due to AMS Had bladder scan with 1546movernight, given LR 50076molus Consider bowel regimen when appropriate  Tobacco abuse Current smoker 1PPD, 30 pack year hx Smoking cessation counseling will be provided  Other Stroke Risk Factors Advanced Age >/= 65 39urrent ETOH use, alcohol level <10, will be advised to drink no more than 1 drink a day Incidentally noted previous cerebellar stroke   Hypokalemia Potassium  repleted. Continue to monitor   Other Active Problems COPD/asthma  Hospital day # 7  Rolanda Lundborg, MD, PGY-1 04/05/2022 1:10 PM  I have personally obtained history,examined this patient, reviewed notes, independently viewed imaging studies, participated in medical decision making and plan of care.ROS completed by me personally and pertinent positives fully documented  I have made any additions or clarifications directly to the above note. Agree with note above.  Patient remains aphasic and intermittently agitated and was not cooperative yesterday for diagnostic cerebral catheter angiogram which now will be rescheduled later when she is more cooperative as per neurosurgery.  Ventriculostomy is draining well and she is tolerating Popoff of 20 hopefully will be DC'd soon.  Plan to repeat CT head tomorrow morning per neurosurgery.  No family available at the bedside for discussion today.  Discussed with Dr. Arnoldo Morale neurosurgery.This patient is critically ill and at significant risk of neurological worsening, death and care requires constant monitoring of vital signs, hemodynamics,respiratory and cardiac monitoring, extensive review of multiple databases, frequent neurological assessment, discussion with family, other specialists and medical decision  making of high complexity.I have made any additions or clarifications directly to the above note.This critical care time does not reflect procedure time, or teaching time or supervisory time of PA/NP/Med Resident etc but could involve care discussion time.  I spent 30 minutes of neurocritical care time  in the care of  this patient.      Antony Contras, MD Medical Director [facility] Pager: [phone] 04/05/2022 2:02 PM   To contact Stroke Continuity provider, please refer to [email or web address] After hours, contact General Neurology

## 2022-04-05 NOTE — Progress Notes (Signed)
Physical Therapy Treatment Patient Details Name: [name] Giles MRN: [id number] DOB: [birth year] Today's Date: 04/05/2022   History of Present Illness [name] Giles is a 66 y.o. female presenting 60/10 after being found on the bathroom at work, head CT revealed a large left frontotemporal ICH with IVH and 5 mm midline shift and ballooning of the temporal horns concerning for obstructive hydrocephalus s/p placement of right frontal ventriculostomy via bur hole 8/10. PHMx: COPD, tobacco abuse and asthma.    PT Comments    The pt presents with lethargy, but alertness does improve when transitioned to sitting EOB. From this position the pt was able to make balance corrections, follows ~50-75% of commands and was attempting to answer questions. However, the pt was unable to answer orientation questions accurately (stating yes to most questions or not answering open-ended questions clearly) and correctly identified guest as her sister but was unable to state sister's name. The pt completed x4 sit-stands needing progressively increased assist from minA to [place], and completed x3 lateral bouts of walking along EOB with modA to steady and facilitate steps. Will benefit from continued skilled PT to progress endurance and strength as well as command following and awareness.    Recommendations for follow up therapy are one component of a multi-disciplinary discharge planning process, led by the attending physician.  Recommendations may be updated based on patient status, additional functional criteria and insurance authorization.  Follow Up Recommendations  Acute inpatient rehab (3hours/day)     Assistance Recommended at Discharge Frequent or constant Supervision/Assistance  Patient can return home with the following Two people to help with walking and/or transfers;A lot of help with bathing/dressing/bathroom;Assistance with feeding;Assistance with cooking/housework;Direct supervision/assist for medications  management;Direct supervision/assist for financial management;Assist for transportation;Help with stairs or ramp for entrance   Equipment Recommendations  Other (comment) (TBD)    Recommendations for Other Services       Precautions / Restrictions Precautions Precautions: Fall Precaution Comments: cortrak, EVD, wrist restraints, mittens Restrictions Weight Bearing Restrictions: No     Mobility  Bed Mobility Overal bed mobility: Needs Assistance Bed Mobility: Supine to Sit, Sit to Supine     Supine to sit: Mod assist Sit to supine: Min guard   General bed mobility comments: modA due to lethargy initially, but then able to return to bed without assist    Transfers Overall transfer level: Needs assistance Equipment used: 1 person hand held assist Transfers: Sit to/from Stand Sit to Stand: Mod assist, Min assist           General transfer comment: minA initially to rise from seated position, then modA with fatigue. no buckling in either knee, posterior lean progressed with fatigue. x4 in session    Ambulation/Gait Ambulation/Gait assistance: Mod assist Gait Distance (Feet): 4 Feet (x3) Assistive device: 1 person hand held assist Gait Pattern/deviations: Decreased stride length, Decreased step length - left, Step-to pattern, Shuffle Gait velocity: decreased     General Gait Details: x3 bouts of lateral stepping along EOB x2 to L and x1 to R. modA to steady with cues for stepping and assist to facilitate wt shift    Modified Rankin (Stroke Patients Only) Modified Rankin (Stroke Patients Only) Pre-Morbid Rankin Score: No symptoms Modified Rankin: Moderately severe disability     Balance Overall balance assessment: Needs assistance Sitting-balance support: Feet supported, No upper extremity supported Sitting balance-Leahy Scale: Fair Sitting balance - Comments: fair for static sitting. With dynamic task, patient required minA as she began to demo R  lateral  lean   Standing balance support: Bilateral upper extremity supported Standing balance-Leahy Scale: Poor Standing balance comment: minA to modA to maintain static standing                            Cognition Arousal/Alertness: Awake/alert Behavior During Therapy: Flat affect Overall Cognitive Status: Impaired/Different from baseline Area of Impairment: Orientation, Attention, Memory, Following commands, Safety/judgement, Awareness, Problem solving                 Orientation Level: Disoriented to, Place, Time, Situation Current Attention Level: Focused Memory: Decreased short-term memory Following Commands: Follows one step commands inconsistently Safety/Judgement: Decreased awareness of safety, Decreased awareness of deficits Awareness: Intellectual Problem Solving: Slow processing, Decreased initiation, Difficulty sequencing General Comments: pt lethargic initially, but awakes with change to sitting EOB, oriented to self but unable to answer other orientation questions accurately (stating yes to most questions or not answering open-ended questions clearly) able to follow simple commands with increased time and effort        Exercises      General Comments General comments (skin integrity, edema, etc.): soft BP in sitting with pt reporting fatigue but no dizziness. (low of 81/32, 96/53; high of 107/42 and 107/50 in supine)      Pertinent Vitals/Pain Pain Assessment Pain Assessment: No/denies pain Faces Pain Scale: No hurt Pain Intervention(s): Monitored during session     PT Goals (current goals can now be found in the care plan section) Acute Rehab PT Goals Patient Stated Goal: none stated PT Goal Formulation: Patient unable to participate in goal setting Time For Goal Achievement: 04/16/22 Potential to Achieve Goals: Good Progress towards PT goals: Progressing toward goals    Frequency    Min 4X/week      PT Plan Current plan remains  appropriate       AM-PAC PT "6 Clicks" Mobility   Outcome Measure  Help needed turning from your back to your side while in a flat bed without using bedrails?: A Lot Help needed moving from lying on your back to sitting on the side of a flat bed without using bedrails?: A Little Help needed moving to and from a bed to a chair (including a wheelchair)?: A Lot Help needed standing up from a chair using your arms (e.g., wheelchair or bedside chair)?: A Lot Help needed to walk in hospital room?: Total Help needed climbing 3-5 steps with a railing? : Total 6 Click Score: 11    End of Session Equipment Utilized During Treatment: Gait belt Activity Tolerance: Patient tolerated treatment well Patient left: in bed;with call bell/phone within reach;with family/visitor present Nurse Communication: Mobility status PT Visit Diagnosis: Unsteadiness on feet (R26.81);Other abnormalities of gait and mobility (R26.89);Repeated falls (R29.6);Muscle weakness (generalized) (M62.81)     Time: 9924-2683 PT Time Calculation (min) (ACUTE ONLY): 25 min  Charges:  $Therapeutic Exercise: 8-22 mins $Therapeutic Activity: 8-22 mins                     West Carbo, PT, DPT   Acute Rehabilitation Department   Sandra Cockayne 04/05/2022, 12:38 PM

## 2022-04-06 ENCOUNTER — Inpatient Hospital Stay (HOSPITAL_COMMUNITY): Payer: 59

## 2022-04-06 DIAGNOSIS — I611 Nontraumatic intracerebral hemorrhage in hemisphere, cortical: Secondary | ICD-10-CM | POA: Diagnosis not present

## 2022-04-06 LAB — CBC
HCT: 37.4 % (ref 36.0–46.0)
Hemoglobin: 12.2 g/dL (ref 12.0–15.0)
MCH: 30 pg (ref 26.0–34.0)
MCHC: 32.6 g/dL (ref 30.0–36.0)
MCV: 91.9 fL (ref 80.0–100.0)
Platelets: 178 10*3/uL (ref 150–400)
RBC: 4.07 MIL/uL (ref 3.87–5.11)
RDW: 14.1 % (ref 11.5–15.5)
WBC: 13.6 10*3/uL — ABNORMAL HIGH (ref 4.0–10.5)
nRBC: 0 % (ref 0.0–0.2)

## 2022-04-06 LAB — GLUCOSE, CAPILLARY: Glucose-Capillary: 142 mg/dL — ABNORMAL HIGH (ref 70–99)

## 2022-04-06 NOTE — Progress Notes (Signed)
Occupational Therapy Treatment Patient Details Name: [name] Giles MRN: [id number] DOB: 1955-09-30 Today's Date: 04/06/2022   History of present illness [name] Giles is a 66 y.o. female presenting 8/10 after being found on the bathroom at work, head CT revealed a large left frontotemporal ICH with IVH and 5 mm midline shift and ballooning of the temporal horns concerning for obstructive hydrocephalus s/p placement of right frontal ventriculostomy via bur hole 8/10. EVD removed 8/18. PHMx: COPD, tobacco abuse and asthma.   OT comments  Pt making steady progress toward goals with ability to mobilize to the bathroom for toileting and to complete grooming tasks at sink level with moderate cues for initiation and sequencing. Posterior bias noted when pt distracted. Sister present for session and pleased with [name] Giles's progress. Continue to recommend intensive rehab at [facility] to facilitate safe DC home with assistance. VSS. Will follow.    Recommendations for follow up therapy are one component of a multi-disciplinary discharge planning process, led by the attending physician.  Recommendations may be updated based on patient status, additional functional criteria and insurance authorization.    Follow Up Recommendations  Acute inpatient rehab (3hours/day)    Assistance Recommended at Discharge Frequent or constant Supervision/Assistance  Patient can return home with the following  A lot of help with walking and/or transfers;A lot of help with bathing/dressing/bathroom;Assistance with cooking/housework;Direct supervision/assist for medications management;Direct supervision/assist for financial management;Assist for transportation;Help with stairs or ramp for entrance   Equipment Recommendations  BSC/3in1    Recommendations for Other Services Rehab consult    Precautions / Restrictions Precautions Precautions: Fall Precaution Comments: cortrak, wrist restraints, mittens, posey  belt Restrictions Weight Bearing Restrictions: No       Mobility Bed Mobility Overal bed mobility: Needs Assistance Bed Mobility: Supine to Sit     Supine to sit: Min guard     General bed mobility comments: minG to complete with increased time and line management    Transfers Overall transfer level: Needs assistance Equipment used: 1 person hand held assist Transfers: Sit to/from Stand Sit to Stand: Min assist           General transfer comment: minA to rise from EOB and low toilet. pt with posterior LOB with distraction of additional task (brushing teeth, wiping, self-care)     Balance Overall balance assessment: Needs assistance Sitting-balance support: Feet supported, No upper extremity supported Sitting balance-Leahy Scale: Fair Sitting balance - Comments: fair for static sitting. With dynamic task, patient required minA as she began to demo R lateral lean   Standing balance support: Bilateral upper extremity supported Standing balance-Leahy Scale: Poor Standing balance comment: minA to modA to maintain static standing (posterior bias when distracted)                           ADL either performed or assessed with clinical judgement   ADL Overall ADL's : Needs assistance/impaired     Grooming: Minimal assistance   Upper Body Bathing: Minimal assistance   Lower Body Bathing: Moderate assistance;Sit to/from stand   Upper Body Dressing : Moderate assistance   Lower Body Dressing: Moderate assistance   Toilet Transfer: Minimal assistance;+2 for physical assistance;Cueing for safety;Cueing for sequencing (HHA)   Toileting- Clothing Manipulation and Hygiene: Moderate assistance Toileting - Clothing Manipulation Details (indicate cue type and reason): nsg reports incontinenece of BM earlier this am     Functional mobility during ADLs: Moderate assistance      Extremity/Trunk  Assessment Upper Extremity Assessment Upper Extremity Assessment:  Generalized weakness   Lower Extremity Assessment Lower Extremity Assessment: Defer to PT evaluation        Vision   Vision Assessment?: Vision impaired- to be further tested in functional context   Perception Perception Perception: Impaired   Praxis Praxis Praxis: Impaired Praxis Impairment Details: Initiation;Motor planning    Cognition Arousal/Alertness: Awake/alert Behavior During Therapy: Flat affect, Impulsive Overall Cognitive Status: Impaired/Different from baseline Area of Impairment: Orientation, Attention, Memory, Following commands, Safety/judgement, Awareness, Problem solving                 Orientation Level: Disoriented to, Place, Time, Situation Current Attention Level: Sustained Memory: Decreased short-term memory Following Commands: Follows one step commands inconsistently Safety/Judgement: Decreased awareness of safety, Decreased awareness of deficits Awareness: Intellectual Problem Solving: Slow processing, Decreased initiation, Difficulty sequencing General Comments: pt able to follow simple cues, answering two option questions accurately ("is your sister's name [name] Giles or [name] Giles?"). Unaware that her responses do not make sense; dificulty with sequencing - brushing teeth without tooth paste; putting her sock on her hand; able to compeklte tasks once initated appropriately        Exercises      Shoulder Instructions       General Comments SpO2 to 89% on RA at rest, replaced on O2 at end of session. BP soft but stable, RN alerted    Pertinent Vitals/ Pain       Pain Assessment Pain Assessment: No/denies pain Faces Pain Scale: No hurt  Home Living                                          Prior Functioning/Environment              Frequency  Min 2X/week        Progress Toward Goals  OT Goals(current goals can now be found in the care plan section)  Progress towards OT goals: Progressing toward goals  Acute  Rehab OT Goals Patient Stated Goal: per sister for [name] Giles to get better OT Goal Formulation: With patient Time For Goal Achievement: 04/16/22 Potential to Achieve Goals: Good ADL Goals Pt Will Perform Grooming: with min guard assist;standing Pt Will Perform Upper Body Dressing: with supervision;sitting Pt Will Perform Lower Body Dressing: with min assist;sit to/from stand Pt Will Transfer to Toilet: with min guard assist;ambulating;regular height toilet  Plan Discharge plan remains appropriate    Co-evaluation    PT/OT/SLP Co-Evaluation/Treatment: Yes Reason for Co-Treatment: Complexity of the patient's impairments (multi-system involvement);Necessary to address cognition/behavior during functional activity;To address functional/ADL transfers   OT goals addressed during session: ADL's and self-care      AM-PAC OT "6 Clicks" Daily Activity     Outcome Measure   Help from another person eating meals?: A Lot Help from another person taking care of personal grooming?: A Little Help from another person toileting, which includes using toliet, bedpan, or urinal?: A Lot Help from another person bathing (including washing, rinsing, drying)?: A Lot Help from another person to put on and taking off regular upper body clothing?: A Lot Help from another person to put on and taking off regular lower body clothing?: A Lot 6 Click Score: 13    End of Session Equipment Utilized During Treatment: Gait belt  OT Visit Diagnosis: Unsteadiness on feet (R26.81);Other abnormalities of gait and mobility (R26.89);Muscle weakness (generalized) (  M62.81);Other symptoms and signs involving the nervous system (R29.898);Other symptoms and signs involving cognitive function   Activity Tolerance Patient tolerated treatment well   Patient Left in chair;with call bell/phone within reach;with chair alarm set;with family/visitor present;with restraints reapplied (posey boet)   Nurse Communication Mobility  status        Time: 5427-0623 OT Time Calculation (min): 33 min  Charges: OT General Charges $OT Visit: 1 Visit OT Treatments $Self Care/Home Management : 8-22 mins  Maurie Boettcher, OT/L   Acute OT Clinical Specialist [place] Pager [phone] Office [phone]   [facility] 04/06/2022, 2:45 PM

## 2022-04-06 NOTE — Progress Notes (Signed)
Modified Barium Swallow Progress Note  Patient Details  Name: [name] Giles MRN: [id number] Date of Birth: 1956/02/14  Today's Date: 04/06/2022  Modified Barium Swallow completed.  Full report located under Chart Review in the Imaging Section.  Brief recommendations include the following:  Clinical Impression  Pt's oropharyngeal swallow mechanism is within functional limits. Decreased bolus cohesion was noted once when pt filled her oral cavity with 4oz of thin liquids; premature spillage was noted thereafter with transient penetration (PAS 2). However this depth of laryngeal invasion is considered to be WNL and this was not replicated with smaller boluses. Coughing was observed during the study as was noted at bedside; however, no instances of dysfunctional penetration or aspiration were demonstrated at these times and coughing is therefore believed to be unrelated to swallowing. Pt was able to swallow a 73m barium tablet with thin liquids. Additional boluses of thin liquids were necesssary to facilitate trasport through the cervical and upper thoracic esophagus, but SLP suspects that the presence of the Cortrak impacted this. A regular texture diet with thin liquids is recommended at this time and SLP will follow briefly for swallowing to ensure tolerance.   Swallow Evaluation Recommendations       SLP Diet Recommendations: Regular solids;Thin liquid   Liquid Administration via: Cup;Straw   Medication Administration: Whole meds with liquid   Supervision: Patient able to self feed;Intermittent supervision to cue for compensatory strategies   Compensations: Minimize environmental distractions;Slow rate   Postural Changes: Seated upright at 90 degrees   Oral Care Recommendations: Oral care BID      Alecxander Mainwaring I. PHardin Negus MLeota CTonyOffice number [phone] SHorton Marshall8/18/2023,2:45 PM

## 2022-04-06 NOTE — Progress Notes (Signed)
Physical Therapy Treatment Patient Details Name: [name] Giles MRN: [id number] DOB: September 01, 1955 Today's Date: 04/06/2022   History of Present Illness [name] Giles is a 66 y.o. female presenting 104/10 after being found on the bathroom at work, head CT revealed a large left frontotemporal ICH with IVH and 5 mm midline shift and ballooning of the temporal horns concerning for obstructive hydrocephalus s/p placement of right frontal ventriculostomy via bur hole 8/10. PHMx: COPD, tobacco abuse and asthma.    PT Comments    The pt was seen for continued mobility progression and presents with improved arousal and alertness today. The pt was able to make great improvements in ambulation distance at this time, but continues to need up to [facility] of 2 due to poor dynamic stability, awareness, and need for continued cues for wayfinding and directions.  The pt also requires increased assistance with any distraction or task such as self-care and required increased assist to correct posterior LOB with this challenge. Continue to recommend acute inpatient rehab when medically stable for d/c.    Recommendations for follow up therapy are one component of a multi-disciplinary discharge planning process, led by the attending physician.  Recommendations may be updated based on patient status, additional functional criteria and insurance authorization.  Follow Up Recommendations  Acute inpatient rehab (3hours/day)     Assistance Recommended at Discharge Frequent or constant Supervision/Assistance  Patient can return home with the following Two people to help with walking and/or transfers;A lot of help with bathing/dressing/bathroom;Assistance with feeding;Assistance with cooking/housework;Direct supervision/assist for medications management;Direct supervision/assist for financial management;Assist for transportation;Help with stairs or ramp for entrance   Equipment Recommendations  Other (comment) (TBD)     Recommendations for Other Services       Precautions / Restrictions Precautions Precautions: Fall Precaution Comments: cortrak, wrist restraints, mittens, posey belt Restrictions Weight Bearing Restrictions: No     Mobility  Bed Mobility Overal bed mobility: Needs Assistance Bed Mobility: Supine to Sit     Supine to sit: Min guard     General bed mobility comments: minG to complete with increased time and line management    Transfers Overall transfer level: Needs assistance Equipment used: 1 person hand held assist Transfers: Sit to/from Stand Sit to Stand: Min assist           General transfer comment: minA to rise from EOB and low toilet. pt with posterior LOB with distraction of additional task (brushing teeth, wiping, self-care)    Ambulation/Gait Ambulation/Gait assistance: Mod assist, +2 physical assistance Gait Distance (Feet): 75 Feet (15 + 15 ft + 45 ft) Assistive device: 2 person hand held assist Gait Pattern/deviations: Decreased stride length, Decreased step length - left, Step-to pattern, Shuffle, Narrow base of support Gait velocity: decreased     General Gait Details: small steps with frequent lateral drifting and LOB needing BUE support and up to modA of 2. cues for directions and wayfinding    Modified Rankin (Stroke Patients Only) Modified Rankin (Stroke Patients Only) Pre-Morbid Rankin Score: No symptoms Modified Rankin: Moderately severe disability     Balance Overall balance assessment: Needs assistance Sitting-balance support: Feet supported, No upper extremity supported Sitting balance-Leahy Scale: Fair Sitting balance - Comments: fair for static sitting. With dynamic task, patient required minA as she began to demo R lateral lean   Standing balance support: Bilateral upper extremity supported Standing balance-Leahy Scale: Poor Standing balance comment: minA to modA to maintain static standing  Cognition Arousal/Alertness: Awake/alert Behavior During Therapy: Flat affect Overall Cognitive Status: Impaired/Different from baseline Area of Impairment: Orientation, Attention, Memory, Following commands, Safety/judgement, Awareness, Problem solving                 Orientation Level: Disoriented to, Place, Time, Situation Current Attention Level: Focused Memory: Decreased short-term memory Following Commands: Follows one step commands inconsistently Safety/Judgement: Decreased awareness of safety, Decreased awareness of deficits Awareness: Intellectual Problem Solving: Slow processing, Decreased initiation, Difficulty sequencing General Comments: pt able to follow simple cues, answering two option questions accurately ("is your sister's name Juliann Pulse or Langley Gauss?") but unable to answer open-ended questions, read numbers from room signs, or repeat back instructions to therapists at this time        Exercises      General Comments General comments (skin integrity, edema, etc.): SpO2 to 89% on RA at rest, replaced on O2 at end of session. BP soft but stable, RN alerted      Pertinent Vitals/Pain Pain Assessment Pain Assessment: No/denies pain Faces Pain Scale: No hurt     PT Goals (current goals can now be found in the care plan section) Acute Rehab PT Goals Patient Stated Goal: none stated PT Goal Formulation: Patient unable to participate in goal setting Time For Goal Achievement: 04/16/22 Potential to Achieve Goals: Good Progress towards PT goals: Progressing toward goals    Frequency    Min 4X/week      PT Plan Current plan remains appropriate    Co-evaluation PT/OT/SLP Co-Evaluation/Treatment: Yes Reason for Co-Treatment: Complexity of the patient's impairments (multi-system involvement);Necessary to address cognition/behavior during functional activity;For patient/therapist safety          AM-PAC PT "6 Clicks" Mobility   Outcome Measure  Help  needed turning from your back to your side while in a flat bed without using bedrails?: A Lot Help needed moving from lying on your back to sitting on the side of a flat bed without using bedrails?: A Little Help needed moving to and from a bed to a chair (including a wheelchair)?: A Lot Help needed standing up from a chair using your arms (e.g., wheelchair or bedside chair)?: A Lot Help needed to walk in hospital room?: A Lot Help needed climbing 3-5 steps with a railing? : Total 6 Click Score: 12    End of Session Equipment Utilized During Treatment: Gait belt Activity Tolerance: Patient tolerated treatment well Patient left: in chair;with call bell/phone within reach;with chair alarm set;with family/visitor present Nurse Communication: Mobility status PT Visit Diagnosis: Unsteadiness on feet (R26.81);Other abnormalities of gait and mobility (R26.89);Repeated falls (R29.6);Muscle weakness (generalized) (M62.81)     Time: 3662-9476 PT Time Calculation (min) (ACUTE ONLY): 31 min  Charges:  $Therapeutic Activity: 8-22 mins                     West Carbo, PT, DPT   Acute Rehabilitation Department   Sandra Cockayne 04/06/2022, 12:14 PM

## 2022-04-06 NOTE — Progress Notes (Addendum)
Nutrition Follow-up  DOCUMENTATION CODES:   Not applicable  INTERVENTION:   Tube feeding via Cortrak tube: Omsolite 1.5 at 50 ml/h (1200 ml per day) Prosource TF20 60 ml daily  Provides 1880 kcal, 95 gm protein, 912 ml free water daily   NUTRITION DIAGNOSIS:   Inadequate oral intake related to inability to eat as evidenced by NPO status. Ongoing.   GOAL:   Patient will meet greater than or equal to 90% of their needs Met with TF at goal   MONITOR:   TF tolerance  REASON FOR ASSESSMENT:    (cortrak tube)    ASSESSMENT:   Pt with PMH of COPD, current every day smoker admitted with ICH.   Pt discussed during ICU rounds and with RN and MD.  Per neurosurgery pt with AVM/AV fistula and will need arteriogram.  SLP following, continue NPO for now but is allowed ice chips. Pt is awake but confused; per PT and SLP falls asleep quickly during therapy.   8/11 cortrak placed; tip gastric 8/16 cortrak replaced after being removed by pt 8/15; tip in stomach  8/18 EVD removed   Medications reviewed and include: decadron, senokot-s  Precedex   Labs reviewed: Na 150    Diet Order:   Diet Order     None       EDUCATION NEEDS:   No education needs have been identified at this time  Skin:  Skin Assessment: Reviewed RN Assessment  Last BM:  8/17 x 3 type 6  Height:   Ht Readings from Last 1 Encounters:  03/29/22 5' 5"  (1.651 m)    Weight:   Wt Readings from Last 1 Encounters:  03/29/22 66.2 kg    BMI:  Body mass index is 24.29 kg/m.  Estimated Nutritional Needs:   Kcal:  1800-2000  Protein:  90-100 grams  Fluid:  >1.8 L/day  Lockie Pares., RD, LDN, CNSC See AMiON for contact information

## 2022-04-06 NOTE — Progress Notes (Addendum)
STROKE TEAM PROGRESS NOTE   INTERVAL HISTORY BP < 160 overnight without hypotensive events. Discontinued lisinopril '20mg'$  yesterday and held amlodipine this AM. Afebrile this AM, T 100.9 overnight. WBC 13.6. VP drained 46cc overnight. Ventriculostomy removed this AM given expected evolution of L temporal hemorrhage without hydrocephalus. SLP noted pt to have neologistic errors and phonemic paraphasias.   Patient greets this Probation officer upon entering room. Is able to say "I'm doing great." She continues to have fluent aphasia, difficulty with comprehension. When asked where she is, she talks about sending emails and consignment. Will intermittently follow commands. MBS at 1:30pm today.  8/18 CT head: 1. Large left temporal lobe intra-axial hematoma has not regressed since 03/31/2022 and now appears slightly larger. Regional edema and rightward midline shift not significantly changed. Basilar cisterns remain patent. 2. Stable right frontal EVD and moderate volume IVH in the left lateral ventricle. Blood has cleared the 3rd and 4th ventricles since 03/31/2022. 3. No new intracranial abnormality.   Vitals:   04/06/22 0400 04/06/22 0500 04/06/22 0600 04/06/22 0700  BP: (!) 119/51 (!) 111/56 (!) 131/59 (!) 119/57  Pulse: 70 76 79 79  Resp: '15 13 13 16  '$ Temp: 98.8 F (37.1 C)     TempSrc: Axillary     SpO2: 97% 94% 97% 95%  Weight:      Height:       CBC:  Recent Labs  Lab 04/05/22 0707 04/06/22 0234  WBC 10.1 13.6*  NEUTROABS 6.4  --   HGB 13.3 12.2  HCT 39.4 37.4  MCV 91.4 91.9  PLT 190 376   Basic Metabolic Panel:  Recent Labs  Lab 04/02/22 0602 04/02/22 1200 04/05/22 0707 04/05/22 1646  NA 153*   < > 148* 150*  K 3.3*   < > 3.7 3.8  CL 121*   < > 117* 115*  CO2 27   < > 25 28  GLUCOSE 141*   < > 134* 162*  BUN 26*   < > 33* 39*  CREATININE 0.43*   < > 0.67 0.81  CALCIUM 8.7*   < > 8.8* 9.4  MG 2.1  --  2.3  --   PHOS 3.4  --   --   --    < > = values in this interval not  displayed.   Lipid Panel:  No results for input(s): "CHOL", "TRIG", "HDL", "CHOLHDL", "VLDL", "LDLCALC" in the last 168 hours.  HgbA1c:  No results for input(s): "HGBA1C" in the last 168 hours.  Urine Drug Screen:  No results for input(s): "LABOPIA", "COCAINSCRNUR", "LABBENZ", "AMPHETMU", "THCU", "LABBARB" in the last 168 hours.   Alcohol Level  No results for input(s): "ETH" in the last 168 hours.   IMAGING past 24 hours CT HEAD WO CONTRAST (5MM)  Result Date: 04/06/2022 CLINICAL DATA:  66 year old female presenting with left temporal lobe hemorrhage, IVH and EVD. Subsequent encounter. EXAM: CT HEAD WITHOUT CONTRAST TECHNIQUE: Contiguous axial images were obtained from the base of the skull through the vertex without intravenous contrast. RADIATION DOSE REDUCTION: This exam was performed according to the departmental dose-optimization program which includes automated exposure control, adjustment of the mA and/or kV according to patient size and/or use of iterative reconstruction technique. COMPARISON:  03/31/2022 head CT and earlier. FINDINGS: Brain: Ongoing hyperdense intra-axial hemorrhage in the left hemisphere throughout the temporal lobe. Hematoma size now 66 mm x 47 mm (including mesial temporal lobe blood) by 51 mm (AP by transverse by CC) estimated blood products now up  to 79 mL, mildly progressed from prior exams. Ongoing regional edema, with perhaps greater extension into the posterior deep white matter capsules now on series 3, image 16. Rightward midline shift of 10 mm is 1 mm greater since 03/31/2022. Basilar cisterns remain patent. Stable right frontal approach EVD which terminates at the septum pellucidum and does communicate with the right lateral ventricle on coronal image 29. Moderate volume intraventricular hemorrhage mostly in the left lateral ventricle now. Clearance of blood from the 3rd and 4th ventricles. No other extra-axial extension of blood. No cortically based acute  infarct identified. Vascular: Calcified atherosclerosis at the skull base. Skull: Stable right frontal burr hole. No acute osseous abnormality identified. Sinuses/Orbits: Left nasoenteric tube remains in place. Stable sinus and mastoid aeration. Other: Minimal postsurgical changes to the superior right scalp. Mild rightward gaze but otherwise orbits appear negative. IMPRESSION: 1. Large left temporal lobe intra-axial hematoma has not regressed since 03/31/2022 and now appears slightly larger. Regional edema and rightward midline shift not significantly changed. Basilar cisterns remain patent. 2. Stable right frontal EVD and moderate volume IVH in the left lateral ventricle. Blood has cleared the 3rd and 4th ventricles since 03/31/2022. 3. No new intracranial abnormality. Electronically Signed   By: Genevie Ann M.D.   On: 04/06/2022 04:59    PHYSICAL EXAM 66 yo female lying in bed with EVD in place. She is alert and oriented to self. Not oriented to situation, time, place. Nonfluent speech with mixed receptive and expressive aphasia. Able to engage in social conversation but unable to comprehend detailed questions. Talks about "emails" and "consignments."  She intermittently follows commands and lifts BUE and BLE against gravity. RUE 4/5 strength compared to LUE.  Lateral gaze intact. Limited vertical gaze. Cranial nerves: PERRL, tongue midline, face symmetric at rest and smiling.  Sensation intact throughout.    ASSESSMENT/PLAN 66 year old female with history of COPD, smoker admitted for headache, nausea, vomiting, found down in bathroom with decreased level consciousness.  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  CT repeat showed mild increase in size. NSGY consulted. Dr. Arnoldo Morale on board. Status post EVD. MRI with and without contrast showed 68 cc ICH, with IVH and midline shift 38m.  Repeat CT showed stable hematoma but midline shift up to 1 cm.   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  CTV no this thrombosis.  EF 60 to 65%, LDL 119, A1c 5.7, UDS negative.  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  Impending left uncal herniation.  Midline shift 5 mm Repeat HCT 2 hours after Interval increase in size of Left IPH. New IPH within the left uncus/medial temporal lobe.  MRI  03/29/22 - 68 mL intraparenchymal hemorrhage centered at the left temporal lobe, similar in size as compared to prior CT. midline shift 7 mm CT repeat 03/30/22 stable hematoma but increased midline shift to 1 cm CTA Head and Neck Cluster of abnormal, tortuous vessels along the lateral aspect of the left temporal lobe compatible with an AVF for AVM.  CT venogram negative for venous thrombosis Repeat HCT 8/12 showed Stable large left temporal lobe hemorrhage and edema since yesterday. Stable intraventricular hemorrhage and EVD along with mild to moderate enlargement of the left temporal lobe.Stable intracranial mass effect with up to 9 mm rightward midline  shift. Basilar cisterns remain patent. No new intracranial abnormality CT head 8/18: 1. Large left temporal lobe intra-axial hematoma has not regressed since 03/31/2022 and now appears slightly larger. Regional edema and rightward midline shift not significantly changed. Basilar cisterns remain patent. 2. Stable right frontal EVD and moderate volume IVH in the left lateral ventricle. Blood has cleared the 3rd and 4th ventricles since 03/31/2022. Per NSGY, 8/16 angiogram aborted and nondiagnostic, limited by patient movement. Repeat angiogram planned for later time when mental status improves or will consider angio with anesthesia 2D Echo EF 60-65%, No shunt or thrombus detected LDL 119 HgbA1c 5.7 VTE prophylaxis - Heparin No antiplatelet or anticoagulants prior to admission per chart review,  now on no antithrombotics due to [place]. Therapy recommendations: AIR.  Disposition:  TBD  Cerebral Edema Obstructive hydrocephalus 8/10: started 3% HTS  8/12: stopped 3% HTS Na 148 this AM EVD placed. NSG is following.  8/18 Ventriculostomy removed. Patient to be transferred to medical floor.  Continue frequent neuro checks per ICU protocol CCM following, appreciate their help.  Agitated delirium Was previously on low-dose precedex.  Patient received ativan, restarted on low dose precedex yesterday. Discontinued ativan order. Recommend minimizing benzo use. Administer seroquel earlier (changed from 11pm->8pm).   Left temporal AVF/ AVM Neurosurgery following CTA more clearly showing this as likely source of hemorrhage Per NSGY, angiogram attempted 8/16 however was aborted and was nondiagnostic and limited by patient movement. Will repeat angiogram.  Dr. Leonie Man discussed with neurosurgery. Plan for angiogram and definitive treatment of AVM by Dr. Kathyrn Sheriff.  HTN No hx of hypertension or home BP meds prior to admission Unstable  Cleviprex drip off. Now on norvasc '10mg'$ , lisinopril '20mg'$ . Both held yesterday and today in setting of BP 100s overnight.  Systolic BP <474  Long term BP goal normotensive.  Hyperlipidemia Home meds: None  LDL 119, goal < 70 High intensity statin to be considered once PO intake improves   Dysphagia Started tube feeding per RD recs Core track placed On IVF, wean off on feeding started and tolerated  8/18 MBS planned.   Elimination Foley catheter in place, high risk for urinary retention due to AMS Had bladder scan with 122m overnight, given LR 5083mbolus On bethanechol '10mg'$  TID  Consider bowel regimen when appropriate  Tobacco abuse Current smoker 1PPD, 30 pack year hx Smoking cessation counseling will be provided  Other Stroke Risk Factors Advanced Age >/= 6579Current ETOH use, alcohol level <10, will be advised to drink no more than 1  drink a day Incidentally noted previous cerebellar stroke   Hypokalemia Potassium repleted. Continue to monitor   Other Active Problems COPD/asthma  Hospital day # 8  StRolanda LundborgMD, PGY-1 04/06/2022 7:32 AM  I have personally obtained history,examined this patient, reviewed notes, independently viewed imaging studies, participated in medical decision making and plan of care.ROS completed by me personally and pertinent positives fully documented  I have made any additions or clarifications directly to the above note. Agree with note above.  Patient is doing well but continues to have mild fluent aphasia.  Ventriculostomy catheter has been discontinued this morning.  Continue ongoing therapies.  Mobilize out of bed.  Transfer to neurology floor bed later today if stable.  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 NuKathyrn Sherifffter end of rehab stay.  Discussed with patient and sister and answered questions.  Discussed with Dr. NuKathyrn Sheriffhis patient is critically ill and at significant risk  of neurological worsening, death and care requires constant monitoring of vital signs, hemodynamics,respiratory and cardiac monitoring, extensive review of multiple databases, frequent neurological assessment, discussion with family, other specialists and medical decision making of high complexity.I have made any additions or clarifications directly to the above note.This critical care time does not reflect procedure time, or teaching time or supervisory time of PA/NP/Med Resident etc but could involve care discussion time.  I spent 30 minutes of neurocritical care time  in the care of  this patient.      Antony Contras, MD Medical Director [place] Pager: [phone] 04/06/2022 2:21 PM   To contact Stroke Continuity provider, please refer to [email or web address] After hours, contact General Neurology

## 2022-04-06 NOTE — Progress Notes (Signed)
Speech Language Pathology Treatment: Dysphagia;Cognitive-Linquistic (Aphasia)  Patient Details Name: [name] Giles MRN: [id number] DOB: June 26, 1956 Today's Date: 04/06/2022 Time: 1030-1056 SLP Time Calculation (min) (ACUTE ONLY): 26 min  Assessment / Plan / Recommendation Clinical Impression  Pt was seen for treatment with her sister present. Pt's verbal output was improved, but communicative intent still reduced with production of multiple fluent utterances with reduced meaning. Neologistic errors and phonemic paraphasias were noted, but did not demonstrate awareness of these errors. Repetition was necessary to improve auditory comprehension. Pt exhibited a baseline cough and coughing was also inconsistently noted with thin liquids via straw. Pt tolerated puree and regular texture solids without overt s/s of aspiration. Mastication and oral clearance were WFL. Considering pt's inconsistent cough with p.o. intake, a modified barium swallow study is recommended to rule out rule out aspiration and it is scheduled for today at 1330.    HPI HPI: Pt is a 66 y/o female who presented with headache, N/V, and AMS. CTH revealed a large L frontotemporal ICH with IVH and 19m midline shift. Repeat head CT with increasing hemorrhage and associated hydrocephalus. Pt s/p ventriculostomy 8/10. Cortrak placed 8/11. PMH: COPD, tobacco abuse, asthma.      SLP Plan  Continue with current plan of care      Recommendations for follow up therapy are one component of a multi-disciplinary discharge planning process, led by the attending physician.  Recommendations may be updated based on patient status, additional functional criteria and insurance authorization.    Recommendations  Diet recommendations: NPO (pt may have ice chips) Liquids provided via: Teaspoon Medication Administration: Via alternative means Supervision: Staff to assist with self feeding Compensations: Slow rate;Small sips/bites Postural Changes  and/or Swallow Maneuvers: Seated upright 90 degrees                Oral Care Recommendations: Oral care BID Follow Up Recommendations: Acute inpatient rehab (3hours/day) Assistance recommended at discharge: Frequent or constant Supervision/Assistance SLP Visit Diagnosis: Aphasia (R47.01);Cognitive communication deficit (R41.841);Dysphagia, unspecified (R13.10) Plan: Continue with current plan of care         Lizvet Chunn I. PHardin Negus MBig Beaver CMustangOffice number [phone]  SHorton Marshall 04/06/2022, 11:19 AM

## 2022-04-06 NOTE — Progress Notes (Signed)
Subjective: The patient is alert and pleasant.  She is in no apparent distress.  She complains of a dry mouth.  Objective: Vital signs in last 24 hours: Temp:  [98.2 F (36.8 C)-101.7 F (38.7 C)] 98.8 F (37.1 C) (08/18 0400) Pulse Rate:  [70-97] 79 (08/18 0600) Resp:  [12-21] 13 (08/18 0600) BP: (84-136)/(44-99) 131/59 (08/18 0600) SpO2:  [91 %-98 %] 97 % (08/18 0600) Estimated body mass index is 24.29 kg/m as calculated from the following:   Height as of this encounter: '5\' 5"'$  (1.651 m).   Weight as of this encounter: 66.2 kg.   Intake/Output from previous day: 08/17 0701 - 08/18 0700 In: 1150 [NG/GT:1150] Out: 346 [Urine:250; Drains:96] Intake/Output this shift: Total I/O In: 550 [NG/GT:550] Out: 296 [Urine:250; Drains:46]  Physical exam the patient is alert and pleasant.  Her speech is improving.  She is moving all 4 extremities well.  I have reviewed the patient's follow-up head CT performed today.  She has had the expected evolution of the left temporal hemorrhage.  She still has interventricular blood.  There is no hydrocephalus.  Her ventriculostomy has not drained much overnight.  Lab Results: Recent Labs    04/05/22 0707 04/06/22 0234  WBC 10.1 13.6*  HGB 13.3 12.2  HCT 39.4 37.4  PLT 190 178   BMET Recent Labs    04/05/22 0707 04/05/22 1646  NA 148* 150*  K 3.7 3.8  CL 117* 115*  CO2 25 28  GLUCOSE 134* 162*  BUN 33* 39*  CREATININE 0.67 0.81  CALCIUM 8.8* 9.4    Studies/Results: CT HEAD WO CONTRAST (5MM)  Result Date: 04/06/2022 CLINICAL DATA:  66 year old female presenting with left temporal lobe hemorrhage, IVH and EVD. Subsequent encounter. EXAM: CT HEAD WITHOUT CONTRAST TECHNIQUE: Contiguous axial images were obtained from the base of the skull through the vertex without intravenous contrast. RADIATION DOSE REDUCTION: This exam was performed according to the departmental dose-optimization program which includes automated exposure control,  adjustment of the mA and/or kV according to patient size and/or use of iterative reconstruction technique. COMPARISON:  03/31/2022 head CT and earlier. FINDINGS: Brain: Ongoing hyperdense intra-axial hemorrhage in the left hemisphere throughout the temporal lobe. Hematoma size now 66 mm x 47 mm (including mesial temporal lobe blood) by 51 mm (AP by transverse by CC) estimated blood products now up to 79 mL, mildly progressed from prior exams. Ongoing regional edema, with perhaps greater extension into the posterior deep white matter capsules now on series 3, image 16. Rightward midline shift of 10 mm is 1 mm greater since 03/31/2022. Basilar cisterns remain patent. Stable right frontal approach EVD which terminates at the septum pellucidum and does communicate with the right lateral ventricle on coronal image 29. Moderate volume intraventricular hemorrhage mostly in the left lateral ventricle now. Clearance of blood from the 3rd and 4th ventricles. No other extra-axial extension of blood. No cortically based acute infarct identified. Vascular: Calcified atherosclerosis at the skull base. Skull: Stable right frontal burr hole. No acute osseous abnormality identified. Sinuses/Orbits: Left nasoenteric tube remains in place. Stable sinus and mastoid aeration. Other: Minimal postsurgical changes to the superior right scalp. Mild rightward gaze but otherwise orbits appear negative. IMPRESSION: 1. Large left temporal lobe intra-axial hematoma has not regressed since 03/31/2022 and now appears slightly larger. Regional edema and rightward midline shift not significantly changed. Basilar cisterns remain patent. 2. Stable right frontal EVD and moderate volume IVH in the left lateral ventricle. Blood has cleared the 3rd and  4th ventricles since 03/31/2022. 3. No new intracranial abnormality. Electronically Signed   By: Genevie Ann M.D.   On: 04/06/2022 04:59   IR ANGIO INTRA EXTRACRAN SEL INTERNAL CAROTID BILAT MOD SED  Result  Date: 04/05/2022 PROCEDURE: ABORTED DIAGNOSTIC CEREBRAL ANGIOGRAM HISTORY: The patient is a 66 year old woman admitted to the hospital with left temporal hematoma and CT angiogram suggesting the likelihood of a dural fistula. She therefore presents for diagnostic cerebral angiogram. ACCESS: The technical aspects of the procedure as well as its potential risks and benefits were reviewed with the patient's family. These risks included but were not limited bleeding, infection, allergic reaction, damage to organs or vital structures, stroke, non-diagnostic procedure, and the catastrophic outcomes of heart attack, coma, and death. With an understanding of these risks, informed consent was obtained and witnessed. The patient was placed in the supine position on the angiography table and the skin of right groin prepped in the usual sterile fashion. Multiple attempts were made to access the right common femoral artery. Ultimately, ultrasound guidance was used to introduce the micro puncture needle into the more proximal right common femoral artery and advanced the cope Mandril wire, however fluoroscopic image revealed puncture site likely proximal to the right inguinal ligament. Patient was also noted to be moving and not able to follow simple verbal commands. Because of her motion, I felt it highly unlikely that we would be able to obtain meaningful angiographic images. I was also concerned about hematoma if we did place a sheath in the femoral artery. I therefore elected to abort the procedure at this time. The micro puncture needle was therefore removed and hemostasis was secured with manual compression. MEDICATIONS: HEPARIN: 0 Units total. CONTRAST:  cc, Omnipaque 300 FLUOROSCOPY TIME:  FLUOROSCOPY TIME: See IR records TECHNIQUE: CATHETERS AND WIRES 5-French JB-1 catheter 0.035" glidewire FINDINGS: None (procedure aborted) DISPOSITION: The patient was returned to the neuro intensive care unit for further care.  IMPRESSION: 1. Aborted attempt at diagnostic cerebral angiogram due primarily to significant patient motion. 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 She therefore presents for diagnostic cerebral angiogram. ACCESS: The technical aspects of the procedure as well as its potential risks and benefits were reviewed with the patient's family. These risks included but were not limited bleeding, infection, allergic reaction, damage to organs or vital structures, stroke, non-diagnostic procedure, and the catastrophic outcomes of heart attack, coma, and death. With an understanding of these risks, informed consent was obtained and witnessed. The patient was placed in the supine position on the angiography table and the skin of right groin prepped in the usual sterile fashion. Multiple attempts were made to access the right common femoral artery. Ultimately, ultrasound guidance was used to introduce the micro puncture needle into the more proximal right common femoral artery and advanced the cope Mandril wire, however fluoroscopic image revealed puncture site likely proximal to the right inguinal ligament. Patient was also noted to be moving and not able to follow simple verbal commands. Because of her motion, I felt it highly unlikely that we would be able to obtain meaningful angiographic images. I was also concerned about hematoma if we did place a sheath in the femoral artery. I therefore elected to abort  the procedure at this time. The micro puncture needle was therefore removed and hemostasis was secured with manual compression. MEDICATIONS: HEPARIN: 0 Units total. CONTRAST:  cc, Omnipaque 300 FLUOROSCOPY TIME:  FLUOROSCOPY  TIME: See IR records TECHNIQUE: CATHETERS AND WIRES 5-French JB-1 catheter 0.035" glidewire FINDINGS: None (procedure aborted) DISPOSITION: The patient was returned to the neuro intensive care unit for further care. IMPRESSION: 1. Aborted attempt at diagnostic cerebral angiogram due primarily to significant patient motion. Electronically Signed   By: [name]   On: 04/05/2022 18:08   DG Abd Portable 1V  Result Date: 04/04/2022 CLINICAL DATA:  Nasogastric tube placement EXAM: PORTABLE ABDOMEN - 1 VIEW COMPARISON:  March 30, 2022 FINDINGS: The bowel gas pattern is normal. No radio-opaque calculi or other significant radiographic abnormality are seen. The distal tip of the nasogastric tube projects over the stomach. Partially visualized central venous catheter, terminating near the superior cavoatrial junction. The visualized bibasilar lungs are clear. IMPRESSION: The distal tip of the nasogastric tube is in the stomach. Electronically Signed   By: Beryle Flock M.D.   On: 04/04/2022 10:21    Assessment/Plan: Intracerebral hemorrhage, interventricular hemorrhage: The patient is doing well clinically.  I removed her ventriculostomy.  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  LOS: 8 days     [name] Giles 04/06/2022, 6:45 AM     Patient ID: [name] Giles, female   DOB: Aug 30, 1955, 66 y.o.   MRN: [id number]

## 2022-04-06 NOTE — Progress Notes (Signed)
Serosanguineous fluid draining from site of EVD incision after drain taken out when pt coughs, Dr. Arnoldo Morale paged x2.

## 2022-04-07 ENCOUNTER — Inpatient Hospital Stay (HOSPITAL_COMMUNITY): Payer: 59

## 2022-04-07 DIAGNOSIS — I611 Nontraumatic intracerebral hemorrhage in hemisphere, cortical: Secondary | ICD-10-CM | POA: Diagnosis not present

## 2022-04-07 DIAGNOSIS — E785 Hyperlipidemia, unspecified: Secondary | ICD-10-CM

## 2022-04-07 DIAGNOSIS — I10 Essential (primary) hypertension: Secondary | ICD-10-CM

## 2022-04-07 DIAGNOSIS — R1310 Dysphagia, unspecified: Secondary | ICD-10-CM | POA: Diagnosis not present

## 2022-04-07 DIAGNOSIS — G936 Cerebral edema: Secondary | ICD-10-CM | POA: Diagnosis not present

## 2022-04-07 DIAGNOSIS — G911 Obstructive hydrocephalus: Secondary | ICD-10-CM | POA: Diagnosis not present

## 2022-04-07 DIAGNOSIS — Z720 Tobacco use: Secondary | ICD-10-CM

## 2022-04-07 LAB — GLUCOSE, CAPILLARY
Glucose-Capillary: 102 mg/dL — ABNORMAL HIGH (ref 70–99)
Glucose-Capillary: 103 mg/dL — ABNORMAL HIGH (ref 70–99)
Glucose-Capillary: 113 mg/dL — ABNORMAL HIGH (ref 70–99)
Glucose-Capillary: 134 mg/dL — ABNORMAL HIGH (ref 70–99)
Glucose-Capillary: 147 mg/dL — ABNORMAL HIGH (ref 70–99)

## 2022-04-07 LAB — BASIC METABOLIC PANEL
Anion gap: 9 (ref 5–15)
BUN: 25 mg/dL — ABNORMAL HIGH (ref 8–23)
CO2: 29 mmol/L (ref 22–32)
Calcium: 9.2 mg/dL (ref 8.9–10.3)
Chloride: 107 mmol/L (ref 98–111)
Creatinine, Ser: 0.61 mg/dL (ref 0.44–1.00)
GFR, Estimated: >60 mL/min (ref >60)
Glucose, Bld: 99 mg/dL (ref 70–99)
Potassium: 3.5 mmol/L (ref 3.5–5.1)
Sodium: 145 mmol/L (ref 135–145)

## 2022-04-07 LAB — CBC
HCT: 40.1 % (ref 36.0–46.0)
Hemoglobin: 13.5 g/dL (ref 12.0–15.0)
MCH: 30.7 pg (ref 26.0–34.0)
MCHC: 33.7 g/dL (ref 30.0–36.0)
MCV: 91.1 fL (ref 80.0–100.0)
Platelets: 204 10*3/uL (ref 150–400)
RBC: 4.4 MIL/uL (ref 3.87–5.11)
RDW: 13.6 % (ref 11.5–15.5)
WBC: 13.8 10*3/uL — ABNORMAL HIGH (ref 4.0–10.5)
nRBC: 0 % (ref 0.0–0.2)

## 2022-04-07 MED ORDER — QUETIAPINE FUMARATE 25 MG PO TABS
25.0000 mg | ORAL_TABLET | Freq: Every evening | ORAL | Status: DC
  Administered 2022-04-07 – 2022-04-15 (×8): 25 mg via ORAL
  Filled 2022-04-07 (×9): qty 1

## 2022-04-07 MED ORDER — OXYCODONE HCL 5 MG PO TABS
5.0000 mg | ORAL_TABLET | ORAL | Status: DC | PRN
  Administered 2022-04-07: 5 mg via ORAL
  Filled 2022-04-07: qty 1

## 2022-04-07 MED ORDER — PROSOURCE PLUS PO LIQD
30.0000 mL | Freq: Two times a day (BID) | ORAL | Status: DC
  Administered 2022-04-08 – 2022-04-10 (×6): 30 mL via ORAL
  Filled 2022-04-07 (×6): qty 30

## 2022-04-07 MED ORDER — ROSUVASTATIN CALCIUM 20 MG PO TABS
20.0000 mg | ORAL_TABLET | Freq: Every day | ORAL | Status: DC
  Administered 2022-04-07 – 2022-04-16 (×9): 20 mg via ORAL
  Filled 2022-04-07 (×10): qty 1

## 2022-04-07 MED ORDER — BETHANECHOL CHLORIDE 10 MG PO TABS
10.0000 mg | ORAL_TABLET | Freq: Three times a day (TID) | ORAL | Status: DC
  Administered 2022-04-07 – 2022-04-16 (×25): 10 mg via ORAL
  Filled 2022-04-07 (×28): qty 1

## 2022-04-07 MED ORDER — SENNOSIDES-DOCUSATE SODIUM 8.6-50 MG PO TABS
1.0000 | ORAL_TABLET | Freq: Two times a day (BID) | ORAL | Status: DC
  Administered 2022-04-07 – 2022-04-15 (×15): 1 via ORAL
  Filled 2022-04-07 (×17): qty 1

## 2022-04-07 NOTE — Progress Notes (Signed)
   Providing Compassionate, Quality Care - Together  NEUROSURGERY PROGRESS NOTE   S: No issues overnight.  Status post EVD removal yesterday  O: EXAM:  BP 129/61 (BP Location: Left Arm)   Pulse 72   Temp 98.6 F (37 C) (Axillary)   Resp 15   Ht '5\' 5"'$  (1.651 m)   Wt 66.2 kg   SpO2 93%   BMI 24.29 kg/m   Awake, alert, disoriented PERRL Speech fluent, some difficulties with word finding CNs grossly intact  5/5 BUE/BLE  Follows commands x4 Incision clean dry and intact  ASSESSMENT:  66 y.o. female with   Intracerebral/intraventricular hemorrhage, status post EVD AVM/AV fistula  PLAN: -Okay to stepdown from ICU, stable status post EVD removal -Diagnostic cerebral angiogram once cooperative    Thank you for allowing me to participate in this patient's care.  Please do not hesitate to call with questions or concerns.   Elwin Sleight, [facility] Cell: [phone]

## 2022-04-07 NOTE — Progress Notes (Signed)
Patient has pulled the cortrax tube out of her nose; the bridal is still intact; bridal cut and removed; patient face cleansed; no acute distress; will notify MD.  Continue waist restraint as patient is attempting to climb out of bed without assistance; reorients and redirects easily.

## 2022-04-07 NOTE — Progress Notes (Addendum)
STROKE TEAM PROGRESS NOTE   INTERVAL HISTORY BP < 160 overnight. Discontinued lisinopril '20mg'$  yesterday and held amlodipine this AM. Afebrile overnight. WBC 13.8. Na 145. S/p MBS yesterday with SLP diet recommend regular solids, thin liquid. CXR unremarkable. UA pending.   Patient is alert, not oriented. When asked where she is, she states "chicken." She will repeat phrases. Patient with fluent aphasia. Intermittently following commands.    Vitals:   04/06/22 1947 04/06/22 2000 04/07/22 0000 04/07/22 0400  BP:  (!) 132/110 (!) 112/52 (!) 140/56  Pulse: 82 84 88 85  Resp: '16 18 17 17  '$ Temp:  99 F (37.2 C)  98.8 F (37.1 C)  TempSrc:    Oral  SpO2: 97% 95%  96%  Weight:      Height:       CBC:  Recent Labs  Lab 04/05/22 0707 04/06/22 0234 04/07/22 0131  WBC 10.1 13.6* 13.8*  NEUTROABS 6.4  --   --   HGB 13.3 12.2 13.5  HCT 39.4 37.4 40.1  MCV 91.4 91.9 91.1  PLT 190 178 671   Basic Metabolic Panel:  Recent Labs  Lab 04/02/22 0602 04/02/22 1200 04/05/22 0707 04/05/22 1646 04/07/22 0131  NA 153*   < > 148* 150* 145  K 3.3*   < > 3.7 3.8 3.5  CL 121*   < > 117* 115* 107  CO2 27   < > '25 28 29  '$ GLUCOSE 141*   < > 134* 162* 99  BUN 26*   < > 33* 39* 25*  CREATININE 0.43*   < > 0.67 0.81 0.61  CALCIUM 8.7*   < > 8.8* 9.4 9.2  MG 2.1  --  2.3  --   --   PHOS 3.4  --   --   --   --    < > = values in this interval not displayed.   Lipid Panel:  No results for input(s): "CHOL", "TRIG", "HDL", "CHOLHDL", "VLDL", "LDLCALC" in the last 168 hours.  HgbA1c:  No results for input(s): "HGBA1C" in the last 168 hours.  Urine Drug Screen:  No results for input(s): "LABOPIA", "COCAINSCRNUR", "LABBENZ", "AMPHETMU", "THCU", "LABBARB" in the last 168 hours.   Alcohol Level  No results for input(s): "ETH" in the last 168 hours.   IMAGING past 24 hours DG Swallowing Func-Speech Pathology  Result Date: 04/06/2022 Table formatting from the original result was not included.  Objective Swallowing Evaluation: Type of Study: MBS-Modified Barium Swallow Study  Patient Details Name: [name] Giles MRN: [id number] Date of Birth: [birth year] Today's Date: 04/06/2022 Time: SLP Start Time (ACUTE ONLY): 1336 -SLP Stop Time (ACUTE ONLY): 1352 SLP Time Calculation (min) (ACUTE ONLY): 16 min Past Medical History: Past Medical History: Diagnosis Date  Asthma   Medical history non-contributory  Past Surgical History: Past Surgical History: Procedure Laterality Date  COLONOSCOPY    COLONOSCOPY N/A 04/06/2013  Procedure: COLONOSCOPY;  Surgeon: Daneil Dolin, MD;  Location: [facility];  Service: Endoscopy;  Laterality: N/A;  9:30 AM  ENDOMETRIAL ABLATION    IR ANGIO INTRA EXTRACRAN SEL INTERNAL CAROTID BILAT MOD SED  04/04/2022  IR US GUIDE VASC ACCESS RIGHT  04/04/2022 HPI: Pt is a 66 y/o female who presented with headache, N/V, and AMS. CTH revealed a large L frontotemporal ICH with IVH and 76m midline shift. Repeat head CT with increasing hemorrhage and associated hydrocephalus. Pt s/p ventriculostomy 8/10. PMH: COPD, tobacco abuse, asthma.  No data recorded  Recommendations for  follow up therapy are one component of a multi-disciplinary discharge planning process, led by the attending physician.  Recommendations may be updated based on patient status, additional functional criteria and insurance authorization. Assessment / Plan / Recommendation   04/06/2022   2:33 PM Clinical Impressions Clinical Impression Pt's oropharyngeal swallow mechanism is within functional limits. Decreased bolus cohesion was noted once when pt filled her oral cavity with 4oz of thin liquids; premature spillage was noted thereafter with transient penetration (PAS 2). However this depth of laryngeal invasion is considered to be WNL and this was not replicated with smaller boluses. Coughing was observed during the study as was noted at bedside; however, no instances of dysfunctional penetration or aspiration were demonstrated at  these times and coughing is therefore believed to be unrelated to swallowing. Pt was able to swallow a 33m barium tablet with thin liquids. Additional boluses of thin liquids were necesssary to facilitate trasport through the cervical and upper thoracic esophagus, but SLP suspects that the presence of the Cortrak impacted this. A regular texture diet with thin liquids is recommended at this time and SLP will follow briefly for swallowing to ensure tolerance. SLP Visit Diagnosis Dysphagia, unspecified (R13.10) Impact on safety and function No limitations     04/06/2022   2:33 PM Treatment Recommendations Treatment Recommendations Therapy as outlined in treatment plan below     04/06/2022   2:33 PM Prognosis Prognosis for Safe Diet Advancement Good Barriers to Reach Goals Language deficits;Cognitive deficits   04/06/2022   2:33 PM Diet Recommendations SLP Diet Recommendations Regular solids;Thin liquid Liquid Administration via Cup;Straw Medication Administration Whole meds with liquid Compensations Minimize environmental distractions;Slow rate Postural Changes Seated upright at 90 degrees     04/06/2022   2:33 PM Other Recommendations Oral Care Recommendations Oral care BID Follow Up Recommendations Acute inpatient rehab (3hours/day) Assistance recommended at discharge Frequent or constant Supervision/Assistance Functional Status Assessment Patient has had a recent decline in their functional status and demonstrates the ability to make significant improvements in function in a reasonable and predictable amount of time.   04/06/2022   2:33 PM Frequency and Duration  Speech Therapy Frequency (ACUTE ONLY) min 2x/week Treatment Duration 2 weeks     04/06/2022   2:33 PM [facility]   04/06/2022   2:33 PM Pharyngeal [facility]   04/06/2022   2:33 PM Cervical Esophageal Phase  Cervical Esophageal Phase [facility] Center[facility]anika I. PHardin Negus [place] CButlerOffice number [phone] SHorton Marshall8/18/2023, 2:48 PM                      PHYSICAL EXAM 66yo female lying in bed with EVD in place. She is alert and oriented to self. Not oriented to situation, time, place. Fluent speech with intact repetition, comprehension not intact. Not able to name objects. Will repeat phrases but then has word salad. Appears to have improving global aphasia.  She intermittently follows commands and lifts BUE and BLE against gravity. RUE 4/5 strength compared to LUE.  Lateral gaze intact. Cranial nerves: PERRL, tongue midline, face symmetric at rest and smiling.  Sensation intact throughout.    ASSESSMENT/PLAN 66year old female with history of COPD, smoker admitted for headache, nausea, vomiting, found down in bathroom with decreased level consciousness.  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  CT repeat showed mild increase in size. NSGY  consulted. Dr. Arnoldo Morale on board. Status post EVD. MRI with and without contrast showed 68 cc ICH, with IVH and midline shift 70m.  Repeat CT showed stable hematoma but midline shift up to 1 cm.  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  CTV no this thrombosis.  EF 60 to 65%, LDL 119, A1c 5.7, UDS negative. Fluent speech, lacking comprehension, and able to repeat. Given mixed findings including intermittent comprehension, clinical picture most consistent with improving global aphasia.   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  Impending left uncal herniation.  Midline shift 5 mm Repeat HCT 2 hours after Interval increase in size of Left IPH. New IPH within the left uncus/medial temporal lobe.  MRI  03/29/22 - 68 mL intraparenchymal hemorrhage centered at the left temporal lobe, similar in size as compared to prior CT. midline shift 7 mm CT repeat 03/30/22 stable  hematoma but increased midline shift to 1 cm CTA Head and Neck Cluster of abnormal, tortuous vessels along the lateral aspect of the left temporal lobe compatible with an AVF for AVM.  CT venogram negative for venous thrombosis Repeat HCT 8/12 showed Stable hematoma and ventricle size, stable intracranial mass effect with up to 9 mm MLS.  CT head 8/18: stable hematoma and EVD.  Blood has cleared the 3rd and 4th ventricles since 03/31/2022. 2D Echo EF 60-65%, No shunt or thrombus detected LDL 119 HgbA1c 5.7 VTE prophylaxis - Heparin subcu No antiplatelet or anticoagulants prior to admission per chart review, now on no antithrombotics due to [place] Therapy recommendations: AIR.  Disposition:  TBD  Cerebral Edema Obstructive hydrocephalus 8/10: started 3% HTS  8/12: stopped 3% HTS Na 148 this AM EVD placed. NSG is following.  8/18 Ventriculostomy removed. Patient to be transferred to medical floor.  Continue frequent neuro checks per ICU protocol CCM following, appreciate their help.  Agitated delirium Was previously on low-dose precedex.  Patient received ativan, restarted on low dose precedex yesterday. Discontinued ativan order. Recommend minimizing benzo use. Administer seroquel earlier (changed from 11pm->8pm).  Can consider discontinuing seroquel   Left temporal AVF/ AVM Neurosurgery following CTA more clearly showing this as likely source of hemorrhage Per NSGY, angiogram attempted 8/16 however was aborted and was nondiagnostic and limited by patient movement.  Repeat angiogram planned for later time with Dr. NKathyrn Sheriffwhen mental status improves or will consider angio with anesthesia  HTN No hx of hypertension or home BP meds prior to admission Stable now Stopped norvasc '10mg'$  in the setting of hypotensive events overnight. Can resume if BP increases.  Systolic BP <<836 Long term BP goal normotensive.  Hyperlipidemia Home meds: None  LDL 119, goal < 70 On Crestor  20 Continue statin on discharge  Dysphagia Was on tube feeding per RD recs Core track was removed 8/18 Passed MBS, recommended regular solid, thin liquid diet.  Urinary retention Foley catheter in place, high risk for urinary retention due to AMS Had bladder scan with 1567movernight, given LR 50025molus On bethanechol '10mg'$  TID  On 1 tab senna  Tobacco abuse Current smoker 1PPD, 30 pack year hx Smoking cessation counseling will be provided  Other Stroke Risk Factors Advanced Age >/= 65 61urrent ETOH use, alcohol level <10, will be advised to drink no more than 1 drink a day Incidentally noted previous cerebellar stroke  Hypokalemia Potassium repleted. Continue to monitor   Other Active Problems COPD/asthma H/o fever Suspect due to irritation from hemorrhage  Cxr unremarkable  Afebrile w/in last 24 hrs  UA pending   Hospital day # 9  Rolanda Lundborg, MD, PGY-1 04/07/2022 7:25 AM  ATTENDING NOTE: I reviewed above note and agree with the assessment and plan. Pt was seen and examined.   No family at bedside.  Patient reclining in bed, awake alert, word salad with intermittent meaningful sentences.  Largely not following commands, however able to answer some orientation questions.  However, able to repeat but not able to name.  Moving all extremities symmetrically except right upper extremity 4/5.  Sensation and coordination not cooperative.  BP stable on the lower end, hold off amlodipine for now and close BP monitoring.  Last CT repeat 8/18 shows stable hematoma with midline shift 1 cm.  Cerebral angiogram aborted due to movement.  Plan to repeat angiogram later for AVM management.  PT/OT recommend CIR.  Fever has resolved, WBC 13.6-> 13.8.  Sodium 150-> 145.  CXR negative, UA pending.  Will follow.  For detailed assessment and plan, please refer to above/below as I have made changes wherever appropriate.   Rosalin Hawking, MD PhD Stroke Neurology 04/07/2022 5:39  PM    To contact Stroke Continuity provider, please refer to [email or web address] After hours, contact General Neurology

## 2022-04-07 NOTE — Progress Notes (Signed)
PROGRESS NOTE    [name] Giles  LOV:[id number] DOB: June 16, 1956 DOA: 03/29/2022 PCP: Redmond School, MD   Brief Narrative: [name] Giles is a 66 y.o. female with a history of COPD, tobacco use and asthma. 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 Patient was admitted to the ICU for management. Ventriculostomy secondary to hydrocephalus which has improved. PT/OT recommending acute inpatient rehabilitation on discharge. Patient continues to suffer from delirium.   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 Patient was admitted to the ICU on admission under stoke service. Initial CT head (8/10) identified large temporal and frontal ICH and IVH with impending left uncal herniation and midline shift of 5 mm with 2 hour interval increase of left IPH and new IPH within the left uncus/medial temporal lobe. MRI (8/10) with similar findings in addition to increased midline shift to 7 mm and repeat CT head (8/11) significant for increased midline shift to 10 mm. CTA head/neck (8/11) significant for AVF/AVM of left temporal lobe. CT head (8/12) was significant for stabilized lesions. LDL of 110, hemoglobin A1C of 5.7% and Transthoracic Echocardiogram significant for LVEF of 60-65% with no atrial level shunt identified and no thrombus identified. PT/OT recommending acute inpatient rehabilitation.  Cerebral edema Obstructive hydrocephalus Neurosurgery consulted. Patient managed on 3% hypertonic saline from 8/10 until 8/12. Right frontal ventriculostomy placed on 8/10 by neurosurgery and was removed on 8/18.  Delirium Patient managed with Precedex in the ICU which was weaned off. 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 Neurosurgery recommending angiogram which will need to be performed  when patient is more cooperative.  Hypertension No prior known history. Patient was managed with Cleviprex IV drip while in the ICU in addition to amlodipine. Amlodipine discontinued secondary to hypotension.  Hyperlipidemia LDL of 119. Statin not started secondary to NPO status. Recommendation for high intensity statin once able to take PO.  Dysphagia Cortrak placed and tube feeds initiated. SLP on board. Confusion is prohibiting ability to advance diet. -Continue Cortrak and tube feeds  Tobacco use Noted. Not started in nicotine replacement therapy at this time.  Hypokalemia Resolved with repletion.  Hypernatremia Peak sodium of 151. Complicated by NPO status. Appears to have resolved with IV fluids -Repeat BMP in AM  Prediabetes Hemoglobin A1C calculated to be 5.7%.  COPD Patient is prescribed Symbicort and albuterol as an out patient, both listed as not taking. Currently no exacerbation -Continue Pulmicort and Brovana scheduled -Continue Duoneb PRN  DVT prophylaxis: Heparin subq Code Status:   Code Status: Full Code Family Communication: None at bedside Disposition Plan: Discharge likely to inpatient rehabilitation once delirium improves and oral intake plan established   Consultants:  Neurology/Stroke Neurosurgery PCCM  Procedures:    Antimicrobials:     Subjective: Patient reports no issues  Objective: BP (!) 108/52 (BP Location: Left Arm)   Pulse 91   Temp 99.1 F (37.3 C) (Oral)   Resp 18   Ht '5\' 5"'$  (1.651 m)   Wt 66.2 kg   SpO2 94%   BMI 24.29 kg/m   Examination:  General exam: Appears calm and comfortable Respiratory system: Clear to auscultation. Respiratory effort normal. Cardiovascular system: S1 & S2 heard, RRR. Gastrointestinal system: Abdomen is nondistended, soft and nontender. Normal bowel sounds heard. Central nervous system: Alert. Expressive aphasia. Musculoskeletal: No edema. No  calf tenderness Skin: No cyanosis. No  rashes   Data Reviewed: I have personally reviewed following labs and imaging studies  CBC Lab Results  Component Value Date   WBC 13.8 (H) 04/07/2022   RBC 4.40 04/07/2022   HGB 13.5 04/07/2022   HCT 40.1 04/07/2022   MCV 91.1 04/07/2022   MCH 30.7 04/07/2022   PLT 204 04/07/2022   MCHC 33.7 04/07/2022   RDW 13.6 04/07/2022   LYMPHSABS 2.3 04/05/2022   MONOABS 1.2 (H) 04/05/2022   EOSABS 0.0 04/05/2022   BASOSABS 0.0 16/05/9603     Last metabolic panel Lab Results  Component Value Date   NA 145 04/07/2022   K 3.5 04/07/2022   CL 107 04/07/2022   CO2 29 04/07/2022   BUN 25 (H) 04/07/2022   CREATININE 0.61 04/07/2022   GLUCOSE 99 04/07/2022   GFRNONAA >60 04/07/2022   GFRAA >60 12/03/2019   CALCIUM 9.2 04/07/2022   PHOS 3.4 04/02/2022   PROT 6.0 (L) 04/05/2022   ALBUMIN 3.0 (L) 04/05/2022   BILITOT 0.6 04/05/2022   ALKPHOS 72 04/05/2022   AST 20 04/05/2022   ALT 26 04/05/2022   ANIONGAP 9 04/07/2022    GFR: Estimated Creatinine Clearance: 62.2 mL/min (by C-G formula based on SCr of 0.61 mg/dL).  Recent Results (from the past 240 hour(s))  SARS Coronavirus 2 by RT PCR (hospital order, performed in [facility] hospital lab) *cepheid single result test* Anterior Nasal Swab     Status: None   Collection Time: 03/29/22  3:35 PM   Specimen: Anterior Nasal Swab  Result Value Ref Range Status   SARS Coronavirus 2 by RT PCR NEGATIVE NEGATIVE Final    Comment: (NOTE) SARS-CoV-2 target nucleic acids are NOT DETECTED.  The SARS-CoV-2 RNA is generally detectable in upper and lower respiratory specimens during the acute phase of infection. The lowest concentration of SARS-CoV-2 viral copies this assay can detect is 250 copies / mL. A negative result does not preclude SARS-CoV-2 infection and should not be used as the sole basis for treatment or other patient management decisions.  A negative result may occur with improper specimen collection / handling, submission of  specimen other than nasopharyngeal swab, presence of viral mutation(s) within the areas targeted by this assay, and inadequate number of viral copies (<250 copies / mL). A negative result must be combined with clinical observations, patient history, and epidemiological information.  Fact Sheet for Patients:   https://www.patel.info/  Fact Sheet for Healthcare Providers: https://hall.com/  This test is not yet approved or  cleared by the [place] FDA and has been authorized for detection and/or diagnosis of SARS-CoV-2 by FDA under an Emergency Use Authorization (EUA).  This EUA will remain in effect (meaning this test can be used) for the duration of the COVID-19 declaration under Section 564(b)(1) of the Act, 21 U.S.C. section 360bbb-3(b)(1), unless the authorization is terminated or revoked sooner.  Performed at [facility], [address] [place], [place] [address]   MRSA Next Gen by PCR, Nasal     Status: None   Collection Time: 03/29/22  8:40 PM   Specimen: Nasal Mucosa; Nasal Swab  Result Value Ref Range Status   MRSA by PCR Next Gen NOT DETECTED NOT DETECTED Final    Comment: (NOTE) The GeneXpert MRSA Assay (FDA approved for NASAL specimens only), is one component of a comprehensive MRSA colonization surveillance program. It is not intended to diagnose MRSA infection nor to guide or monitor treatment for MRSA  infections. Test performance is not FDA approved in patients less than 79 years old. Performed at [facility], [place] [address], [place], [place] [address]       Radiology Studies: DG CHEST PORT 1 VIEW  Result Date: 04/07/2022 CLINICAL DATA:  Fever. EXAM: PORTABLE CHEST 1 VIEW COMPARISON:  03/29/2022. FINDINGS: Cardiac silhouette is normal in size. Normal mediastinal and hilar contours. Nasogastric tube passes below the diaphragm. Lungs hyperexpanded, but clear. No pleural effusion or pneumothorax.  Skeletal structures are grossly intact. IMPRESSION: No active disease. Electronically Signed   By: Lajean Manes M.D.   On: 04/07/2022 10:18   DG Swallowing Func-Speech Pathology  Result Date: 04/06/2022 Table formatting from the original result was not included. Objective Swallowing Evaluation: Type of Study: MBS-Modified Barium Swallow Study  Patient Details Name: [name] Giles MRN: [id number] Date of Birth: 07-11-1956 Today's Date: 04/06/2022 Time: SLP Start Time (ACUTE ONLY): 1336 -SLP Stop Time (ACUTE ONLY): 1352 SLP Time Calculation (min) (ACUTE ONLY): 16 min Past Medical History: Past Medical History: Diagnosis Date  Asthma   Medical history non-contributory  Past Surgical History: Past Surgical History: Procedure Laterality Date  COLONOSCOPY    COLONOSCOPY N/A 04/06/2013  Procedure: COLONOSCOPY;  Surgeon: Daneil Dolin, MD;  Location: [facility];  Service: Endoscopy;  Laterality: N/A;  9:30 AM  ENDOMETRIAL ABLATION    IR ANGIO INTRA EXTRACRAN SEL INTERNAL CAROTID BILAT MOD SED  04/04/2022  IR US GUIDE VASC ACCESS RIGHT  04/04/2022 HPI: Pt is a 66 y/o female who presented with headache, N/V, and AMS. CTH revealed a large L frontotemporal ICH with IVH and 62m midline shift. Repeat head CT with increasing hemorrhage and associated hydrocephalus. Pt s/p ventriculostomy 8/10. PMH: COPD, tobacco abuse, asthma.  No data recorded  Recommendations for follow up therapy are one component of a multi-disciplinary discharge planning process, led by the attending physician.  Recommendations may be updated based on patient status, additional functional criteria and insurance authorization. Assessment / Plan / Recommendation   04/06/2022   2:33 PM Clinical Impressions Clinical Impression Pt's oropharyngeal swallow mechanism is within functional limits. Decreased bolus cohesion was noted once when pt filled her oral cavity with 4oz of thin liquids; premature spillage was noted thereafter with transient penetration (PAS  2). However this depth of laryngeal invasion is considered to be WNL and this was not replicated with smaller boluses. Coughing was observed during the study as was noted at bedside; however, no instances of dysfunctional penetration or aspiration were demonstrated at these times and coughing is therefore believed to be unrelated to swallowing. Pt was able to swallow a 112mbarium tablet with thin liquids. Additional boluses of thin liquids were necesssary to facilitate trasport through the cervical and upper thoracic esophagus, but SLP suspects that the presence of the Cortrak impacted this. A regular texture diet with thin liquids is recommended at this time and SLP will follow briefly for swallowing to ensure tolerance. SLP Visit Diagnosis Dysphagia, unspecified (R13.10) Impact on safety and function No limitations     04/06/2022   2:33 PM Treatment Recommendations Treatment Recommendations Therapy as outlined in treatment plan below     04/06/2022   2:33 PM Prognosis Prognosis for Safe Diet Advancement Good Barriers to Reach Goals Language deficits;Cognitive deficits   04/06/2022   2:33 PM Diet Recommendations SLP Diet Recommendations Regular solids;Thin liquid Liquid Administration via Cup;Straw Medication Administration Whole meds with liquid Compensations Minimize environmental distractions;Slow rate Postural Changes Seated upright at 90 degrees  04/06/2022   2:33 PM Other Recommendations Oral Care Recommendations Oral care BID Follow Up Recommendations Acute inpatient rehab (3hours/day) Assistance recommended at discharge Frequent or constant Supervision/Assistance Functional Status Assessment Patient has had a recent decline in their functional status and demonstrates the ability to make significant improvements in function in a reasonable and predictable amount of time.   04/06/2022   2:33 PM Frequency and Duration  Speech Therapy Frequency (ACUTE ONLY) min 2x/week Treatment Duration 2 weeks     04/06/2022    2:33 PM Oral Phase Oral Phase [facility]    04/06/2022   2:33 PM Pharyngeal Phase Pharyngeal Phase [facility]    04/06/2022   2:33 PM Cervical Esophageal Phase  Cervical Esophageal Phase [facility] Shanika I. Hardin Negus, [place], [place] Office number [phone] Horton Marshall 04/06/2022, 2:48 PM                     CT HEAD WO CONTRAST (5MM)  Result Date: 04/06/2022 CLINICAL DATA:  66 year old female presenting with left temporal lobe hemorrhage, IVH and EVD. Subsequent encounter. EXAM: CT HEAD WITHOUT CONTRAST TECHNIQUE: Contiguous axial images were obtained from the base of the skull through the vertex without intravenous contrast. RADIATION DOSE REDUCTION: This exam was performed according to the departmental dose-optimization program which includes automated exposure control, adjustment of the mA and/or kV according to patient size and/or use of iterative reconstruction technique. COMPARISON:  03/31/2022 head CT and earlier. FINDINGS: Brain: Ongoing hyperdense intra-axial hemorrhage in the left hemisphere throughout the temporal lobe. Hematoma size now 66 mm x 47 mm (including mesial temporal lobe blood) by 51 mm (AP by transverse by CC) estimated blood products now up to 79 mL, mildly progressed from prior exams. Ongoing regional edema, with perhaps greater extension into the posterior deep white matter capsules now on series 3, image 16. Rightward midline shift of 10 mm is 1 mm greater since 03/31/2022. Basilar cisterns remain patent. Stable right frontal approach EVD which terminates at the septum pellucidum and does communicate with the right lateral ventricle on coronal image 29. Moderate volume intraventricular hemorrhage mostly in the left lateral ventricle now. Clearance of blood from the 3rd and 4th ventricles. No other extra-axial extension of blood. No cortically based acute infarct identified. Vascular: Calcified atherosclerosis at the skull base. Skull: Stable right frontal burr hole. No  acute osseous abnormality identified. Sinuses/Orbits: Left nasoenteric tube remains in place. Stable sinus and mastoid aeration. Other: Minimal postsurgical changes to the superior right scalp. Mild rightward gaze but otherwise orbits appear negative. IMPRESSION: 1. Large left temporal lobe intra-axial hematoma has not regressed since 03/31/2022 and now appears slightly larger. Regional edema and rightward midline shift not significantly changed. Basilar cisterns remain patent. 2. Stable right frontal EVD and moderate volume IVH in the left lateral ventricle. Blood has cleared the 3rd and 4th ventricles since 03/31/2022. 3. No new intracranial abnormality. Electronically Signed   By: Genevie Ann M.D.   On: 04/06/2022 04:59      LOS: 9 days    Cordelia Poche, MD [facility] 04/07/2022, 12:48 PM   If 7PM-7AM, please contact night-coverage [email or web address]

## 2022-04-07 NOTE — Progress Notes (Signed)
Inpatient Rehab Admissions Coordinator:   Pt. Confused and unable to answer questions. Will need 24/7 support after CIR if she comes, so I have reached out and left voicemails with pt. Son and sister to discuss whether or not they can provide that. I will continue attempts to reach family.  Clemens Catholic, [place], [place] Admissions Coordinator  [phone] ([place]) [phone] (office)

## 2022-04-08 DIAGNOSIS — G911 Obstructive hydrocephalus: Secondary | ICD-10-CM | POA: Diagnosis not present

## 2022-04-08 DIAGNOSIS — G936 Cerebral edema: Secondary | ICD-10-CM | POA: Diagnosis not present

## 2022-04-08 DIAGNOSIS — R1310 Dysphagia, unspecified: Secondary | ICD-10-CM | POA: Diagnosis not present

## 2022-04-08 DIAGNOSIS — I611 Nontraumatic intracerebral hemorrhage in hemisphere, cortical: Secondary | ICD-10-CM | POA: Diagnosis not present

## 2022-04-08 LAB — BASIC METABOLIC PANEL
Anion gap: 11 (ref 5–15)
BUN: 19 mg/dL (ref 8–23)
CO2: 27 mmol/L (ref 22–32)
Calcium: 8.7 mg/dL — ABNORMAL LOW (ref 8.9–10.3)
Chloride: 101 mmol/L (ref 98–111)
Creatinine, Ser: 0.63 mg/dL (ref 0.44–1.00)
GFR, Estimated: >60 mL/min (ref >60)
Glucose, Bld: 100 mg/dL — ABNORMAL HIGH (ref 70–99)
Potassium: 3.3 mmol/L — ABNORMAL LOW (ref 3.5–5.1)
Sodium: 139 mmol/L (ref 135–145)

## 2022-04-08 LAB — GLUCOSE, CAPILLARY
Glucose-Capillary: 108 mg/dL — ABNORMAL HIGH (ref 70–99)
Glucose-Capillary: 136 mg/dL — ABNORMAL HIGH (ref 70–99)
Glucose-Capillary: 108 mg/dL — ABNORMAL HIGH (ref 70–99)
Glucose-Capillary: 114 mg/dL — ABNORMAL HIGH (ref 70–99)

## 2022-04-08 LAB — CBC
HCT: 37.2 % (ref 36.0–46.0)
Hemoglobin: 12.6 g/dL (ref 12.0–15.0)
MCH: 30.1 pg (ref 26.0–34.0)
MCHC: 33.9 g/dL (ref 30.0–36.0)
MCV: 88.8 fL (ref 80.0–100.0)
Platelets: 229 10*3/uL (ref 150–400)
RBC: 4.19 MIL/uL (ref 3.87–5.11)
RDW: 13.3 % (ref 11.5–15.5)
WBC: 11.6 10*3/uL — ABNORMAL HIGH (ref 4.0–10.5)
nRBC: 0 % (ref 0.0–0.2)

## 2022-04-08 NOTE — Progress Notes (Signed)
Patient not eating enough.Only 5% for breakfast and nothing for lunch. Not drinking well either. MD informed via secure chat.

## 2022-04-08 NOTE — Progress Notes (Signed)
PROGRESS NOTE    [name] Giles  HMC:[id number] DOB: Jun 20, 1956 DOA: 03/29/2022 PCP: Redmond School, MD   Brief Narrative: [name] Giles is a 66 y.o. female with a history of COPD, tobacco use and asthma. 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 Patient was admitted to the ICU for management. Ventriculostomy secondary to hydrocephalus which has improved. PT/OT recommending acute inpatient rehabilitation on discharge. Patient continues to suffer from delirium.   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 Patient was admitted to the ICU on admission under stoke service. Initial CT head (8/10) identified large temporal and frontal ICH and IVH with impending left uncal herniation and midline shift of 5 mm with 2 hour interval increase of left IPH and new IPH within the left uncus/medial temporal lobe. MRI (8/10) with similar findings in addition to increased midline shift to 7 mm and repeat CT head (8/11) significant for increased midline shift to 10 mm. CTA head/neck (8/11) significant for AVF/AVM of left temporal lobe. CT head (8/12) was significant for stabilized lesions. LDL of 110, hemoglobin A1C of 5.7% and Transthoracic Echocardiogram significant for LVEF of 60-65% with no atrial level shunt identified and no thrombus identified. PT/OT recommending acute inpatient rehabilitation.  Cerebral edema Obstructive hydrocephalus Neurosurgery consulted. Patient managed on 3% hypertonic saline from 8/10 until 8/12. Right frontal ventriculostomy placed on 8/10 by neurosurgery and was removed on 8/18.  Delirium Patient managed with Precedex in the ICU which was weaned off. 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 Neurosurgery recommending angiogram which will need to be performed  when patient is more cooperative.  Hypertension No prior known history. Patient was managed with Cleviprex IV drip while in the ICU in addition to amlodipine. Amlodipine discontinued secondary to hypotension.  Hyperlipidemia LDL of 119. Statin not started secondary to NPO status. Recommendation for high intensity statin once able to take PO.  Dysphagia Cortrak placed and tube feeds initiated. SLP on board. Patient advanced to regular diet. Patient pulled out Cortrak tube on 8/19. -Continue regular diet  Tobacco use Noted. Not started in nicotine replacement therapy at this time.  Hypokalemia Resolved with repletion.  Hypernatremia Peak sodium of 151. Complicated by NPO status. Resolved.  Hypokalemia Potassium of 3.3 on BMP this morning. -Potassium supplementation  Prediabetes Hemoglobin A1C calculated to be 5.7%.  COPD Patient is prescribed Symbicort and albuterol as an out patient, both listed as not taking. Currently no exacerbation -Continue Pulmicort and Brovana scheduled -Continue Duoneb PRN  DVT prophylaxis: Heparin subq Code Status:   Code Status: Full Code Family Communication: None at bedside Disposition Plan: Discharge likely to inpatient rehabilitation when bed is available   Consultants:  Neurology/Stroke Neurosurgery PCCM  Procedures:    Antimicrobials:     Subjective: Patient reports no issues  Objective: BP 138/61 (BP Location: Left Arm)   Pulse 84   Temp 99 F (37.2 C) (Oral)   Resp 17   Ht '5\' 5"'$  (1.651 m)   Wt 66.2 kg   SpO2 96%   BMI 24.29 kg/m   Examination:  General exam: Appears calm and comfortable Respiratory system: Respiratory effort normal. Gastrointestinal system: Abdomen is non-distended Skin: No cyanosis. No rashes   Data Reviewed: I have personally reviewed following labs and imaging studies  CBC Lab Results  Component Value Date  WBC 11.6 (H) 04/08/2022   RBC 4.19 04/08/2022   HGB 12.6 04/08/2022   HCT  37.2 04/08/2022   MCV 88.8 04/08/2022   MCH 30.1 04/08/2022   PLT 229 04/08/2022   MCHC 33.9 04/08/2022   RDW 13.3 04/08/2022   LYMPHSABS 2.3 04/05/2022   MONOABS 1.2 (H) 04/05/2022   EOSABS 0.0 04/05/2022   BASOSABS 0.0 65/78/4696     Last metabolic panel Lab Results  Component Value Date   NA 139 04/08/2022   K 3.3 (L) 04/08/2022   CL 101 04/08/2022   CO2 27 04/08/2022   BUN 19 04/08/2022   CREATININE 0.63 04/08/2022   GLUCOSE 100 (H) 04/08/2022   GFRNONAA >60 04/08/2022   GFRAA >60 12/03/2019   CALCIUM 8.7 (L) 04/08/2022   PHOS 3.4 04/02/2022   PROT 6.0 (L) 04/05/2022   ALBUMIN 3.0 (L) 04/05/2022   BILITOT 0.6 04/05/2022   ALKPHOS 72 04/05/2022   AST 20 04/05/2022   ALT 26 04/05/2022   ANIONGAP 11 04/08/2022    GFR: Estimated Creatinine Clearance: 62.2 mL/min (by C-G formula based on SCr of 0.63 mg/dL).  Recent Results (from the past 240 hour(s))  SARS Coronavirus 2 by RT PCR (hospital order, performed in [facility] hospital lab) *cepheid single result test* Anterior Nasal Swab     Status: None   Collection Time: 03/29/22  3:35 PM   Specimen: Anterior Nasal Swab  Result Value Ref Range Status   SARS Coronavirus 2 by RT PCR NEGATIVE NEGATIVE Final    Comment: (NOTE) SARS-CoV-2 target nucleic acids are NOT DETECTED.  The SARS-CoV-2 RNA is generally detectable in upper and lower respiratory specimens during the acute phase of infection. The lowest concentration of SARS-CoV-2 viral copies this assay can detect is 250 copies / mL. A negative result does not preclude SARS-CoV-2 infection and should not be used as the sole basis for treatment or other patient management decisions.  A negative result may occur with improper specimen collection / handling, submission of specimen other than nasopharyngeal swab, presence of viral mutation(s) within the areas targeted by this assay, and inadequate number of viral copies (<250 copies / mL). A negative result must be  combined with clinical observations, patient history, and epidemiological information.  Fact Sheet for Patients:   https://www.patel.info/  Fact Sheet for Healthcare Providers: https://hall.com/  This test is not yet approved or  cleared by the [place] FDA and has been authorized for detection and/or diagnosis of SARS-CoV-2 by FDA under an Emergency Use Authorization (EUA).  This EUA will remain in effect (meaning this test can be used) for the duration of the COVID-19 declaration under Section 564(b)(1) of the Act, 21 U.S.C. section 360bbb-3(b)(1), unless the authorization is terminated or revoked sooner.  Performed at [facility], [address] [place], [place] [address]   MRSA Next Gen by PCR, Nasal     Status: None   Collection Time: 03/29/22  8:40 PM   Specimen: Nasal Mucosa; Nasal Swab  Result Value Ref Range Status   MRSA by PCR Next Gen NOT DETECTED NOT DETECTED Final    Comment: (NOTE) The GeneXpert MRSA Assay (FDA approved for NASAL specimens only), is one component of a comprehensive MRSA colonization surveillance program. It is not intended to diagnose MRSA infection nor to guide or monitor treatment for MRSA infections. Test performance is not FDA approved in patients less than 70 years old. Performed at [facility], [place] [address] [place], [place] [address]  Radiology Studies: DG CHEST PORT 1 VIEW  Result Date: 04/07/2022 CLINICAL DATA:  Fever. EXAM: PORTABLE CHEST 1 VIEW COMPARISON:  03/29/2022. FINDINGS: Cardiac silhouette is normal in size. Normal mediastinal and hilar contours. Nasogastric tube passes below the diaphragm. Lungs hyperexpanded, but clear. No pleural effusion or pneumothorax. Skeletal structures are grossly intact. IMPRESSION: No active disease. Electronically Signed   By: Lajean Manes M.D.   On: 04/07/2022 10:18   DG Swallowing Func-Speech Pathology  Result Date:  04/06/2022 Table formatting from the original result was not included. Objective Swallowing Evaluation: Type of Study: MBS-Modified Barium Swallow Study  Patient Details Name: MARABELLA POPIEL MRN: [id number] Date of Birth: [birth year] Today's Date: 04/06/2022 Time: SLP Start Time (ACUTE ONLY): 1336 -SLP Stop Time (ACUTE ONLY): 1352 SLP Time Calculation (min) (ACUTE ONLY): 16 min Past Medical History: Past Medical History: Diagnosis Date  Asthma   Medical history non-contributory  Past Surgical History: Past Surgical History: Procedure Laterality Date  COLONOSCOPY    COLONOSCOPY N/A 04/06/2013  Procedure: COLONOSCOPY;  Surgeon: Daneil Dolin, MD;  Location: [facility];  Service: Endoscopy;  Laterality: N/A;  9:30 AM  ENDOMETRIAL ABLATION    IR ANGIO INTRA EXTRACRAN SEL INTERNAL CAROTID BILAT MOD SED  04/04/2022  IR US GUIDE VASC ACCESS RIGHT  04/04/2022 HPI: Pt is a 66 y/o female who presented with headache, N/V, and AMS. CTH revealed a large L frontotemporal ICH with IVH and 43m midline shift. Repeat head CT with increasing hemorrhage and associated hydrocephalus. Pt s/p ventriculostomy 8/10. PMH: COPD, tobacco abuse, asthma.  No data recorded  Recommendations for follow up therapy are one component of a multi-disciplinary discharge planning process, led by the attending physician.  Recommendations may be updated based on patient status, additional functional criteria and insurance authorization. Assessment / Plan / Recommendation   04/06/2022   2:33 PM Clinical Impressions Clinical Impression Pt's oropharyngeal swallow mechanism is within functional limits. Decreased bolus cohesion was noted once when pt filled her oral cavity with 4oz of thin liquids; premature spillage was noted thereafter with transient penetration (PAS 2). However this depth of laryngeal invasion is considered to be WNL and this was not replicated with smaller boluses. Coughing was observed during the study as was noted at bedside; however, no  instances of dysfunctional penetration or aspiration were demonstrated at these times and coughing is therefore believed to be unrelated to swallowing. Pt was able to swallow a 128mbarium tablet with thin liquids. Additional boluses of thin liquids were necesssary to facilitate trasport through the cervical and upper thoracic esophagus, but SLP suspects that the presence of the Cortrak impacted this. A regular texture diet with thin liquids is recommended at this time and SLP will follow briefly for swallowing to ensure tolerance. SLP Visit Diagnosis Dysphagia, unspecified (R13.10) Impact on safety and function No limitations     04/06/2022   2:33 PM Treatment Recommendations Treatment Recommendations Therapy as outlined in treatment plan below     04/06/2022   2:33 PM Prognosis Prognosis for Safe Diet Advancement Good Barriers to Reach Goals Language deficits;Cognitive deficits   04/06/2022   2:33 PM Diet Recommendations SLP Diet Recommendations Regular solids;Thin liquid Liquid Administration via Cup;Straw Medication Administration Whole meds with liquid Compensations Minimize environmental distractions;Slow rate Postural Changes Seated upright at 90 degrees     04/06/2022   2:33 PM Other Recommendations Oral Care Recommendations Oral care BID Follow Up Recommendations Acute inpatient rehab (3hours/day) Assistance recommended at discharge Frequent or constant Supervision/Assistance Functional  Status Assessment Patient has had a recent decline in their functional status and demonstrates the ability to make significant improvements in function in a reasonable and predictable amount of time.   04/06/2022   2:33 PM Frequency and Duration  Speech Therapy Frequency (ACUTE ONLY) min 2x/week Treatment Duration 2 weeks     04/06/2022   2:33 PM Oral Phase Oral Phase [facility]    04/06/2022   2:33 PM Pharyngeal Phase Pharyngeal Phase [facility]    04/06/2022   2:33 PM Cervical Esophageal Phase  Cervical Esophageal Phase [facility] Shanika I. Hardin Negus,  [place], [place] Office number [phone] Horton Marshall 04/06/2022, 2:48 PM                        LOS: 10 days    Cordelia Poche, MD [facility] 04/08/2022, 8:47 AM   If 7PM-7AM, please contact night-coverage [email or web address]

## 2022-04-08 NOTE — Progress Notes (Addendum)
STROKE TEAM PROGRESS NOTE   INTERVAL HISTORY Afebrile overnight. Neuro exam stable, she is speaking in word salad, does not make any sense. Intermittently following commands. She is in lap belt for safety as she attempts unsafe bed exit and is difficult to redirect d/t receptive aphasia.   Vitals:   04/08/22 0329 04/08/22 0400 04/08/22 0500 04/08/22 0600  BP: (!) 158/67     Pulse: 85     Resp: '16 18 17 17  '$ Temp: 98 F (36.7 C)     TempSrc: Axillary     SpO2: 91%     Weight:      Height:       CBC:  Recent Labs  Lab 04/05/22 0707 04/06/22 0234 04/07/22 0131  WBC 10.1 13.6* 13.8*  NEUTROABS 6.4  --   --   HGB 13.3 12.2 13.5  HCT 39.4 37.4 40.1  MCV 91.4 91.9 91.1  PLT 190 178 193    Basic Metabolic Panel:  Recent Labs  Lab 04/02/22 0602 04/02/22 1200 04/05/22 0707 04/05/22 1646 04/07/22 0131  NA 153*   < > 148* 150* 145  K 3.3*   < > 3.7 3.8 3.5  CL 121*   < > 117* 115* 107  CO2 27   < > '25 28 29  '$ GLUCOSE 141*   < > 134* 162* 99  BUN 26*   < > 33* 39* 25*  CREATININE 0.43*   < > 0.67 0.81 0.61  CALCIUM 8.7*   < > 8.8* 9.4 9.2  MG 2.1  --  2.3  --   --   PHOS 3.4  --   --   --   --    < > = values in this interval not displayed.    Lipid Panel:  No results for input(s): "CHOL", "TRIG", "HDL", "CHOLHDL", "VLDL", "LDLCALC" in the last 168 hours.  HgbA1c:  No results for input(s): "HGBA1C" in the last 168 hours.  Urine Drug Screen:  No results for input(s): "LABOPIA", "COCAINSCRNUR", "LABBENZ", "AMPHETMU", "THCU", "LABBARB" in the last 168 hours.   Alcohol Level  No results for input(s): "ETH" in the last 168 hours.   IMAGING past 24 hours DG CHEST PORT 1 VIEW  Result Date: 04/07/2022 CLINICAL DATA:  Fever. EXAM: PORTABLE CHEST 1 VIEW COMPARISON:  03/29/2022. FINDINGS: Cardiac silhouette is normal in size. Normal mediastinal and hilar contours. Nasogastric tube passes below the diaphragm. Lungs hyperexpanded, but clear. No pleural effusion or  pneumothorax. Skeletal structures are grossly intact. IMPRESSION: No active disease. Electronically Signed   By: Lajean Manes M.D.   On: 04/07/2022 10:18    PHYSICAL EXAM GEN- No distress, seen in bed Cardiovascular- no edema, RRR Respiratory- no sob, lungs clear Neuro: She is alert, able to state her name, but no other words make sense, words are clear, but she speaks in a word salad. Not able to name objects. Will repeat phrases at times. Unable to follow verbal command, but with mimicking, she can do simple commands intermittently. Lateral gaze intact. PERRL, tongue midline, face symmetric at rest and smiling. She intermittently follows commands and lifts BUE and BLE against gravity. RUE 4/5 strength compared to LUE. Sensation intact throughout.     ASSESSMENT/PLAN 66 year old female with history of COPD, smoker admitted for headache, nausea, vomiting, found down in bathroom with decreased level consciousness.  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  CT repeat showed mild increase in  size. NSGY consulted. Dr. Arnoldo Morale on board. Status post EVD. MRI with and without contrast showed 68 cc ICH, with IVH and midline shift 71m.  Repeat CT showed stable hematoma but midline shift up to 1 cm.  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  CTV no this thrombosis.  EF 60 to 65%, LDL 119, A1c 5.7, UDS negative. Fluent speech, lacking comprehension, and able to repeat. Given mixed findings including intermittent comprehension, clinical picture most consistent with improving global aphasia.   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  Impending left uncal herniation.  Midline shift 5 mm Repeat HCT 2 hours after Interval increase in size of Left IPH. New IPH within the left uncus/medial temporal lobe.  Last CT repeat  8/18 shows stable hematoma with midline shift 1 cm MRI  03/29/22 - 68 mL intraparenchymal hemorrhage centered at the left temporal lobe, similar in size as compared to prior CT. midline shift 7 mm CT repeat 03/30/22 stable hematoma but increased midline shift to 1 cm CTA Head and Neck Cluster of abnormal, tortuous vessels along the lateral aspect of the left temporal lobe compatible with an AVF for AVM.  CT venogram negative for venous thrombosis Repeat HCT 8/12 showed Stable hematoma and ventricle size, stable intracranial mass effect with up to 9 mm MLS.  CT head 8/18: stable hematoma and EVD.  Blood has cleared the 3rd and 4th ventricles since 03/31/2022. 2D Echo EF 60-65%, No shunt or thrombus detected LDL 119 HgbA1c 5.7 VTE prophylaxis - Heparin subcu No antiplatelet or anticoagulants prior to admission per chart review, now on no antithrombotics due to [place] Therapy recommendations: CIR.  Disposition:  TBD  Cerebral Edema Obstructive hydrocephalus 8/12: stopped 3% HTS Na 148 this AM EVD placed. NSG is following.  8/18 Ventriculostomy removed.  neuro checks Q4h CCM s/o-> now hospitalitis   Agitated delirium Stopped low-dose precedex.  Discontinued ativan  Recommend minimizing benzo use. Sundowning: Administer seroquel earlier (changed from 11pm->8pm).  Can consider discontinuing seroquel if able  Left temporal AVF/ AVM Neurosurgery following CTA more clearly showing this as likely source of hemorrhage Per NSGY, angiogram attempted 8/16 however was aborted and was nondiagnostic and limited by patient movement.  Repeat angiogram planned for early this week (Mon/Tue?) with Dr. NKathyrn Sheriffwhen mental status improves or will consider angio with anesthesia prior to dc  HTN No hx of hypertension or home BP meds prior to admission Stable now Stopped norvasc '10mg'$  in the setting of hypotensive events overnight 8/19. Can resume if BP increases.  Systolic BP <<638 Long term BP goal  normotensive.  Hyperlipidemia Home meds: None  LDL 119, goal < 70 On Crestor 20 Continue statin on discharge  Dysphagia Core track was removed 8/18 Passed MBS, recommended regular solid, thin liquid diet.  Urinary retention Foley catheter in place, high risk for urinary retention due to AMS Had bladder scan with 1526movernight, given LR 50054molus On bethanechol '10mg'$  TID   Tobacco abuse Current smoker 1PPD, 30 pack year hx Smoking cessation counseling will be provided  Other Stroke Risk Factors Advanced Age >/= 65 59urrent ETOH use, alcohol level <10, will be advised to drink no more than 1 drink a day Incidentally noted previous cerebellar stroke   Hypokalemia Potassium repleted. Continue to monitor   Other Active Problems COPD/asthma Fever- now resolved Suspect  due to irritation from hemorrhage  Cxr unremarkable  Afebrile w/in last 48 hrs   Hospital day # [place], ARNP-C, ANVP-BC Pager: [phone]   ATTENDING NOTE: I reviewed above note and agree with the assessment and plan. Pt was seen and examined.   No family at bedside.  No acute event overnight.  Patient neuro stable, unchanged from yesterday.  BP stable, pending CIR, pending further cerebral angiogram for AVM evaluation.  Continue current management.  We will follow  For detailed assessment and plan, please refer to above/below as I have made changes wherever appropriate.   Rosalin Hawking, MD PhD Stroke Neurology 04/08/2022 4:49 PM    To contact Stroke Continuity provider, please refer to [email or web address] After hours, contact General Neurology

## 2022-04-09 DIAGNOSIS — Q282 Arteriovenous malformation of cerebral vessels: Secondary | ICD-10-CM | POA: Diagnosis not present

## 2022-04-09 DIAGNOSIS — R1310 Dysphagia, unspecified: Secondary | ICD-10-CM | POA: Diagnosis not present

## 2022-04-09 DIAGNOSIS — G911 Obstructive hydrocephalus: Secondary | ICD-10-CM | POA: Diagnosis not present

## 2022-04-09 DIAGNOSIS — G936 Cerebral edema: Secondary | ICD-10-CM | POA: Diagnosis not present

## 2022-04-09 DIAGNOSIS — I611 Nontraumatic intracerebral hemorrhage in hemisphere, cortical: Secondary | ICD-10-CM | POA: Diagnosis not present

## 2022-04-09 LAB — BASIC METABOLIC PANEL
Anion gap: 8 (ref 5–15)
BUN: 17 mg/dL (ref 8–23)
CO2: 29 mmol/L (ref 22–32)
Calcium: 8.6 mg/dL — ABNORMAL LOW (ref 8.9–10.3)
Chloride: 101 mmol/L (ref 98–111)
Creatinine, Ser: 0.57 mg/dL (ref 0.44–1.00)
GFR, Estimated: >60 mL/min (ref >60)
Glucose, Bld: 95 mg/dL (ref 70–99)
Potassium: 3.1 mmol/L — ABNORMAL LOW (ref 3.5–5.1)
Sodium: 138 mmol/L (ref 135–145)

## 2022-04-09 LAB — CBC
HCT: 38 % (ref 36.0–46.0)
Hemoglobin: 12.6 g/dL (ref 12.0–15.0)
MCH: 29.7 pg (ref 26.0–34.0)
MCHC: 33.2 g/dL (ref 30.0–36.0)
MCV: 89.6 fL (ref 80.0–100.0)
Platelets: 223 10*3/uL (ref 150–400)
RBC: 4.24 MIL/uL (ref 3.87–5.11)
RDW: 13.2 % (ref 11.5–15.5)
WBC: 12.2 10*3/uL — ABNORMAL HIGH (ref 4.0–10.5)
nRBC: 0 % (ref 0.0–0.2)

## 2022-04-09 LAB — GLUCOSE, CAPILLARY
Glucose-Capillary: 172 mg/dL — ABNORMAL HIGH (ref 70–99)
Glucose-Capillary: 99 mg/dL (ref 70–99)
Glucose-Capillary: 144 mg/dL — ABNORMAL HIGH (ref 70–99)

## 2022-04-09 MED ORDER — POTASSIUM CHLORIDE 20 MEQ PO PACK
40.0000 meq | PACK | Freq: Two times a day (BID) | ORAL | Status: DC
  Administered 2022-04-09 – 2022-04-16 (×14): 40 meq via ORAL
  Filled 2022-04-09 (×15): qty 2

## 2022-04-09 MED ORDER — AMLODIPINE BESYLATE 2.5 MG PO TABS
2.5000 mg | ORAL_TABLET | Freq: Every day | ORAL | Status: DC
  Administered 2022-04-09 – 2022-04-16 (×7): 2.5 mg via ORAL
  Filled 2022-04-09 (×8): qty 1

## 2022-04-09 MED ORDER — POTASSIUM CHLORIDE CRYS ER 20 MEQ PO TBCR: 40.0000 meq | EXTENDED_RELEASE_TABLET | ORAL | Status: DC

## 2022-04-09 MED ORDER — POTASSIUM CHLORIDE CRYS ER 20 MEQ PO TBCR
40.0000 meq | EXTENDED_RELEASE_TABLET | Freq: Two times a day (BID) | ORAL | Status: DC
  Filled 2022-04-09: qty 2

## 2022-04-09 MED ORDER — POTASSIUM CHLORIDE 10 MEQ/100ML IV SOLN
10.0000 meq | INTRAVENOUS | Status: DC
  Administered 2022-04-09: 10 meq via INTRAVENOUS
  Filled 2022-04-09: qty 100

## 2022-04-09 NOTE — Plan of Care (Signed)
  Problem: Education: Goal: Knowledge of General Education information will improve Description: Including pain rating scale, medication(s)/side effects and non-pharmacologic comfort measures Outcome: Progressing   Problem: Activity: Goal: Risk for activity intolerance will decrease Outcome: Progressing   Problem: Coping: Goal: Level of anxiety will decrease Outcome: Progressing   Problem: Pain Managment: Goal: General experience of comfort will improve Outcome: Progressing   Problem: Safety: Goal: Ability to remain free from injury will improve Outcome: Progressing   Problem: Education: Goal: Knowledge of disease or condition will improve Outcome: Progressing

## 2022-04-09 NOTE — Progress Notes (Addendum)
STROKE TEAM PROGRESS NOTE   INTERVAL HISTORY Afebrile overnight. BP systolics 409W. She spit out senna, seroquel, and melatonin overnight. Had BM yesterday. K 3.1 this AM, supplement K today. WBC 12.2. Per nursing, poor PO intake.   Patient seen sitting in chair with sister at bedside. Patient reporting pain with IV. IV removed by patient. K supplementation changed to PO. Patient intermittently able to name object, unable to repeat compared to prior. Patient with perseveration, when asked name or where she is, she continually repeated her DOB.   Vitals:   04/09/22 0306 04/09/22 0500 04/09/22 0600 04/09/22 0728  BP: (!) 147/58   (!) 141/61  Pulse: 87   91  Resp: '16 20 19 18  '$ Temp: 98.8 F (37.1 C)   99.9 F (37.7 C)  TempSrc: Oral   Oral  SpO2: 92%   92%  Weight:      Height:       CBC:  Recent Labs  Lab 04/05/22 0707 04/06/22 0234 04/08/22 0611 04/09/22 0355  WBC 10.1   < > 11.6* 12.2*  NEUTROABS 6.4  --   --   --   HGB 13.3   < > 12.6 12.6  HCT 39.4   < > 37.2 38.0  MCV 91.4   < > 88.8 89.6  PLT 190   < > 229 223   < > = values in this interval not displayed.   Basic Metabolic Panel:  Recent Labs  Lab 04/05/22 0707 04/05/22 1646 04/08/22 0611 04/09/22 0355  NA 148*   < > 139 138  K 3.7   < > 3.3* 3.1*  CL 117*   < > 101 101  CO2 25   < > 27 29  GLUCOSE 134*   < > 100* 95  BUN 33*   < > 19 17  CREATININE 0.67   < > 0.63 0.57  CALCIUM 8.8*   < > 8.7* 8.6*  MG 2.3  --   --   --    < > = values in this interval not displayed.   Lipid Panel:  No results for input(s): "CHOL", "TRIG", "HDL", "CHOLHDL", "VLDL", "LDLCALC" in the last 168 hours.  HgbA1c:  No results for input(s): "HGBA1C" in the last 168 hours.  Urine Drug Screen:  No results for input(s): "LABOPIA", "COCAINSCRNUR", "LABBENZ", "AMPHETMU", "THCU", "LABBARB" in the last 168 hours.   Alcohol Level  No results for input(s): "ETH" in the last 168 hours.   IMAGING past 24 hours No results  found.  PHYSICAL EXAM GEN- Acutely distressed with pain from IV. IV removed by patient and patient much calmer. Seen in bed with sister at bedside.  Respiratory- Normal work of breathing  Neuro: She is alert, able to state her date of birth. Fluent speech, but with word salad. Named "pen" but unable to name other objects. Perseverated on DOB for multiple questions. Unable to follow verbal commands, but can do simple commands intermittently. Lateral gaze intact. PERRL, tongue midline, face symmetric at rest and smiling. Lifts BUE and BLE against gravity. RUE 4/5 strength compared to LUE.     ASSESSMENT/PLAN 66 year old female with history of COPD, smoker admitted for headache, nausea, vomiting, found down in bathroom with decreased level consciousness.  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  CT repeat showed mild increase in size. NSGY consulted. Dr. Arnoldo Morale on board. Status post EVD. MRI with and without contrast showed 68  cc ICH, with IVH and midline shift 22m.  Repeat CT showed stable hematoma but midline shift up to 1 cm.  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  CTV no this thrombosis.  EF 60 to 65%, LDL 119, A1c 5.7, UDS negative. Waxing and waning with fluent speech, lacking comprehension. Given mixed findings including intermittent comprehension, clinical picture most consistent with improving global aphasia.   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  Impending left uncal herniation.  Midline shift 5 mm Repeat HCT 2 hours after Interval increase in size of Left IPH. New IPH within the left uncus/medial temporal lobe.  Last CT repeat 8/18 shows stable hematoma with midline shift 1 cm MRI  03/29/22 - 68 mL intraparenchymal hemorrhage centered at the left temporal lobe, similar in size as compared to  prior CT. midline shift 7 mm CT repeat 03/30/22 stable hematoma but increased midline shift to 1 cm CTA Head and Neck Cluster of abnormal, tortuous vessels along the lateral aspect of the left temporal lobe compatible with an AVF for AVM.  CT venogram negative for venous thrombosis Repeat HCT 8/12 showed Stable hematoma and ventricle size, stable intracranial mass effect with up to 9 mm MLS.  CT head 8/18: stable hematoma and EVD.  Blood has cleared the 3rd and 4th ventricles since 03/31/2022. 2D Echo EF 60-65%, No shunt or thrombus detected LDL 119 HgbA1c 5.7 VTE prophylaxis - Heparin subcu No antiplatelet or anticoagulants prior to admission per chart review, now on no antithrombotics due to IBelle Rive Therapy recommendations: CIR.  Disposition:  TBD  Cerebral Edema Obstructive hydrocephalus 8/12: stopped 3% HTS Na 148 this AM EVD placed. NSG is following.  8/18 Ventriculostomy removed.  neuro checks Q4h CCM s/o-> now hospitalist  Agitated delirium Stopped low-dose precedex.  Discontinued ativan  Recommend minimizing benzo use. Sundowning: Administer seroquel earlier (changed from 11pm->8pm).  Can consider discontinuing seroquel if able  Left temporal AVF/ AVM Neurosurgery following CTA more clearly showing this as likely source of hemorrhage Per NSGY, angiogram attempted 8/16 however was aborted and was nondiagnostic and limited by patient movement.  Repeat angiogram initially planned before discharge from CIR per Dr. NKathyrn Sheriff However, patient now most likely need to go to SNF. Discussed with Dr. NKathyrn Sheriff he will see pt 3-4 weeks after discharge.   HTN No hx of hypertension or home BP meds prior to admission Stable now Stopped norvasc '10mg'$  in the setting of hypotensive events overnight 8/19.  8/21: restarted amlodipine 2.'5mg'$    Systolic BP <<998 Long term BP goal normotensive.  Hyperlipidemia Home meds: None  LDL 119, goal < 70 On Crestor 20 Continue statin on  discharge  Dysphagia Core track was removed 8/18 Passed MBS, recommended regular solid, thin liquid diet.  Urinary retention Foley catheter in place, high risk for urinary retention due to AMS Had bladder scan with 1558movernight, given LR 5006molus On bethanechol '10mg'$  TID   Tobacco abuse Current smoker 1PPD, 30 pack year hx Smoking cessation counseling will be provided  Other Stroke Risk Factors Advanced Age >/= 65 88urrent ETOH use, alcohol level <10, will be advised to drink no more than 1 drink a day Incidentally noted previous cerebellar stroke   Hypokalemia Initially repleting with IV K, however changed to PO due to patient pain  Repleting with Kclor x2.   Other Active  Problems COPD/asthma Fever- now resolved Suspect due to irritation from hemorrhage  Cxr unremarkable  Has been afebrile Leukocytosis WBC 13.8->11.6->12.2  Hospital day # 11  Rolanda Lundborg, MD, PGY-1   04/09/2022 7:36 AM  ATTENDING NOTE: I reviewed above note and agree with the assessment and plan. Pt was seen and examined.   Sister at the bedside. Pt lying in chair, awake alert, still has word salad, global aphasia but more perseveration today. Moving all extremities. K was low, on IV supplement but painful infusion, now changed to K packets. Repeat angiogram initially planned before discharge from CIR per Dr. Kathyrn Sheriff. However, patient now most likely need to go to SNF. Discussed with Dr. Kathyrn Sheriff, he will see pt 3-4 weeks after discharge. Dr. Lonny Prude made aware.   For detailed assessment and plan, please refer to above/below as I have made changes wherever appropriate.   Neurology will sign off. Please call with questions. Pt will follow up with stroke clinic NP at [facility] in about 4 weeks. Thanks for the consult.   Rosalin Hawking, MD PhD Stroke Neurology 04/09/2022 2:33 PM    To contact Stroke Continuity provider, please refer to [email or web address] After hours, contact General Neurology

## 2022-04-09 NOTE — NC FL2 (Signed)
[place] LEVEL OF CARE SCREENING TOOL     IDENTIFICATION  Patient Name: [name] Giles Birthdate: Jan 09, 1956 Sex: female Admission Date (Current Location): 03/29/2022  [facility] [place] Number:  Herbalist and Address:  [facility] [place] [place] [address] [place], [place] [address]      Provider Number: [id number]  Attending Physician Name and Address:  Mariel Aloe, MD  Relative Name and Phone Number:  NLGXQ,JJHER Sister     [phone]    Current Level of Care: Hospital Recommended Level of Care: [place] Prior Approval Number:    Date Approved/Denied:   PASRR Number: [id number]  Discharge Plan: SNF    Current Diagnoses: Patient Active Problem List   Diagnosis Date Noted   Intracerebral hemorrhage ([place]) 03/29/2022    Orientation RESPIRATION BLADDER Height & Weight     Self  Normal Incontinent, External catheter Weight: 145 lb 15.1 oz (66.2 kg) Height:  '5\' 5"'$  (165.1 cm)  BEHAVIORAL SYMPTOMS/MOOD NEUROLOGICAL BOWEL NUTRITION STATUS      Incontinent Diet (See d/c summary)  AMBULATORY STATUS COMMUNICATION OF NEEDS Skin   Extensive Assist Verbally Surgical wounds (incision groin; incision right head)                       Personal Care Assistance Level of Assistance  Bathing, Feeding, Dressing Bathing Assistance: Maximum assistance Feeding assistance: Independent Dressing Assistance: Maximum assistance     Functional Limitations Info  Sight, Hearing, Speech Sight Info: Adequate Hearing Info: Adequate Speech Info: Adequate    SPECIAL CARE FACTORS FREQUENCY  PT (By licensed PT), OT (By licensed OT)     PT Frequency: 5x/week OT Frequency: 5x/week            Contractures Contractures Info: Not present    Additional Factors Info  Code Status, Allergies Code Status Info: Full code Allergies Info: no known allergies           Current Medications (04/09/2022):  This is the  current hospital active medication list Current Facility-Administered Medications  Medication Dose Route Frequency Provider Last Rate Last Admin   (feeding supplement) PROSource Plus liquid 30 mL  30 mL Oral BID BM Mariel Aloe, MD   30 mL at 04/09/22 1024   acetaminophen (TYLENOL) tablet 650 mg  650 mg Oral Q4H PRN Amie Portland, MD   650 mg at 04/08/22 1247   Or   acetaminophen (TYLENOL) 160 MG/5ML solution 650 mg  650 mg Per Tube Q4H PRN Amie Portland, MD   650 mg at 04/06/22 0908   Or   acetaminophen (TYLENOL) suppository 650 mg  650 mg Rectal Q4H PRN Amie Portland, MD   650 mg at 03/29/22 2157   amLODipine (NORVASC) tablet 2.5 mg  2.5 mg Oral Daily Mariel Aloe, MD   2.5 mg at 04/09/22 1026   arformoterol (BROVANA) nebulizer solution 15 mcg  15 mcg Nebulization BID Kipp Brood, MD   15 mcg at 04/09/22 0825   bethanechol (URECHOLINE) tablet 10 mg  10 mg Oral TID Mariel Aloe, MD   10 mg at 04/09/22 1026   budesonide (PULMICORT) nebulizer solution 0.25 mg  0.25 mg Nebulization BID Kipp Brood, MD   0.25 mg at 04/09/22 3785   Chlorhexidine Gluconate Cloth 2 % PADS 6 each  6 each Topical Daily Amie Portland, MD   6 each at 04/09/22 1000   haloperidol lactate (HALDOL) injection 1 mg  1  mg Intravenous Q30 min PRN Kipp Brood, MD       heparin injection 5,000 Units  5,000 Units Subcutaneous Q8H Garvin Fila, MD   5,000 Units at 04/09/22 0603   hydrALAZINE (APRESOLINE) injection 20 mg  20 mg Intravenous Q6H PRN Bailey-Modzik, Delila A, NP   20 mg at 04/02/22 2241   iohexol (OMNIPAQUE) 300 MG/ML solution 100 mL  100 mL Intra-arterial Once PRN [name], MD       ipratropium-albuterol (DUONEB) 0.5-2.5 (3) MG/3ML nebulizer solution 3 mL  3 mL Nebulization Q6H PRN Collene Gobble, MD   3 mL at 04/03/22 1303   melatonin tablet 3 mg  3 mg Oral QHS Kipp Brood, MD   3 mg at 04/07/22 2107   Oral care mouth rinse  15 mL Mouth Rinse 4 times per day Amie Portland, MD   15 mL at  04/09/22 0800   Oral care mouth rinse  15 mL Mouth Rinse PRN Amie Portland, MD       oxyCODONE (Oxy IR/ROXICODONE) immediate release tablet 5 mg  5 mg Oral Q4H PRN Mariel Aloe, MD   5 mg at 04/07/22 1829   potassium chloride (KLOR-CON) packet 40 mEq  40 mEq Oral BID Reome, Earle J, RPH   40 mEq at 04/09/22 1131   QUEtiapine (SEROQUEL) tablet 25 mg  25 mg Oral QPM Mariel Aloe, MD   25 mg at 04/07/22 2100   revefenacin (YUPELRI) nebulizer solution 175 mcg  175 mcg Nebulization Daily Kipp Brood, MD   175 mcg at 04/09/22 0826   rosuvastatin (CRESTOR) tablet 20 mg  20 mg Oral Daily Rosalin Hawking, MD   20 mg at 04/09/22 1026   senna-docusate (Senokot-S) tablet 1 tablet  1 tablet Oral BID Mariel Aloe, MD   1 tablet at 04/09/22 1027   sodium chloride flush (NS) 0.9 % injection 10-40 mL  10-40 mL Intracatheter Q12H Newman Pies, MD   10 mL at 04/09/22 1027   sodium chloride flush (NS) 0.9 % injection 10-40 mL  10-40 mL Intracatheter PRN Newman Pies, MD   20 mL at 04/03/22 0935     Discharge Medications: Please see discharge summary for a list of discharge medications.  Relevant Imaging Results:  Relevant Lab Results:   Additional Information SSN [place], [place]

## 2022-04-09 NOTE — TOC Initial Note (Addendum)
Transition of Care [facility]) - Initial/Assessment Note    Patient Details  Name: IRLENE CRUDUP MRN: [id number] Date of Birth: [birth year]  [facility]) CM/SW Contact:    Bethann Berkshire, [place] Phone Number: 04/09/2022, 11:37 AM  Clinical Narrative:                  CSW informed that CIR is unable to accept pt due to lacking 24/7 supervision at home. CSW called pt's sister to discuss disposition. Sister reports that pt lives at home with her adult son in [place] though son is working a new job. Sister lives in [place] and says she would be unable to have pt come to her house.  CSW explained SNF workup and insurance auth process. Sister is considering working with family to arrange 24/7 care for possible CIR though states pt will most likely need to go to an outside SNF facility. Pt has commercial [employer]; sister is unsure if pt enrolled in medicare. Sister will see what she can find out regarding medicare though it sounds like she has not enrolled yet. CSW complete fl2 and faxed bed requests in hub.   1230: CSW received call from pt's son who spoke with pt's sister after initial call with CSW. CSW provided update to son. He is considering arranging 24/7 supervision post-discharge for pt to possibly be considered for CIR. Son provides CSW with correct mobile phone # [phone] which CSW corrected in chart.   1615: CSW received call from pt's sister who found pt's medicare card. She provided # [id number]. CSW requested that sister bring card to be scanned in admitting when she visits pt tomorrow which she agreed to do.   Expected Discharge Plan: Skilled Nursing Facility Barriers to Discharge: Continued Medical Work up, Ship broker, SNF Pending bed offer   Patient Goals and CMS Choice        Expected Discharge Plan and Services Expected Discharge Plan: [place] arrangements for the past 2 months: Single Family Home                                       Prior Living Arrangements/Services Living arrangements for the past 2 months: Single Family Home Lives with:: Adult Children Patient language and need for interpreter reviewed:: Yes        Need for Family Participation in Patient Care: Yes (Comment) Care giver support system in place?: Yes (comment)   Criminal Activity/Legal Involvement Pertinent to Current Situation/Hospitalization: No - Comment as needed  Activities of Daily Living Home Assistive Devices/Equipment: None ADL Screening (condition at time of admission) Patient's cognitive ability adequate to safely complete daily activities?: No Is the patient deaf or have difficulty hearing?: No Does the patient have difficulty seeing, even when wearing glasses/contacts?: No Does the patient have difficulty concentrating, remembering, or making decisions?: Yes Patient able to express need for assistance with ADLs?: No Does the patient have difficulty dressing or bathing?: Yes Independently performs ADLs?: No Communication: Dependent Is this a change from baseline?: Change from baseline, expected to last >3 days Dressing (OT): Dependent Is this a change from baseline?: Change from baseline, expected to last >3 days Grooming: Dependent Is this a change from baseline?: Change from baseline, expected to last >3 days Feeding: Dependent Is this a change from baseline?: Change from baseline, expected to last >3 days Bathing: Dependent  Is this a change from baseline?: Change from baseline, expected to last >3 days Toileting: Dependent Is this a change from baseline?: Change from baseline, expected to last >3days In/Out Bed: Dependent Is this a change from baseline?: Change from baseline, expected to last >3 days Walks in Home: Dependent Is this a change from baseline?: Change from baseline, expected to last >3 days Does the patient have difficulty walking or climbing stairs?: Yes Weakness of Legs:  Right Weakness of Arms/Hands: Right  Permission Sought/Granted                  Emotional Assessment       Orientation: : Oriented to Self Alcohol / Substance Use: Not Applicable Psych Involvement: No (comment)  Admission diagnosis:  Intracerebral hemorrhage [facility]) [I61.9] Patient Active Problem List   Diagnosis Date Noted   Intracerebral hemorrhage ([place]) 03/29/2022   PCP:  Redmond School, MD Pharmacy:   [facility] [place], [place] [address] [place] [address] Phone: [phone] Fax: [phone]  [facility] [address] [place] - [address] [place] [address] [place] [address] Phone: [phone] Fax: [phone]     Social Determinants of Health (SDOH) Interventions    Readmission Risk Interventions     No data to display

## 2022-04-09 NOTE — Plan of Care (Signed)
  Problem: Clinical Measurements: Goal: Will remain free from infection Outcome: Progressing Goal: Respiratory complications will improve Outcome: Progressing Goal: Cardiovascular complication will be avoided Outcome: Progressing   Problem: Elimination: Goal: Will not experience complications related to bowel motility Outcome: Progressing Goal: Will not experience complications related to urinary retention Outcome: Progressing   Problem: Pain Managment: Goal: General experience of comfort will improve Outcome: Progressing   Problem: Safety: Goal: Ability to remain free from injury will improve Outcome: Progressing   Problem: Skin Integrity: Goal: Risk for impaired skin integrity will decrease Outcome: Progressing   Problem: Coping: Goal: Will identify appropriate support needs Outcome: Progressing   Problem: Nutrition: Goal: Risk of aspiration will decrease Outcome: Progressing

## 2022-04-09 NOTE — Progress Notes (Signed)
Occupational Therapy Treatment Patient Details Name: [name] Giles MRN: [id number] DOB: 11-04-1955 Today's Date: 04/09/2022   History of present illness [name] Giles is a 66 y.o. female presenting 8/10 after being found on the bathroom at work, head CT revealed a large left frontotemporal ICH with IVH and 5 mm midline shift and ballooning of the temporal horns concerning for obstructive hydrocephalus s/p placement of right frontal ventriculostomy via bur hole 8/10. EVD removed 8/18. PHMx: COPD, tobacco abuse and asthma.   OT comments  Patient received in supine and agreeable to OT treatment. Patient able to get to EOB with verbal cues and min guard assist. Patient was min assist to stand from EOB and ambulate to sink for grooming and patient asked to use bathroom and attempted to walk without assistance and cued on safety. Patient returned to sink for grooming, LB bathing, and gown change. Patient was setup for self feeding and began feeding self.  Patient making slow progress but has potential for increased gains with continued OT treatment.    Recommendations for follow up therapy are one component of a multi-disciplinary discharge planning process, led by the attending physician.  Recommendations may be updated based on patient status, additional functional criteria and insurance authorization.    Follow Up Recommendations  Acute inpatient rehab (3hours/day)    Assistance Recommended at Discharge Frequent or constant Supervision/Assistance  Patient can return home with the following  A lot of help with walking and/or transfers;A lot of help with bathing/dressing/bathroom;Assistance with cooking/housework;Direct supervision/assist for medications management;Direct supervision/assist for financial management;Assist for transportation;Help with stairs or ramp for entrance   Equipment Recommendations  BSC/3in1    Recommendations for Other Services      Precautions / Restrictions  Precautions Precautions: Fall Precaution Comments: posey belt Restrictions Weight Bearing Restrictions: No       Mobility Bed Mobility Overal bed mobility: Needs Assistance Bed Mobility: Supine to Sit     Supine to sit: Min guard     General bed mobility comments: verbal cues to initate    Transfers Overall transfer level: Needs assistance Equipment used: Rolling walker (2 wheels) Transfers: Sit to/from Stand Sit to Stand: Min assist           General transfer comment: min assist to riseand to manage RW     Balance Overall balance assessment: Needs assistance Sitting-balance support: Feet supported, No upper extremity supported Sitting balance-Leahy Scale: Fair Sitting balance - Comments: fair sitting balance on EOB preparing to stand   Standing balance support: Bilateral upper extremity supported Standing balance-Leahy Scale: Poor Standing balance comment: one extremity support while performing grooming                           ADL either performed or assessed with clinical judgement   ADL Overall ADL's : Needs assistance/impaired Eating/Feeding: Set up   Grooming: Minimal assistance;Brushing hair;Wash/dry hands;Wash/dry face;Oral care Grooming Details (indicate cue type and reason): min assist for safety and min verbal cues for sequencing     Lower Body Bathing: Moderate assistance;Sit to/from stand Lower Body Bathing Details (indicate cue type and reason): for peri area cleaning Upper Body Dressing : Minimal assistance Upper Body Dressing Details (indicate cue type and reason): to change gown Lower Body Dressing: Moderate assistance Lower Body Dressing Details (indicate cue type and reason): to donn socks Toilet Transfer: Minimal assistance;Ambulation;Regular Glass blower/designer Details (indicate cue type and reason): verbal cues for safety and min assist  for balance and walker use Toileting- Clothing Manipulation and Hygiene: Moderate  assistance         General ADL Comments: requires frequent cues to follow directions and for safety    Extremity/Trunk Assessment              Vision       Perception     Praxis      Cognition Arousal/Alertness: Awake/alert Behavior During Therapy: Flat affect, Impulsive Overall Cognitive Status: Impaired/Different from baseline Area of Impairment: Orientation, Attention, Memory, Following commands, Safety/judgement, Awareness, Problem solving                 Orientation Level: Disoriented to, Place, Time, Situation Current Attention Level: Sustained Memory: Decreased short-term memory Following Commands: Follows one step commands inconsistently Safety/Judgement: Decreased awareness of safety, Decreased awareness of deficits Awareness: Intellectual Problem Solving: Slow processing, Decreased initiation, Difficulty sequencing General Comments: requires frequent cues to follow directions and for safety        Exercises      Shoulder Instructions       General Comments      Pertinent Vitals/ Pain       Pain Assessment Pain Assessment: Faces Faces Pain Scale: Hurts a little bit Pain Location: generalized with movement Pain Descriptors / Indicators: Discomfort, Grimacing Pain Intervention(s): Monitored during session, Repositioned  Home Living                                          Prior Functioning/Environment              Frequency  Min 2X/week        Progress Toward Goals  OT Goals(current goals can now be found in the care plan section)  Progress towards OT goals: Progressing toward goals  Acute Rehab OT Goals Patient Stated Goal: get better OT Goal Formulation: With patient Time For Goal Achievement: 04/16/22 Potential to Achieve Goals: Good ADL Goals Pt Will Perform Grooming: with min guard assist;standing Pt Will Perform Upper Body Dressing: with supervision;sitting Pt Will Perform Lower Body Dressing:  with min assist;sit to/from stand Pt Will Transfer to Toilet: with min guard assist;ambulating;regular height toilet  Plan Discharge plan remains appropriate    Co-evaluation                 AM-PAC OT "6 Clicks" Daily Activity     Outcome Measure   Help from another person eating meals?: A Little Help from another person taking care of personal grooming?: A Little Help from another person toileting, which includes using toliet, bedpan, or urinal?: A Lot Help from another person bathing (including washing, rinsing, drying)?: A Lot Help from another person to put on and taking off regular upper body clothing?: A Lot Help from another person to put on and taking off regular lower body clothing?: A Lot 6 Click Score: 14    End of Session Equipment Utilized During Treatment: Gait belt;Rolling walker (2 wheels)  OT Visit Diagnosis: Unsteadiness on feet (R26.81);Other abnormalities of gait and mobility (R26.89);Muscle weakness (generalized) (M62.81);Other symptoms and signs involving the nervous system (R29.898);Other symptoms and signs involving cognitive function   Activity Tolerance Patient tolerated treatment well   Patient Left in chair;with chair alarm set;with restraints reapplied   Nurse Communication Mobility status        Time: 6213-0865 OT Time Calculation (min): 34 min  Charges: OT General  Charges $OT Visit: 1 Visit OT Treatments $Self Care/Home Management : 23-37 mins  Lodema Hong, [place]  Office [place] 04/09/2022, 8:27 AM

## 2022-04-09 NOTE — Progress Notes (Signed)
PROGRESS NOTE    [name]  CBJ:[id number] DOB: 07/24/1956 DOA: 03/29/2022 PCP: Redmond School, MD   Brief Narrative: [name] Giles is a 66 y.o. female with a history of COPD, tobacco use and asthma. 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 Patient was admitted to the ICU for management. Ventriculostomy secondary to hydrocephalus which has improved. PT/OT recommending acute inpatient rehabilitation on discharge. Patient continues to suffer from delirium.   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 Patient was admitted to the ICU on admission under stoke service. Initial CT head (8/10) identified large temporal and frontal ICH and IVH with impending left uncal herniation and midline shift of 5 mm with 2 hour interval increase of left IPH and new IPH within the left uncus/medial temporal lobe. MRI (8/10) with similar findings in addition to increased midline shift to 7 mm and repeat CT head (8/11) significant for increased midline shift to 10 mm. CTA head/neck (8/11) significant for AVF/AVM of left temporal lobe. CT head (8/12) was significant for stabilized lesions. LDL of 110, hemoglobin A1C of 5.7% and Transthoracic Echocardiogram significant for LVEF of 60-65% with no atrial level shunt identified and no thrombus identified. PT/OT recommending acute inpatient rehabilitation.  Cerebral edema Obstructive hydrocephalus Neurosurgery consulted. Patient managed on 3% hypertonic saline from 8/10 until 8/12. Right frontal ventriculostomy placed on 8/10 by neurosurgery and was removed on 8/18.  Delirium Patient managed with Precedex in the ICU which was weaned off. 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 Neurosurgery recommending angiogram which will need to be performed  when patient is more cooperative.  Hypertension No prior known history. Patient was managed with Cleviprex IV drip while in the ICU in addition to amlodipine. Amlodipine discontinued secondary to hypotension. Blood pressure increased. -Amlodipine 2.5 mg daily  Hyperlipidemia LDL of 119. Statin not initially started secondary to NPO status. -Crestor 20 mg daily  Dysphagia Cortrak placed and tube feeds initiated. SLP on board. Patient advanced to regular diet. Patient pulled out Cortrak tube on 8/19. -Continue regular diet  Tobacco use Noted. Not started in nicotine replacement therapy at this time.  Hypokalemia Potassium down to 3.1 today. -Potassium supplementation  Hypernatremia Peak sodium of 151. Complicated by NPO status. Resolved.  Prediabetes Hemoglobin A1C calculated to be 5.7%.  COPD Patient is prescribed Symbicort and albuterol as an out patient, both listed as not taking. Currently no exacerbation -Continue Pulmicort and Brovana scheduled -Continue Duoneb PRN  DVT prophylaxis: Heparin subq Code Status:   Code Status: Full Code Family Communication: None at bedside. Called son, but no answer Disposition Plan: Discharge likely to inpatient rehabilitation when bed is available and pending improvement of delirium unless she can follow commands for therapy. Will also need to solidify plan for oral intake   Consultants:  Neurology/Stroke Neurosurgery PCCM  Procedures:    Antimicrobials:     Subjective: Patient without issues. When asked about why she is not eating well, she tells me she is just not hungry.   Objective: BP (!) 141/61 (BP Location: Right Arm)   Pulse 91   Temp 99.9 F (37.7 C) (Oral)   Resp 18   Ht '5\' 5"'$  (1.651 m)   Wt 66.2 kg   SpO2 92%   BMI 24.29 kg/m   Examination:  General exam: Appears calm and  comfortable Respiratory system: Clear to auscultation. Respiratory effort normal. Cardiovascular system: S1 & S2 heard, RRR. No  murmurs, rubs, gallops or clicks. Gastrointestinal system: Abdomen is nondistended, soft and nontender. No organomegaly or masses felt. Normal bowel sounds heard. Central nervous system: Alert. Aphasia. Follows commands. Musculoskeletal: No edema. No calf tenderness   Data Reviewed: I have personally reviewed following labs and imaging studies  CBC Lab Results  Component Value Date   WBC 12.2 (H) 04/09/2022   RBC 4.24 04/09/2022   HGB 12.6 04/09/2022   HCT 38.0 04/09/2022   MCV 89.6 04/09/2022   MCH 29.7 04/09/2022   PLT 223 04/09/2022   MCHC 33.2 04/09/2022   RDW 13.2 04/09/2022   LYMPHSABS 2.3 04/05/2022   MONOABS 1.2 (H) 04/05/2022   EOSABS 0.0 04/05/2022   BASOSABS 0.0 95/02/2256     Last metabolic panel Lab Results  Component Value Date   NA 138 04/09/2022   K 3.1 (L) 04/09/2022   CL 101 04/09/2022   CO2 29 04/09/2022   BUN 17 04/09/2022   CREATININE 0.57 04/09/2022   GLUCOSE 95 04/09/2022   GFRNONAA >60 04/09/2022   GFRAA >60 12/03/2019   CALCIUM 8.6 (L) 04/09/2022   PHOS 3.4 04/02/2022   PROT 6.0 (L) 04/05/2022   ALBUMIN 3.0 (L) 04/05/2022   BILITOT 0.6 04/05/2022   ALKPHOS 72 04/05/2022   AST 20 04/05/2022   ALT 26 04/05/2022   ANIONGAP 8 04/09/2022    GFR: Estimated Creatinine Clearance: 62.2 mL/min (by C-G formula based on SCr of 0.57 mg/dL).  No results found for this or any previous visit (from the past 240 hour(s)).     Radiology Studies: DG CHEST PORT 1 VIEW  Result Date: 04/07/2022 CLINICAL DATA:  Fever. EXAM: PORTABLE CHEST 1 VIEW COMPARISON:  03/29/2022. FINDINGS: Cardiac silhouette is normal in size. Normal mediastinal and hilar contours. Nasogastric tube passes below the diaphragm. Lungs hyperexpanded, but clear. No pleural effusion or pneumothorax. Skeletal structures are grossly intact. IMPRESSION: No active disease. Electronically Signed   By: Lajean Manes M.D.   On: 04/07/2022 10:18      LOS: 11 days    Cordelia Poche, MD [facility] 04/09/2022, 8:01 AM   If 7PM-7AM, please contact night-coverage [email or web address]

## 2022-04-09 NOTE — Progress Notes (Signed)
Inpatient Rehab Admissions Coordinator:    I spoke with pt.'s sister and she states that family cannot provide 24/7 care following CIR. Pt. Will need supervision after CIR d/t cognitive impairments, potentially in the long term CIR is not able to offer a bed due to lack of adequate support.   Clemens Catholic, [place], [place] Admissions Coordinator  [phone] ([place]) [phone] (office)

## 2022-04-09 NOTE — Progress Notes (Signed)
I have spoken with stroke service MD. We have reviewed the patient's situation. 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 As this type of fistula would be treated on a subacute basis, would plan on angiogram before the patient is discharged from [place].  [name] Lose, MD [facility]

## 2022-04-09 NOTE — Progress Notes (Signed)
Physical Therapy Treatment Patient Details Name: [name] Giles MRN: [id number] DOB: 1955-10-15 Today's Date: 04/09/2022   History of Present Illness [name] is a 66 y.o. female presenting 30/10 after being found on the bathroom at work, head CT revealed a large left frontotemporal ICH with IVH and 5 mm midline shift and ballooning of the temporal horns concerning for obstructive hydrocephalus s/p placement of right frontal ventriculostomy via bur hole 8/10. EVD removed 8/18. PHMx: COPD, tobacco abuse and asthma.    PT Comments    Pt with improved ambulation tolerance this date however continues to require modA with RW to stay on task, walker management, and max directional verbal cues. Pt with noted R knee buckling with onset of fatigue. Pt continues to have word finding difficulty and unintelligible speech majority of time. Pt continues to have impaired processing, motor planning, sequencing, problem solving, decreased safety awareness and decreased insight to deficits. Spoke extensively with sister regarding pt's functional and cognitive impairments. Acute PT to cont to follow.   Recommendations for follow up therapy are one component of a multi-disciplinary discharge planning process, led by the attending physician.  Recommendations may be updated based on patient status, additional functional criteria and insurance authorization.  Follow Up Recommendations  Acute inpatient rehab (3hours/day)     Assistance Recommended at Discharge Frequent or constant Supervision/Assistance  Patient can return home with the following Two people to help with walking and/or transfers;A lot of help with bathing/dressing/bathroom;Assistance with feeding;Assistance with cooking/housework;Direct supervision/assist for medications management;Direct supervision/assist for financial management;Assist for transportation;Help with stairs or ramp for entrance   Equipment Recommendations  Other (comment)  (TBD)    Recommendations for Other Services Rehab consult     Precautions / Restrictions Precautions Precautions: Fall Precaution Comments: posey belt Restrictions Weight Bearing Restrictions: No     Mobility  Bed Mobility               General bed mobility comments: pt up in chair upon PT arrival    Transfers Overall transfer level: Needs assistance Equipment used: Rolling walker (2 wheels) Transfers: Sit to/from Stand Sit to Stand: Min assist           General transfer comment: max directional and verbal cues to complete transfer, responds best to "stand up"    Ambulation/Gait Ambulation/Gait assistance: Mod assist Gait Distance (Feet): 50 Feet (x1, 70x1) Assistive device: Rolling walker (2 wheels) Gait Pattern/deviations: Decreased stride length, Decreased step length - left, Step-to pattern, Narrow base of support, Knees buckling Gait velocity: decreased     General Gait Details: pt requiring constant assist from PT to keep forward momentum of RW to promote reciprocal gait pattern, pt with narrow base of support, easily distracted. Pt with noted onset of R knee buckling/instability with onset of fatigue   Stairs             Wheelchair Mobility    Modified Rankin (Stroke Patients Only) Modified Rankin (Stroke Patients Only) Pre-Morbid Rankin Score: No symptoms Modified Rankin: Moderately severe disability     Balance Overall balance assessment: Needs assistance Sitting-balance support: Feet supported, No upper extremity supported Sitting balance-Leahy Scale: Fair Sitting balance - Comments: fair sitting balance on EOB preparing to stand Postural control: Posterior lean Standing balance support: Bilateral upper extremity supported Standing balance-Leahy Scale: Poor Standing balance comment: requires bilat UE support for ambulation  Cognition Arousal/Alertness: Awake/alert Behavior During Therapy:  Flat affect Overall Cognitive Status: Impaired/Different from baseline Area of Impairment: Orientation, Attention, Memory, Following commands, Safety/judgement, Awareness, Problem solving                 Orientation Level: Disoriented to, Place, Time, Situation (able to state name only) Current Attention Level: Focused Memory: Decreased short-term memory Following Commands: Follows one step commands inconsistently, Follows one step commands with increased time Safety/Judgement: Decreased awareness of safety, Decreased awareness of deficits Awareness: Intellectual Problem Solving: Slow processing, Decreased initiation, Difficulty sequencing, Requires verbal cues, Requires tactile cues General Comments: pt unable to process/sequence simple commands ie. show me 2 fingers, kick out your leg, thumbs up. pt did give high 5 to PT to command 2/5 time. pt with noted word finding difficulty, unintelligble speech majority of time. Pt did express excitement when she saw her sister stating "Theres my sister" very clear however was unable to say her sisters name. Pt with no carry over, recall at end of session despite being re-oriented several times at beginning of session, unable to follow simple verbal directional commands, requires tactile cueing        Exercises      General Comments General comments (skin integrity, edema, etc.): VSS      Pertinent Vitals/Pain Pain Assessment Pain Assessment: Faces Faces Pain Scale: No hurt    Home Living                          Prior Function            PT Goals (current goals can now be found in the care plan section) Acute Rehab PT Goals Patient Stated Goal: none stated PT Goal Formulation: Patient unable to participate in goal setting Time For Goal Achievement: 04/16/22 Potential to Achieve Goals: Good Progress towards PT goals: Progressing toward goals    Frequency    Min 4X/week      PT Plan Current plan remains  appropriate    Co-evaluation              AM-PAC PT "6 Clicks" Mobility   Outcome Measure  Help needed turning from your back to your side while in a flat bed without using bedrails?: A Lot Help needed moving from lying on your back to sitting on the side of a flat bed without using bedrails?: A Little Help needed moving to and from a bed to a chair (including a wheelchair)?: A Lot Help needed standing up from a chair using your arms (e.g., wheelchair or bedside chair)?: A Lot Help needed to walk in hospital room?: A Lot Help needed climbing 3-5 steps with a railing? : Total 6 Click Score: 12    End of Session Equipment Utilized During Treatment: Gait belt Activity Tolerance: Patient tolerated treatment well Patient left: in chair;with call bell/phone within reach;with chair alarm set;with family/visitor present Nurse Communication: Mobility status PT Visit Diagnosis: Unsteadiness on feet (R26.81);Other abnormalities of gait and mobility (R26.89);Repeated falls (R29.6);Muscle weakness (generalized) (M62.81)     Time: 0932-1000 PT Time Calculation (min) (ACUTE ONLY): 28 min  Charges:  $Gait Training: 8-22 mins $Neuromuscular Re-education: 8-22 mins                     Kittie Plater, PT, DPT Acute Rehabilitation Services Secure chat preferred Office #: [phone]    Berline Lopes 04/09/2022, 11:04 AM

## 2022-04-10 DIAGNOSIS — G936 Cerebral edema: Secondary | ICD-10-CM | POA: Diagnosis not present

## 2022-04-10 DIAGNOSIS — G911 Obstructive hydrocephalus: Secondary | ICD-10-CM | POA: Diagnosis not present

## 2022-04-10 DIAGNOSIS — R1310 Dysphagia, unspecified: Secondary | ICD-10-CM | POA: Diagnosis not present

## 2022-04-10 DIAGNOSIS — E785 Hyperlipidemia, unspecified: Secondary | ICD-10-CM | POA: Diagnosis not present

## 2022-04-10 LAB — BASIC METABOLIC PANEL
Anion gap: 9 (ref 5–15)
BUN: 16 mg/dL (ref 8–23)
CO2: 28 mmol/L (ref 22–32)
Calcium: 9.1 mg/dL (ref 8.9–10.3)
Chloride: 104 mmol/L (ref 98–111)
Creatinine, Ser: 0.69 mg/dL (ref 0.44–1.00)
GFR, Estimated: >60 mL/min (ref >60)
Glucose, Bld: 103 mg/dL — ABNORMAL HIGH (ref 70–99)
Potassium: 4 mmol/L (ref 3.5–5.1)
Sodium: 141 mmol/L (ref 135–145)

## 2022-04-10 LAB — CBC
HCT: 38.5 % (ref 36.0–46.0)
Hemoglobin: 12.9 g/dL (ref 12.0–15.0)
MCH: 30.2 pg (ref 26.0–34.0)
MCHC: 33.5 g/dL (ref 30.0–36.0)
MCV: 90.2 fL (ref 80.0–100.0)
Platelets: 289 10*3/uL (ref 150–400)
RBC: 4.27 MIL/uL (ref 3.87–5.11)
RDW: 13.2 % (ref 11.5–15.5)
WBC: 11.5 10*3/uL — ABNORMAL HIGH (ref 4.0–10.5)
nRBC: 0 % (ref 0.0–0.2)

## 2022-04-10 LAB — GLUCOSE, CAPILLARY
Glucose-Capillary: 137 mg/dL — ABNORMAL HIGH (ref 70–99)
Glucose-Capillary: 120 mg/dL — ABNORMAL HIGH (ref 70–99)

## 2022-04-10 MED ORDER — ENSURE ENLIVE PO LIQD
237.0000 mL | Freq: Two times a day (BID) | ORAL | Status: DC
  Administered 2022-04-11 – 2022-04-16 (×10): 237 mL via ORAL

## 2022-04-10 MED ORDER — ADULT MULTIVITAMIN W/MINERALS CH
1.0000 | ORAL_TABLET | Freq: Every day | ORAL | Status: DC
  Administered 2022-04-10 – 2022-04-16 (×6): 1 via ORAL
  Filled 2022-04-10 (×7): qty 1

## 2022-04-10 NOTE — Progress Notes (Signed)
Inpatient Rehab Admissions Coordinator:    I spoke with son and he states he was not able to arrange 24/7 care for after CIR, and wants SNF. I will withdraw my auth for CIR.   Clemens Catholic, [place], [place] Admissions Coordinator  [phone] ([place]) [phone] (office)

## 2022-04-10 NOTE — Progress Notes (Signed)
Speech Language Pathology Treatment: Dysphagia  Patient Details Name: JLYNN LY MRN: [id number] DOB: 1955/12/08 Today's Date: 04/10/2022 Time: 3704-8889 SLP Time Calculation (min) (ACUTE ONLY): 20 min  Assessment / Plan / Recommendation Clinical Impression  Patient seen by SLP for skilled treatment focused on dysphagia goals. Patient awake, alert in bed and agreeable to having dinner meal which was being delivered as SLP arrived. SLP provided some assistance with setup of meal tray such as cutting meat but patient able to feed herself. She ate approximately 10-15% before starting to push tray away and indicating she was going to take a "break". Patient did exhibit intermittent congested sounding cough but based on observations during current session and review of MBS results, suspect this is related to secretions/phlegm as opposed to PO's. SLP will continue to follow patient for cognitive-linguistic and swallow function goals. Of note, previous recommendation for AIR changed to SNF to reflect change in POC as patient's son was not able to arrange for the necessary 24/7 care that AIR requires.    HPI HPI: Pt is a 66 y/o female who presented with headache, N/V, and AMS. CTH revealed a large L frontotemporal ICH with IVH and 49m midline shift. Repeat head CT with increasing hemorrhage and associated hydrocephalus. Pt s/p ventriculostomy 8/10. PMH: COPD, tobacco abuse, asthma.      SLP Plan  Continue with current plan of care      Recommendations for follow up therapy are one component of a multi-disciplinary discharge planning process, led by the attending physician.  Recommendations may be updated based on patient status, additional functional criteria and insurance authorization.    Recommendations  Diet recommendations: Regular;Thin liquid Liquids provided via: Cup;Straw Medication Administration: Whole meds with liquid Supervision: Intermittent supervision to cue for compensatory  strategies Compensations: Slow rate;Small sips/bites;Minimize environmental distractions Postural Changes and/or Swallow Maneuvers: Seated upright 90 degrees                Oral Care Recommendations: Oral care BID Follow Up Recommendations: Skilled nursing-short term rehab (<3 hours/day) Assistance recommended at discharge: Frequent or constant Supervision/Assistance SLP Visit Diagnosis: Dysphagia, unspecified (R13.10) Plan: Continue with current plan of care         JSonia Baller MA, CCC-SLP Speech Therapy

## 2022-04-10 NOTE — Progress Notes (Signed)
Nutrition Follow-up  DOCUMENTATION CODES:   Not applicable  INTERVENTION:  Continue current diet as ordered Discontinue Prosource Plus and change to Ensure Enlive po BID, each supplement provides 350 kcal and 20 grams of protein. Magic cup TID with meals, each supplement provides 290 kcal and 9 grams of protein MVI with minerals daily  NUTRITION DIAGNOSIS:  Inadequate oral intake related to inability to eat as evidenced by NPO status. - Ongoing  GOAL:  Patient will meet greater than or equal to 90% of their needs - Progressing, diet advanced  MONITOR: TF tolerance  REASON FOR ASSESSMENT:   (cortrak tube)    ASSESSMENT:  Pt with PMH of COPD, current every day smoker admitted with ICH.   8/11 cortrak placed; tip gastric 8/16 cortrak replaced after being removed by pt 8/15; tip in stomach  8/18 EVD removed, MBS  SLP Diet Recommendations: Regular solids;Thin liquid 8/19 cortrak removed by pt  Pt laying in bed at the time of assessment. Lunch tray at bedside noted to be minimally consumed (ate pudding and a bite of fish). Pt unable to provide a nutrition hx, still exhibiting word salad. NT and RN report that pt did well with breakfast and ate 100%. Does not like the prosource supplement, will adjust and order supplements with her meals and between to encourage more intake.  If unable to increase intake, may need to consider long-term feeding tube route if in align with goals of care.  Average Meal Intake: 8/18-8/21: 16% intake x 4 recorded meals  Nutritionally Relevant Medications: Scheduled Meds:  PROSource Plus  30 mL Oral BID BM   potassium chloride  40 mEq Oral BID   rosuvastatin  20 mg Oral Daily   senna-docusate  1 tablet Oral BID   Labs Reviewed   Diet Order:   Diet Order             Diet regular Room service appropriate? Yes with Assist; Fluid consistency: Thin  Diet effective now                   EDUCATION NEEDS:   No education needs have been  identified at this time  Skin:  Skin Assessment: Reviewed RN Assessment  Last BM:  8/21 - type 6  Height:   Ht Readings from Last 1 Encounters:  03/29/22 '5\' 5"'$  (1.651 m)    Weight:   Wt Readings from Last 1 Encounters:  03/29/22 66.2 kg    BMI:  Body mass index is 24.29 kg/m.  Estimated Nutritional Needs:   Kcal:  1800-2000  Protein:  90-100 grams  Fluid:  >1.8 L/day   Ranell Patrick, RD, LDN Clinical Dietitian RD pager # available in [facility]  After hours/weekend pager # available in [facility]

## 2022-04-10 NOTE — Progress Notes (Signed)
PROGRESS NOTE    [name] Giles  PPJ:[id number] DOB: 1956/08/09 DOA: 03/29/2022 PCP: Redmond School, MD   Brief Narrative: [name] Giles is a 66 y.o. female with a history of COPD, tobacco use and asthma. 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 Patient was admitted to the ICU for management. Ventriculostomy secondary to hydrocephalus which has improved. PT/OT recommending acute inpatient rehabilitation on discharge. Patient continues to suffer from delirium.   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 Patient was admitted to the ICU on admission under stoke service. Initial CT head (8/10) identified large temporal and frontal ICH and IVH with impending left uncal herniation and midline shift of 5 mm with 2 hour interval increase of left IPH and new IPH within the left uncus/medial temporal lobe. MRI (8/10) with similar findings in addition to increased midline shift to 7 mm and repeat CT head (8/11) significant for increased midline shift to 10 mm. CTA head/neck (8/11) significant for AVF/AVM of left temporal lobe. CT head (8/12) was significant for stabilized lesions. LDL of 110, hemoglobin A1C of 5.7% and Transthoracic Echocardiogram significant for LVEF of 60-65% with no atrial level shunt identified and no thrombus identified. PT/OT recommending acute inpatient rehabilitation.  Cerebral edema Obstructive hydrocephalus Neurosurgery consulted. Patient managed on 3% hypertonic saline from 8/10 until 8/12. Right frontal ventriculostomy placed on 8/10 by neurosurgery and was removed on 8/18.  Delirium Patient managed with Precedex in the ICU which was weaned off. 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 Neurosurgery recommending angiogram which will need to be performed  when patient is more cooperative.  Hypertension No prior known history. Patient was managed with Cleviprex IV drip while in the ICU in addition to amlodipine. Amlodipine discontinued secondary to hypotension. Blood pressure increased. -Amlodipine 2.5 mg daily  Hyperlipidemia LDL of 119. Statin not initially started secondary to NPO status. -Crestor 20 mg daily  Dysphagia Cortrak placed and tube feeds initiated. SLP on board. Patient advanced to regular diet. Patient pulled out Cortrak tube on 8/19. Eating better this morning. -Continue regular diet  Tobacco use Noted. Not started in nicotine replacement therapy at this time.  Hypokalemia Potassium down to 3.1 today. -Potassium supplementation  Hypernatremia Peak sodium of 151. Complicated by NPO status. Resolved.  Prediabetes Hemoglobin A1C calculated to be 5.7%.  COPD Patient is prescribed Symbicort and albuterol as an out patient, both listed as not taking. Currently no exacerbation -Continue Pulmicort and Brovana scheduled -Continue Duoneb PRN  DVT prophylaxis: Heparin subq Code Status:   Code Status: Full Code Family Communication: Sister at bedside Disposition Plan: Discharge to SNF as family cannot provide 24/7 care after acute inpatient rehab.   Consultants:  Neurology/Stroke Neurosurgery PCCM  Procedures:    Antimicrobials:     Subjective: Patient reports no issues. She states she ate all her breakfast this morning.  Objective: BP (!) 131/52 (BP Location: Right Arm)   Pulse 66   Temp 99 F (37.2 C) (Oral)   Resp 18   Ht '5\' 5"'$  (1.651 m)   Wt 66.2 kg   SpO2 91%   BMI 24.29 kg/m   Examination:  General exam: Appears calm and comfortable Respiratory system: Clear to auscultation. Respiratory effort normal. Cardiovascular system: S1 & S2 heard, RRR. No murmurs, rubs, gallops or clicks. Gastrointestinal system: Abdomen is  nondistended, soft and nontender. Normal bowel sounds heard. Central  nervous system: Alert and oriented. Musculoskeletal: No edema. No calf tenderness   Data Reviewed: I have personally reviewed following labs and imaging studies  CBC Lab Results  Component Value Date   WBC 11.5 (H) 04/10/2022   RBC 4.27 04/10/2022   HGB 12.9 04/10/2022   HCT 38.5 04/10/2022   MCV 90.2 04/10/2022   MCH 30.2 04/10/2022   PLT 289 04/10/2022   MCHC 33.5 04/10/2022   RDW 13.2 04/10/2022   LYMPHSABS 2.3 04/05/2022   MONOABS 1.2 (H) 04/05/2022   EOSABS 0.0 04/05/2022   BASOSABS 0.0 57/89/7847     Last metabolic panel Lab Results  Component Value Date   NA 141 04/10/2022   K 4.0 04/10/2022   CL 104 04/10/2022   CO2 28 04/10/2022   BUN 16 04/10/2022   CREATININE 0.69 04/10/2022   GLUCOSE 103 (H) 04/10/2022   GFRNONAA >60 04/10/2022   GFRAA >60 12/03/2019   CALCIUM 9.1 04/10/2022   PHOS 3.4 04/02/2022   PROT 6.0 (L) 04/05/2022   ALBUMIN 3.0 (L) 04/05/2022   BILITOT 0.6 04/05/2022   ALKPHOS 72 04/05/2022   AST 20 04/05/2022   ALT 26 04/05/2022   ANIONGAP 9 04/10/2022    GFR: Estimated Creatinine Clearance: 62.2 mL/min (by C-G formula based on SCr of 0.69 mg/dL).  No results found for this or any previous visit (from the past 240 hour(s)).     Radiology Studies: No results found.    LOS: 12 days    Cordelia Poche, MD [facility] 04/10/2022, 12:15 PM   If 7PM-7AM, please contact night-coverage [email or web address]

## 2022-04-10 NOTE — Progress Notes (Signed)
Physical Therapy Treatment Patient Details Name: [name] Giles MRN: [id number] DOB: [birth year] Today's Date: 04/10/2022   History of Present Illness [name] Giles is a 66 y.o. female presenting 74/10 after being found on the bathroom at work, head CT revealed a large left frontotemporal ICH with IVH and 5 mm midline shift and ballooning of the temporal horns concerning for obstructive hydrocephalus s/p placement of right frontal ventriculostomy via bur hole 8/10. EVD removed 8/18. PHMx: COPD, tobacco abuse and asthma.    PT Comments    Patient continues to require multimodal cues for sequencing and safety with bed mobility, transfers, and gait. Pt is slightly impulsive with poor safety awareness and initiated supine>sit despite cues to wait for therapist to be ready. Pt required min assist to rise from EOB and toilet and Mod Assist for balance and to manage walker with gait. EOS attempt to guide pt to recliner however pt turned impulsively to sit on EOB and returned to supine. Bed moved to chair position and sister at bedside visiting. Continue to recommend intense rehab at AIR setting to address functional and cognitive deficits for improved safety at home.    Recommendations for follow up therapy are one component of a multi-disciplinary discharge planning process, led by the attending physician.  Recommendations may be updated based on patient status, additional functional criteria and insurance authorization.  Follow Up Recommendations  Acute inpatient rehab (3hours/day)     Assistance Recommended at Discharge Frequent or constant Supervision/Assistance  Patient can return home with the following Two people to help with walking and/or transfers;A lot of help with bathing/dressing/bathroom;Assistance with feeding;Assistance with cooking/housework;Direct supervision/assist for medications management;Direct supervision/assist for financial management;Assist for transportation;Help with  stairs or ramp for entrance   Equipment Recommendations  Other (comment) (TBD)    Recommendations for Other Services Rehab consult     Precautions / Restrictions Precautions Precautions: Fall Precaution Comments: posey belt Restrictions Weight Bearing Restrictions: No     Mobility  Bed Mobility Overal bed mobility: Needs Assistance Bed Mobility: Supine to Sit, Sit to Supine     Supine to sit: Min guard, HOB elevated Sit to supine: Min guard   General bed mobility comments: pt with poor safety awareness beginning to move to EOB between footboard and side rail despite multimodal cues from PT to stop for safety. pt able to sit up and scoot to edge without assist.    Transfers Overall transfer level: Needs assistance Equipment used: Rolling walker (2 wheels) Transfers: Sit to/from Stand Sit to Stand: Min assist           General transfer comment: min assist to steady with rise to RW, pt using bil UE on RW to power up despite cues to push from EOB or use grab bars in bathroom.    Ambulation/Gait Ambulation/Gait assistance: Mod assist Gait Distance (Feet): 120 Feet Assistive device: Rolling walker (2 wheels) Gait Pattern/deviations: Decreased stride length, Decreased step length - left, Step-to pattern, Narrow base of support, Knees buckling Gait velocity: decreased     General Gait Details: Mod assist to manage RW and steady balance. Tactile cue from therapist required to deter posterior lean with gait and assist to prevent pt from pushing RW too far ahead as she tends to have heavy lean on RW. 3x standing rest breaks required to reposition for improved proximity to RW. EOS attempted to transition to HHA in room to ambulate to recliner however pt pivoting to sit on EOB and returned to supine.  Stairs             Wheelchair Mobility    Modified Rankin (Stroke Patients Only) Modified Rankin (Stroke Patients Only) Pre-Morbid Rankin Score: No  symptoms Modified Rankin: Moderately severe disability     Balance Overall balance assessment: Needs assistance Sitting-balance support: Feet supported, No upper extremity supported Sitting balance-Leahy Scale: Fair Sitting balance - Comments: fair sitting balance on EOB preparing to stand Postural control: Posterior lean Standing balance support: Bilateral upper extremity supported Standing balance-Leahy Scale: Poor Standing balance comment: requires bilat UE support for ambulation                            Cognition Arousal/Alertness: Awake/alert Behavior During Therapy: Flat affect Overall Cognitive Status: Impaired/Different from baseline Area of Impairment: Orientation, Attention, Memory, Following commands, Safety/judgement, Awareness, Problem solving                 Orientation Level: Disoriented to, Place, Time, Situation (able to state name only) Current Attention Level: Focused Memory: Decreased short-term memory Following Commands: Follows one step commands inconsistently, Follows one step commands with increased time Safety/Judgement: Decreased awareness of safety, Decreased awareness of deficits Awareness: Intellectual Problem Solving: Slow processing, Decreased initiation, Difficulty sequencing, Requires verbal cues, Requires tactile cues General Comments: pt unable to process/sequence simple commands ie. show me 2 fingers, kick out your leg, thumbs up. pt did give high 5 to PT to command 2/5 time. pt with noted word finding difficulty, unintelligble speech majority of time. Pt did express excitement when she saw her sister stating "Theres my sister" very clear however was unable to say her sisters name. Pt with no carry over, recall at end of session despite being re-oriented several times at beginning of session, unable to follow simple verbal directional commands, requires tactile cueing        Exercises      General Comments         Pertinent Vitals/Pain Pain Assessment Pain Assessment: Faces Faces Pain Scale: No hurt Pain Intervention(s): Monitored during session, Repositioned    Home Living                          Prior Function            PT Goals (current goals can now be found in the care plan section) Acute Rehab PT Goals Patient Stated Goal: none stated PT Goal Formulation: Patient unable to participate in goal setting Time For Goal Achievement: 04/16/22 Potential to Achieve Goals: Good Progress towards PT goals: Progressing toward goals    Frequency    Min 4X/week      PT Plan Current plan remains appropriate    Co-evaluation              AM-PAC PT "6 Clicks" Mobility   Outcome Measure  Help needed turning from your back to your side while in a flat bed without using bedrails?: A Lot Help needed moving from lying on your back to sitting on the side of a flat bed without using bedrails?: A Little Help needed moving to and from a bed to a chair (including a wheelchair)?: A Lot Help needed standing up from a chair using your arms (e.g., wheelchair or bedside chair)?: A Lot Help needed to walk in hospital room?: A Lot Help needed climbing 3-5 steps with a railing? : Total 6 Click Score: 12    End of  Session Equipment Utilized During Treatment: Gait belt Activity Tolerance: Patient tolerated treatment well Patient left: in chair;with call bell/phone within reach;with chair alarm set;with family/visitor present Nurse Communication: Mobility status PT Visit Diagnosis: Unsteadiness on feet (R26.81);Other abnormalities of gait and mobility (R26.89);Repeated falls (R29.6);Muscle weakness (generalized) (M62.81)     Time: 0347-4259 PT Time Calculation (min) (ACUTE ONLY): 27 min  Charges:  $Gait Training: 8-22 mins $Therapeutic Activity: 8-22 mins                     Verner Mould, DPT Acute Rehabilitation Services Office [phone] Pager [phone]  04/10/22  12:41 PM

## 2022-04-10 NOTE — TOC Progression Note (Addendum)
Transition of Care [facility]) - Progression Note    Patient Details  Name: BELYNDA PAGADUAN MRN: [id number] Date of Birth: 10-30-1955  Transition of Care [facility]) CM/SW [place],  Phone Number: 04/10/2022, 10:32 AM  Clinical Narrative:   CSW spoke with patient's son, Rodman Key, to provide SNF offers. Family to review options and will update CSW with SNF choice. CSW to follow.  UPDATE: CSW also received call from patient's sister, Juliann Pulse, to ask about possible options. Juliann Pulse interested in [place] or [place]. [facility] had declined, but didn't give a reason. CSW sent referral again and asked them to review. [facility] does not take patient's [facility], which is primary as she is still working. CSW sent referral to [facility], awaiting response. Juliann Pulse also asked about what would need to be in place for patient to admit to CIR, and CSW answered questions. Family is still reviewing if 24/7 may be an option so they could avoid SNF. CSW to follow.   Expected Discharge Plan: [place] Barriers to Discharge: Continued Medical Work up, Ship broker, SNF Pending bed offer  Expected Discharge Plan and Services Expected Discharge Plan: [place] arrangements for the past 2 months: Single Family Home                                       Social Determinants of Health (SDOH) Interventions    Readmission Risk Interventions     No data to display

## 2022-04-10 NOTE — Progress Notes (Addendum)
Patient refused CBG check, she pulled her hand and covered with blanket.

## 2022-04-10 NOTE — Plan of Care (Signed)
  Problem: Education: Goal: Knowledge of General Education information will improve Description: Including pain rating scale, medication(s)/side effects and non-pharmacologic comfort measures Outcome: Not Progressing   Problem: Activity: Goal: Risk for activity intolerance will decrease Outcome: Progressing   Problem: Nutrition: Goal: Adequate nutrition will be maintained Outcome: Progressing   Problem: Pain Managment: Goal: General experience of comfort will improve Outcome: Progressing   Problem: Safety: Goal: Ability to remain free from injury will improve Outcome: Progressing   Problem: Skin Integrity: Goal: Risk for impaired skin integrity will decrease Outcome: Progressing

## 2022-04-11 DIAGNOSIS — I10 Essential (primary) hypertension: Secondary | ICD-10-CM | POA: Diagnosis not present

## 2022-04-11 DIAGNOSIS — R410 Disorientation, unspecified: Secondary | ICD-10-CM | POA: Diagnosis not present

## 2022-04-11 DIAGNOSIS — E785 Hyperlipidemia, unspecified: Secondary | ICD-10-CM

## 2022-04-11 LAB — CBC
HCT: 39.9 % (ref 36.0–46.0)
Hemoglobin: 13.8 g/dL (ref 12.0–15.0)
MCH: 30.6 pg (ref 26.0–34.0)
MCHC: 34.6 g/dL (ref 30.0–36.0)
MCV: 88.5 fL (ref 80.0–100.0)
Platelets: 368 10*3/uL (ref 150–400)
RBC: 4.51 MIL/uL (ref 3.87–5.11)
RDW: 13.2 % (ref 11.5–15.5)
WBC: 12.4 10*3/uL — ABNORMAL HIGH (ref 4.0–10.5)
nRBC: 0 % (ref 0.0–0.2)

## 2022-04-11 LAB — GLUCOSE, CAPILLARY
Glucose-Capillary: 124 mg/dL — ABNORMAL HIGH (ref 70–99)
Glucose-Capillary: 163 mg/dL — ABNORMAL HIGH (ref 70–99)
Glucose-Capillary: 124 mg/dL — ABNORMAL HIGH (ref 70–99)

## 2022-04-11 LAB — BASIC METABOLIC PANEL
Anion gap: 8 (ref 5–15)
BUN: 13 mg/dL (ref 8–23)
CO2: 27 mmol/L (ref 22–32)
Calcium: 9 mg/dL (ref 8.9–10.3)
Chloride: 101 mmol/L (ref 98–111)
Creatinine, Ser: 0.73 mg/dL (ref 0.44–1.00)
GFR, Estimated: >60 mL/min (ref >60)
Glucose, Bld: 118 mg/dL — ABNORMAL HIGH (ref 70–99)
Potassium: 3.7 mmol/L (ref 3.5–5.1)
Sodium: 136 mmol/L (ref 135–145)

## 2022-04-11 MED ORDER — OXYCODONE HCL 5 MG PO TABS: 5.0000 mg | ORAL_TABLET | Freq: Four times a day (QID) | ORAL | Status: DC | PRN

## 2022-04-11 NOTE — Progress Notes (Signed)
Inpatient Rehab Admissions Coordinator:    Son states that family now able to provide 24/7 support. I still have insurance auth so can admit once medically ready; however, Per acute MD, will not admit today d/t delirium.   Clemens Catholic, [place], [place] Admissions Coordinator  [phone] ([place]) [phone] (office)

## 2022-04-11 NOTE — Progress Notes (Signed)
Pt MEWS yellow increased temp and HR. Yellow MEWs protocol stated MD notified.

## 2022-04-11 NOTE — H&P (Signed)
Physical Medicine and Rehabilitation Admission H&P     HPI: [name] Giles is a 66 year old right-handed female with history of COPD/tobacco abuse.  Per chart review patient lives with her son.  1 level home 4 steps to enter.  Independent prior to admission working at a [employer].  Presented 03/29/2022 after being found down.  With headache as well as vomiting with altered mental status.  CT showed a 5 x 4.5 x 4 cm intraparenchymal hemorrhage centered within the left anterior temporal and frontal lobes.  5 mm left to right midline shift as well as hydrocephalus.  CT angiogram head and neck no evidence of dural venous sinus thrombosis.  No large vessel occlusion.  Admission chemistries unremarkable, CK 61, troponin negative, ammonia level 20, urine drug screen negative.  Echocardiogram with ejection fraction of 60 to 65% no wall motion abnormalities.  Initially maintained on 3% hypertonic saline.  Patient did undergo placement of right frontal ventriculostomy via bur hole 03/29/2022 per Dr. Arnoldo Morale and removed 8/18.  Attempts at cerebral angiogram aborted due to primarily significant patient motion.  Latest follow-up cranial CT scan 04/12/2022 was stable noting decreased mass effect with decreasing edema around the hemorrhage.  She was cleared to begin subcutaneous heparin for DVT prophylaxis 04/01/2022.  Patient course delirium initially on Precedex and currently maintained on Seroquel.  Bouts of urinary retention placed on low-dose Urecholine.  Patient spiked a low-grade fever 102.5 with leukocytosis 18,000-15700 and monitored improved to 10,000.  Chest x-ray showed no signs of edema or pulmonary consolidation.  Urinalysis negative nitrite.  Blood cultures no growth to date.  Placed on empiric Rocephin as well as vancomycin transitioned to Unasyn 04/16/2022 x 3 days and stop.  Diet currently dysphagia 1 thin liquids.  Therapy evaluations completed due to patient decreased functional mobility was  admitted for a comprehensive rehab program.  Review of Systems  Constitutional:  Negative for chills and fever.  HENT:  Negative for hearing loss.   Eyes:  Negative for blurred vision and double vision.  Respiratory:  Positive for shortness of breath. Negative for cough and wheezing.   Cardiovascular:  Negative for chest pain, palpitations and leg swelling.  Gastrointestinal:  Positive for constipation and vomiting. Negative for heartburn and nausea.  Genitourinary:  Negative for dysuria, flank pain and hematuria.  Musculoskeletal:  Positive for joint pain and myalgias.  Skin:  Negative for rash.  Neurological:  Positive for headaches.  All other systems reviewed and are negative.  Past Medical History:  Diagnosis Date   Asthma    Medical history non-contributory    Past Surgical History:  Procedure Laterality Date   COLONOSCOPY     COLONOSCOPY N/A 04/06/2013   Procedure: COLONOSCOPY;  Surgeon: Daneil Dolin, MD;  Location: [facility];  Service: Endoscopy;  Laterality: N/A;  9:30 AM   ENDOMETRIAL ABLATION     IR ANGIO INTRA EXTRACRAN SEL INTERNAL CAROTID BILAT MOD SED  04/04/2022   IR US GUIDE VASC ACCESS RIGHT  04/04/2022   Family History  Adopted: Yes  Problem Relation Age of Onset   Breast cancer Mother    Hypertension Brother    Diabetes Brother    Hypertension Brother    Colon cancer Neg Hx    Social History:  reports that she has been smoking cigarettes. She has a 30.00 pack-year smoking history. She has never used smokeless tobacco. She reports current alcohol use. She reports that she does not use drugs. Allergies: No Known Allergies  Medications Prior to Admission  Medication Sig Dispense Refill   albuterol (VENTOLIN HFA) 108 (90 Base) MCG/ACT inhaler Inhale 1-2 puffs into the lungs every 4 (four) hours as needed for shortness of breath.     ibuprofen (ADVIL) 600 MG tablet Take 1 tablet (600 mg total) by mouth every 6 (six) hours as needed. (Patient taking  differently: Take 600 mg by mouth every 6 (six) hours as needed for mild pain.) 30 tablet 0   albuterol (PROVENTIL) 4 MG tablet Take 4 mg by mouth 3 (three) times daily. (Patient not taking: Reported on 03/29/2022)     fluticasone (FLONASE) 50 MCG/ACT nasal spray Place 2 sprays into both nostrils daily. (Patient not taking: Reported on 03/29/2022) 16 g 0   SYMBICORT 160-4.5 MCG/ACT inhaler Inhale 2 puffs into the lungs daily. (Patient not taking: Reported on 03/29/2022)        Home: Home Living Family/patient expects to be discharged to:: Private residence Living Arrangements: Children (73 yo son; grandchild every other week) Available Help at Discharge: Family, Available PRN/intermittently Type of Home: House Home Access: Stairs to enter Technical brewer of Steps: 4 Entrance Stairs-Rails: Right, Left Home Layout: One level Bathroom Shower/Tub: Tub/shower unit (thresholds/small changes in height throughout the house) Biochemist, clinical: Standard Bathroom Accessibility: No Home Equipment: None Additional Comments: thresholds/small changes in height throughout the house  Lives With: Son   Functional History: Prior Function Prior Level of Function : Independent/Modified Independent, Working/employed, 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 Transfers Overall transfer level: Needs assistance Equipment used: Rolling walker (2 wheels) Transfers: Sit to/from Stand Sit to Stand: Min assist Bed to/from chair/wheelchair/BSC transfer type:: Step pivot Step pivot transfers: Min assist General transfer comment: Min A to power to standing, despite cues for  hand placement, pt pulling up on RW. Stood from [employer], from chair x1. Transferred to chair post ambulation. Ambulation/Gait Ambulation/Gait assistance: Mod assist, Min assist Gait Distance (Feet): 80 Feet (x2 bouts) Assistive device: Rolling walker (2 wheels) Gait Pattern/deviations: Decreased stride length, Narrow base of support, Step-through pattern, Trunk flexed General Gait Details: Slow, unsteady gait with forward flexed posture with cues for RW proximity/management as RW too far anterior, bil knee instability when fatigued. 1 seated rest break. Gait velocity: decreased Gait velocity interpretation: <1.31 ft/sec, indicative of household ambulator    ADL: ADL Overall ADL's : Needs assistance/impaired Eating/Feeding: Minimal assistance, Set up, Sitting Eating/Feeding Details (indicate cue type and reason): attempted to assist patient with self feeding with patient resistant Grooming: Wash/dry hands, Wash/dry face, Brushing hair, Sitting, Moderate assistance Grooming Details (indicate cue type and reason): patient able to wash face and hands with multiple verbal cues.  Mod assist to brush hair Upper Body Bathing: Minimal assistance Lower Body Bathing: Moderate assistance, Sit to/from stand Lower Body Bathing Details (indicate cue type and reason): for peri area cleaning Upper Body Dressing : Minimal assistance Upper Body Dressing Details (indicate cue type and reason): to change gown Lower Body Dressing: Moderate assistance Lower Body Dressing Details (indicate cue type and reason): to donn socks Toilet Transfer: Minimal assistance, Ambulation, Regular Toilet Toilet Transfer Details (indicate cue type and reason): verbal cues for safety and min assist for  balance and walker use Toileting- Clothing Manipulation and Hygiene: Moderate assistance Toileting - Clothing Manipulation Details (indicate cue type and reason): nsg reports incontinenece of BM earlier this am Functional mobility  during ADLs: Moderate assistance General ADL Comments: difficulty following directions  Cognition: Cognition Overall Cognitive Status: Difficult to assess Arousal/Alertness: Lethargic Orientation Level: Disoriented X4 Attention: Focused Focused Attention: Impaired Focused Attention Impairment: Verbal basic Cognition Arousal/Alertness: Lethargic, Awake/alert Behavior During Therapy: Flat affect Overall Cognitive Status: Difficult to assess Area of Impairment: Following commands, Safety/judgement Orientation Level: Disoriented to, Place, Time, Situation (able to state name) Current Attention Level: Focused Memory: Decreased short-term memory Following Commands: Follows one step commands inconsistently, Follows one step commands with increased time Safety/Judgement: Decreased awareness of safety, Decreased awareness of deficits Awareness: Intellectual Problem Solving: Slow processing, Decreased initiation, Difficulty sequencing, Requires verbal cues, Requires tactile cues General Comments: Lethargic initially but once sitting EOB and moving, became more awake. follows simple commands with repetition and increased time, "what do you want me to do?" otherwise speech mostly incoherent Difficult to assess due to: Impaired communication (expressive difficulties)  Physical Exam: Blood pressure (!) 115/48, pulse 88, temperature 99.5 F (37.5 C), temperature source Oral, resp. rate 20, height '5\' 5"'$  (1.651 m), weight 66.2 kg, SpO2 99 %. Physical Exam Constitutional:      General: She is not in acute distress.    Appearance: She is not ill-appearing.  HENT:     Head:     Comments: Staples/sutures on shaved central scalp    Nose: Nose normal.     Mouth/Throat:     Mouth: Mucous membranes are moist.  Eyes:     General: No scleral icterus.    Conjunctiva/sclera: Conjunctivae normal.  Cardiovascular:     Rate and Rhythm: Normal rate and regular rhythm.     Heart sounds: No murmur heard.     No gallop.  Pulmonary:     Effort: Pulmonary effort is normal.     Breath sounds: Normal breath sounds.  Abdominal:     General: Bowel sounds are normal.     Palpations: Abdomen is soft.  Musculoskeletal:        General: No swelling or tenderness. Normal range of motion.     Cervical back: Normal range of motion.     Right lower leg: No edema.     Left lower leg: No edema.  Skin:    General: Skin is warm and dry.  Neurological:     Mental Status: She is alert.     Comments: Patient is alert and comfortable.  Makes eye contact with examiner.  Follows simple commands with extra time, repetition and cues. Would not cooperate consistently with CN exam. Word salad with occasional appropriate response to simple questions (<10%). Moves all 4 limbs, senses pain. No abnl tone.   Psychiatric:     Comments: Seems generally up beat, engaging.      Results for orders placed or performed during the hospital encounter of 03/29/22 (from the past 48 hour(s))  Glucose, capillary     Status: Abnormal   Collection Time: 04/14/22 11:38 AM  Result Value Ref Range   Glucose-Capillary 116 (H) 70 - 99 mg/dL    Comment: Glucose reference range applies only to samples taken after fasting for at least 8 hours.   Comment 1 Notify RN   Glucose, capillary     Status: Abnormal   Collection Time: 04/14/22  5:51 PM  Result Value Ref Range   Glucose-Capillary 131 (H)  70 - 99 mg/dL    Comment: Glucose reference range applies only to samples taken after fasting for at least 8 hours.   Comment 1 Notify RN   Glucose, capillary     Status: Abnormal   Collection Time: 04/14/22 11:16 PM  Result Value Ref Range   Glucose-Capillary 103 (H) 70 - 99 mg/dL    Comment: Glucose reference range applies only to samples taken after fasting for at least 8 hours.  Basic metabolic panel     Status: Abnormal   Collection Time: 04/15/22  2:06 AM  Result Value Ref Range   Sodium 136 135 - 145 mmol/L   Potassium 3.8 3.5 - 5.1  mmol/L   Chloride 103 98 - 111 mmol/L   CO2 25 22 - 32 mmol/L   Glucose, Bld 115 (H) 70 - 99 mg/dL    Comment: Glucose reference range applies only to samples taken after fasting for at least 8 hours.   BUN 22 8 - 23 mg/dL   Creatinine, Ser 0.61 0.44 - 1.00 mg/dL   Calcium 8.6 (L) 8.9 - 10.3 mg/dL   GFR, Estimated >60 >60 mL/min    Comment: (NOTE) Calculated using the CKD-EPI Creatinine Equation (2021)    Anion gap 8 5 - 15    Comment: Performed at [facility] [address], [place], [place] [address]  CBC     Status: Abnormal   Collection Time: 04/15/22  2:06 AM  Result Value Ref Range   WBC 10.0 4.0 - 10.5 K/uL   RBC 3.79 (L) 3.87 - 5.11 MIL/uL   Hemoglobin 11.4 (L) 12.0 - 15.0 g/dL   HCT 34.2 (L) 36.0 - 46.0 %   MCV 90.2 80.0 - 100.0 fL   MCH 30.1 26.0 - 34.0 pg   MCHC 33.3 30.0 - 36.0 g/dL   RDW 13.6 11.5 - 15.5 %   Platelets 386 150 - 400 K/uL   nRBC 0.0 0.0 - 0.2 %    Comment: Performed at [facility], [place] [address] [place], [place] [address]  Glucose, capillary     Status: None   Collection Time: 04/15/22  5:49 AM  Result Value Ref Range   Glucose-Capillary 86 70 - 99 mg/dL    Comment: Glucose reference range applies only to samples taken after fasting for at least 8 hours.  Glucose, capillary     Status: None   Collection Time: 04/15/22 12:00 PM  Result Value Ref Range   Glucose-Capillary 94 70 - 99 mg/dL    Comment: Glucose reference range applies only to samples taken after fasting for at least 8 hours.   Comment 1 Notify RN   Glucose, capillary     Status: Abnormal   Collection Time: 04/15/22  5:55 PM  Result Value Ref Range   Glucose-Capillary 106 (H) 70 - 99 mg/dL    Comment: Glucose reference range applies only to samples taken after fasting for at least 8 hours.   Comment 1 Notify RN   Glucose, capillary     Status: Abnormal   Collection Time: 04/15/22 11:12 PM  Result Value Ref Range   Glucose-Capillary 100 (H) 70 - 99 mg/dL     Comment: Glucose reference range applies only to samples taken after fasting for at least 8 hours.  Vancomycin, trough     Status: Abnormal   Collection Time: 04/16/22  3:50 AM  Result Value Ref Range   Vancomycin Tr 11 (L) 15 - 20 ug/mL  Comment: Performed at [facility], [place] [address], [place], [place] [address]  Glucose, capillary     Status: None   Collection Time: 04/16/22  5:56 AM  Result Value Ref Range   Glucose-Capillary 88 70 - 99 mg/dL    Comment: Glucose reference range applies only to samples taken after fasting for at least 8 hours.   No results found.    Blood pressure (!) 115/48, pulse 88, temperature 99.5 F (37.5 C), temperature source Oral, resp. rate 20, height '5\' 5"'$  (1.651 m), weight 66.2 kg, SpO2 99 %.  Medical Problem List and Plan: 1. 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  Patient WILL NEED FORMAL CATHETER ANGIOGRAM BEFORE DISCHARGE FROM CIR PER DR DTOIZTIWP  -patient may shower  -ELOS/Goals: 10-12 days, supervision goals with PT, OT, mod assist with language 2.  Antithrombotics: -DVT/anticoagulation:  Pharmaceutical: Heparin  -antiplatelet therapy: N/A 3. Pain Management: Oxycodone as needed 4. Mood/Behavior/Sleep: Melatonin 3 mg nightly.  Provide emotional support  -antipsychotic agents: Seroquel 25 mg nightly  -monitor sleep habits 5. Neuropsych/cognition: This patient is not capable of making decisions on her own behalf. 6. Skin/Wound Care: Routine skin checks. Remove sutures/staples soon.  7. Fluids/Electrolytes/Nutrition: Routine in and outs follow-up chemistries 8.  Hyperlipidemia.  Crestor 9.  COPD with tobacco use.  Continue inhalers as directed.  Provide counts regards to cessation of nicotine products 10.  Hypertension.  Norvasc 2.5 mg daily.  Monitor with increased mobility 11.  Urinary retention.  Urecholine 10 mg 3 times  daily.  Check PVR' and emptying patterns.  12.  Hyperlipidemia.  Crestor 13.  Leukocytosis.   Maintained initially on empiric IV Rocephin and vancomycin.  Transitioned to Unasyn 04/16/2022 x 3 days and stop  -temps appear to be falling  14.  Dysphagia.  Dysphagia #1 thin liquids.     -advance per SLP  Lavon Paganini [place], PA-C 04/16/2022

## 2022-04-11 NOTE — TOC Progression Note (Signed)
Transition of Care [facility]) - Progression Note    Patient Details  Name: CLEO SANTUCCI MRN: [id number] Date of Birth: 06/14/1956  [facility] CM/SW [place], [place] Phone Number: 04/11/2022, 10:15 AM  Clinical Narrative:   CSW received call from [facility] asking about any additional SNF options. Matthew asked for time to discuss with his aunt and call back for decision. Rodman Key called back and indicated that the family has decided they will come together and provide 24/7 for the patient to go to CIR instead, as they do not like the SNF options. Rodman Key indicated that they've had a lot of family offer to assist and they will be able to make it work. CSW updated Rehab Admissions and they will follow.     Expected Discharge Plan: IP Rehab Facility Barriers to Discharge: Continued Medical Work up, Ship broker, SNF Pending bed offer  Expected Discharge Plan and Services Expected Discharge Plan: [place] arrangements for the past 2 months: Single Family Home                                       Social Determinants of Health (SDOH) Interventions    Readmission Risk Interventions     No data to display

## 2022-04-11 NOTE — PMR Pre-admission (Signed)
PMR Admission Coordinator Pre-Admission Assessment  Patient: [name] Giles is an 66 y.o., female MRN: [id number] DOB: 1955/09/21 Height: 5' 5"  (165.1 cm) Weight: 66.2 kg  Insurance Information HMO:     PPO:      PCP:      IPA:      80/20:      OTHER:  PRIMARY: [facility] other      Policy#: [id number]      Subscriber: Pt.  CM Name: Delsa Sale       Phone#:  [phone] ext [phone]    Fax#: [phone] Pt. Approved 8/21-8/28 with updates due 9/70 Pre-Cert#: [id number]      Employer:  Benefits:  Phone #:     Name:  Irene Shipper Date: 08/20/2021- 08/19/2022 Deductible: $500 ($500 met) OOP Max: $2,774.12 ($3,500 met) CIR: 80% coverage, 20% co-insurance SNF: 80% coverage, 20% co-insurance, Limited to 60 Days Per Calendar Year. Outpatient:  $30 copay/visit Home Health: 80% coverage, 20% co-insurance; Limited to 60 Visits Per Calendar Year. 1 visit equals up to 4 hours of skilled care services. DME: 80% coverage, 20% co-insurance; You can get 1 type of DME or orthotic (including repair/replacement) every 3 Years Providers: in network   SECONDARY:       Policy#:      Phone#:   Development worker, community:       Phone#:   The Engineer, petroleum" for patients in Inpatient Rehabilitation Facilities with attached "Privacy Act [place] Records" was provided and verbally reviewed with: Patient  Emergency Contact Information Contact Information     Name Relation Home Work Mobile   [place] Son [phone]  [phone]   Rauch,Kathy Sister   [phone]       Current Medical History  Patient Admitting Diagnosis: ICH  History of Present Illness:[name] Giles is a 66 year old right-handed female with history of COPD/tobacco abuse.  Per chart review patient lives with her son.  1 level home 4 steps to enter.  Independent prior to admission working at a [employer].  Presented 03/29/2022 after being found down.  With headache as well as vomiting with altered mental status.   CT showed a 5 x 4.5 x 4 cm intraparenchymal hemorrhage centered within the left anterior temporal and frontal lobes.  5 mm left to right midline shift as well as hydrocephalus.  CT angiogram head and neck no evidence of dural venous sinus thrombosis.  No large vessel occlusion.  Admission chemistries unremarkable, CK 61, troponin negative, ammonia level 20, urine drug screen negative.  Echocardiogram with ejection fraction of 60 to 65% no wall motion abnormalities.  Initially maintained on 3% hypertonic saline.  Patient did undergo placement of right frontal ventriculostomy via bur hole 03/29/2022 per Dr. Arnoldo Morale and removed 8/18.  Attempts at cerebral angiogram aborted due to primarily significant patient motion.  Latest follow-up cranial CT scan 04/12/2022 was stable noting decreased mass effect with decreasing edema around the hemorrhage.  She was cleared to begin subcutaneous heparin for DVT prophylaxis 04/01/2022.  Patient course delirium initially on Precedex and currently maintained on Seroquel.  Bouts of urinary retention placed on low-dose Urecholine.  Patient spiked a low-grade fever 102.5 with leukocytosis 18,000-15700 and monitored improved to 10,000.  Chest x-ray showed no signs of edema or pulmonary consolidation.  Urinalysis negative nitrite.  Blood cultures no growth to date.  Placed on empiric Rocephin as well as vancomycin.  Diet currently dysphagia 1 thin liquids.  Therapy evaluations completed due to patient decreased functional mobility was admitted for a  comprehensive rehab program.     Complete NIHSS TOTAL: 5  Patient's medical record from [facility]  has been reviewed by the rehabilitation admission coordinator and physician.  Past Medical History  Past Medical History:  Diagnosis Date   Asthma    Medical history non-contributory     Has the patient had major surgery during 100 days prior to admission? Yes  Family History   family history includes Breast cancer  in her mother; Diabetes in her brother; Hypertension in her brother and brother. She was adopted.  Current Medications  Current Facility-Administered Medications:    acetaminophen (TYLENOL) tablet 650 mg, 650 mg, Oral, Q4H PRN, 650 mg at 04/15/22 0011 **OR** acetaminophen (TYLENOL) 160 MG/5ML solution 650 mg, 650 mg, Per Tube, Q4H PRN **OR** acetaminophen (TYLENOL) suppository 650 mg, 650 mg, Rectal, Q4H PRN, Pokhrel, Laxman, MD   amLODipine (NORVASC) tablet 2.5 mg, 2.5 mg, Oral, Daily, Lonny Prude, Ralph A, MD, 2.5 mg at 04/16/22 0837   arformoterol (BROVANA) nebulizer solution 15 mcg, 15 mcg, Nebulization, BID, Agarwala, Ravi, MD, 15 mcg at 04/16/22 0817   bethanechol (URECHOLINE) tablet 10 mg, 10 mg, Oral, TID, Mariel Aloe, MD, 10 mg at 04/16/22 0837   budesonide (PULMICORT) nebulizer solution 0.25 mg, 0.25 mg, Nebulization, BID, Agarwala, Ravi, MD, 0.25 mg at 04/16/22 0817   cefTRIAXone (ROCEPHIN) 2 g in sodium chloride 0.9 % 100 mL IVPB, 2 g, Intravenous, Q12H, Pokhrel, Laxman, MD, Last Rate: 200 mL/hr at 04/16/22 0159, 2 g at 04/16/22 0159   feeding supplement (ENSURE ENLIVE / ENSURE PLUS) liquid 237 mL, 237 mL, Oral, BID BM, Mariel Aloe, MD, 237 mL at 04/16/22 0838   haloperidol lactate (HALDOL) injection 1 mg, 1 mg, Intravenous, Q30 min PRN, Agarwala, Ravi, MD, 1 mg at 04/09/22 2249   heparin injection 5,000 Units, 5,000 Units, Subcutaneous, Q8H, Garvin Fila, MD, 5,000 Units at 04/16/22 0615   hydrALAZINE (APRESOLINE) injection 20 mg, 20 mg, Intravenous, Q6H PRN, Bailey-Modzik, Delila A, NP, 20 mg at 04/02/22 2241   iohexol (OMNIPAQUE) 300 MG/ML solution 100 mL, 100 mL, Intra-arterial, Once PRN, [name], MD   ipratropium-albuterol (DUONEB) 0.5-2.5 (3) MG/3ML nebulizer solution 3 mL, 3 mL, Nebulization, Q6H PRN, Collene Gobble, MD, 3 mL at 04/03/22 1303   melatonin tablet 3 mg, 3 mg, Oral, QHS, Agarwala, Ravi, MD, 3 mg at 04/15/22 2102   multivitamin with minerals tablet 1  tablet, 1 tablet, Oral, Daily, Mariel Aloe, MD, 1 tablet at 04/16/22 7096   Oral care mouth rinse, 15 mL, Mouth Rinse, 4 times per day, Amie Portland, MD, 15 mL at 04/16/22 2836   Oral care mouth rinse, 15 mL, Mouth Rinse, PRN, Amie Portland, MD   oxyCODONE (Oxy IR/ROXICODONE) immediate release tablet 5 mg, 5 mg, Oral, Q6H PRN, Pokhrel, Laxman, MD   potassium chloride (KLOR-CON) packet 40 mEq, 40 mEq, Oral, BID, Reome, Earle J, RPH, 40 mEq at 04/16/22 0837   QUEtiapine (SEROQUEL) tablet 25 mg, 25 mg, Oral, QPM, Mariel Aloe, MD, 25 mg at 04/15/22 2102   revefenacin (YUPELRI) nebulizer solution 175 mcg, 175 mcg, Nebulization, Daily, Agarwala, Ravi, MD, 175 mcg at 04/16/22 0817   rosuvastatin (CRESTOR) tablet 20 mg, 20 mg, Oral, Daily, Rosalin Hawking, MD, 20 mg at 04/16/22 6294   senna-docusate (Senokot-S) tablet 1 tablet, 1 tablet, Oral, BID, Mariel Aloe, MD, 1 tablet at 04/15/22 2102   sodium chloride flush (NS) 0.9 % injection 10-40 mL, 10-40 mL, Intracatheter, Q12H,  Newman Pies, MD, 10 mL at 04/16/22 0839   sodium chloride flush (NS) 0.9 % injection 10-40 mL, 10-40 mL, Intracatheter, PRN, Newman Pies, MD, 20 mL at 04/03/22 0935   vancomycin (VANCOCIN) IVPB 1000 mg/200 mL premix, 1,000 mg, Intravenous, Q12H, Ledford, James L, RPH  Patients Current Diet:  Diet Order             DIET - DYS 1 Room service appropriate? No; Fluid consistency: Thin  Diet effective now                   Precautions / Restrictions Precautions Precautions: Fall Precaution Comments: posey belt Restrictions Weight Bearing Restrictions: No   Has the patient had 2 or more falls or a fall with injury in the past year? Yes  Prior Activity Level    Prior Functional Level Self Care: Did the patient need help bathing, dressing, using the toilet or eating? Independent  Indoor Mobility: Did the patient need assistance with walking from room to room (with or without device)?  Independent  Stairs: Did the patient need assistance with internal or external stairs (with or without device)? Independent  Functional Cognition: Did the patient need help planning regular tasks such as shopping or remembering to take medications? Independent  Patient Information Are you of Hispanic, Latino/a,or Spanish origin?: A. No, not of Hispanic, Latino/a, or Spanish origin What is your race?: A. White Do you need or want an interpreter to communicate with a doctor or health care staff?: 0. No  Patient's Response To:  Health Literacy and Transportation Is the patient able to respond to health literacy and transportation needs?: Yes Health Literacy - How often do you need to have someone help you when you read instructions, pamphlets, or other written material from your doctor or pharmacy?: Never In the past 12 months, has lack of transportation kept you from medical appointments or from getting medications?: No In the past 12 months, has lack of transportation kept you from meetings, work, or from getting things needed for daily living?: No  Development worker, international aid / [place] Devices/Equipment: None Home Equipment: None  Prior Device Use: Indicate devices/aids used by the patient prior to current illness, exacerbation or injury? None of the above  Current Functional Level Cognition  Arousal/Alertness: Lethargic Overall Cognitive Status: Difficult to assess Difficult to assess due to: Impaired communication (expressive difficulties) Current Attention Level: Focused Orientation Level: Disoriented X4 Following Commands: Follows one step commands inconsistently, Follows one step commands with increased time Safety/Judgement: Decreased awareness of safety, Decreased awareness of deficits General Comments: Lethargic initially but once sitting EOB and moving, became more awake. follows simple commands with repetition and increased time, "what do you want me to do?"  otherwise speech mostly incoherent Attention: Focused Focused Attention: Impaired Focused Attention Impairment: Verbal basic    Extremity Assessment (includes Sensation/Coordination)  Upper Extremity Assessment: Generalized weakness  Lower Extremity Assessment: Defer to PT evaluation    ADLs  Overall ADL's : Needs assistance/impaired Eating/Feeding: Minimal assistance, Set up, Sitting Eating/Feeding Details (indicate cue type and reason): attempted to assist patient with self feeding with patient resistant Grooming: Wash/dry hands, Wash/dry face, Brushing hair, Sitting, Moderate assistance Grooming Details (indicate cue type and reason): patient able to wash face and hands with multiple verbal cues.  Mod assist to brush hair Upper Body Bathing: Minimal assistance Lower Body Bathing: Moderate assistance, Sit to/from stand Lower Body Bathing Details (indicate cue type and reason): for peri area cleaning Upper Body Dressing :  Minimal assistance Upper Body Dressing Details (indicate cue type and reason): to change gown Lower Body Dressing: Moderate assistance Lower Body Dressing Details (indicate cue type and reason): to donn socks Toilet Transfer: Minimal assistance, Ambulation, Regular Toilet Toilet Transfer Details (indicate cue type and reason): verbal cues for safety and min assist for balance and walker use Toileting- Clothing Manipulation and Hygiene: Moderate assistance Toileting - Clothing Manipulation Details (indicate cue type and reason): nsg reports incontinenece of BM earlier this am Functional mobility during ADLs: Moderate assistance General ADL Comments: difficulty following directions    Mobility  Overal bed mobility: Needs Assistance Bed Mobility: Supine to Sit Sidelying to sit: Min assist, +2 for physical assistance Supine to sit: Min assist, HOB elevated Sit to supine: Min guard General bed mobility comments: ASsist with trunk to get to EOB, increased time and  cueing.    Transfers  Overall transfer level: Needs assistance Equipment used: Rolling walker (2 wheels) Transfers: Sit to/from Stand Sit to Stand: Min assist Bed to/from chair/wheelchair/BSC transfer type:: Step pivot Step pivot transfers: Min assist General transfer comment: Min A to power to standing, despite cues for hand placement, pt pulling up on RW. Stood from [employer], from chair x1. Transferred to chair post ambulation.    Ambulation / Gait / Stairs / Wheelchair Mobility  Ambulation/Gait Ambulation/Gait assistance: Mod assist, Min assist Gait Distance (Feet): 80 Feet (x2 bouts) Assistive device: Rolling walker (2 wheels) Gait Pattern/deviations: Decreased stride length, Narrow base of support, Step-through pattern, Trunk flexed General Gait Details: Slow, unsteady gait with forward flexed posture with cues for RW proximity/management as RW too far anterior, bil knee instability when fatigued. 1 seated rest break. Gait velocity: decreased Gait velocity interpretation: <1.31 ft/sec, indicative of household ambulator    Posture / Balance Dynamic Sitting Balance Sitting balance - Comments: fair sitting balance on EOB preparing to stand Balance Overall balance assessment: Needs assistance Sitting-balance support: Feet supported, No upper extremity supported Sitting balance-Leahy Scale: Fair Sitting balance - Comments: fair sitting balance on EOB preparing to stand Postural control: Posterior lean Standing balance support: During functional activity, Reliant on assistive device for balance Standing balance-Leahy Scale: Poor Standing balance comment: reliant on UE support    Special needs/care consideration Special service needs none    Previous Home Environment (from acute therapy documentation) Living Arrangements: Children (62 yo son; grandchild every other week)  Lives With: Son Available Help at Discharge: Family, Available PRN/intermittently Type of Home: House Home  Layout: One level Home Access: Stairs to enter Entrance Stairs-Rails: Right, Left Entrance Stairs-Number of Steps: 4 Bathroom Shower/Tub: Tub/shower unit (thresholds/small changes in height throughout the house) Biochemist, clinical: Standard Bathroom Accessibility: No Home Care Services: No Additional Comments: thresholds/small changes in height throughout the house  Discharge Living Setting Plans for Discharge Living Setting: Patient's home Type of Home at Discharge: House Discharge Home Layout: One level Discharge Home Access: Stairs to enter Entrance Stairs-Rails: Right, Left Entrance Stairs-Number of Steps: 4 Discharge Bathroom Shower/Tub: Tub/shower unit Discharge Bathroom Toilet: Standard Discharge Bathroom Accessibility: Yes How Accessible: Accessible via walker Does the patient have any problems obtaining your medications?: No  Social/Family/Support Systems Patient Roles: Other (Comment) Contact Information: [phone] Anticipated Caregiver's Contact Information: Edwyna Ready (son) Rotating family members to assit Ability/Limitations of Caregiver: 24/7 Caregiver Availability: 24/7 Discharge Plan Discussed with Primary Caregiver: Yes Is Caregiver In Agreement with Plan?: Yes Does Caregiver/Family have Issues with Lodging/Transportation while Pt is in Rehab?: No  Goals Patient/Family Goal for Rehab: PT/OT/SLP  supervision Expected length of stay: 10-12 days Pt/Family Agrees to Admission and willing to participate: Yes Program Orientation Provided & Reviewed with Pt/Caregiver Including Roles  & Responsibilities: Yes  Decrease burden of Care through IP rehab admission: n/a  Possible need for SNF placement upon discharge: not anticipated  Patient Condition: I have reviewed medical records from [facility] , spoken with CM, and patient, son, and family member. I met with patient at the bedside for inpatient rehabilitation assessment.  Patient will benefit  from ongoing PT, OT, and SLP, can actively participate in 3 hours of therapy a day 5 days of the week, and can make measurable gains during the admission.  Patient will also benefit from the coordinated team approach during an Inpatient Acute Rehabilitation admission.  The patient will receive intensive therapy as well as Rehabilitation physician, nursing, social worker, and care management interventions.  Due to bladder management, bowel management, safety, skin/wound care, disease management, medication administration, pain management, and patient education the patient requires 24 hour a day rehabilitation nursing.  The patient is currently min A  with mobility and basic ADLs.  Discharge setting and therapy post discharge at home with home health is anticipated.  Patient has agreed to participate in the Acute Inpatient Rehabilitation Program and will admit today.  Preadmission Screen Completed By:  Genella Mech, 04/16/2022 10:37 AM ______________________________________________________________________   Discussed status with Dr. Naaman Plummer  on 04/16/22 at 66 and received approval for admission today.  Admission Coordinator:  Genella Mech, CCC-SLP, time 04/11/22/Date 1115   Assessment/Plan: Diagnosis: ICH Does the need for close, 24 hr/day Medical supervision in concert with the patient's rehab needs make it unreasonable for this patient to be served in a less intensive setting? Yes Co-Morbidities requiring supervision/potential complications: fever, copd, dysphagia Due to bladder management, bowel management, safety, skin/wound care, disease management, medication administration, pain management, and patient education, does the patient require 24 hr/day rehab nursing? Yes Does the patient require coordinated care of a physician, rehab nurse, PT, OT, and SLP to address physical and functional deficits in the context of the above medical diagnosis(es)? Yes Addressing deficits in the following areas:  balance, endurance, locomotion, strength, transferring, bowel/bladder control, bathing, dressing, feeding, grooming, toileting, cognition, language, and psychosocial support Can the patient actively participate in an intensive therapy program of at least 3 hrs of therapy 5 days a week? Yes The potential for patient to make measurable gains while on inpatient rehab is excellent Anticipated functional outcomes upon discharge from inpatient rehab: supervision PT, supervision OT, mod assist and max assist SLP Estimated rehab length of stay to reach the above functional goals is: 10-12 days Anticipated discharge destination: Home 10. Overall Rehab/Functional Prognosis: excellent   MD Signature: Meredith Staggers, MD, [place] Director Rehabilitation Services 04/16/2022

## 2022-04-11 NOTE — Progress Notes (Signed)
Occupational Therapy Treatment Patient Details Name: [name] Giles MRN: [id number] DOB: 1955-11-01 Today's Date: 04/11/2022   History of present illness [name] Giles is a 66 y.o. female presenting 8/10 after being found on the bathroom at work, head CT revealed a large left frontotemporal ICH with IVH and 5 mm midline shift and ballooning of the temporal horns concerning for obstructive hydrocephalus s/p placement of right frontal ventriculostomy via bur hole 8/10. EVD removed 8/18. PHMx: COPD, tobacco abuse and asthma.   OT comments  Patient received in supine and agreeable to OT treatment. Patient required increased time to get to EOB due to lethargic. Patient's with wet bed and gown and was min assist to change gown. Patient assisted to recliner with min assist and verbal cues. Patient performed grooming with washing face and hand and assistance to brush hair. Patient was setup for breakfast and attempted to assist to increase intake but patient resistant on eating. Acute OT to continue to follow.    Recommendations for follow up therapy are one component of a multi-disciplinary discharge planning process, led by the attending physician.  Recommendations may be updated based on patient status, additional functional criteria and insurance authorization.    Follow Up Recommendations  Acute inpatient rehab (3hours/day)    Assistance Recommended at Discharge Frequent or constant Supervision/Assistance  Patient can return home with the following  A lot of help with walking and/or transfers;A lot of help with bathing/dressing/bathroom;Assistance with cooking/housework;Direct supervision/assist for medications management;Direct supervision/assist for financial management;Assist for transportation;Help with stairs or ramp for entrance   Equipment Recommendations  BSC/3in1    Recommendations for Other Services      Precautions / Restrictions Precautions Precautions: Fall Precaution  Comments: posey belt Restrictions Weight Bearing Restrictions: No       Mobility Bed Mobility Overal bed mobility: Needs Assistance Bed Mobility: Supine to Sit     Supine to sit: Min guard, HOB elevated     General bed mobility comments: increased time and verbal cues    Transfers Overall transfer level: Needs assistance Equipment used: Rolling walker (2 wheels) Transfers: Sit to/from Stand, Bed to chair/wheelchair/BSC Sit to Stand: Min assist     Step pivot transfers: Min assist     General transfer comment: min assist to power up and transfer to recliner from EOB     Balance Overall balance assessment: Needs assistance Sitting-balance support: Feet supported, No upper extremity supported Sitting balance-Leahy Scale: Fair Sitting balance - Comments: fair sitting balance on EOB preparing to stand   Standing balance support: Bilateral upper extremity supported Standing balance-Leahy Scale: Poor Standing balance comment: reliant on UE support for transfer                           ADL either performed or assessed with clinical judgement   ADL Overall ADL's : Needs assistance/impaired Eating/Feeding: Minimal assistance;Set up;Sitting Eating/Feeding Details (indicate cue type and reason): attempted to assist patient with self feeding with patient resistant Grooming: Wash/dry hands;Wash/dry face;Brushing hair;Sitting;Moderate assistance Grooming Details (indicate cue type and reason): patient able to wash face and hands with multiple verbal cues.  Mod assist to brush hair         Upper Body Dressing : Minimal assistance Upper Body Dressing Details (indicate cue type and reason): to change gown                   General ADL Comments: difficulty following directions  Extremity/Trunk Assessment              Vision       Perception     Praxis      Cognition Arousal/Alertness: Lethargic Behavior During Therapy: Flat affect Overall  Cognitive Status: Impaired/Different from baseline Area of Impairment: Orientation, Attention, Memory, Following commands, Safety/judgement, Awareness, Problem solving                 Orientation Level: Disoriented to, Place, Time, Situation (able to state name)   Memory: Decreased short-term memory Following Commands: Follows one step commands inconsistently, Follows one step commands with increased time Safety/Judgement: Decreased awareness of safety, Decreased awareness of deficits Awareness: Intellectual Problem Solving: Slow processing, Decreased initiation, Difficulty sequencing, Requires verbal cues, Requires tactile cues General Comments: lethargic with today's treatment.  Multiple cues to follow directions. incoherent speech        Exercises      Shoulder Instructions       General Comments      Pertinent Vitals/ Pain       Pain Assessment Pain Assessment: Faces Faces Pain Scale: No hurt Pain Intervention(s): Monitored during session  Home Living                                          Prior Functioning/Environment              Frequency  Min 2X/week        Progress Toward Goals  OT Goals(current goals can now be found in the care plan section)  Progress towards OT goals: Progressing toward goals  Acute Rehab OT Goals OT Goal Formulation: Patient unable to participate in goal setting Time For Goal Achievement: 04/16/22 Potential to Achieve Goals: Good ADL Goals Pt Will Perform Grooming: with min guard assist;standing Pt Will Perform Upper Body Dressing: with supervision;sitting Pt Will Perform Lower Body Dressing: with min assist;sit to/from stand Pt Will Transfer to Toilet: with min guard assist;ambulating;regular height toilet  Plan Discharge plan remains appropriate    Co-evaluation                 AM-PAC OT "6 Clicks" Daily Activity     Outcome Measure   Help from another person eating meals?: A  Little Help from another person taking care of personal grooming?: A Little Help from another person toileting, which includes using toliet, bedpan, or urinal?: A Lot Help from another person bathing (including washing, rinsing, drying)?: A Lot Help from another person to put on and taking off regular upper body clothing?: A Lot Help from another person to put on and taking off regular lower body clothing?: A Lot 6 Click Score: 14    End of Session Equipment Utilized During Treatment: Rolling walker (2 wheels)  OT Visit Diagnosis: Unsteadiness on feet (R26.81);Other abnormalities of gait and mobility (R26.89);Muscle weakness (generalized) (M62.81);Other symptoms and signs involving the nervous system (R29.898);Other symptoms and signs involving cognitive function   Activity Tolerance Patient limited by lethargy   Patient Left in chair;with chair alarm set;with restraints reapplied   Nurse Communication Mobility status        Time: 1610-9604 OT Time Calculation (min): 31 min  Charges: OT General Charges $OT Visit: 1 Visit OT Treatments $Self Care/Home Management : 23-37 mins  Lodema Hong, [place]  Office [phone]   Trixie Dredge 04/11/2022, 9:12 AM

## 2022-04-11 NOTE — Progress Notes (Addendum)
PROGRESS NOTE    [name] Giles  FIE:[id number] DOB: Mar 27, 1956 DOA: 03/29/2022 PCP: Redmond School, MD   Brief Narrative:  [name] Giles is a 66 y.o. 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 Patient was initially admitted to the ICU for management.  Patient underwent ventriculostomy secondary to hydrocephalus which has improved. PT/OT recommended acute rehabilitation. Patient did have delirium during hospitalization continues to have delirium at this time..     Assessment and Plan: Principal Problem:   Intracerebral hemorrhage ([place]) Active Problems:   Delirium   Essential hypertension   Hyperlipidemia   Temporal intracerebral hemorrhage As per neurology likely secondary to dural  AVF.  Patient initially admitted to ICU under stroke service and was noted to have large left temporal and frontal intracranial hemorrhage with intraventricular extension and left uncal herniation and midline shift of 5 mm with 2 hour interval increase of left IPH and new IPH within the left uncus/medial temporal lobe. MRI was then performed with similar findings in addition to increased midline shift to 7 mm and repeat CT head (8/11) significant for increased midline shift to 10 mm. CTA head/neck (8/11) significant for AVF/AVM of left temporal lobe.  Patient underwent ventriculostomy during hospitalization.  CT head (8/12) was significant for stabilized lesions. LDL of 110, hemoglobin A1C of 5.7% and 2D Echocardiogram significant for LVEF of 60-65% with no atrial level shunt identified and no thrombus identified. PT/OT recommending acute inpatient rehabilitation.  Cerebral edema/Obstructive hydrocephalus Neurosurgery was consulted patient was initially put on 3% hypertonic saline from 8/10 /23 until 03/31/22. Right frontal ventriculostomy was placed on 03/29/22  by neurosurgery and was removed on 04/06/22.  Delirium Initially needed Precedex in the ICU which was weaned off.  Patient still appears to be delirious confused and disoriented.  Bladder scan done without any significant urinary retention.  Encouraged Tylenol suppository for possible pain.  We will continue supportive care.  Avoid sedative hypnotics if possible.  Off restraints at this time.  Left temporal AVF/AVM Likely source of hemorrhage. Neurosurgery recommending angiogram which will need to be performed when medically stable and not confused.  Hypertension Previous history of hypertension.  Was initially on Cleviprex drip.  On low-dose amlodipine 2.5 mg daily at this time.  Hyperlipidemia LDL of 119.  Has been started on Crestor  Dysphagia Initially on cortrak tube tube.  Currently on regular diet.  Tobacco use Not on nicotine replacement at this time.  Hypokalemia Improved with replacement.  Latest potassium of 4.0.  Replace as necessary.  Hypernatremia Improved at this time.  Latest sodium of 141.  Prediabetes Hemoglobin A1C calculated to be 5.7%.  COPD Supposed to be on Symbicort and albuterol was not not taking. Currently no exacerbation. -Continue Pulmicort and Brovana, Duoneb PRN  DVT prophylaxis: Heparin subq  Code Status:   Code Status: Full Code  Family Communication:  None at bedside.  Disposition Plan:  Plan for for inpatient rehab when stable.  Still delirious and unable to follow commands..  Consultants:  Neurology/Stroke Neurosurgery PCCM  Procedures:  Ventriculostomy NG tube placement and removal  Antimicrobials: None  Subjective: Today, patient was seen and examined at bedside.  Patient appears to be confused disoriented and delirious.  As per nursing has not eaten anything this morning.  Moving all extremities.  Not answering asked questions or follow commands.  Objective: BP 137/84 (BP Location:  Left Arm)   Pulse (!) 104   Temp (!)  101.5 F (38.6 C) (Oral)   Resp 19   Ht '5\' 5"'$  (1.651 m)   Wt 66.2 kg   SpO2 94%   BMI 24.29 kg/m   Body mass index is 24.29 kg/m.   General:  Average built, not in obvious distress, HENT:   No scleral pallor or icterus noted. Oral mucosa is moist.  Chest:    Diminished breath sounds bilaterally. No crackles or wheezes.  CVS: S1 &S2 heard. No murmur.  Regular rate and rhythm. Abdomen: Soft, nontender, nondistended.  Bowel sounds are heard.   Extremities: No cyanosis, clubbing or edema.  Peripheral pulses are palpable. Psych: Awake but disoriented confused and unable to answer appropriately.  Incomprehensible speech. CNS: Moves extremities.  incomprehensible speech. Skin: Warm and dry.  No rashes noted.  Data Reviewed:   I have personally reviewed the following labs and imaging studies.    CBC Lab Results  Component Value Date   WBC 12.4 (H) 04/11/2022   RBC 4.51 04/11/2022   HGB 13.8 04/11/2022   HCT 39.9 04/11/2022   MCV 88.5 04/11/2022   MCH 30.6 04/11/2022   PLT 368 04/11/2022   MCHC 34.6 04/11/2022   RDW 13.2 04/11/2022   LYMPHSABS 2.3 04/05/2022   MONOABS 1.2 (H) 04/05/2022   EOSABS 0.0 04/05/2022   BASOSABS 0.0 51/70/0174     Last metabolic panel Lab Results  Component Value Date   NA 136 04/11/2022   K 3.7 04/11/2022   CL 101 04/11/2022   CO2 27 04/11/2022   BUN 13 04/11/2022   CREATININE 0.73 04/11/2022   GLUCOSE 118 (H) 04/11/2022   GFRNONAA >60 04/11/2022   GFRAA >60 12/03/2019   CALCIUM 9.0 04/11/2022   PHOS 3.4 04/02/2022   PROT 6.0 (L) 04/05/2022   ALBUMIN 3.0 (L) 04/05/2022   BILITOT 0.6 04/05/2022   ALKPHOS 72 04/05/2022   AST 20 04/05/2022   ALT 26 04/05/2022   ANIONGAP 8 04/11/2022    GFR: Estimated Creatinine Clearance: 62.2 mL/min (by C-G formula based on SCr of 0.73 mg/dL).  No results found for this or any previous visit (from the past 240 hour(s)).     Radiology Studies: No results found.    LOS: 13 days    Flora Lipps, MD [facility] 04/11/2022, 1:27 PM If 7PM-7AM, please contact night-coverage [email or web address]

## 2022-04-11 NOTE — Progress Notes (Signed)
Patient refusing CBG check during her sleep, ate snacks at bed time.

## 2022-04-11 NOTE — Progress Notes (Signed)
PT Cancellation Note  Patient Details Name: [name] Giles MRN: [id number] DOB: 1956-03-18   Cancelled Treatment:    Reason Eval/Treat Not Completed: Patient not medically ready.  Mid day, unable to awaken.  Late day, pt just awaken and in a stupor.  Husband and I agreed she wouldn't do well at this level of arousal. 04/11/2022  Ginger Carne., PT Acute Rehabilitation Services [phone]  (pager) [phone]  (office)   [name] Giles 04/11/2022, 5:27 PM

## 2022-04-12 ENCOUNTER — Inpatient Hospital Stay (HOSPITAL_COMMUNITY): Payer: 59

## 2022-04-12 DIAGNOSIS — I10 Essential (primary) hypertension: Secondary | ICD-10-CM | POA: Diagnosis not present

## 2022-04-12 DIAGNOSIS — E785 Hyperlipidemia, unspecified: Secondary | ICD-10-CM | POA: Diagnosis not present

## 2022-04-12 DIAGNOSIS — R410 Disorientation, unspecified: Secondary | ICD-10-CM | POA: Diagnosis not present

## 2022-04-12 DIAGNOSIS — R509 Fever, unspecified: Secondary | ICD-10-CM

## 2022-04-12 DIAGNOSIS — I611 Nontraumatic intracerebral hemorrhage in hemisphere, cortical: Secondary | ICD-10-CM | POA: Diagnosis not present

## 2022-04-12 DIAGNOSIS — R4182 Altered mental status, unspecified: Secondary | ICD-10-CM | POA: Diagnosis not present

## 2022-04-12 DIAGNOSIS — D72829 Elevated white blood cell count, unspecified: Secondary | ICD-10-CM

## 2022-04-12 LAB — URINALYSIS, COMPLETE (UACMP) WITH MICROSCOPIC
Bacteria, UA: NONE SEEN
Bilirubin Urine: NEGATIVE
Glucose, UA: NEGATIVE mg/dL
Hgb urine dipstick: NEGATIVE
Ketones, ur: NEGATIVE mg/dL
Leukocytes,Ua: NEGATIVE
Nitrite: NEGATIVE
Protein, ur: NEGATIVE mg/dL
Specific Gravity, Urine: 1.024 (ref 1.005–1.030)
pH: 5 (ref 5.0–8.0)

## 2022-04-12 LAB — GLUCOSE, CAPILLARY
Glucose-Capillary: 145 mg/dL — ABNORMAL HIGH (ref 70–99)
Glucose-Capillary: 119 mg/dL — ABNORMAL HIGH (ref 70–99)
Glucose-Capillary: 148 mg/dL — ABNORMAL HIGH (ref 70–99)
Glucose-Capillary: 138 mg/dL — ABNORMAL HIGH (ref 70–99)

## 2022-04-12 LAB — URINALYSIS, ROUTINE W REFLEX MICROSCOPIC
Bilirubin Urine: NEGATIVE
Glucose, UA: NEGATIVE mg/dL
Hgb urine dipstick: NEGATIVE
Ketones, ur: 5 mg/dL — AB
Leukocytes,Ua: NEGATIVE
Nitrite: NEGATIVE
Protein, ur: NEGATIVE mg/dL
Specific Gravity, Urine: 1.024 (ref 1.005–1.030)
pH: 5 (ref 5.0–8.0)

## 2022-04-12 LAB — BASIC METABOLIC PANEL
Anion gap: 11 (ref 5–15)
BUN: 18 mg/dL (ref 8–23)
CO2: 24 mmol/L (ref 22–32)
Calcium: 9.2 mg/dL (ref 8.9–10.3)
Chloride: 103 mmol/L (ref 98–111)
Creatinine, Ser: 0.75 mg/dL (ref 0.44–1.00)
GFR, Estimated: >60 mL/min (ref >60)
Glucose, Bld: 132 mg/dL — ABNORMAL HIGH (ref 70–99)
Potassium: 3.6 mmol/L (ref 3.5–5.1)
Sodium: 138 mmol/L (ref 135–145)

## 2022-04-12 LAB — CBC
HCT: 38.7 % (ref 36.0–46.0)
Hemoglobin: 13.3 g/dL (ref 12.0–15.0)
MCH: 30.3 pg (ref 26.0–34.0)
MCHC: 34.4 g/dL (ref 30.0–36.0)
MCV: 88.2 fL (ref 80.0–100.0)
Platelets: 344 10*3/uL (ref 150–400)
RBC: 4.39 MIL/uL (ref 3.87–5.11)
RDW: 13.3 % (ref 11.5–15.5)
WBC: 18.6 10*3/uL — ABNORMAL HIGH (ref 4.0–10.5)
nRBC: 0 % (ref 0.0–0.2)

## 2022-04-12 MED ORDER — VANCOMYCIN HCL 750 MG/150ML IV SOLN
750.0000 mg | Freq: Two times a day (BID) | INTRAVENOUS | Status: DC
  Administered 2022-04-13 – 2022-04-16 (×7): 750 mg via INTRAVENOUS
  Filled 2022-04-12 (×7): qty 150

## 2022-04-12 MED ORDER — SODIUM CHLORIDE 0.9 % IV SOLN
2.0000 g | Freq: Two times a day (BID) | INTRAVENOUS | Status: DC
  Administered 2022-04-12 – 2022-04-16 (×8): 2 g via INTRAVENOUS
  Filled 2022-04-12 (×10): qty 20

## 2022-04-12 MED ORDER — VANCOMYCIN HCL 1500 MG/300ML IV SOLN
1500.0000 mg | Freq: Once | INTRAVENOUS | Status: AC
  Administered 2022-04-12: 1500 mg via INTRAVENOUS
  Filled 2022-04-12: qty 300

## 2022-04-12 NOTE — Progress Notes (Signed)
Pharmacy Antibiotic Note  [name] Giles is a 66 y.o. 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  Pharmacy has been consulted for Vancomycin dosing for empiric meningitis treatment.  Plan: Vancomycin loading dose 1500 mg IV x1  then 750 mg IV q12h . Target  vancomycin trough 15-20 mcg/l)  MD dosing Ceftriaxone 2g IV q12h  Monitor clinical status, renal function and culture results daily.   Vancomycin trough per protocol at steady state.   Height: '5\' 5"'$  (165.1 cm) Weight: 66.2 kg (145 lb 15.1 oz) IBW/kg (Calculated) : 57  Temp (24hrs), Avg:100 F (37.8 C), Min:98.8 F (37.1 C), Max:101.1 F (38.4 C)  Recent Labs  Lab 04/08/22 0611 04/09/22 0355 04/10/22 0402 04/11/22 0816 04/12/22 0317  WBC 11.6* 12.2* 11.5* 12.4* 18.6*  CREATININE 0.63 0.57 0.69 0.73 0.75    Estimated Creatinine Clearance: 62.2 mL/min (by C-G formula based on SCr of 0.75 mg/dL).    No Known Allergies  Antimicrobials this admission: CTX 8/24 >> Vanc 8/24>>  Dose adjustments this admission:  Microbiology results: 8/24 BCx x2: sent 8/24 BCx x2: sent  8/10 MRSA PCR: neg 8/10 Covid pcr: neg:  Flu: neg   Thank you for allowing pharmacy to be a part of this patient's care. Nicole Cella, RPh Clinical Pharmacist 04/12/2022 2:22 PM  Please check AMION for all [place] phone numbers After 10:00 PM, call [place] [phone]   04/12/2022 2:19 PM

## 2022-04-12 NOTE — Progress Notes (Signed)
PT Cancellation Note  Patient Details Name: [name] Giles MRN: [id number] DOB: Jan 13, 1956   Cancelled Treatment:    Reason Eval/Treat Not Completed: Patient at procedure or test/unavailable (pt off unit for swallow study per RN. will follow up at later date/time as schedule allows.)  Gwynneth Albright PT, DPT Acute Rehabilitation Services Office [phone] Pager [phone]  04/12/22 2:07 PM

## 2022-04-12 NOTE — Plan of Care (Signed)
  Problem: Education: Goal: Knowledge of General Education information will improve Description: Including pain rating scale, medication(s)/side effects and non-pharmacologic comfort measures Outcome: Progressing   Problem: Health Behavior/Discharge Planning: Goal: Ability to manage health-related needs will improve Outcome: Progressing   Problem: Clinical Measurements: Goal: Ability to maintain clinical measurements within normal limits will improve Outcome: Progressing Goal: Will remain free from infection Outcome: Progressing Goal: Diagnostic test results will improve Outcome: Progressing Goal: Respiratory complications will improve Outcome: Progressing Goal: Cardiovascular complication will be avoided Outcome: Progressing   Problem: Activity: Goal: Risk for activity intolerance will decrease Outcome: Progressing   Problem: Nutrition: Goal: Adequate nutrition will be maintained Outcome: Progressing   Problem: Coping: Goal: Level of anxiety will decrease Outcome: Progressing   Problem: Elimination: Goal: Will not experience complications related to bowel motility Outcome: Progressing Goal: Will not experience complications related to urinary retention Outcome: Progressing   Problem: Pain Managment: Goal: General experience of comfort will improve Outcome: Progressing   Problem: Safety: Goal: Ability to remain free from injury will improve Outcome: Progressing   Problem: Skin Integrity: Goal: Risk for impaired skin integrity will decrease Outcome: Progressing   Problem: Education: Goal: Knowledge of disease or condition will improve Outcome: Progressing Goal: Knowledge of secondary prevention will improve (SELECT ALL) Outcome: Progressing Goal: Knowledge of patient specific risk factors will improve (INDIVIDUALIZE FOR PATIENT) Outcome: Progressing Goal: Individualized Educational Video(s) Outcome: Progressing   Problem: Coping: Goal: Will verbalize  positive feelings about self Outcome: Progressing Goal: Will identify appropriate support needs Outcome: Progressing   Problem: Health Behavior/Discharge Planning: Goal: Ability to manage health-related needs will improve Outcome: Progressing   Problem: Self-Care: Goal: Ability to participate in self-care as condition permits will improve Outcome: Progressing Goal: Verbalization of feelings and concerns over difficulty with self-care will improve Outcome: Progressing Goal: Ability to communicate needs accurately will improve Outcome: Progressing   Problem: Nutrition: Goal: Risk of aspiration will decrease Outcome: Progressing Goal: Dietary intake will improve Outcome: Progressing   Problem: Intracerebral Hemorrhage Tissue Perfusion: Goal: Complications of Intracerebral Hemorrhage will be minimized Outcome: Progressing   Problem: Safety: Goal: Non-violent Restraint(s) Outcome: Progressing

## 2022-04-12 NOTE — Progress Notes (Signed)
Inpatient Rehabilitation Admissions Coordinator   Noted medical workup ongoing. We await medical clearance to admit to CIR. We will follow up tomorrow.  Danne Baxter, RN, MSN Rehab Admissions Coordinator [phone] 04/12/2022 12:07 PM

## 2022-04-12 NOTE — Progress Notes (Addendum)
PROGRESS NOTE    [name] Giles  MOQ:[id number] DOB: Oct 24, 1955 DOA: 03/29/2022 PCP: Redmond School, MD   Brief Narrative:  [name] Giles is a 66 y.o. 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 Patient was initially admitted to the ICU for management.  Patient underwent ventriculostomy secondary to hydrocephalus which has improved. PT/OT recommended acute rehabilitation. Patient did have delirium during hospitalization continues to have delirium at this time.  At this time, patient has also spiked a fever and fever work-up is unremarkable.  Assessment and Plan: Principal Problem:   Intracerebral hemorrhage ([place]) Active Problems:   Delirium   Essential hypertension   Hyperlipidemia   Temporal intracerebral hemorrhage As per neurology likely secondary to dural  AVF.  Patient initially admitted to ICU under stroke service and was noted to have large left temporal and frontal intracranial hemorrhage with intraventricular extension and left uncal herniation and midline shift of 5 mm with 2 hour interval increase of left IPH and new IPH within the left uncus/medial temporal lobe. MRI was then performed with similar findings in addition to increased midline shift to 7 mm and repeat CT head (8/11) significant for increased midline shift to 10 mm. CTA head/neck (8/11) significant for AVF/AVM of left temporal lobe.  Patient underwent ventriculostomy during hospitalization.  CT head (8/12) was significant for stabilized lesions. LDL of 110, hemoglobin A1C of 5.7% and 2D Echocardiogram significant for LVEF of 60-65% with no atrial level shunt identified and no thrombus identified. PT/OT recommending acute inpatient rehabilitation.  Cerebral edema/Obstructive hydrocephalus Neurosurgery was consulted patient was initially put on 3% hypertonic saline from  8/10 /23 until 03/31/22. Right frontal ventriculostomy was placed on 03/29/22 by neurosurgery and was removed on 04/06/22.  Delirium Patient has had fever of 102.2 F and has trending up leukocytosis.  There is concern for aspiration due to decreased mental status but will rule out other infections.  Chest x-ray negative.  Urinalysis negative.  Follow blood cultures.  Check LFTs in AM.   Add aspiration precautions.  Initially needed Precedex in the ICU which was weaned off.  Bladder scan done without any significant urinary retention.  Encouraged Tylenol suppository for possible pain.  We will continue supportive care.  Avoid sedative hypnotics if possible.  Off restraints at this time.  No evidence of suture line infection.  We will add a CT scan of the head to assess for intracranial condition.  We will communicated with neurosurgery and neurology regarding delirium.  Left temporal AVF/AVM Likely source of hemorrhage. Neurosurgery recommending angiogram which will need to be performed when medically stable and not confused.  Hypertension Previous history of hypertension.  Was initially on Cleviprex drip.  On low-dose amlodipine 2.5 mg daily at this time.  Hyperlipidemia LDL of 119.  Has been started on Crestor  Dysphagia Initially on cortrak tube tube.  Currently on regular diet, needs assistance with feeding..  Tobacco use Not on nicotine replacement at this time.  Hypokalemia Improved with replacement.  Latest potassium of 3.6.  Replace as necessary.  Hypernatremia Improved at this time.  Latest sodium of 138.  Prediabetes Hemoglobin A1C calculated to be 5.7%.  COPD Supposed to be on Symbicort and albuterol was not not taking. Currently no exacerbation. -Continue Pulmicort and Brovana, Duoneb PRN  DVT prophylaxis: Heparin subq  Code Status:   Code Status: Full Code  Family  Communication:  Spoke with the patient's sister on the phone and updated her about the clinical condition  of the patient.  Disposition Plan:  Plan for for inpatient rehab when stable.  Still delirious and unable to follow commands.  New fever which needs work-up.  Consultants:  Neurology/Stroke Neurosurgery PCCM  Procedures:  Ventriculostomy NG tube placement and removal  Antimicrobials: None  Subjective: Today, patient was seen and examined at bedside.  Little more alert than yesterday but confused disoriented.  Temperature max noted at 100.2 F.  Eating some with assistance.  incomprehensible speech, not following commands.    Objective: BP (!) 145/72 (BP Location: Right Arm)   Pulse (!) 110   Temp 98.8 F (37.1 C) (Oral)   Resp 20   Ht '5\' 5"'$  (1.651 m)   Wt 66.2 kg   SpO2 99%   BMI 24.29 kg/m   Body mass index is 24.29 kg/m.   General:  Average built, not in obvious distress HENT:   No scleral pallor or icterus noted. Oral mucosa is moist.  Scalp sutures in place. Chest:    Diminished breath sounds bilaterally. No crackles or wheezes.  CVS: S1 &S2 heard. No murmur.  Regular rate and rhythm. Abdomen: Soft, nontender, nondistended.  Bowel sounds are heard.   Extremities: No cyanosis, clubbing or edema.  Peripheral pulses are palpable. Psych: Alert, awake but confused and disoriented, incomprehensible speech, not following commands, CNS: incomprehensible speech, awake, moving extremities. Skin: Warm and dry.  No rashes noted.  Scalp sutures.  Data Reviewed:   I have personally reviewed the following labs and imaging studies.    CBC Lab Results  Component Value Date   WBC 18.6 (H) 04/12/2022   RBC 4.39 04/12/2022   HGB 13.3 04/12/2022   HCT 38.7 04/12/2022   MCV 88.2 04/12/2022   MCH 30.3 04/12/2022   PLT 344 04/12/2022   MCHC 34.4 04/12/2022   RDW 13.3 04/12/2022   LYMPHSABS 2.3 04/05/2022   MONOABS 1.2 (H) 04/05/2022   EOSABS 0.0 04/05/2022   BASOSABS 0.0 50/04/3817     Last metabolic panel Lab Results  Component Value Date   NA 138 04/12/2022   K  3.6 04/12/2022   CL 103 04/12/2022   CO2 24 04/12/2022   BUN 18 04/12/2022   CREATININE 0.75 04/12/2022   GLUCOSE 132 (H) 04/12/2022   GFRNONAA >60 04/12/2022   GFRAA >60 12/03/2019   CALCIUM 9.2 04/12/2022   PHOS 3.4 04/02/2022   PROT 6.0 (L) 04/05/2022   ALBUMIN 3.0 (L) 04/05/2022   BILITOT 0.6 04/05/2022   ALKPHOS 72 04/05/2022   AST 20 04/05/2022   ALT 26 04/05/2022   ANIONGAP 11 04/12/2022    GFR: Estimated Creatinine Clearance: 62.2 mL/min (by C-G formula based on SCr of 0.75 mg/dL).  No results found for this or any previous visit (from the past 240 hour(s)).    Radiology Studies: DG CHEST PORT 1 VIEW  Result Date: 04/12/2022 CLINICAL DATA:  Fever EXAM: PORTABLE CHEST 1 VIEW COMPARISON:  Previous studies including the examination of 04/07/2022 FINDINGS: Cardiac size is within normal limits. Lung fields are clear of any pulmonary edema or new focal infiltrates. Small linear densities in left lower lung field may suggest minimal subsegmental atelectasis. There is interval removal of enteric tube. There is no pleural effusion or pneumothorax. IMPRESSION: There are no signs of pulmonary edema or focal pulmonary consolidation. Electronically Signed   By: Elmer Picker M.D.   On: 04/12/2022 08:21  LOS: 14 days    Flora Lipps, MD [facility] 04/12/2022, 11:20 AM If 7PM-7AM, please contact night-coverage [email or web address]

## 2022-04-12 NOTE — Progress Notes (Signed)
Modified Barium Swallow Progress Note  Patient Details  Name: [name] Giles MRN: [id number] Date of Birth: May 18, 1956  Today's Date: 04/12/2022  Modified Barium Swallow completed.  Full report located under Chart Review in the Imaging Section.  Brief recommendations include the following:  Clinical Impression  Pt has demonstrated a decline in swallow function since 8/18. Pt exhibited oropharyngeal dysphagia characterized by reduced bolus awareness, impaired mastication, and a pharyngeal delay. She demonstrated intermittent oral holding, and prolonged mastication which necessitated cues for initiation and swallowing. The swallow was often triggered with solid boluses at the level of the valleculae or pyriforms and cues were intermittently necessary for pt to swallow solids. Trace aspiration (PAS 7) was noted once with consecutive swallows of thin liquids via straw secondary to the pharyngeal delay and pt's independent coughing expelled the aspirate. However, aspiration could not be replicated despite provision of additional boluses of thin liquids via straw. Pt consistently spat out the pill when it was given in puree and with thin liquids. Coughing continues to be noted in the absence of aspiration as well so SLP suspects that all coughing at bedside should not be attributed to aspiration especially if pt's mentation is at baseline. Considering pt's now impaired bolus awareness and mastication, a dysphagia 1 diet with thin liquids will be initiated with strict observance of swallowing precautions. SLP will continue to follow pt.   Swallow Evaluation Recommendations       SLP Diet Recommendations: Thin liquid;Dysphagia 1 (Puree) solids   Liquid Administration via: Cup;No straw   Medication Administration: Crushed with puree   Supervision: Full assist for feeding;Full supervision/cueing for compensatory strategies   Compensations: Minimize environmental distractions;Slow rate;Small  sips/bites;Follow solids with liquid   Postural Changes: Remain semi-upright after after feeds/meals (Comment);Seated upright at 90 degrees   Oral Care Recommendations: Oral care BID      Sheriece Jefcoat I. Hardin Negus, [place], [place] Office number [phone]   Horton Marshall 04/12/2022,3:52 PM

## 2022-04-12 NOTE — Progress Notes (Signed)
Speech Language Pathology Treatment: Dysphagia  Patient Details Name: [name] Giles MRN: [id number] DOB: 1956-07-19 Today's Date: 04/12/2022 Time: 4098-1191 SLP Time Calculation (min) (ACUTE ONLY): 18 min  Assessment / Plan / Recommendation Clinical Impression  Pt was seen for treatment. SLP was contacted by neurology regarding pt's increased lethargy and febrility. Pt's sister was present and reported that the pt demonstrated congested coughing with lunch yesterday and that the pt was more lethargic then than she currently is. Pt's level of alertness was adequate for p.o. intake, but suboptimal and no verbal output was noted today which is a notable change compared to her typical verbosity with reduced functional intent. Pt demonstrated coughing at baseline as well as with thin, nectar, and honey thick liquids. Pt tolerated puree solids without overt s/s of aspiration but mild oral holding was noted. Pt does have a known cough which prompted the MBS on 8/18, but this appears to be more congested than when she was last seen by SLP on that date. Considering pt's change in mentation combined with her coughing, and febrile state, SLP recommends repeating a modified barium swallow study to reassess swallow physiology. It is currently scheduled for today at 1330.    HPI HPI: Pt is a 66 y/o female who presented with headache, N/V, and AMS. CTH revealed a large L frontotemporal ICH with IVH and 21m midline shift. Repeat head CT with increasing hemorrhage and associated hydrocephalus. Pt s/p ventriculostomy 8/10. PMH: COPD, tobacco abuse, asthma.      SLP Plan  MBS      Recommendations for follow up therapy are one component of a multi-disciplinary discharge planning process, led by the attending physician.  Recommendations may be updated based on patient status, additional functional criteria and insurance authorization.    Recommendations  Diet recommendations:  (pending results of  MBS) Medication Administration: Crushed with puree                Oral Care Recommendations: Oral care BID Follow Up Recommendations: Skilled nursing-short term rehab (<3 hours/day) Assistance recommended at discharge: Frequent or constant Supervision/Assistance SLP Visit Diagnosis: Dysphagia, unspecified (R13.10) Plan: MBS         Cherri Yera I. PHardin Negus MBryn [place] CBrutusOffice number [phone]  SHorton Marshall 04/12/2022, 1:29 PM

## 2022-04-12 NOTE — Progress Notes (Addendum)
STROKE TEAM PROGRESS NOTE   INTERVAL HISTORY Febrile overnight (T101), downtrending. BP systolics 938H. HR 110s. WBC 18.6. UA and CXR unremarkable. Bcx pending.   Patient alert but not following commands. Strength testing unchanged from prior. Decreased language compared to prior. Sister at bedside. Discussed concern for infectious process and starting antibiotics. Sister is agreeable.   CTH with expected evolution of left temporal lobe hemorrhage, stable intraventricular hemorrhage, decreased mass effect with decreasing edema around the hemorrhage and decreasing midline shift.    Vitals:   04/12/22 0743 04/12/22 0745 04/12/22 0854 04/12/22 1201  BP:   (!) 145/72 (!) 152/80  Pulse:   (!) 110 (!) 107  Resp:   20 20  Temp:   98.8 F (37.1 C) 100 F (37.8 C)  TempSrc:   Oral Oral  SpO2: 98% 100% 99% 99%  Weight:      Height:       CBC:  Recent Labs  Lab 04/11/22 0816 04/12/22 0317  WBC 12.4* 18.6*  HGB 13.8 13.3  HCT 39.9 38.7  MCV 88.5 88.2  PLT 368 829   Basic Metabolic Panel:  Recent Labs  Lab 04/11/22 0816 04/12/22 0317  NA 136 138  K 3.7 3.6  CL 101 103  CO2 27 24  GLUCOSE 118* 132*  BUN 13 18  CREATININE 0.73 0.75  CALCIUM 9.0 9.2   Lipid Panel:  No results for input(s): "CHOL", "TRIG", "HDL", "CHOLHDL", "VLDL", "LDLCALC" in the last 168 hours.  HgbA1c:  No results for input(s): "HGBA1C" in the last 168 hours.  Urine Drug Screen:  No results for input(s): "LABOPIA", "COCAINSCRNUR", "LABBENZ", "AMPHETMU", "THCU", "LABBARB" in the last 168 hours.   Alcohol Level  No results for input(s): "ETH" in the last 168 hours.   IMAGING past 24 hours DG CHEST PORT 1 VIEW  Result Date: 04/12/2022 CLINICAL DATA:  Fever EXAM: PORTABLE CHEST 1 VIEW COMPARISON:  Previous studies including the examination of 04/07/2022 FINDINGS: Cardiac size is within normal limits. Lung fields are clear of any pulmonary edema or new focal infiltrates. Small linear densities in left  lower lung field may suggest minimal subsegmental atelectasis. There is interval removal of enteric tube. There is no pleural effusion or pneumothorax. IMPRESSION: There are no signs of pulmonary edema or focal pulmonary consolidation. Electronically Signed   By: Elmer Picker M.D.   On: 04/12/2022 08:21    PHYSICAL EXAM GEN- Acutely distressed. Seen with sister at bedside. Has occasional chills and nuchal rigidity.  Respiratory- Normal work of breathing with increased coughing.  Neuro: She is alert, unable to follow commands, repeat, or comprehend. Lateral gaze intact. PERRL, face symmetric at rest. Lifts BUE and BLE against gravity. RUE 4/5 strength compared to LUE.     ASSESSMENT/PLAN 66 year old female with history of COPD, smoker admitted for headache, nausea, vomiting, found down in bathroom with decreased level consciousness.  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  CT repeat showed mild increase in size. NSGY consulted. Dr. Arnoldo Morale on board. Status post EVD. MRI with and without contrast showed 68 cc ICH, with IVH and midline shift 68m.  Repeat CT showed stable hematoma but midline shift up to 1 cm.  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  CTV no this thrombosis.  EF 60 to 65%, LDL 119, A1c 5.7, UDS negative. Re-consulted due to patient febrile overnight (T101). BP systolics 1937J HR 110s. WBC  18.6. Patient also has nuchal rigidity and increased coughing. Patient's infectious etiology could be due to aspiration PNA with increased coughing. Nuchal rigidity could be consistent with CNS infection or from prior bleeding from hemorrhage. With current negative CXR and concern for CNS infection, would recommend starting abx and continue monitoring patient.   Encephalopathy with fever and cough T101 overnight. HR 110s. WBC 18.6. Leukocytosis WBC 11.5->12.4->18.6 Suspect aspiration PNA 2/2 increased  coughing. CXR unremarkable  CTH unremarkable.  UA neg LP contraindicated due to [place] and midline shift   Recommend starting abx for CNS coverage which will also cover aspiration PNA  If not improving can consider MRI wwout contrast to r/o ventriculitis Bcx pending   ICH: large left temporal ICH, IVH with hydrocephalus, likely due to dural AVF  Code Stroke CT head left large temporal and frontal ICH and IVH.  Impending left uncal herniation.  Midline shift 5 mm Repeat HCT 2 hours after Interval increase in size of Left IPH. New IPH within the left uncus/medial temporal lobe.  Last CT repeat 8/18 shows stable hematoma with midline shift 1 cm MRI  03/29/22 - 68 mL intraparenchymal hemorrhage centered at the left temporal lobe, similar in size as compared to prior CT. midline shift 7 mm CT repeat 03/30/22 stable hematoma but increased midline shift to 1 cm CTA Head and Neck Cluster of abnormal, tortuous vessels along the lateral aspect of the left temporal lobe compatible with an AVF for AVM.  CT venogram negative for venous thrombosis Repeat HCT 8/12 showed Stable hematoma and ventricle size, stable intracranial mass effect with up to 9 mm MLS.  CT head 8/18: stable hematoma and EVD.  Blood has cleared the 3rd and 4th ventricles since 03/31/2022. CT head 8/24: expected evolution of left temporal lobe hemorrhage, stable intraventricular hemorrhage, decreased mass effect with decreasing edema around the hemorrhage and decreasing midline shift 2D Echo EF 60-65%, No shunt or thrombus detected LDL 119 HgbA1c 5.7 VTE prophylaxis - Heparin subcu No antiplatelet or anticoagulants prior to admission per chart review, now on no antithrombotics due to [place]. Therapy recommendations: CIR.  Disposition:  TBD  Cerebral Edema Obstructive hydrocephalus 8/12: stopped 3% HTS Na 148 this AM EVD placed -> 8/18 Ventriculostomy removed.  CT 8/24 stable midline shift  Left temporal AVF/ AVM Neurosurgery  following CTA more clearly showing this as likely source of hemorrhage Per NSGY, angiogram attempted 8/16 however was aborted and was nondiagnostic and limited by patient movement.  Repeat angiogram initially planned before discharge from CIR per Dr. Kathyrn Sheriff. However, patient now most likely need to go to SNF. Discussed with Dr. Kathyrn Sheriff, he will see pt 3-4 weeks after discharge.   HTN No hx of hypertension or home BP meds prior to admission Stable now Stopped norvasc '10mg'$  in the setting of hypotensive events overnight 8/19.  8/21: restarted amlodipine 2.'5mg'$    Systolic BP <657  Long term BP goal normotensive.  Hyperlipidemia Home meds: None  LDL 119, goal < 70 On Crestor 20 Continue statin on discharge  Dysphagia Core track was removed 8/18 Passed MBS, recommended regular solid, thin liquid diet. 8/24 SLP to re-evaluate patient due to concern for aspiration PNA. Recommending thin liquid but puree diet  Urinary retention Foley catheter in place, high risk for urinary retention due to AMS Had bladder scan with 165m overnight, given LR 5090mbolus On bethanechol '10mg'$  TID   Tobacco abuse Current smoker 1PPD, 30 pack year hx Smoking cessation counseling will be provided  Other Stroke  Risk Factors Advanced Age >/= 3  Current ETOH use, alcohol level <10, will be advised to drink no more than 1 drink a day Incidentally noted previous cerebellar stroke   Hypokalemia Initially repleting with IV K, however changed to PO due to patient pain  Repleting with Kclor x2.   Other Active Problems COPD/asthma  Hospital day # 14  Rolanda Lundborg, MD, PGY-1   04/12/2022 12:34 PM  ATTENDING NOTE: I reviewed above note and agree with the assessment and plan. Pt was seen and examined.   Neurology was called back for this patient due to encephalopathy.  Patient sister at bedside.  Patient lying in bed, very lethargic, barely open eyes on voice.  With repetitive stimulation, she was  able to open eyes briefly, not follow commands, global aphasia with intermittent word Salad.  Moving extremities bilaterally. Mild to moderate nuchal rigidity.  Patient had fever since yesterday, Tmax 102.2.  Patient also has worsening leukocytosis, tachycardia and encephalopathy.  Patient does have frequent cough, concerning for pneumonia, bronchitis or aspiration, however CXR so far unremarkable.  UA negative, blood culture pending.  CT repeat stable hematoma and midline shift.  The mild to moderate nuchal rigidity likely due to IVH, less likely due to CNS infection.  Patient not candidate for LP due to [place] and midline shift.   Speech on board, changed diet to pured diet.  Recommend vancomycin and Rocephin to have CNS coverage as well as aspiration pneumonia.  If not improving, will consider MRI with and without contrast to rule out ventriculitis which less likely at this time.  Follow-up with blood culture.  We will follow.  For detailed assessment and plan, please refer to above/below as I have made changes wherever appropriate.   Rosalin Hawking, MD PhD Stroke Neurology 04/12/2022 5:48 PM  I discussed with Dr. Louanne Belton. I spent extensive face-to-face time with the patient, more than 50% of which was spent in counseling and coordination of care, reviewing test results, images and medication, and discussing the diagnosis, treatment plan and potential prognosis. This patient's care requiresreview of multiple databases, neurological assessment, discussion with family, other specialists and medical decision making of high complexity.      To contact Stroke Continuity provider, please refer to [email or web address] After hours, contact General Neurology

## 2022-04-12 NOTE — Procedures (Signed)
Patient Name: [name] Giles  MRN: [id number]  Epilepsy Attending: Lora Havens  Referring Physician/Provider: Rosalin Hawking, MD  Date: 04/12/2022 Duration: 23.50 mins  Patient history: 66 year old with left temporal hemorrhage and altered mental status.  EEG to evaluate for seizure.  Level of alertness: lethargic, asleep  AEDs during EEG study: None  Technical aspects: This EEG study was done with scalp electrodes positioned according to the 10-20 International system of electrode placement. Electrical activity was reviewed with band pass filter of 1-'70Hz'$ , sensitivity of 7 uV/mm, display speed of 54m/sec with a '60Hz'$  notched filter applied as appropriate. EEG data were recorded continuously and digitally stored.  Video monitoring was available and reviewed as appropriate.  Description: During awake state, no clear posterior dominant rhythm was seen.  EEG showed continuous generalized predominantly 5 to 7 Hz theta slowing admixed with intermittent generalized high amplitude polymorphic sharply contoured 3 to 6 Hz theta-delta slowing.  Sleep was characterized by sleep spindles (12 to 40 Hz), maximal frontocentral region. Hyperventilation and photic stimulation were not performed.     ABNORMALITY - Continuous slow, generalized  IMPRESSION: This study is suggestive of moderate diffuse encephalopathy, nonspecific etiology. No seizures or epileptiform discharges were seen throughout the recording.  If suspicion for interictal activity remains a concern, a prolonged study can be considered.   [name] OBarbra Sarks

## 2022-04-12 NOTE — Progress Notes (Signed)
EEG complete - results pending 

## 2022-04-13 DIAGNOSIS — R509 Fever, unspecified: Secondary | ICD-10-CM | POA: Diagnosis not present

## 2022-04-13 DIAGNOSIS — R410 Disorientation, unspecified: Secondary | ICD-10-CM | POA: Diagnosis not present

## 2022-04-13 DIAGNOSIS — I611 Nontraumatic intracerebral hemorrhage in hemisphere, cortical: Secondary | ICD-10-CM | POA: Diagnosis not present

## 2022-04-13 DIAGNOSIS — E785 Hyperlipidemia, unspecified: Secondary | ICD-10-CM | POA: Diagnosis not present

## 2022-04-13 DIAGNOSIS — I10 Essential (primary) hypertension: Secondary | ICD-10-CM | POA: Diagnosis not present

## 2022-04-13 DIAGNOSIS — Q282 Arteriovenous malformation of cerebral vessels: Secondary | ICD-10-CM

## 2022-04-13 LAB — COMPREHENSIVE METABOLIC PANEL
ALT: 67 U/L — ABNORMAL HIGH (ref 0–44)
AST: 34 U/L (ref 15–41)
Albumin: 2.9 g/dL — ABNORMAL LOW (ref 3.5–5.0)
Alkaline Phosphatase: 63 U/L (ref 38–126)
Anion gap: 9 (ref 5–15)
BUN: 21 mg/dL (ref 8–23)
CO2: 22 mmol/L (ref 22–32)
Calcium: 8.7 mg/dL — ABNORMAL LOW (ref 8.9–10.3)
Chloride: 106 mmol/L (ref 98–111)
Creatinine, Ser: 0.69 mg/dL (ref 0.44–1.00)
GFR, Estimated: >60 mL/min (ref >60)
Glucose, Bld: 109 mg/dL — ABNORMAL HIGH (ref 70–99)
Potassium: 4.2 mmol/L (ref 3.5–5.1)
Sodium: 137 mmol/L (ref 135–145)
Total Bilirubin: 0.7 mg/dL (ref 0.3–1.2)
Total Protein: 6.5 g/dL (ref 6.5–8.1)

## 2022-04-13 LAB — MAGNESIUM: Magnesium: 2.2 mg/dL (ref 1.7–2.4)

## 2022-04-13 LAB — CBC
HCT: 39.1 % (ref 36.0–46.0)
Hemoglobin: 13.2 g/dL (ref 12.0–15.0)
MCH: 30.3 pg (ref 26.0–34.0)
MCHC: 33.8 g/dL (ref 30.0–36.0)
MCV: 89.7 fL (ref 80.0–100.0)
Platelets: 334 10*3/uL (ref 150–400)
RBC: 4.36 MIL/uL (ref 3.87–5.11)
RDW: 13.5 % (ref 11.5–15.5)
WBC: 15.7 10*3/uL — ABNORMAL HIGH (ref 4.0–10.5)
nRBC: 0 % (ref 0.0–0.2)

## 2022-04-13 LAB — GLUCOSE, CAPILLARY
Glucose-Capillary: 100 mg/dL — ABNORMAL HIGH (ref 70–99)
Glucose-Capillary: 104 mg/dL — ABNORMAL HIGH (ref 70–99)
Glucose-Capillary: 115 mg/dL — ABNORMAL HIGH (ref 70–99)
Glucose-Capillary: 130 mg/dL — ABNORMAL HIGH (ref 70–99)

## 2022-04-13 MED ORDER — ACETAMINOPHEN 160 MG/5ML PO SOLN: 650.0000 mg | ORAL | Status: DC | PRN

## 2022-04-13 MED ORDER — ACETAMINOPHEN 325 MG PO TABS
650.0000 mg | ORAL_TABLET | ORAL | Status: DC | PRN
  Administered 2022-04-13 – 2022-04-16 (×5): 650 mg via ORAL
  Filled 2022-04-13 (×4): qty 2

## 2022-04-13 MED ORDER — ACETAMINOPHEN 650 MG RE SUPP: 650.0000 mg | RECTAL | Status: DC | PRN

## 2022-04-13 NOTE — Progress Notes (Addendum)
STROKE TEAM PROGRESS NOTE   INTERVAL HISTORY Afebrile overnight (Tmax100s). BP systolics 099I. HR 110s. WBC 18.6 -> 15.7. Bcx pending, preliminary no growth. SLP dysphagia diet. On vancomycin, ceftriaxone. EEG no seizures or epileptiform discharges.   Patient alert, asks "how are you doing" on room entry. Still with global aphasia, word salad improved compared to yesterday but worse than prior. Decreased coughing during interview today.    Vitals:   04/12/22 1928 04/12/22 2056 04/12/22 2321 04/13/22 0514  BP:  (!) 120/93 134/86 (!) 131/53  Pulse:  (!) 113 (!) 118 92  Resp:  '18 19 17  '$ Temp:  99.7 F (37.6 C) 100.2 F (37.9 C) 98.9 F (37.2 C)  TempSrc:  Oral Oral Oral  SpO2: 95% 99% 95% 100%  Weight:      Height:       CBC:  Recent Labs  Lab 04/12/22 0317 04/13/22 0416  WBC 18.6* 15.7*  HGB 13.3 13.2  HCT 38.7 39.1  MCV 88.2 89.7  PLT 344 338   Basic Metabolic Panel:  Recent Labs  Lab 04/12/22 0317 04/13/22 0416  NA 138 137  K 3.6 4.2  CL 103 106  CO2 24 22  GLUCOSE 132* 109*  BUN 18 21  CREATININE 0.75 0.69  CALCIUM 9.2 8.7*  MG  --  2.2   Lipid Panel:  No results for input(s): "CHOL", "TRIG", "HDL", "CHOLHDL", "VLDL", "LDLCALC" in the last 168 hours.  HgbA1c:  No results for input(s): "HGBA1C" in the last 168 hours.  Urine Drug Screen:  No results for input(s): "LABOPIA", "COCAINSCRNUR", "LABBENZ", "AMPHETMU", "THCU", "LABBARB" in the last 168 hours.   Alcohol Level  No results for input(s): "ETH" in the last 168 hours.   IMAGING past 24 hours DG Swallowing Func-Speech Pathology  Result Date: 04/12/2022 Table formatting from the original result was not included. Objective Swallowing Evaluation: Type of Study: MBS-Modified Barium Swallow Study  Patient Details Name: KRISSY OREBAUGH MRN: [id number] Date of Birth: 1955-11-25 Today's Date: 04/12/2022 Time: SLP Start Time (ACUTE ONLY): 1455 -SLP Stop Time (ACUTE ONLY): 1515 SLP Time Calculation (min) (ACUTE  ONLY): 20 min Past Medical History: Past Medical History: Diagnosis Date  Asthma   Medical history non-contributory  Past Surgical History: Past Surgical History: Procedure Laterality Date  COLONOSCOPY    COLONOSCOPY N/A 04/06/2013  Procedure: COLONOSCOPY;  Surgeon: Daneil Dolin, MD;  Location: [facility];  Service: Endoscopy;  Laterality: N/A;  9:30 AM  ENDOMETRIAL ABLATION    IR ANGIO INTRA EXTRACRAN SEL INTERNAL CAROTID BILAT MOD SED  04/04/2022  IR US GUIDE VASC ACCESS RIGHT  04/04/2022 HPI: Pt is a 66 y/o female who presented with headache, N/V, and AMS. CTH revealed a large L frontotemporal ICH with IVH and 53m midline shift. Repeat head CT with increasing hemorrhage and associated hydrocephalus. Pt s/p ventriculostomy 8/10. MBS 8/18 WFL; regular texture diet and thin liquids recommended Repeat CT head 8/24 due to change in mentation: Expected evolution of left temporal lobe hemorrhage. Stable intraventricular hemorrhage, left greater than right, decreased mass effect with decreasing edema around the hemorrhage and decreasing midline shift. Urinalysis negative, CXR negative.PMH: COPD, tobacco abuse, asthma.  No data recorded  Recommendations for follow up therapy are one component of a multi-disciplinary discharge planning process, led by the attending physician.  Recommendations may be updated based on patient status, additional functional criteria and insurance authorization. Assessment / Plan / Recommendation   04/12/2022   2:55 PM Clinical Impressions Clinical Impression Pt has  demonstrated a decline in swallow function since 8/18. Pt exhibited oropharyngeal dysphagia characterized by reduced bolus awareness, impaired mastication, and a pharyngeal delay. She demonstrated intermittent oral holding, and prolonged mastication which necessitated cues for initiation and swallowing. The swallow was often triggered with solid boluses at the level of the valleculae or pyriforms and cues were intermittently  necessary for pt to swallow solids. Trace aspiration (PAS 7) was noted once with consecutive swallows of thin liquids via straw secondary to the pharyngeal delay and pt's independent coughing expelled the aspirate. However, aspiration could not be replicated despite provision of additional boluses of thin liquids via straw. Pt consistently spat out the pill when it was given in puree and with thin liquids. Coughing continues to be noted in the absence of aspiration as well so SLP suspects that all coughing at bedside should not be attributed to aspiration especially if pt's mentation is at baseline. Considering pt's now impaired bolus awareness and mastication, a dysphagia 1 diet with thin liquids will be initiated with strict observance of swallowing precautions. SLP will continue to follow pt. SLP Visit Diagnosis Dysphagia, oropharyngeal phase (R13.12) Impact on safety and function Mild aspiration risk     04/12/2022   2:55 PM Treatment Recommendations Treatment Recommendations Therapy as outlined in treatment plan below     04/12/2022   2:55 PM Prognosis Prognosis for Safe Diet Advancement Good Barriers to Reach Goals Language deficits;Cognitive deficits   04/12/2022   2:55 PM Diet Recommendations SLP Diet Recommendations Thin liquid;Dysphagia 1 (Puree) solids Liquid Administration via Cup;No straw Medication Administration Crushed with puree Compensations Minimize environmental distractions;Slow rate;Small sips/bites;Follow solids with liquid Postural Changes Remain semi-upright after after feeds/meals (Comment);Seated upright at 90 degrees     04/12/2022   2:55 PM Other Recommendations Oral Care Recommendations Oral care BID Follow Up Recommendations Acute inpatient rehab (3hours/day) Assistance recommended at discharge Frequent or constant Supervision/Assistance Functional Status Assessment Patient has had a recent decline in their functional status and demonstrates the ability to make significant improvements in  function in a reasonable and predictable amount of time.   04/12/2022   2:55 PM Frequency and Duration  Speech Therapy Frequency (ACUTE ONLY) min 2x/week Treatment Duration 2 weeks     04/12/2022   2:55 PM Oral Phase Oral Phase Impaired Oral - Nectar Cup Delayed oral transit;Holding of bolus Oral - Nectar Straw Delayed oral transit;Holding of bolus Oral - Thin Cup Delayed oral transit;Holding of bolus Oral - Thin Straw Delayed oral transit;Holding of bolus Oral - Puree Delayed oral transit;Holding of bolus Oral - Regular Delayed oral transit;Holding of bolus;Impaired mastication Oral - Pill Delayed oral transit;Holding of bolus    04/12/2022   2:55 PM Pharyngeal Phase Pharyngeal Phase Impaired Pharyngeal- Nectar Cup Delayed swallow initiation-pyriform sinuses;Delayed swallow initiation-vallecula Pharyngeal- Nectar Straw Delayed swallow initiation-pyriform sinuses;Delayed swallow initiation-vallecula Pharyngeal- Thin Cup Delayed swallow initiation-pyriform sinuses;Delayed swallow initiation-vallecula Pharyngeal- Thin Straw Delayed swallow initiation-pyriform sinuses;Delayed swallow initiation-vallecula Pharyngeal- Puree Delayed swallow initiation-vallecula Pharyngeal- Regular Delayed swallow initiation-vallecula Pharyngeal- Pill NT    04/12/2022   2:55 PM Cervical Esophageal Phase  Cervical Esophageal Phase [facility] Shanika I. Hardin Negus, [place], [place] Office number [phone] Horton Marshall 04/12/2022, 3:56 PM                     CT HEAD WO CONTRAST (5MM)  Result Date: 04/12/2022 CLINICAL DATA:  Intraventricular hemorrhage and drain. EXAM: CT HEAD WITHOUT CONTRAST TECHNIQUE: Contiguous axial images were obtained from the base of  the skull through the vertex without intravenous contrast. RADIATION DOSE REDUCTION: This exam was performed according to the departmental dose-optimization program which includes automated exposure control, adjustment of the mA and/or kV according to patient size  and/or use of iterative reconstruction technique. COMPARISON:  CT head without contrast 04/06/2022 and 03/31/2022. FINDINGS: Brain: The right-sided ventricular catheter scratched at the right-sided ventriculostomy catheter has been removed. Expected evolution of a left temporal lobe hemorrhage noted. The borders are now less distinct. Surrounding edema is slightly less prominent. Mass effect remains with partial effacement of the left lateral ventricle and midline shift. Midline shift now measures 7.5 mm. Stable intraventricular hemorrhage is present, left greater than right. The right frontal ventriculostomy catheter has been removed. Minimal residual edema is noted along the tract. No new hemorrhage or progressive mass effect is present. 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 No acute cortical abnormality is present.  Basal ganglia are intact. The brainstem and cerebellum are within normal limits. Vascular: Atherosclerotic calcifications are present within the cavernous internal carotid arteries bilaterally. No hyperdense vessel is present. Skull: Right frontal burr hole noted. Calvarium is otherwise intact. No focal lytic or blastic lesions are present. Skin staples are present in the right frontal scalp with a ventriculostomy catheter was. Sinuses/Orbits: The paranasal sinuses and mastoid air cells are clear. The globes and orbits are within normal limits. IMPRESSION: 1. Expected evolution of left temporal lobe hemorrhage. 2. Stable intraventricular hemorrhage, left greater than right. 3. Removal of right frontal ventriculostomy catheter. 4. 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 5. Decreased mass effect with decreasing edema around the hemorrhage and decreasing midline shift. No new hemorrhage. Electronically Signed   By: San Morelle M.D.   On: 04/12/2022 14:06   DG CHEST PORT 1  VIEW  Result Date: 04/12/2022 CLINICAL DATA:  Fever EXAM: PORTABLE CHEST 1 VIEW COMPARISON:  Previous studies including the examination of 04/07/2022 FINDINGS: Cardiac size is within normal limits. Lung fields are clear of any pulmonary edema or new focal infiltrates. Small linear densities in left lower lung field may suggest minimal subsegmental atelectasis. There is interval removal of enteric tube. There is no pleural effusion or pneumothorax. IMPRESSION: There are no signs of pulmonary edema or focal pulmonary consolidation. Electronically Signed   By: Elmer Picker M.D.   On: 04/12/2022 08:21    PHYSICAL EXAM GEN- Alert, awake, well-appearing Respiratory- Normal work of breathing with decreased coughing.  Neuro: She is alert, unable to follow commands. Able to intermittently repeat. Asks "how are you doing" and able to say "I'm doing good." Global aphasia with word salad. Lateral gaze intact. PERRL, face symmetric at rest. Lifts BUE and BLE against gravity. RUE 4/5 strength compared to LUE.     ASSESSMENT/PLAN 66 year old female with history of COPD, smoker admitted for headache, nausea, vomiting, found down in bathroom with decreased level consciousness.  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  CT repeat showed mild increase in size. NSGY consulted. Dr. Arnoldo Morale on board. Status post EVD. MRI with and without contrast showed 68 cc ICH, with IVH and midline shift 43m.  Repeat CT showed stable hematoma but midline shift up to 1 cm.  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  CTV no this thrombosis.  EF 60 to 65%, LDL 119, A1c  5.7, UDS negative. Re-consulted due to patient with fever, worsening leukocytosis, tachycardia and encephalopathy.  Patient does have frequent cough, concerning for pneumonia, bronchitis or aspiration, however CXR so far unremarkable.  UA negative, blood culture pending,  preliminary growth negative.  CT repeat stable hematoma and midline shift. The mild to moderate nuchal rigidity likely due to IVH, less likely due to CNS infection.  Patient not candidate for LP due to [place] and midline shift. Patient improved with antibiotics.   Encephalopathy with fever and cough, resolving  Afebrile overnight Leukocytosis WBC 11.5->12.4->18.6 -> 15.7 Suspect aspiration PNA 2/2 increased coughing. CXR unremarkable  CTH unremarkable.  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  Impending left uncal herniation.  Midline shift 5 mm Repeat HCT 2 hours after Interval increase in size of Left IPH. New IPH within the left uncus/medial temporal lobe.  Last CT repeat 8/18 shows stable hematoma with midline shift 1 cm MRI  03/29/22 - 68 mL intraparenchymal hemorrhage centered at the left temporal lobe, similar in size as compared to prior CT. midline shift 7 mm CT repeat 03/30/22 stable hematoma but increased midline shift to 1 cm CTA Head and Neck Cluster of abnormal, tortuous vessels along the lateral aspect of the left temporal lobe compatible with an AVF for AVM.  CT venogram negative for venous thrombosis Repeat HCT 8/12 showed Stable hematoma and ventricle size, stable intracranial mass effect with up to 9 mm MLS.  CT head 8/18: stable hematoma and EVD.  Blood has cleared the 3rd and 4th ventricles since 03/31/2022. CT head 8/24: expected evolution of left temporal lobe hemorrhage, stable intraventricular hemorrhage, decreased mass effect with decreasing edema around the hemorrhage and decreasing midline shift 2D Echo EF 60-65%, No  shunt or thrombus detected LDL 119 HgbA1c 5.7 VTE prophylaxis - Heparin subcu No antiplatelet or anticoagulants prior to admission per chart review, now on no antithrombotics due to [place] 2/2 AVF Therapy recommendations: CIR.  Disposition:  TBD  Cerebral Edema Obstructive hydrocephalus 8/12: stopped 3% HTS Na 148 this AM EVD placed -> 8/18 Ventriculostomy removed.  CT 8/24 stable midline shift  Left temporal AVF/ AVM Neurosurgery following CTA more clearly showing this as likely source of hemorrhage Per NSGY, angiogram attempted 8/16 however was aborted and was nondiagnostic and limited by patient movement.  Repeat angiogram initially planned before discharge from CIR per Dr. Kathyrn Sheriff. However, patient now most likely need to go to SNF. Discussed with Dr. Kathyrn Sheriff, he will see pt 3-4 weeks after discharge.   HTN No hx of hypertension or home BP meds prior to admission Stable now Stopped norvasc '10mg'$  in the setting of hypotensive events overnight 8/19.  8/21: restarted amlodipine 2.'5mg'$    Systolic BP <852  Long term BP goal normotensive.  Hyperlipidemia Home meds: None  LDL 119, goal < 70 On Crestor 20 Continue statin on discharge  Dysphagia Core track was removed 8/18 Passed MBS, recommended regular solid, thin liquid diet. 8/24 SLP to re-evaluate patient due to concern for aspiration PNA. Recommending thin liquid but puree diet  Urinary retention Foley catheter in place, high risk for urinary retention due to AMS  Had bladder scan with 131m overnight, given LR 5078mbolus On bethanechol '10mg'$  TID   Tobacco abuse Current smoker 1PPD, 30 pack year hx Smoking cessation counseling will be provided  Other Stroke Risk Factors Advanced Age >/= 6583Current ETOH use, alcohol level <10, will be advised to drink no more than 1 drink a day Incidentally noted previous cerebellar stroke   Hypokalemia Initially repleting with IV K, however changed to PO due to patient pain   Repleting with Kclor x2.   Other Active Problems COPD/asthma  Hospital day # 15  StRolanda LundborgMD, PGY-1   04/13/2022 7:24 AM  ATTENDING NOTE: I reviewed above note and agree with the assessment and plan. Pt was seen and examined.   Patient son at bedside.  Patient more awake alert than yesterday, greeting providers on arrival, still has global aphasia but able to has intermittent meaningful sentences.  Moving all extremities, patient condition much improved from yesterday, still has low-grade fever but leukocytosis improved.  Continue antibiotics per primary team.  Continue Crestor, patient will follow-up with Dr. NuKathyrn Sheriffor AVM management.  For detailed assessment and plan, please refer to above/below as I have made changes wherever appropriate.   Neurology will sign off. Please call with questions. [facility] Thanks for the consult.   JiRosalin HawkingMD PhD Stroke Neurology 04/13/2022 7:37 PM    To contact Stroke Continuity provider, please refer to [email or web address], contact General Neurology

## 2022-04-13 NOTE — Progress Notes (Signed)
Physical Therapy Treatment Patient Details Name: [name] Giles MRN: [id number] DOB: 04-25-1956 Today's Date: 04/13/2022   History of Present Illness [name] Giles is a 66 y.o. female presenting 49/10 after being found on the bathroom at work, head CT revealed a large left frontotemporal ICH with IVH and 5 mm midline shift and ballooning of the temporal horns concerning for obstructive hydrocephalus s/p placement of right frontal ventriculostomy via bur hole 8/10. EVD removed 8/18. PHMx: COPD, tobacco abuse and asthma.    PT Comments    Patient progressing slowly towards PT goals. Pt lethargic initially but able to wake up once stimulated and sitting EOB. Continues to have expressive deficits with incoherent speech except for a few phrases. Requires Min A for transfers and  Min-Mod A for gait training with use of RW for support. Difficulty with managing RW positioning and balance. Able to follow simple commands with increased time, repetition and multi modal cues. Seems to be improving in her cognition per nursing and family. Will continue to follow.   Recommendations for follow up therapy are one component of a multi-disciplinary discharge planning process, led by the attending physician.  Recommendations may be updated based on patient status, additional functional criteria and insurance authorization.  Follow Up Recommendations  Acute inpatient rehab (3hours/day)     Assistance Recommended at Discharge Frequent or constant Supervision/Assistance  Patient can return home with the following A lot of help with bathing/dressing/bathroom;Assistance with feeding;Assistance with cooking/housework;Direct supervision/assist for medications management;Direct supervision/assist for financial management;Assist for transportation;Help with stairs or ramp for entrance;A lot of help with walking and/or transfers   Equipment Recommendations  Other (comment) (defer to next venue)    Recommendations for  Other Services       Precautions / Restrictions Precautions Precautions: Fall Restrictions Weight Bearing Restrictions: No     Mobility  Bed Mobility Overal bed mobility: Needs Assistance Bed Mobility: Supine to Sit     Supine to sit: Min assist, HOB elevated     General bed mobility comments: ASsist with trunk to get to EOB, increased time and cueing.    Transfers Overall transfer level: Needs assistance Equipment used: Rolling walker (2 wheels) Transfers: Sit to/from Stand Sit to Stand: Min assist           General transfer comment: Min A to power to standing, despite cues for hand placement, pt pulling up on RW. Stood from [employer], from chair x1. Transferred to chair post ambulation.    Ambulation/Gait Ambulation/Gait assistance: Mod assist, Min assist Gait Distance (Feet): 80 Feet (x2 bouts) Assistive device: Rolling walker (2 wheels) Gait Pattern/deviations: Decreased stride length, Narrow base of support, Step-through pattern, Trunk flexed Gait velocity: decreased Gait velocity interpretation: <1.31 ft/sec, indicative of household ambulator   General Gait Details: Slow, unsteady gait with forward flexed posture with cues for RW proximity/management as RW too far anterior, bil knee instability when fatigued. 1 seated rest break.   Stairs             Wheelchair Mobility    Modified Rankin (Stroke Patients Only) Modified Rankin (Stroke Patients Only) Pre-Morbid Rankin Score: No symptoms Modified Rankin: Moderately severe disability     Balance Overall balance assessment: Needs assistance Sitting-balance support: Feet supported, No upper extremity supported Sitting balance-Leahy Scale: Fair     Standing balance support: During functional activity, Reliant on assistive device for balance Standing balance-Leahy Scale: Poor Standing balance comment: reliant on UE support  Cognition Arousal/Alertness:  Lethargic, Awake/alert Behavior During Therapy: Flat affect Overall Cognitive Status: Difficult to assess Area of Impairment: Following commands, Safety/judgement                       Following Commands: Follows one step commands inconsistently, Follows one step commands with increased time Safety/Judgement: Decreased awareness of safety, Decreased awareness of deficits   Problem Solving: Slow processing, Decreased initiation, Difficulty sequencing, Requires verbal cues, Requires tactile cues General Comments: Lethargic initially but once sitting EOB and moving, became more awake. follows simple commands with repetition and increased time, "what do you want me to do?" otherwise speech mostly incoherent        Exercises      General Comments General comments (skin integrity, edema, etc.): Sister present during session.      Pertinent Vitals/Pain Pain Assessment Pain Assessment: Faces Faces Pain Scale: No hurt    Home Living                          Prior Function            PT Goals (current goals can now be found in the care plan section) Acute Rehab PT Goals Patient Stated Goal: none stated PT Goal Formulation: Patient unable to participate in goal setting Time For Goal Achievement: 04/27/22 Potential to Achieve Goals: Good Progress towards PT goals: Progressing toward goals    Frequency    Min 4X/week      PT Plan Current plan remains appropriate    Co-evaluation              AM-PAC PT "6 Clicks" Mobility   Outcome Measure  Help needed turning from your back to your side while in a flat bed without using bedrails?: A Little Help needed moving from lying on your back to sitting on the side of a flat bed without using bedrails?: A Little Help needed moving to and from a bed to a chair (including a wheelchair)?: A Little Help needed standing up from a chair using your arms (e.g., wheelchair or bedside chair)?: A Little Help needed  to walk in hospital room?: A Lot Help needed climbing 3-5 steps with a railing? : Total 6 Click Score: 15    End of Session Equipment Utilized During Treatment: Gait belt Activity Tolerance: Patient tolerated treatment well Patient left: in chair;with call bell/phone within reach;with chair alarm set;with family/visitor present Nurse Communication: Mobility status;Other (comment) (purewick) PT Visit Diagnosis: Unsteadiness on feet (R26.81);Other abnormalities of gait and mobility (R26.89);Repeated falls (R29.6);Muscle weakness (generalized) (M62.81)     Time: 9563-8756 PT Time Calculation (min) (ACUTE ONLY): 18 min  Charges:  $Gait Training: 8-22 mins                     Marisa Severin, PT, DPT Acute Rehabilitation Services Secure chat preferred Office [place] 04/13/2022, 1:05 PM

## 2022-04-13 NOTE — Progress Notes (Signed)
IP rehab admissions - per message from attending MD, patient is not medically ready for inpatient rehab at this time.  Will have my partner follow up.  Call for questions.  [phone]

## 2022-04-13 NOTE — Plan of Care (Signed)
  Problem: Education: Goal: Knowledge of disease or condition will improve Outcome: Progressing Goal: Knowledge of secondary prevention will improve (SELECT ALL) Outcome: Progressing Goal: Knowledge of patient specific risk factors will improve (INDIVIDUALIZE FOR PATIENT) Outcome: Progressing Goal: Individualized Educational Video(s) Outcome: Progressing   Problem: Coping: Goal: Will verbalize positive feelings about self Outcome: Progressing Goal: Will identify appropriate support needs Outcome: Progressing   Problem: Health Behavior/Discharge Planning: Goal: Ability to manage health-related needs will improve Outcome: Progressing   Problem: Self-Care: Goal: Ability to participate in self-care as condition permits will improve Outcome: Progressing Goal: Verbalization of feelings and concerns over difficulty with self-care will improve Outcome: Progressing Goal: Ability to communicate needs accurately will improve Outcome: Progressing   Problem: Nutrition: Goal: Risk of aspiration will decrease Outcome: Progressing Goal: Dietary intake will improve Outcome: Progressing

## 2022-04-13 NOTE — Progress Notes (Signed)
   04/13/22 1556  Assess: MEWS Score  Temp (!) 100.7 F (38.2 C)  BP 115/79  MAP (mmHg) 87  Pulse Rate (!) 109  Resp 18  Level of Consciousness Alert  SpO2 98 %  O2 Device Room Air  Assess: MEWS Score  MEWS Temp 1  MEWS Systolic 0  MEWS Pulse 1  MEWS RR 0  MEWS LOC 0  MEWS Score 2  MEWS Score Color Yellow  Assess: if the MEWS score is Yellow or Red  Were vital signs taken at a resting state? Yes  Focused Assessment No change from prior assessment  Does the patient meet 2 or more of the SIRS criteria? Yes  Does the patient have a confirmed or suspected source of infection? Yes  Provider and Rapid Response Notified? Yes (Provider notified.)  MEWS guidelines implemented *See Row Information* Yes  Treat  MEWS Interventions Administered prn meds/treatments  Take Vital Signs  Increase Vital Sign Frequency  Yellow: Q 2hr X 2 then Q 4hr X 2, if remains yellow, continue Q 4hrs  Escalate  MEWS: Escalate Yellow: discuss with charge nurse/RN and consider discussing with provider and RRT  Notify: Charge Nurse/RN  Name of Charge Nurse/RN Notified Levada Dy  Date Charge Nurse/RN Notified 04/13/22  Time Charge Nurse/RN Notified 1602  Notify: Provider  Provider Name/Title Dr Louanne Belton  Date Provider Notified 04/13/22  Time Provider Notified [phone]  Method of Notification Page  Notification Reason Other (Comment) (Yellow MEWS)  Assess: SIRS CRITERIA  SIRS Temperature  0  SIRS Pulse 1  SIRS Respirations  0  SIRS WBC 1  SIRS Score Sum  2

## 2022-04-13 NOTE — Progress Notes (Addendum)
PROGRESS NOTE    FAYOLA MECKES  XTK:[id number] DOB: 08-23-1955 DOA: 03/29/2022 PCP: Redmond School, MD   Brief Narrative:  JANELIS STELZER is a 66 y.o. female with past medical history of COPD, tobacco use and asthma presented to hospital after being found on the floor. 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 Patient was initially admitted to the ICU for management.  Patient underwent ventriculostomy secondary to hydrocephalus which has improved. PT/OT recommended acute rehabilitation. Patient did have delirium during hospitalization and was noted to have a fever.  Neurology was reconsulted due to history of intraventricular hemorrhage and drain placement in the brain.  Neurology recommended antibiotic for possible lung infection.  After initiation of antibiotic patient has improved.  Plan is CIR when medically stable.  Assessment and Plan: Principal Problem:   Intracerebral hemorrhage ([place]) Active Problems:   Delirium   Essential hypertension   Hyperlipidemia   Temporal intracerebral hemorrhage As per neurology likely secondary to dural  AVF.  Patient initially admitted to ICU under stroke service and was noted to have large left temporal and frontal intracranial hemorrhage with intraventricular extension and left uncal herniation and midline shift of 5 mm . MRI was then performed with similar findings in addition to increased midline shift to 7 mm. Repeat CT head (8/11) significant for increased midline shift to 10 mm. CTA head/neck (8/11) significant for AVF/AVM of left temporal lobe.  Patient underwent ventriculostomy during hospitalization.  CT head (8/12) was significant for stabilized lesions. LDL of 110, hemoglobin A1C of 5.7% and 2D Echocardiogram significant for LVEF of 60-65% with no atrial level shunt identified and no thrombus identified. PT/OT has recommended acute inpatient rehabilitation.  Delirium  There is concern for aspiration due to  decreased mental status and coughing but chest x-ray was negative.  Urinalysis negative.  blood cultures negative in 24 hours.   We will continue aspiration precautions.  Initially needed Precedex in the ICU delirium.  Bladder scan done without any significant urinary retention.   No evidence of suture line infection.  CT scan of the head with resolving intracerebral hemorrhage.  Neurology was consulted due to delirium and recommend antibiotics for possible lung infection.  Less likely to be CNS infection related as per neurology.   Temperature max of 100.2 F temperature this morning 98.8.  Has improved.  Leukocytosis has slightly trended down from 18.6-15.7.We will continue to monitor tonight and de-escalate antibiotic by tomorrow if continues to improve.   Cerebral edema/Obstructive hydrocephalus Neurosurgery was consulted, patient was initially put on 3% hypertonic saline from 8/10 /23 until 03/31/22. Right frontal ventriculostomy was placed on 03/29/22 by neurosurgery and was removed on 04/06/22.  Left temporal AVF/AVM Likely source of hemorrhage. Neurosurgery recommending angiogram which will need to be performed when medically stable and not confused.  Hypertension Previous history of hypertension.  Was initially on Cleviprex drip.  On low-dose amlodipine 2.5 mg daily at this time.  Hyperlipidemia LDL of 119.   on Crestor  Dysphagia on regular diet, needs assistance with feeding..  Tobacco use Not on nicotine replacement at this time.  Hypokalemia Improved with replacement.  Latest potassium of 4.2.  Hypernatremia Improved at this time.  Latest sodium of 137.  Prediabetes Hemoglobin A1C calculated to be 5.7%.  Closely monitor.  COPD Supposed to be on Symbicort and albuterol was not not taking. Currently no exacerbation. -Continue Pulmicort and Brovana, Duoneb PRN  DVT prophylaxis: Heparin subq  Code Status:   Code Status:  Full Code  Family Communication:  I spoke with the  patient's son on the phone at bedside and updated him about the clinical condition of the patient..  Disposition Plan:  Plan for for inpatient rehab likely in 1 to 2 days.  Mentation has slightly improved compared to yesterday.  Consultants:  Neurology/Stroke Neurosurgery PCCM  Procedures:  Ventriculostomy NG tube placement and removal  Antimicrobials: None  Subjective: Today, she was seen and examined at bedside.  Little more alert awake and following few commands.  Appears better than yesterday.  Denies pain, nausea or vomiting.  Objective: BP (!) 108/54 (BP Location: Left Leg)   Pulse 98   Temp 100.1 F (37.8 C) (Oral)   Resp 15   Ht '5\' 5"'$  (1.651 m)   Wt 66.2 kg   SpO2 95%   BMI 24.29 kg/m   Body mass index is 24.29 kg/m.   General:  Average built, not in obvious distress, appears weak and deconditioned. HENT:   No scleral pallor or icterus noted. Oral mucosa is moist.  Scalp with sutures in place. Chest:   Diminished breath sounds bilaterally.  CVS: S1 &S2 heard. No murmur.  Regular rate and rhythm. Abdomen: Soft, nontender, nondistended.  Bowel sounds are heard.   Extremities: No cyanosis, clubbing or edema.  Peripheral pulses are palpable. Psych: Alert, awake and alert Communicative, follows commands, speech more clear. CNS: Generalized weakness noted, speech more distinct, alert awake, following commands Skin: Warm and dry.  Scalp Sutures.  Data Reviewed:   I have personally reviewed the following labs and imaging studies.    CBC Lab Results  Component Value Date   WBC 15.7 (H) 04/13/2022   RBC 4.36 04/13/2022   HGB 13.2 04/13/2022   HCT 39.1 04/13/2022   MCV 89.7 04/13/2022   MCH 30.3 04/13/2022   PLT 334 04/13/2022   MCHC 33.8 04/13/2022   RDW 13.5 04/13/2022   LYMPHSABS 2.3 04/05/2022   MONOABS 1.2 (H) 04/05/2022   EOSABS 0.0 04/05/2022   BASOSABS 0.0 74/25/9563     Last metabolic panel Lab Results  Component Value Date   NA 137  04/13/2022   K 4.2 04/13/2022   CL 106 04/13/2022   CO2 22 04/13/2022   BUN 21 04/13/2022   CREATININE 0.69 04/13/2022   GLUCOSE 109 (H) 04/13/2022   GFRNONAA >60 04/13/2022   GFRAA >60 12/03/2019   CALCIUM 8.7 (L) 04/13/2022   PHOS 3.4 04/02/2022   PROT 6.5 04/13/2022   ALBUMIN 2.9 (L) 04/13/2022   BILITOT 0.7 04/13/2022   ALKPHOS 63 04/13/2022   AST 34 04/13/2022   ALT 67 (H) 04/13/2022   ANIONGAP 9 04/13/2022    GFR: Estimated Creatinine Clearance: 62.2 mL/min (by C-G formula based on SCr of 0.69 mg/dL).  Recent Results (from the past 240 hour(s))  Culture, blood (Routine X 2) w Reflex to ID Panel     Status: None (Preliminary result)   Collection Time: 04/12/22  8:49 AM   Specimen: BLOOD  Result Value Ref Range Status   Specimen Description BLOOD SITE NOT SPECIFIED  Final   Special Requests   Final    BOTTLES DRAWN AEROBIC AND ANAEROBIC Blood Culture adequate volume   Culture   Final    NO GROWTH < 24 HOURS Performed at [facility], [address] [place], [place] [address]    Report Status PENDING  Incomplete  Culture, blood (Routine X 2) w Reflex to ID Panel     Status: None (  Preliminary result)   Collection Time: 04/12/22  8:49 AM   Specimen: BLOOD  Result Value Ref Range Status   Specimen Description BLOOD SITE NOT SPECIFIED  Final   Special Requests   Final    BOTTLES DRAWN AEROBIC AND ANAEROBIC Blood Culture adequate volume   Culture   Final    NO GROWTH < 24 HOURS Performed at [facility], [address] [place], [place] [address]    Report Status PENDING  Incomplete      Radiology Studies: DG Swallowing Func-Speech Pathology  Result Date: 04/12/2022 Table formatting from the original result was not included. Objective Swallowing Evaluation: Type of Study: MBS-Modified Barium Swallow Study  Patient Details Name: ELVIE PALOMO MRN: [id number] Date of Birth: [birth year] Today's Date: 04/12/2022 Time: SLP Start Time (ACUTE ONLY): 1455 -SLP  Stop Time (ACUTE ONLY): 1515 SLP Time Calculation (min) (ACUTE ONLY): 20 min Past Medical History: Past Medical History: Diagnosis Date  Asthma   Medical history non-contributory  Past Surgical History: Past Surgical History: Procedure Laterality Date  COLONOSCOPY    COLONOSCOPY N/A 04/06/2013  Procedure: COLONOSCOPY;  Surgeon: Daneil Dolin, MD;  Location: [facility];  Service: Endoscopy;  Laterality: N/A;  9:30 AM  ENDOMETRIAL ABLATION    IR ANGIO INTRA EXTRACRAN SEL INTERNAL CAROTID BILAT MOD SED  04/04/2022  IR US GUIDE VASC ACCESS RIGHT  04/04/2022 HPI: Pt is a 66 y/o female who presented with headache, N/V, and AMS. CTH revealed a large L frontotemporal ICH with IVH and 22m midline shift. Repeat head CT with increasing hemorrhage and associated hydrocephalus. Pt s/p ventriculostomy 8/10. MBS 8/18 WFL; regular texture diet and thin liquids recommended Repeat CT head 8/24 due to change in mentation: Expected evolution of left temporal lobe hemorrhage. Stable intraventricular hemorrhage, left greater than right, decreased mass effect with decreasing edema around the hemorrhage and decreasing midline shift. Urinalysis negative, CXR negative.PMH: COPD, tobacco abuse, asthma.  No data recorded  Recommendations for follow up therapy are one component of a multi-disciplinary discharge planning process, led by the attending physician.  Recommendations may be updated based on patient status, additional functional criteria and insurance authorization. Assessment / Plan / Recommendation   04/12/2022   2:55 PM Clinical Impressions Clinical Impression Pt has demonstrated a decline in swallow function since 8/18. Pt exhibited oropharyngeal dysphagia characterized by reduced bolus awareness, impaired mastication, and a pharyngeal delay. She demonstrated intermittent oral holding, and prolonged mastication which necessitated cues for initiation and swallowing. The swallow was often triggered with solid boluses at the level of  the valleculae or pyriforms and cues were intermittently necessary for pt to swallow solids. Trace aspiration (PAS 7) was noted once with consecutive swallows of thin liquids via straw secondary to the pharyngeal delay and pt's independent coughing expelled the aspirate. However, aspiration could not be replicated despite provision of additional boluses of thin liquids via straw. Pt consistently spat out the pill when it was given in puree and with thin liquids. Coughing continues to be noted in the absence of aspiration as well so SLP suspects that all coughing at bedside should not be attributed to aspiration especially if pt's mentation is at baseline. Considering pt's now impaired bolus awareness and mastication, a dysphagia 1 diet with thin liquids will be initiated with strict observance of swallowing precautions. SLP will continue to follow pt. SLP Visit Diagnosis Dysphagia, oropharyngeal phase (R13.12) Impact on safety and function Mild aspiration risk     04/12/2022   2:55  PM Treatment Recommendations Treatment Recommendations Therapy as outlined in treatment plan below     04/12/2022   2:55 PM Prognosis Prognosis for Safe Diet Advancement Good Barriers to Reach Goals Language deficits;Cognitive deficits   04/12/2022   2:55 PM Diet Recommendations SLP Diet Recommendations Thin liquid;Dysphagia 1 (Puree) solids Liquid Administration via Cup;No straw Medication Administration Crushed with puree Compensations Minimize environmental distractions;Slow rate;Small sips/bites;Follow solids with liquid Postural Changes Remain semi-upright after after feeds/meals (Comment);Seated upright at 90 degrees     04/12/2022   2:55 PM Other Recommendations Oral Care Recommendations Oral care BID Follow Up Recommendations Acute inpatient rehab (3hours/day) Assistance recommended at discharge Frequent or constant Supervision/Assistance Functional Status Assessment Patient has had a recent decline in their functional status and  demonstrates the ability to make significant improvements in function in a reasonable and predictable amount of time.   04/12/2022   2:55 PM Frequency and Duration  Speech Therapy Frequency (ACUTE ONLY) min 2x/week Treatment Duration 2 weeks     04/12/2022   2:55 PM Oral Phase Oral Phase Impaired Oral - Nectar Cup Delayed oral transit;Holding of bolus Oral - Nectar Straw Delayed oral transit;Holding of bolus Oral - Thin Cup Delayed oral transit;Holding of bolus Oral - Thin Straw Delayed oral transit;Holding of bolus Oral - Puree Delayed oral transit;Holding of bolus Oral - Regular Delayed oral transit;Holding of bolus;Impaired mastication Oral - Pill Delayed oral transit;Holding of bolus    04/12/2022   2:55 PM Pharyngeal Phase Pharyngeal Phase Impaired Pharyngeal- Nectar Cup Delayed swallow initiation-pyriform sinuses;Delayed swallow initiation-vallecula Pharyngeal- Nectar Straw Delayed swallow initiation-pyriform sinuses;Delayed swallow initiation-vallecula Pharyngeal- Thin Cup Delayed swallow initiation-pyriform sinuses;Delayed swallow initiation-vallecula Pharyngeal- Thin Straw Delayed swallow initiation-pyriform sinuses;Delayed swallow initiation-vallecula Pharyngeal- Puree Delayed swallow initiation-vallecula Pharyngeal- Regular Delayed swallow initiation-vallecula Pharyngeal- Pill NT    04/12/2022   2:55 PM Cervical Esophageal Phase  Cervical Esophageal Phase [facility] Shanika I. Hardin Negus, [place], [place] Office number [phone] Horton Marshall 04/12/2022, 3:56 PM                     CT HEAD WO CONTRAST (5MM)  Result Date: 04/12/2022 CLINICAL DATA:  Intraventricular hemorrhage and drain. EXAM: CT HEAD WITHOUT CONTRAST TECHNIQUE: Contiguous axial images were obtained from the base of the skull through the vertex without intravenous contrast. RADIATION DOSE REDUCTION: This exam was performed according to the departmental dose-optimization program which includes automated exposure  control, adjustment of the mA and/or kV according to patient size and/or use of iterative reconstruction technique. COMPARISON:  CT head without contrast 04/06/2022 and 03/31/2022. FINDINGS: Brain: The right-sided ventricular catheter scratched at the right-sided ventriculostomy catheter has been removed. Expected evolution of a left temporal lobe hemorrhage noted. The borders are now less distinct. Surrounding edema is slightly less prominent. Mass effect remains with partial effacement of the left lateral ventricle and midline shift. Midline shift now measures 7.5 mm. Stable intraventricular hemorrhage is present, left greater than right. The right frontal ventriculostomy catheter has been removed. Minimal residual edema is noted along the tract. No new hemorrhage or progressive mass effect is present. 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 No acute cortical abnormality is present.  Basal ganglia are intact. The brainstem and cerebellum are within normal limits. Vascular: Atherosclerotic calcifications are present within the cavernous internal carotid arteries bilaterally. No hyperdense vessel is present. Skull: Right frontal burr hole noted. Calvarium is otherwise intact. No focal lytic or blastic lesions are  present. Skin staples are present in the right frontal scalp with a ventriculostomy catheter was. Sinuses/Orbits: The paranasal sinuses and mastoid air cells are clear. The globes and orbits are within normal limits. IMPRESSION: 1. Expected evolution of left temporal lobe hemorrhage. 2. Stable intraventricular hemorrhage, left greater than right. 3. Removal of right frontal ventriculostomy catheter. 4. 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 5. Decreased mass effect with decreasing edema around the hemorrhage and decreasing midline shift. No new hemorrhage. Electronically Signed   By: San Morelle M.D.   On: 04/12/2022 14:06   EEG adult  Result Date: 04/12/2022 Lora Havens, MD     04/13/2022  8:18 AM Patient Name: CATILYN BOGGUS MRN: [id number] Epilepsy Attending: Lora Havens Referring Physician/Provider: Rosalin Hawking, MD Date: 04/12/2022 Duration: 23.50 mins Patient history: 66 year old with left temporal hemorrhage and altered mental status.  EEG to evaluate for seizure. Level of alertness: lethargic, asleep AEDs during EEG study: None Technical aspects: This EEG study was done with scalp electrodes positioned according to the 10-20 International system of electrode placement. Electrical activity was reviewed with band pass filter of 1-'70Hz'$ , sensitivity of 7 uV/mm, display speed of 18m/sec with a '60Hz'$  notched filter applied as appropriate. EEG data were recorded continuously and digitally stored.  Video monitoring was available and reviewed as appropriate. Description: During awake state, no clear posterior dominant rhythm was seen.  EEG showed continuous generalized predominantly 5 to 7 Hz theta slowing admixed with intermittent generalized high amplitude polymorphic sharply contoured 3 to 6 Hz theta-delta slowing.  Sleep was characterized by sleep spindles (12 to 40 Hz), maximal frontocentral region. Hyperventilation and photic stimulation were not performed.   ABNORMALITY - Continuous slow, generalized IMPRESSION: This study is suggestive of moderate diffuse encephalopathy, nonspecific etiology. No seizures or epileptiform discharges were seen throughout the recording. If suspicion for interictal activity remains a concern, a prolonged study can be considered. Priyanka OBarbra Sarks  DG CHEST PORT 1 VIEW  Result Date: 04/12/2022 CLINICAL DATA:  Fever EXAM: PORTABLE CHEST 1 VIEW COMPARISON:  Previous studies including the examination of 04/07/2022 FINDINGS: Cardiac size is within normal limits. Lung fields are clear of any pulmonary edema or new focal infiltrates. Small linear  densities in left lower lung field may suggest minimal subsegmental atelectasis. There is interval removal of enteric tube. There is no pleural effusion or pneumothorax. IMPRESSION: There are no signs of pulmonary edema or focal pulmonary consolidation. Electronically Signed   By: PElmer PickerM.D.   On: 04/12/2022 08:21      LOS: 129days    LFlora Lipps MD [facility] 04/13/2022, 1:03 PM If 7PM-7AM, please contact night-coverage [email or web address]

## 2022-04-14 DIAGNOSIS — R410 Disorientation, unspecified: Secondary | ICD-10-CM | POA: Diagnosis not present

## 2022-04-14 DIAGNOSIS — R509 Fever, unspecified: Secondary | ICD-10-CM | POA: Diagnosis not present

## 2022-04-14 DIAGNOSIS — I10 Essential (primary) hypertension: Secondary | ICD-10-CM | POA: Diagnosis not present

## 2022-04-14 DIAGNOSIS — E785 Hyperlipidemia, unspecified: Secondary | ICD-10-CM | POA: Diagnosis not present

## 2022-04-14 LAB — GLUCOSE, CAPILLARY
Glucose-Capillary: 103 mg/dL — ABNORMAL HIGH (ref 70–99)
Glucose-Capillary: 116 mg/dL — ABNORMAL HIGH (ref 70–99)
Glucose-Capillary: 131 mg/dL — ABNORMAL HIGH (ref 70–99)
Glucose-Capillary: 132 mg/dL — ABNORMAL HIGH (ref 70–99)
Glucose-Capillary: 97 mg/dL (ref 70–99)

## 2022-04-14 LAB — CBC
HCT: 36.5 % (ref 36.0–46.0)
Hemoglobin: 12.3 g/dL (ref 12.0–15.0)
MCH: 30.4 pg (ref 26.0–34.0)
MCHC: 33.7 g/dL (ref 30.0–36.0)
MCV: 90.1 fL (ref 80.0–100.0)
Platelets: 350 10*3/uL (ref 150–400)
RBC: 4.05 MIL/uL (ref 3.87–5.11)
RDW: 13.7 % (ref 11.5–15.5)
WBC: 11.9 10*3/uL — ABNORMAL HIGH (ref 4.0–10.5)
nRBC: 0 % (ref 0.0–0.2)

## 2022-04-14 LAB — BASIC METABOLIC PANEL
Anion gap: 9 (ref 5–15)
BUN: 22 mg/dL (ref 8–23)
CO2: 22 mmol/L (ref 22–32)
Calcium: 8.9 mg/dL (ref 8.9–10.3)
Chloride: 107 mmol/L (ref 98–111)
Creatinine, Ser: 0.69 mg/dL (ref 0.44–1.00)
GFR, Estimated: >60 mL/min (ref >60)
Glucose, Bld: 100 mg/dL — ABNORMAL HIGH (ref 70–99)
Potassium: 4.4 mmol/L (ref 3.5–5.1)
Sodium: 138 mmol/L (ref 135–145)

## 2022-04-14 LAB — MAGNESIUM: Magnesium: 2.2 mg/dL (ref 1.7–2.4)

## 2022-04-14 NOTE — Progress Notes (Signed)
PROGRESS NOTE    [name] Giles  NLZ:[id number] DOB: 1955-09-20 DOA: 03/29/2022 PCP: Redmond School, MD   Brief Narrative:  [name] Giles is a 66 y.o. female with past medical history of COPD, tobacco use and asthma presented to hospital after being found on the floor. 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 Patient was initially admitted to the ICU for management.  Patient underwent ventriculostomy secondary to hydrocephalus which has improved. PT/OT recommended acute rehabilitation. Patient did have delirium during hospitalization and was noted to have a fever.  Neurology was reconsulted due to history of intraventricular hemorrhage and drain placement in the brain.  Neurology recommended antibiotic for possible lung infection.  After initiation of antibiotic patient has improved.  Plan is CIR when medically stable.  Assessment and Plan: Principal Problem:   Intracerebral hemorrhage (HCC) Active Problems:   Delirium   Essential hypertension   Hyperlipidemia   Cerebral AVM   Temporal intracerebral hemorrhage As per neurology likely secondary to dural  AVF.  Patient initially admitted to ICU under stroke service and was noted to have large left temporal and frontal intracranial hemorrhage with intraventricular extension and left uncal herniation and midline shift of 5 mm . MRI was then performed with similar findings in addition to increased midline shift to 7 mm. Repeat CT head (8/11) significant for increased midline shift to 10 mm. CTA head/neck (8/11) significant for AVF/AVM of left temporal lobe.  Patient underwent ventriculostomy during hospitalization.  CT head (8/12) was significant for stabilized lesions. LDL of 110, hemoglobin A1C of 5.7% and 2D Echocardiogram significant for LVEF of 60-65% with no atrial level shunt identified and no thrombus identified. PT/OT has recommended acute inpatient rehabilitation.  Delirium Patient is a little more  alert awake.  Following few commands.  Still disoriented and confused.  Concern for aspiration but chest x-ray was negative.  Urinalysis negative.  blood cultures negative in 24 hours.   We will continue aspiration precautions.  Initially needed Precedex in the ICU delirium.  Bladder scan done without any significant urinary retention.   No evidence of suture line infection.  CT scan of the head with resolving intracerebral hemorrhage.  Neurology was consulted due to delirium and recommend antibiotics for possible lung infection.  Less likely to be CNS infection related as per neurology.   Temperature max of 100.7 F temperature this morning 98.1.   Leukocytosis has slightly trended down from 18.6-15.7>11.7.We will continue to monitor and de-escalate antibiotic by tomorrow if continues to improve.  Blood cultures negative in 2 days.  Cerebral edema/Obstructive hydrocephalus Neurosurgery was consulted, patient was initially put on 3% hypertonic saline from 8/10 /23 until 03/31/22. Right frontal ventriculostomy was placed on 03/29/22 by neurosurgery and was removed on 04/06/22.  Left temporal AVF/AVM Likely source of hemorrhage. Neurosurgery recommending angiogram which will need to be performed when medically stable and not confused.  Hypertension Previous history of hypertension.  Was initially on Cleviprex drip.  On low-dose amlodipine 2.5 mg daily at this time.  Hyperlipidemia LDL of 119.   on Crestor  Dysphagia on regular diet, needs assistance with feeding..  Tobacco use Not on nicotine replacement at this time.  Hypokalemia Improved with replacement.  Latest potassium of 4.4  Hypernatremia Improved at this time.  Latest sodium of 138.  Prediabetes Hemoglobin A1C calculated to be 5.7%.  Closely monitor.  COPD Supposed to be on Symbicort and albuterol was not not taking. Currently no exacerbation. -Continue Pulmicort and Brovana,  Duoneb PRN  DVT prophylaxis: Heparin subq  Code  Status:   Code Status: Full Code  Family Communication:  I spoke with the patient's son on the phone at bedside on 04/13/2022.  Disposition Plan:  Plan for for inpatient rehab likely in 1 to 2 days if patient continues to improve..   Consultants:  Neurology/Stroke Neurosurgery PCCM  Procedures:  Ventriculostomy NG tube placement and removal  Antimicrobials: None  Subjective: Today, patient was seen and examined at bedside.  Appears to be more alert awake and communicative.  Following few commands.  Appears improved but still confused and disoriented.  Denies pain.   Objective: BP 111/76 (BP Location: Left Arm)   Pulse (!) 106   Temp 100.1 F (37.8 C) (Oral)   Resp 16   Ht '5\' 5"'$  (1.651 m)   Wt 66.2 kg   SpO2 94%   BMI 24.29 kg/m   Body mass index is 24.29 kg/m.   General:  Average built, not in obvious distress, appears weak and deconditioned, HENT:   No scleral pallor or icterus noted. Oral mucosa is moist.  Chest:    Diminished breath sounds bilaterally. CVS: S1 &S2 heard. No murmur.  Regular rate and rhythm. Abdomen: Soft, nontender, nondistended.  Bowel sounds are heard.   Extremities: No cyanosis, clubbing or edema.  Peripheral pulses are palpable. Psych: Alert, awake in Communicative, confused and disoriented, follows simple commands CNS: Generalized weakness noted, confused disoriented but more alert awake. Skin: Warm and dry.  No rashes noted.  Scalp sutures   Data Reviewed:   I have personally reviewed the following labs and imaging studies.    CBC Lab Results  Component Value Date   WBC 11.9 (H) 04/14/2022   RBC 4.05 04/14/2022   HGB 12.3 04/14/2022   HCT 36.5 04/14/2022   MCV 90.1 04/14/2022   MCH 30.4 04/14/2022   PLT 350 04/14/2022   MCHC 33.7 04/14/2022   RDW 13.7 04/14/2022   LYMPHSABS 2.3 04/05/2022   MONOABS 1.2 (H) 04/05/2022   EOSABS 0.0 04/05/2022   BASOSABS 0.0 87/56/4332     Last metabolic panel Lab Results  Component  Value Date   NA 138 04/14/2022   K 4.4 04/14/2022   CL 107 04/14/2022   CO2 22 04/14/2022   BUN 22 04/14/2022   CREATININE 0.69 04/14/2022   GLUCOSE 100 (H) 04/14/2022   GFRNONAA >60 04/14/2022   GFRAA >60 12/03/2019   CALCIUM 8.9 04/14/2022   PHOS 3.4 04/02/2022   PROT 6.5 04/13/2022   ALBUMIN 2.9 (L) 04/13/2022   BILITOT 0.7 04/13/2022   ALKPHOS 63 04/13/2022   AST 34 04/13/2022   ALT 67 (H) 04/13/2022   ANIONGAP 9 04/14/2022    GFR: Estimated Creatinine Clearance: 62.2 mL/min (by C-G formula based on SCr of 0.69 mg/dL).  Recent Results (from the past 240 hour(s))  Culture, blood (Routine X 2) w Reflex to ID Panel     Status: None (Preliminary result)   Collection Time: 04/12/22  8:49 AM   Specimen: BLOOD  Result Value Ref Range Status   Specimen Description BLOOD SITE NOT SPECIFIED  Final   Special Requests   Final    BOTTLES DRAWN AEROBIC AND ANAEROBIC Blood Culture adequate volume   Culture   Final    NO GROWTH 2 DAYS Performed at [facility], [address], [place],  [address]    Report Status PENDING  Incomplete  Culture, blood (Routine X 2) w Reflex to ID Panel  Status: None (Preliminary result)   Collection Time: 04/12/22  8:49 AM   Specimen: BLOOD  Result Value Ref Range Status   Specimen Description BLOOD SITE NOT SPECIFIED  Final   Special Requests   Final    BOTTLES DRAWN AEROBIC AND ANAEROBIC Blood Culture adequate volume   Culture   Final    NO GROWTH 2 DAYS Performed at [facility], [address], [place], [place] [address]    Report Status PENDING  Incomplete      Radiology Studies: DG Swallowing Func-Speech Pathology  Result Date: 04/12/2022 Table formatting from the original result was not included. Objective Swallowing Evaluation: Type of Study: MBS-Modified Barium Swallow Study  Patient Details Name: VICENTA OLDS MRN: [id number] Date of Birth: 27-Mar-1956 Today's Date: 04/12/2022 Time: SLP Start Time (ACUTE ONLY):  1455 -SLP Stop Time (ACUTE ONLY): 1515 SLP Time Calculation (min) (ACUTE ONLY): 20 min Past Medical History: Past Medical History: Diagnosis Date  Asthma   Medical history non-contributory  Past Surgical History: Past Surgical History: Procedure Laterality Date  COLONOSCOPY    COLONOSCOPY N/A 04/06/2013  Procedure: COLONOSCOPY;  Surgeon: Daneil Dolin, MD;  Location: [facility];  Service: Endoscopy;  Laterality: N/A;  9:30 AM  ENDOMETRIAL ABLATION    IR ANGIO INTRA EXTRACRAN SEL INTERNAL CAROTID BILAT MOD SED  04/04/2022  IR US GUIDE VASC ACCESS RIGHT  04/04/2022 HPI: Pt is a 66 y/o female who presented with headache, N/V, and AMS. CTH revealed a large L frontotemporal ICH with IVH and 5m midline shift. Repeat head CT with increasing hemorrhage and associated hydrocephalus. Pt s/p ventriculostomy 8/10. MBS 8/18 WFL; regular texture diet and thin liquids recommended Repeat CT head 8/24 due to change in mentation: Expected evolution of left temporal lobe hemorrhage. Stable intraventricular hemorrhage, left greater than right, decreased mass effect with decreasing edema around the hemorrhage and decreasing midline shift. Urinalysis negative, CXR negative.PMH: COPD, tobacco abuse, asthma.  No data recorded  Recommendations for follow up therapy are one component of a multi-disciplinary discharge planning process, led by the attending physician.  Recommendations may be updated based on patient status, additional functional criteria and insurance authorization. Assessment / Plan / Recommendation   04/12/2022   2:55 PM Clinical Impressions Clinical Impression Pt has demonstrated a decline in swallow function since 8/18. Pt exhibited oropharyngeal dysphagia characterized by reduced bolus awareness, impaired mastication, and a pharyngeal delay. She demonstrated intermittent oral holding, and prolonged mastication which necessitated cues for initiation and swallowing. The swallow was often triggered with solid boluses at the  level of the valleculae or pyriforms and cues were intermittently necessary for pt to swallow solids. Trace aspiration (PAS 7) was noted once with consecutive swallows of thin liquids via straw secondary to the pharyngeal delay and pt's independent coughing expelled the aspirate. However, aspiration could not be replicated despite provision of additional boluses of thin liquids via straw. Pt consistently spat out the pill when it was given in puree and with thin liquids. Coughing continues to be noted in the absence of aspiration as well so SLP suspects that all coughing at bedside should not be attributed to aspiration especially if pt's mentation is at baseline. Considering pt's now impaired bolus awareness and mastication, a dysphagia 1 diet with thin liquids will be initiated with strict observance of swallowing precautions. SLP will continue to follow pt. SLP Visit Diagnosis Dysphagia, oropharyngeal phase (R13.12) Impact on safety and function Mild aspiration risk     04/12/2022  2:55 PM Treatment Recommendations Treatment Recommendations Therapy as outlined in treatment plan below     04/12/2022   2:55 PM Prognosis Prognosis for Safe Diet Advancement Good Barriers to Reach Goals Language deficits;Cognitive deficits   04/12/2022   2:55 PM Diet Recommendations SLP Diet Recommendations Thin liquid;Dysphagia 1 (Puree) solids Liquid Administration via Cup;No straw Medication Administration Crushed with puree Compensations Minimize environmental distractions;Slow rate;Small sips/bites;Follow solids with liquid Postural Changes Remain semi-upright after after feeds/meals (Comment);Seated upright at 90 degrees     04/12/2022   2:55 PM Other Recommendations Oral Care Recommendations Oral care BID Follow Up Recommendations Acute inpatient rehab (3hours/day) Assistance recommended at discharge Frequent or constant Supervision/Assistance Functional Status Assessment Patient has had a recent decline in their functional status  and demonstrates the ability to make significant improvements in function in a reasonable and predictable amount of time.   04/12/2022   2:55 PM Frequency and Duration  Speech Therapy Frequency (ACUTE ONLY) min 2x/week Treatment Duration 2 weeks     04/12/2022   2:55 PM Oral Phase Oral Phase Impaired Oral - Nectar Cup Delayed oral transit;Holding of bolus Oral - Nectar Straw Delayed oral transit;Holding of bolus Oral - Thin Cup Delayed oral transit;Holding of bolus Oral - Thin Straw Delayed oral transit;Holding of bolus Oral - Puree Delayed oral transit;Holding of bolus Oral - Regular Delayed oral transit;Holding of bolus;Impaired mastication Oral - Pill Delayed oral transit;Holding of bolus    04/12/2022   2:55 PM Pharyngeal Phase Pharyngeal Phase Impaired Pharyngeal- Nectar Cup Delayed swallow initiation-pyriform sinuses;Delayed swallow initiation-vallecula Pharyngeal- Nectar Straw Delayed swallow initiation-pyriform sinuses;Delayed swallow initiation-vallecula Pharyngeal- Thin Cup Delayed swallow initiation-pyriform sinuses;Delayed swallow initiation-vallecula Pharyngeal- Thin Straw Delayed swallow initiation-pyriform sinuses;Delayed swallow initiation-vallecula Pharyngeal- Puree Delayed swallow initiation-vallecula Pharyngeal- Regular Delayed swallow initiation-vallecula Pharyngeal- Pill NT    04/12/2022   2:55 PM Cervical Esophageal Phase  Cervical Esophageal Phase [facility] [address] [address], [place], [place] Office number [phone] Horton Marshall 04/12/2022, 3:56 PM                     CT HEAD WO CONTRAST (5MM)  Result Date: 04/12/2022 CLINICAL DATA:  Intraventricular hemorrhage and drain. EXAM: CT HEAD WITHOUT CONTRAST TECHNIQUE: Contiguous axial images were obtained from the base of the skull through the vertex without intravenous contrast. RADIATION DOSE REDUCTION: This exam was performed according to the departmental dose-optimization program which includes automated exposure  control, adjustment of the mA and/or kV according to patient size and/or use of iterative reconstruction technique. COMPARISON:  CT head without contrast 04/06/2022 and 03/31/2022. FINDINGS: Brain: The right-sided ventricular catheter scratched at the right-sided ventriculostomy catheter has been removed. Expected evolution of a left temporal lobe hemorrhage noted. The borders are now less distinct. Surrounding edema is slightly less prominent. Mass effect remains with partial effacement of the left lateral ventricle and midline shift. Midline shift now measures 7.5 mm. Stable intraventricular hemorrhage is present, left greater than right. The right frontal ventriculostomy catheter has been removed. Minimal residual edema is noted along the tract. No new hemorrhage or progressive mass effect is present. 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 No acute cortical abnormality is present.  Basal ganglia are intact. The brainstem and cerebellum are within normal limits. Vascular: Atherosclerotic calcifications are present within the cavernous internal carotid arteries bilaterally. No hyperdense vessel is present. Skull: Right frontal burr hole noted. Calvarium is otherwise intact. No focal lytic or blastic lesions  are present. Skin staples are present in the right frontal scalp with a ventriculostomy catheter was. Sinuses/Orbits: The paranasal sinuses and mastoid air cells are clear. The globes and orbits are within normal limits. IMPRESSION: 1. Expected evolution of left temporal lobe hemorrhage. 2. Stable intraventricular hemorrhage, left greater than right. 3. Removal of right frontal ventriculostomy catheter. 4. 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 5. Decreased mass effect with decreasing edema around the hemorrhage and decreasing midline shift. No new hemorrhage. Electronically Signed   By: San Morelle M.D.   On: 04/12/2022 14:06   EEG adult  Result Date: 04/12/2022 Lora Havens, MD     04/13/2022  8:18 AM Patient Name: BRITTNEI JAGIELLO MRN: [id number] Epilepsy Attending: Lora Havens Referring Physician/Provider: Rosalin Hawking, MD Date: 04/12/2022 Duration: 23.50 mins Patient history: 66 year old with left temporal hemorrhage and altered mental status.  EEG to evaluate for seizure. Level of alertness: lethargic, asleep AEDs during EEG study: None Technical aspects: This EEG study was done with scalp electrodes positioned according to the 10-20 International system of electrode placement. Electrical activity was reviewed with band pass filter of 1-'70Hz'$ , sensitivity of 7 uV/mm, display speed of 60m/sec with a '60Hz'$  notched filter applied as appropriate. EEG data were recorded continuously and digitally stored.  Video monitoring was available and reviewed as appropriate. Description: During awake state, no clear posterior dominant rhythm was seen.  EEG showed continuous generalized predominantly 5 to 7 Hz theta slowing admixed with intermittent generalized high amplitude polymorphic sharply contoured 3 to 6 Hz theta-delta slowing.  Sleep was characterized by sleep spindles (12 to 40 Hz), maximal frontocentral region. Hyperventilation and photic stimulation were not performed.   ABNORMALITY - Continuous slow, generalized IMPRESSION: This study is suggestive of moderate diffuse encephalopathy, nonspecific etiology. No seizures or epileptiform discharges were seen throughout the recording. If suspicion for interictal activity remains a concern, a prolonged study can be considered. PLora Havens     LOS: 16 days    LFlora Lipps MD [facility] 04/14/2022, 11:21 AM If 7PM-7AM, please contact night-coverage [email or web address]

## 2022-04-14 NOTE — Progress Notes (Signed)
Vital signs rechecked after PRN tylenol dose.     04/14/22 1732  Vitals  Temp 100.2 F (37.9 C)  Temp Source Oral  BP (!) 106/53  MAP (mmHg) 69  BP Method Automatic  Pulse Rate (!) 101  Pulse Rate Source Monitor  Resp 16  MEWS COLOR  MEWS Score Color Green  Oxygen Therapy  SpO2 94 %  MEWS Score  MEWS Temp 0  MEWS Systolic 0  MEWS Pulse 1  MEWS RR 0  MEWS LOC 0  MEWS Score 1

## 2022-04-14 NOTE — Progress Notes (Signed)
PRN Tylenol given for fever of 102.5    04/14/22 1548  Assess: MEWS Score  Temp (!) 102.5 F (39.2 C)  BP 129/64  MAP (mmHg) 79  Pulse Rate (!) 103  Resp 16  SpO2 100 %  O2 Device Room Air  Assess: MEWS Score  MEWS Temp 2  MEWS Systolic 0  MEWS Pulse 1  MEWS RR 0  MEWS LOC 0  MEWS Score 3  MEWS Score Color Yellow  Assess: if the MEWS score is Yellow or Red  Were vital signs taken at a resting state? Yes  Focused Assessment No change from prior assessment  Does the patient meet 2 or more of the SIRS criteria? Yes  Does the patient have a confirmed or suspected source of infection? Yes  Provider and Rapid Response Notified? No  MEWS guidelines implemented *See Row Information* Yes  Treat  MEWS Interventions Administered prn meds/treatments (PRN Tylenol for fever)  Pain Scale Faces  Faces Pain Scale 2  Assess: SIRS CRITERIA  SIRS Temperature  1  SIRS Pulse 1  SIRS Respirations  0  SIRS WBC 1  SIRS Score Sum  3

## 2022-04-15 DIAGNOSIS — I10 Essential (primary) hypertension: Secondary | ICD-10-CM | POA: Diagnosis not present

## 2022-04-15 DIAGNOSIS — E785 Hyperlipidemia, unspecified: Secondary | ICD-10-CM | POA: Diagnosis not present

## 2022-04-15 DIAGNOSIS — R410 Disorientation, unspecified: Secondary | ICD-10-CM | POA: Diagnosis not present

## 2022-04-15 DIAGNOSIS — Q282 Arteriovenous malformation of cerebral vessels: Secondary | ICD-10-CM | POA: Diagnosis not present

## 2022-04-15 LAB — CBC
HCT: 34.2 % — ABNORMAL LOW (ref 36.0–46.0)
Hemoglobin: 11.4 g/dL — ABNORMAL LOW (ref 12.0–15.0)
MCH: 30.1 pg (ref 26.0–34.0)
MCHC: 33.3 g/dL (ref 30.0–36.0)
MCV: 90.2 fL (ref 80.0–100.0)
Platelets: 386 10*3/uL (ref 150–400)
RBC: 3.79 MIL/uL — ABNORMAL LOW (ref 3.87–5.11)
RDW: 13.6 % (ref 11.5–15.5)
WBC: 10 10*3/uL (ref 4.0–10.5)
nRBC: 0 % (ref 0.0–0.2)

## 2022-04-15 LAB — GLUCOSE, CAPILLARY
Glucose-Capillary: 86 mg/dL (ref 70–99)
Glucose-Capillary: 94 mg/dL (ref 70–99)
Glucose-Capillary: 106 mg/dL — ABNORMAL HIGH (ref 70–99)
Glucose-Capillary: 100 mg/dL — ABNORMAL HIGH (ref 70–99)

## 2022-04-15 LAB — BASIC METABOLIC PANEL
Anion gap: 8 (ref 5–15)
BUN: 22 mg/dL (ref 8–23)
CO2: 25 mmol/L (ref 22–32)
Calcium: 8.6 mg/dL — ABNORMAL LOW (ref 8.9–10.3)
Chloride: 103 mmol/L (ref 98–111)
Creatinine, Ser: 0.61 mg/dL (ref 0.44–1.00)
GFR, Estimated: >60 mL/min (ref >60)
Glucose, Bld: 115 mg/dL — ABNORMAL HIGH (ref 70–99)
Potassium: 3.8 mmol/L (ref 3.5–5.1)
Sodium: 136 mmol/L (ref 135–145)

## 2022-04-15 NOTE — Progress Notes (Signed)
Pharmacy Antibiotic Note  [name] Giles is a 66 y.o. 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  Pharmacy has been consulted for Vancomycin dosing for empiric meningitis treatment.  Plan: Vancomycin 750 mg IV q12h per the meningitis nomogram (CrCl 62.2, Wt 66.2 kg). Target  vancomycin trough 15-20 mcg/l  MD dosing Ceftriaxone 2g IV q12h  Monitor clinical status, renal function and culture results daily.   Vancomycin trough per protocol at steady state ordered for 8/28 at 0330.  Height: '5\' 5"'$  (165.1 cm) Weight: 66.2 kg (145 lb 15.1 oz) IBW/kg (Calculated) : 57  Temp (24hrs), Avg:100.6 F (38.1 C), Min:99.3 F (37.4 C), Max:102.5 F (39.2 C)  Recent Labs  Lab 04/11/22 0816 04/12/22 0317 04/13/22 0416 04/14/22 0336 04/15/22 0206  WBC 12.4* 18.6* 15.7* 11.9* 10.0  CREATININE 0.73 0.75 0.69 0.69 0.61     Estimated Creatinine Clearance: 62.2 mL/min (by C-G formula based on SCr of 0.61 mg/dL).    No Known Allergies  Antimicrobials this admission: CTX 8/24 >> Vanc 8/24>>  Dose adjustments this admission: None.  Microbiology results: 8/24 BCx x2: sent 8/24 BCx x2: sent  8/10 MRSA PCR: neg 8/10 Covid pcr: neg:  Flu: neg   Thank you for allowing pharmacy to be a part of this patient's care.  Varney Daily, PharmD PGY2 Pharmacy Resident  Please check AMION for all [facility] pharmacy phone numbers After 10:00 PM call main pharmacy [phone]

## 2022-04-15 NOTE — Progress Notes (Signed)
PROGRESS NOTE    [name] Giles  WVP:[id number] DOB: 01-Jan-1956 DOA: 03/29/2022 PCP: Redmond School, MD   Brief Narrative:  [name] ANASTAS is a 66 y.o. female with past medical history of COPD, tobacco use and asthma presented to hospital after being found on the floor. 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 Patient was initially admitted to the ICU for management.  Patient underwent ventriculostomy secondary to hydrocephalus which has improved. PT/OT recommended acute rehabilitation.   Patient did have delirium during hospitalization and was noted to have a fever.  Neurology was reconsulted due to history of intraventricular hemorrhage and drain placement in the brain.  Neurology recommended antibiotic for possible lung infection.  After initiation of antibiotic, patient has been more alert awake but still confused and disoriented.  Plan is CIR when medically stable.  Assessment and Plan: Principal Problem:   Intracerebral hemorrhage (HCC) Active Problems:   Delirium   Essential hypertension   Hyperlipidemia   Cerebral AVM   Temporal intracerebral hemorrhage As per neurology likely secondary to dural  AVF.  Patient initially admitted to ICU under stroke service and was noted to have large left temporal and frontal intracranial hemorrhage with intraventricular extension and left uncal herniation and midline shift of 5 mm . MRI was then performed with similar findings in addition to increased midline shift to 7 mm. Repeat CT head (8/11) significant for increased midline shift to 10 mm. CTA head/neck (8/11) significant for AVF/AVM of left temporal lobe.  Patient underwent ventriculostomy during hospitalization.. LDL of 110, hemoglobin A1C of 5.7% and 2D Echocardiogram significant for LVEF of 60-65% with no atrial level shunt identified and no thrombus identified. PT/OT has recommended acute inpatient rehabilitation.  Repeat CT scan of the head showing  decreasing size of intracerebral hemorrhage.  Delirium Concern for aspiration but chest x-ray was negative.  Urinalysis negative.  blood cultures negative in 2 days.  We will continue aspiration precautions.  Initially needed Precedex in the ICU delirium.   Neurology was consulted due to delirium and recommend antibiotics for possible lung infection.  Less likely to be CNS infection related as per neurology.   Temperature max of 102.5 F.    Leukocytosis has slightly trended down from 18.6-15.7>11.7>10.0.  We will continue antibiotic at this time due to high-grade fever.  Patient is more communicative today questions but still disoriented.  Cerebral edema/Obstructive hydrocephalus Neurosurgery was consulted, patient was initially put on 3% hypertonic saline from 8/10 /23 until 03/31/22. Right frontal ventriculostomy was placed on 03/29/22 by neurosurgery and was removed on 04/06/22.  Left temporal AVF/AVM Likely source of hemorrhage. Neurosurgery recommending angiogram which will need to be performed when medically stable and not confused.  Hypertension Previous history of hypertension.  Was initially on Cleviprex drip.  On low-dose amlodipine 2.5 mg daily at this time.  Hyperlipidemia LDL of 119.   on Crestor  Dysphagia on regular diet, needs assistance with feeding..  Tobacco use Not on nicotine replacement at this time.  Hypokalemia Improved with replacement.    Hypernatremia Improved at this time.   Prediabetes Hemoglobin A1C calculated to be 5.7%.  Closely monitor.  COPD Supposed to be on Symbicort and albuterol was not not taking. Currently no exacerbation. -Continue Pulmicort and Brovana, Duoneb PRN  DVT prophylaxis: Heparin subq  Code Status:   Code Status: Full Code  Family Communication:  I spoke with the patient's son on the phone at bedside on 04/13/2022.  Disposition Plan:  Plan  for for inpatient rehab likely in 1 to 2 days if patient continues to improve.    Consultants:  Neurology/Stroke Neurosurgery PCCM  Procedures:  Ventriculostomy NG tube placement and removal  Antimicrobials: None  Subjective: Today, patient was seen and examined at bedside.  Patient appears to be more alert awake today answering few questions but disoriented.  Able to follow few commands.  Objective: BP 116/83 (BP Location: Left Arm)   Pulse 74   Temp 99.3 F (37.4 C) (Oral)   Resp 19   Ht '5\' 5"'$  (1.651 m)   Wt 66.2 kg   SpO2 96%   BMI 24.29 kg/m   Body mass index is 24.29 kg/m.   General:  Average built, not in obvious distress, weak and deconditioned, HENT:   No scleral pallor or icterus noted. Oral mucosa is moist.  Chest:    Diminished breath sounds bilaterally. No crackles or wheezes.  CVS: S1 &S2 heard. No murmur.  Regular rate and rhythm. Abdomen: Soft, nontender, nondistended.  Bowel sounds are heard.   Extremities: No cyanosis, clubbing or edema.  Peripheral pulses are palpable. Psych: Alert, awake and communicative, follows few commands, partial comprehension but disoriented and confused at times CNS:  No cranial nerve deficits.  Power equal in all extremities.   Skin: Warm and dry.  Scalp sutures  Data Reviewed:   I have personally reviewed the following labs and imaging studies.    CBC Lab Results  Component Value Date   WBC 10.0 04/15/2022   RBC 3.79 (L) 04/15/2022   HGB 11.4 (L) 04/15/2022   HCT 34.2 (L) 04/15/2022   MCV 90.2 04/15/2022   MCH 30.1 04/15/2022   PLT 386 04/15/2022   MCHC 33.3 04/15/2022   RDW 13.6 04/15/2022   LYMPHSABS 2.3 04/05/2022   MONOABS 1.2 (H) 04/05/2022   EOSABS 0.0 04/05/2022   BASOSABS 0.0 62/69/4854     Last metabolic panel Lab Results  Component Value Date   NA 136 04/15/2022   K 3.8 04/15/2022   CL 103 04/15/2022   CO2 25 04/15/2022   BUN 22 04/15/2022   CREATININE 0.61 04/15/2022   GLUCOSE 115 (H) 04/15/2022   GFRNONAA >60 04/15/2022   GFRAA >60 12/03/2019   CALCIUM 8.6 (L)  04/15/2022   PHOS 3.4 04/02/2022   PROT 6.5 04/13/2022   ALBUMIN 2.9 (L) 04/13/2022   BILITOT 0.7 04/13/2022   ALKPHOS 63 04/13/2022   AST 34 04/13/2022   ALT 67 (H) 04/13/2022   ANIONGAP 8 04/15/2022    GFR: Estimated Creatinine Clearance: 62.2 mL/min (by C-G formula based on SCr of 0.61 mg/dL).  Recent Results (from the past 240 hour(s))  Culture, blood (Routine X 2) w Reflex to ID Panel     Status: None (Preliminary result)   Collection Time: 04/12/22  8:49 AM   Specimen: BLOOD  Result Value Ref Range Status   Specimen Description BLOOD SITE NOT SPECIFIED  Final   Special Requests   Final    BOTTLES DRAWN AEROBIC AND ANAEROBIC Blood Culture adequate volume   Culture   Final    NO GROWTH 2 DAYS Performed at [facility], [address], [place], [place] [address]    Report Status PENDING  Incomplete  Culture, blood (Routine X 2) w Reflex to ID Panel     Status: None (Preliminary result)   Collection Time: 04/12/22  8:49 AM   Specimen: BLOOD  Result Value Ref Range Status   Specimen Description BLOOD SITE NOT  SPECIFIED  Final   Special Requests   Final    BOTTLES DRAWN AEROBIC AND ANAEROBIC Blood Culture adequate volume   Culture   Final    NO GROWTH 2 DAYS Performed at [facility], [address] [place], [place] [address]    Report Status PENDING  Incomplete      Radiology Studies: No results found.    LOS: 17 days    Flora Lipps, MD [facility] 04/15/2022, 8:06 AM If 7PM-7AM, please contact night-coverage [email or web address]

## 2022-04-16 ENCOUNTER — Inpatient Hospital Stay (HOSPITAL_COMMUNITY)
Admission: RE | Admit: 2022-04-16 | Discharge: 2022-04-27 | DRG: 056 | Disposition: A | Discharge status: Home Health | Payer: 59 | Source: Intra-hospital | Attending: Physical Medicine and Rehabilitation | Admitting: Physical Medicine and Rehabilitation

## 2022-04-16 ENCOUNTER — Encounter (HOSPITAL_COMMUNITY): Payer: Self-pay | Admitting: Physical Medicine and Rehabilitation

## 2022-04-16 ENCOUNTER — Other Ambulatory Visit: Payer: Self-pay

## 2022-04-16 DIAGNOSIS — G935 Compression of brain: Secondary | ICD-10-CM | POA: Diagnosis present

## 2022-04-16 DIAGNOSIS — Q282 Arteriovenous malformation of cerebral vessels: Secondary | ICD-10-CM | POA: Diagnosis not present

## 2022-04-16 DIAGNOSIS — I959 Hypotension, unspecified: Secondary | ICD-10-CM | POA: Diagnosis present

## 2022-04-16 DIAGNOSIS — I69191 Dysphagia following nontraumatic intracerebral hemorrhage: Secondary | ICD-10-CM | POA: Diagnosis present

## 2022-04-16 DIAGNOSIS — Z79899 Other long term (current) drug therapy: Secondary | ICD-10-CM

## 2022-04-16 DIAGNOSIS — B9689 Other specified bacterial agents as the cause of diseases classified elsewhere: Secondary | ICD-10-CM | POA: Diagnosis present

## 2022-04-16 DIAGNOSIS — Z8249 Family history of ischemic heart disease and other diseases of the circulatory system: Secondary | ICD-10-CM

## 2022-04-16 DIAGNOSIS — E8809 Other disorders of plasma-protein metabolism, not elsewhere classified: Secondary | ICD-10-CM | POA: Diagnosis present

## 2022-04-16 DIAGNOSIS — G4700 Insomnia, unspecified: Secondary | ICD-10-CM | POA: Diagnosis present

## 2022-04-16 DIAGNOSIS — D72829 Elevated white blood cell count, unspecified: Secondary | ICD-10-CM | POA: Diagnosis present

## 2022-04-16 DIAGNOSIS — E785 Hyperlipidemia, unspecified: Secondary | ICD-10-CM | POA: Diagnosis present

## 2022-04-16 DIAGNOSIS — R339 Retention of urine, unspecified: Secondary | ICD-10-CM | POA: Diagnosis present

## 2022-04-16 DIAGNOSIS — I69391 Dysphagia following cerebral infarction: Secondary | ICD-10-CM | POA: Diagnosis not present

## 2022-04-16 DIAGNOSIS — Z803 Family history of malignant neoplasm of breast: Secondary | ICD-10-CM

## 2022-04-16 DIAGNOSIS — R7881 Bacteremia: Secondary | ICD-10-CM | POA: Diagnosis present

## 2022-04-16 DIAGNOSIS — Z982 Presence of cerebrospinal fluid drainage device: Secondary | ICD-10-CM

## 2022-04-16 DIAGNOSIS — R1310 Dysphagia, unspecified: Secondary | ICD-10-CM | POA: Diagnosis present

## 2022-04-16 DIAGNOSIS — I611 Nontraumatic intracerebral hemorrhage in hemisphere, cortical: Secondary | ICD-10-CM

## 2022-04-16 DIAGNOSIS — R509 Fever, unspecified: Secondary | ICD-10-CM | POA: Diagnosis not present

## 2022-04-16 DIAGNOSIS — I10 Essential (primary) hypertension: Secondary | ICD-10-CM | POA: Diagnosis present

## 2022-04-16 DIAGNOSIS — Z20822 Contact with and (suspected) exposure to covid-19: Secondary | ICD-10-CM | POA: Diagnosis present

## 2022-04-16 DIAGNOSIS — Z833 Family history of diabetes mellitus: Secondary | ICD-10-CM | POA: Diagnosis not present

## 2022-04-16 DIAGNOSIS — G919 Hydrocephalus, unspecified: Secondary | ICD-10-CM | POA: Diagnosis present

## 2022-04-16 DIAGNOSIS — I69120 Aphasia following nontraumatic intracerebral hemorrhage: Secondary | ICD-10-CM

## 2022-04-16 DIAGNOSIS — I610 Nontraumatic intracerebral hemorrhage in hemisphere, subcortical: Secondary | ICD-10-CM

## 2022-04-16 DIAGNOSIS — F17210 Nicotine dependence, cigarettes, uncomplicated: Secondary | ICD-10-CM | POA: Diagnosis present

## 2022-04-16 DIAGNOSIS — Z23 Encounter for immunization: Secondary | ICD-10-CM

## 2022-04-16 DIAGNOSIS — E559 Vitamin D deficiency, unspecified: Secondary | ICD-10-CM | POA: Diagnosis present

## 2022-04-16 DIAGNOSIS — R410 Disorientation, unspecified: Secondary | ICD-10-CM | POA: Diagnosis not present

## 2022-04-16 DIAGNOSIS — Z741 Need for assistance with personal care: Secondary | ICD-10-CM | POA: Diagnosis present

## 2022-04-16 DIAGNOSIS — J449 Chronic obstructive pulmonary disease, unspecified: Secondary | ICD-10-CM | POA: Diagnosis present

## 2022-04-16 DIAGNOSIS — I619 Nontraumatic intracerebral hemorrhage, unspecified: Secondary | ICD-10-CM | POA: Diagnosis present

## 2022-04-16 LAB — CBC
HCT: 33.8 % — ABNORMAL LOW (ref 36.0–46.0)
Hemoglobin: 11.2 g/dL — ABNORMAL LOW (ref 12.0–15.0)
MCH: 30.4 pg (ref 26.0–34.0)
MCHC: 33.1 g/dL (ref 30.0–36.0)
MCV: 91.8 fL (ref 80.0–100.0)
Platelets: 370 10*3/uL (ref 150–400)
RBC: 3.68 MIL/uL — ABNORMAL LOW (ref 3.87–5.11)
RDW: 13.6 % (ref 11.5–15.5)
WBC: 11.2 10*3/uL — ABNORMAL HIGH (ref 4.0–10.5)
nRBC: 0 % (ref 0.0–0.2)

## 2022-04-16 LAB — GLUCOSE, CAPILLARY
Glucose-Capillary: 88 mg/dL (ref 70–99)
Glucose-Capillary: 108 mg/dL — ABNORMAL HIGH (ref 70–99)

## 2022-04-16 LAB — VANCOMYCIN, TROUGH: Vancomycin Tr: 11 ug/mL — ABNORMAL LOW (ref 15–20)

## 2022-04-16 LAB — CREATININE, SERUM
Creatinine, Ser: 0.59 mg/dL (ref 0.44–1.00)
GFR, Estimated: >60 mL/min (ref >60)

## 2022-04-16 MED ORDER — SODIUM CHLORIDE 0.9 % IV SOLN
1.5000 g | Freq: Four times a day (QID) | INTRAVENOUS | Status: DC
  Administered 2022-04-16 – 2022-04-18 (×7): 1.5 g via INTRAVENOUS
  Filled 2022-04-16 (×11): qty 4

## 2022-04-16 MED ORDER — ROSUVASTATIN CALCIUM 20 MG PO TABS: 20.0000 mg | ORAL_TABLET | Freq: Every day | ORAL | Status: DC

## 2022-04-16 MED ORDER — QUETIAPINE FUMARATE 25 MG PO TABS
25.0000 mg | ORAL_TABLET | Freq: Every evening | ORAL | Status: DC
  Administered 2022-04-16 – 2022-04-17 (×2): 25 mg via ORAL
  Filled 2022-04-16 (×2): qty 1

## 2022-04-16 MED ORDER — HEPARIN SODIUM (PORCINE) 5000 UNIT/ML IJ SOLN
5000.0000 [IU] | Freq: Three times a day (TID) | INTRAMUSCULAR | Status: DC
  Administered 2022-04-16 – 2022-04-27 (×32): 5000 [IU] via SUBCUTANEOUS
  Filled 2022-04-16 (×32): qty 1

## 2022-04-16 MED ORDER — OXYCODONE HCL 5 MG PO TABS
5.0000 mg | ORAL_TABLET | Freq: Four times a day (QID) | ORAL | 0 refills | Status: DC | PRN
Start: 2022-04-16 — End: 2022-04-27

## 2022-04-16 MED ORDER — SENNOSIDES-DOCUSATE SODIUM 8.6-50 MG PO TABS
1.0000 | ORAL_TABLET | Freq: Two times a day (BID) | ORAL | Status: DC
  Administered 2022-04-16 – 2022-04-27 (×21): 1 via ORAL
  Filled 2022-04-16 (×22): qty 1

## 2022-04-16 MED ORDER — ADULT MULTIVITAMIN W/MINERALS CH: 1.0000 | ORAL_TABLET | Freq: Every day | ORAL | Status: AC

## 2022-04-16 MED ORDER — MELATONIN 3 MG PO TABS
3.0000 mg | ORAL_TABLET | Freq: Every day | ORAL | Status: DC
  Administered 2022-04-16 – 2022-04-26 (×11): 3 mg via ORAL
  Filled 2022-04-16 (×11): qty 1

## 2022-04-16 MED ORDER — ACETAMINOPHEN 650 MG RE SUPP: 650.0000 mg | RECTAL | Status: DC | PRN

## 2022-04-16 MED ORDER — SENNOSIDES-DOCUSATE SODIUM 8.6-50 MG PO TABS: 1.0000 | ORAL_TABLET | Freq: Two times a day (BID) | ORAL | Status: AC

## 2022-04-16 MED ORDER — ENSURE ENLIVE PO LIQD: 237.0000 mL | Freq: Two times a day (BID) | ORAL | Status: DC

## 2022-04-16 MED ORDER — ARFORMOTEROL TARTRATE 15 MCG/2ML IN NEBU
15.0000 ug | INHALATION_SOLUTION | Freq: Two times a day (BID) | RESPIRATORY_TRACT | Status: DC
  Administered 2022-04-16 – 2022-04-27 (×21): 15 ug via RESPIRATORY_TRACT
  Filled 2022-04-16 (×22): qty 2

## 2022-04-16 MED ORDER — ADULT MULTIVITAMIN W/MINERALS CH
1.0000 | ORAL_TABLET | Freq: Every day | ORAL | Status: DC
  Administered 2022-04-17 – 2022-04-27 (×10): 1 via ORAL
  Filled 2022-04-16 (×11): qty 1

## 2022-04-16 MED ORDER — ACETAMINOPHEN 160 MG/5ML PO SOLN: 650.0000 mg | ORAL | Status: DC | PRN

## 2022-04-16 MED ORDER — BETHANECHOL CHLORIDE 10 MG PO TABS
10.0000 mg | ORAL_TABLET | Freq: Three times a day (TID) | ORAL | Status: DC
  Administered 2022-04-16 – 2022-04-24 (×22): 10 mg via ORAL
  Filled 2022-04-16 (×23): qty 1

## 2022-04-16 MED ORDER — PNEUMOCOCCAL 20-VAL CONJ VACC 0.5 ML IM SUSY
0.5000 mL | PREFILLED_SYRINGE | INTRAMUSCULAR | Status: AC
  Administered 2022-04-18: 0.5 mL via INTRAMUSCULAR
  Filled 2022-04-16: qty 0.5

## 2022-04-16 MED ORDER — AMLODIPINE BESYLATE 2.5 MG PO TABS: 2.5000 mg | ORAL_TABLET | Freq: Every day | ORAL | Status: DC

## 2022-04-16 MED ORDER — HEPARIN SODIUM (PORCINE) 5000 UNIT/ML IJ SOLN: 5000.0000 [IU] | Freq: Three times a day (TID) | INTRAMUSCULAR | Status: DC

## 2022-04-16 MED ORDER — BUDESONIDE 0.5 MG/2ML IN SUSP
0.2500 mg | Freq: Two times a day (BID) | RESPIRATORY_TRACT | Status: DC
  Administered 2022-04-16 – 2022-04-27 (×22): 0.25 mg via RESPIRATORY_TRACT
  Filled 2022-04-16 (×22): qty 2

## 2022-04-16 MED ORDER — OXYCODONE HCL 5 MG PO TABS: 5.0000 mg | ORAL_TABLET | Freq: Four times a day (QID) | ORAL | Status: DC | PRN

## 2022-04-16 MED ORDER — ACETAMINOPHEN 325 MG PO TABS: 650.0000 mg | ORAL_TABLET | ORAL | Status: DC | PRN

## 2022-04-16 MED ORDER — ENSURE ENLIVE PO LIQD
237.0000 mL | Freq: Two times a day (BID) | ORAL | Status: DC
  Administered 2022-04-17 – 2022-04-27 (×14): 237 mL via ORAL

## 2022-04-16 MED ORDER — IPRATROPIUM-ALBUTEROL 0.5-2.5 (3) MG/3ML IN SOLN: 3.0000 mL | Freq: Four times a day (QID) | RESPIRATORY_TRACT | Status: DC | PRN

## 2022-04-16 MED ORDER — ROSUVASTATIN CALCIUM 20 MG PO TABS
20.0000 mg | ORAL_TABLET | Freq: Every day | ORAL | Status: DC
  Administered 2022-04-17 – 2022-04-27 (×10): 20 mg via ORAL
  Filled 2022-04-16 (×11): qty 1

## 2022-04-16 MED ORDER — AMPICILLIN-SULBACTAM SODIUM 1.5 (1-0.5) G IJ SOLR
1.5000 g | Freq: Four times a day (QID) | INTRAMUSCULAR | 0 refills | Status: DC
Start: 2022-04-16 — End: 2022-04-27

## 2022-04-16 MED ORDER — REVEFENACIN 175 MCG/3ML IN SOLN
175.0000 ug | Freq: Every day | RESPIRATORY_TRACT | Status: DC
  Administered 2022-04-17 – 2022-04-27 (×11): 175 ug via RESPIRATORY_TRACT
  Filled 2022-04-16 (×11): qty 3

## 2022-04-16 MED ORDER — ACETAMINOPHEN 325 MG PO TABS
650.0000 mg | ORAL_TABLET | ORAL | Status: DC | PRN
  Administered 2022-04-18: 650 mg via ORAL
  Filled 2022-04-16: qty 2

## 2022-04-16 MED ORDER — VANCOMYCIN HCL IN DEXTROSE 1-5 GM/200ML-% IV SOLN
1000.0000 mg | Freq: Two times a day (BID) | INTRAVENOUS | Status: DC
  Filled 2022-04-16 (×2): qty 200

## 2022-04-16 MED ORDER — QUETIAPINE FUMARATE 25 MG PO TABS: 25.0000 mg | ORAL_TABLET | Freq: Every evening | ORAL | Status: DC

## 2022-04-16 MED ORDER — MELATONIN 3 MG PO TABS: 3.0000 mg | ORAL_TABLET | Freq: Every day | ORAL | 0 refills | Status: DC

## 2022-04-16 NOTE — Progress Notes (Signed)
PMR Admission Coordinator Pre-Admission Assessment   Patient: [name] Giles is an 66 y.o., female MRN: [id number] DOB: 1955-11-18 Height: 5' 5"  (165.1 cm) Weight: 66.2 kg   Insurance Information HMO:     PPO:      PCP:      IPA:      80/20:      OTHER:  PRIMARY: [facility] other      Policy#: [id number]      Subscriber: Pt.  CM Name: Delsa Sale       Phone#:  [phone] ext [phone]    Fax#: [phone] Pt. Approved 8/28-9/5 with updates due 9/5 Pre-Cert#: [id number]      Employer:  Benefits:  Phone #:     Name:  Irene Shipper Date: 08/20/2021- 08/19/2022 Deductible: $500 ($500 met) OOP Max: $3,536.14 ($3,500 met) CIR: 80% coverage, 20% co-insurance SNF: 80% coverage, 20% co-insurance, Limited to 60 Days Per Calendar Year. Outpatient:  $30 copay/visit Home Health: 80% coverage, 20% co-insurance; Limited to 60 Visits Per Calendar Year. 1 visit equals up to 4 hours of skilled care services. DME: 80% coverage, 20% co-insurance; You can get 1 type of DME or orthotic (including repair/replacement) every 3 Years Providers: in network    SECONDARY:       Policy#:      Phone#:    Development worker, community:       Phone#:    The Engineer, petroleum" for patients in Inpatient Rehabilitation Facilities with attached "Privacy Act [place] Records" was provided and verbally reviewed with: Patient   Emergency Contact Information Contact Information       Name Relation Home Work Mobile    Agenda Son [phone]   [phone]    Giles,[name] Sister     [phone]           Current Medical History  Patient Admitting Diagnosis: ICH  History of Present Illness:[name] Giles is a 66 year old right-handed female with history of COPD/tobacco abuse.  Per chart review patient lives with her son.  1 level home 4 steps to enter.  Independent prior to admission working at a [employer].  Presented 03/29/2022 after being found down.  With headache as well as vomiting with altered  mental status.  CT showed a 5 x 4.5 x 4 cm intraparenchymal hemorrhage centered within the left anterior temporal and frontal lobes.  5 mm left to right midline shift as well as hydrocephalus.  CT angiogram head and neck no evidence of dural venous sinus thrombosis.  No large vessel occlusion.  Admission chemistries unremarkable, CK 61, troponin negative, ammonia level 20, urine drug screen negative.  Echocardiogram with ejection fraction of 60 to 65% no wall motion abnormalities.  Initially maintained on 3% hypertonic saline.  Patient did undergo placement of right frontal ventriculostomy via bur hole 03/29/2022 per Dr. Arnoldo Morale and removed 8/18.  Attempts at cerebral angiogram aborted due to primarily significant patient motion.  Latest follow-up cranial CT scan 04/12/2022 was stable noting decreased mass effect with decreasing edema around the hemorrhage.  She was cleared to begin subcutaneous heparin for DVT prophylaxis 04/01/2022.  Patient course delirium initially on Precedex and currently maintained on Seroquel.  Bouts of urinary retention placed on low-dose Urecholine.  Patient spiked a low-grade fever 102.5 with leukocytosis 18,000-15700 and monitored improved to 10,000.  Chest x-ray showed no signs of edema or pulmonary consolidation.  Urinalysis negative nitrite.  Blood cultures no growth to date.  Placed on empiric Rocephin as well as vancomycin.  Diet currently dysphagia  1 thin liquids.  Therapy evaluations completed due to patient decreased functional mobility was admitted for a comprehensive rehab program.     Complete NIHSS TOTAL: 5   Patient[facility]  has been reviewed by the rehabilitation admission coordinator and physician.   Past Medical History      Past Medical History:  Diagnosis Date   Asthma     Medical history non-contributory        Has the patient had major surgery during 100 days prior to admission? Yes   Family History   family  history includes Breast cancer in her mother; Diabetes in her brother; Hypertension in her brother and brother. She was adopted.   Current Medications   Current Facility-Administered Medications:    acetaminophen (TYLENOL) tablet 650 mg, 650 mg, Oral, Q4H PRN, 650 mg at 04/15/22 0011 **OR** acetaminophen (TYLENOL) 160 MG/5ML solution 650 mg, 650 mg, Per Tube, Q4H PRN **OR** acetaminophen (TYLENOL) suppository 650 mg, 650 mg, Rectal, Q4H PRN, Pokhrel, Laxman, MD   amLODipine (NORVASC) tablet 2.5 mg, 2.5 mg, Oral, Daily, Lonny Prude, Ralph A, MD, 2.5 mg at 04/16/22 0837   arformoterol (BROVANA) nebulizer solution 15 mcg, 15 mcg, Nebulization, BID, Agarwala, Ravi, MD, 15 mcg at 04/16/22 0817   bethanechol (URECHOLINE) tablet 10 mg, 10 mg, Oral, TID, Mariel Aloe, MD, 10 mg at 04/16/22 0837   budesonide (PULMICORT) nebulizer solution 0.25 mg, 0.25 mg, Nebulization, BID, Agarwala, Ravi, MD, 0.25 mg at 04/16/22 0817   cefTRIAXone (ROCEPHIN) 2 g in sodium chloride 0.9 % 100 mL IVPB, 2 g, Intravenous, Q12H, Pokhrel, Laxman, MD, Last Rate: 200 mL/hr at 04/16/22 0159, 2 g at 04/16/22 0159   feeding supplement (ENSURE ENLIVE / ENSURE PLUS) liquid 237 mL, 237 mL, Oral, BID BM, Mariel Aloe, MD, 237 mL at 04/16/22 0838   haloperidol lactate (HALDOL) injection 1 mg, 1 mg, Intravenous, Q30 min PRN, Agarwala, Ravi, MD, 1 mg at 04/09/22 2249   heparin injection 5,000 Units, 5,000 Units, Subcutaneous, Q8H, Garvin Fila, MD, 5,000 Units at 04/16/22 0615   hydrALAZINE (APRESOLINE) injection 20 mg, 20 mg, Intravenous, Q6H PRN, Bailey-Modzik, Delila A, NP, 20 mg at 04/02/22 2241   iohexol (OMNIPAQUE) 300 MG/ML solution 100 mL, 100 mL, Intra-arterial, Once PRN, [name], MD   ipratropium-albuterol (DUONEB) 0.5-2.5 (3) MG/3ML nebulizer solution 3 mL, 3 mL, Nebulization, Q6H PRN, Collene Gobble, MD, 3 mL at 04/03/22 1303   melatonin tablet 3 mg, 3 mg, Oral, QHS, Agarwala, Ravi, MD, 3 mg at 04/15/22 2102    multivitamin with minerals tablet 1 tablet, 1 tablet, Oral, Daily, Mariel Aloe, MD, 1 tablet at 04/16/22 0175   Oral care mouth rinse, 15 mL, Mouth Rinse, 4 times per day, Amie Portland, MD, 15 mL at 04/16/22 1025   Oral care mouth rinse, 15 mL, Mouth Rinse, PRN, Amie Portland, MD   oxyCODONE (Oxy IR/ROXICODONE) immediate release tablet 5 mg, 5 mg, Oral, Q6H PRN, Pokhrel, Laxman, MD   potassium chloride (KLOR-CON) packet 40 mEq, 40 mEq, Oral, BID, Reome, Earle J, RPH, 40 mEq at 04/16/22 0837   QUEtiapine (SEROQUEL) tablet 25 mg, 25 mg, Oral, QPM, Mariel Aloe, MD, 25 mg at 04/15/22 2102   revefenacin (YUPELRI) nebulizer solution 175 mcg, 175 mcg, Nebulization, Daily, Agarwala, Ravi, MD, 175 mcg at 04/16/22 0817   rosuvastatin (CRESTOR) tablet 20 mg, 20 mg, Oral, Daily, Rosalin Hawking, MD, 20 mg at 04/16/22 0837   senna-docusate (Senokot-S) tablet  1 tablet, 1 tablet, Oral, BID, Mariel Aloe, MD, 1 tablet at 04/15/22 2102   sodium chloride flush (NS) 0.9 % injection 10-40 mL, 10-40 mL, Intracatheter, Q12H, Newman Pies, MD, 10 mL at 04/16/22 0839   sodium chloride flush (NS) 0.9 % injection 10-40 mL, 10-40 mL, Intracatheter, PRN, Newman Pies, MD, 20 mL at 04/03/22 0935   vancomycin (VANCOCIN) IVPB 1000 mg/200 mL premix, 1,000 mg, Intravenous, Q12H, Ledford, James L, RPH   Patients Current Diet:  Diet Order                  DIET - DYS 1 Room service appropriate? No; Fluid consistency: Thin  Diet effective now                         Precautions / Restrictions Precautions Precautions: Fall Precaution Comments: posey belt Restrictions Weight Bearing Restrictions: No    Has the patient had 2 or more falls or a fall with injury in the past year? Yes   Prior Activity Level   Prior Functional Level Self Care: Did the patient need help bathing, dressing, using the toilet or eating? Independent   Indoor Mobility: Did the patient need assistance with walking from room to  room (with or without device)? Independent   Stairs: Did the patient need assistance with internal or external stairs (with or without device)? Independent   Functional Cognition: Did the patient need help planning regular tasks such as shopping or remembering to take medications? Independent   Patient Information Are you of Hispanic, Latino/a,or Spanish origin?: A. No, not of Hispanic, Latino/a, or Spanish origin What is your race?: A. White Do you need or want an interpreter to communicate with a doctor or health care staff?: 0. No   Patient's Response To:  Health Literacy and Transportation Is the patient able to respond to health literacy and transportation needs?: Yes Health Literacy - How often do you need to have someone help you when you read instructions, pamphlets, or other written material from your doctor or pharmacy?: Never In the past 12 months, has lack of transportation kept you from medical appointments or from getting medications?: No In the past 12 months, has lack of transportation kept you from meetings, work, or from getting things needed for daily living?: No   Development worker, international aid / [place] Devices/Equipment: None Home Equipment: None   Prior Device Use: Indicate devices/aids used by the patient prior to current illness, exacerbation or injury? None of the above   Current Functional Level Cognition   Arousal/Alertness: Lethargic Overall Cognitive Status: Difficult to assess Difficult to assess due to: Impaired communication (expressive difficulties) Current Attention Level: Focused Orientation Level: Disoriented X4 Following Commands: Follows one step commands inconsistently, Follows one step commands with increased time Safety/Judgement: Decreased awareness of safety, Decreased awareness of deficits General Comments: Lethargic initially but once sitting EOB and moving, became more awake. follows simple commands with repetition and increased  time, "what do you want me to do?" otherwise speech mostly incoherent Attention: Focused Focused Attention: Impaired Focused Attention Impairment: Verbal basic    Extremity Assessment (includes Sensation/Coordination)   Upper Extremity Assessment: Generalized weakness  Lower Extremity Assessment: Defer to PT evaluation     ADLs   Overall ADL's : Needs assistance/impaired Eating/Feeding: Minimal assistance, Set up, Sitting Eating/Feeding Details (indicate cue type and reason): attempted to assist patient with self feeding with patient resistant Grooming: Wash/dry hands, Wash/dry face, Brushing hair, Sitting,  Moderate assistance Grooming Details (indicate cue type and reason): patient able to wash face and hands with multiple verbal cues.  Mod assist to brush hair Upper Body Bathing: Minimal assistance Lower Body Bathing: Moderate assistance, Sit to/from stand Lower Body Bathing Details (indicate cue type and reason): for peri area cleaning Upper Body Dressing : Minimal assistance Upper Body Dressing Details (indicate cue type and reason): to change gown Lower Body Dressing: Moderate assistance Lower Body Dressing Details (indicate cue type and reason): to donn socks Toilet Transfer: Minimal assistance, Ambulation, Regular Toilet Toilet Transfer Details (indicate cue type and reason): verbal cues for safety and min assist for balance and walker use Toileting- Clothing Manipulation and Hygiene: Moderate assistance Toileting - Clothing Manipulation Details (indicate cue type and reason): nsg reports incontinenece of BM earlier this am Functional mobility during ADLs: Moderate assistance General ADL Comments: difficulty following directions     Mobility   Overal bed mobility: Needs Assistance Bed Mobility: Supine to Sit Sidelying to sit: Min assist, +2 for physical assistance Supine to sit: Min assist, HOB elevated Sit to supine: Min guard General bed mobility comments: ASsist with  trunk to get to EOB, increased time and cueing.     Transfers   Overall transfer level: Needs assistance Equipment used: Rolling walker (2 wheels) Transfers: Sit to/from Stand Sit to Stand: Min assist Bed to/from chair/wheelchair/BSC transfer type:: Step pivot Step pivot transfers: Min assist General transfer comment: Min A to power to standing, despite cues for hand placement, pt pulling up on RW. Stood from [employer], from chair x1. Transferred to chair post ambulation.     Ambulation / Gait / Stairs / Wheelchair Mobility   Ambulation/Gait Ambulation/Gait assistance: Mod assist, Min assist Gait Distance (Feet): 80 Feet (x2 bouts) Assistive device: Rolling walker (2 wheels) Gait Pattern/deviations: Decreased stride length, Narrow base of support, Step-through pattern, Trunk flexed General Gait Details: Slow, unsteady gait with forward flexed posture with cues for RW proximity/management as RW too far anterior, bil knee instability when fatigued. 1 seated rest break. Gait velocity: decreased Gait velocity interpretation: <1.31 ft/sec, indicative of household ambulator     Posture / Balance Dynamic Sitting Balance Sitting balance - Comments: fair sitting balance on EOB preparing to stand Balance Overall balance assessment: Needs assistance Sitting-balance support: Feet supported, No upper extremity supported Sitting balance-Leahy Scale: Fair Sitting balance - Comments: fair sitting balance on EOB preparing to stand Postural control: Posterior lean Standing balance support: During functional activity, Reliant on assistive device for balance Standing balance-Leahy Scale: Poor Standing balance comment: reliant on UE support     Special needs/care consideration Special service needs none     Previous Home Environment (from acute therapy documentation) Living Arrangements: Children (79 yo son; grandchild every other week)  Lives With: Son Available Help at Discharge: Family, Available  PRN/intermittently Type of Home: House Home Layout: One level Home Access: Stairs to enter Entrance Stairs-Rails: Right, Left Entrance Stairs-Number of Steps: 4 Bathroom Shower/Tub: Tub/shower unit (thresholds/small changes in height throughout the house) Biochemist, clinical: Standard Bathroom Accessibility: No Home Care Services: No Additional Comments: thresholds/small changes in height throughout the house   Discharge Living Setting Plans for Discharge Living Setting: Patient's home Type of Home at Discharge: House Discharge Home Layout: One level Discharge Home Access: Stairs to enter Entrance Stairs-Rails: Right, Left Entrance Stairs-Number of Steps: 4 Discharge Bathroom Shower/Tub: Tub/shower unit Discharge Bathroom Toilet: Standard Discharge Bathroom Accessibility: Yes How Accessible: Accessible via walker Does the patient have any problems  obtaining your medications?: No   Social/Family/Support Systems Patient Roles: Other (Comment) Contact Information: [phone] Anticipated Caregiver's Contact Information: Edwyna Ready (son) Rotating family members to assit Ability/Limitations of Caregiver: 24/7 Caregiver Availability: 24/7 Discharge Plan Discussed with Primary Caregiver: Yes Is Caregiver In Agreement with Plan?: Yes Does Caregiver/Family have Issues with Lodging/Transportation while Pt is in Rehab?: No   Goals Patient/Family Goal for Rehab: PT/OT/SLP supervision Expected length of stay: 10-12 days Pt/Family Agrees to Admission and willing to participate: Yes Program Orientation Provided & Reviewed with Pt/Caregiver Including Roles  & Responsibilities: Yes   Decrease burden of Care through IP rehab admission: n/a   Possible need for SNF placement upon discharge: not anticipated   Patient Condition: I have reviewed medical records from [facility] , spoken with CM, and patient, son, and family member. I met with patient at the bedside for  inpatient rehabilitation assessment.  Patient will benefit from ongoing PT, OT, and SLP, can actively participate in 3 hours of therapy a day 5 days of the week, and can make measurable gains during the admission.  Patient will also benefit from the coordinated team approach during an Inpatient Acute Rehabilitation admission.  The patient will receive intensive therapy as well as Rehabilitation physician, nursing, social worker, and care management interventions.  Due to bladder management, bowel management, safety, skin/wound care, disease management, medication administration, pain management, and patient education the patient requires 24 hour a day rehabilitation nursing.  The patient is currently min A  with mobility and basic ADLs.  Discharge setting and therapy post discharge at home with home health is anticipated.  Patient has agreed to participate in the Acute Inpatient Rehabilitation Program and will admit today.   Preadmission Screen Completed By:  Genella Mech, 04/16/2022 10:37 AM ______________________________________________________________________   Discussed status with Dr. Naaman Plummer  on 04/16/22 at 90 and received approval for admission today.   Admission Coordinator:  Genella Mech, CCC-SLP, time 04/11/22/Date 1115    Assessment/Plan: Diagnosis: ICH Does the need for close, 24 hr/day Medical supervision in concert with the patient's rehab needs make it unreasonable for this patient to be served in a less intensive setting? Yes Co-Morbidities requiring supervision/potential complications: fever, copd, dysphagia Due to bladder management, bowel management, safety, skin/wound care, disease management, medication administration, pain management, and patient education, does the patient require 24 hr/day rehab nursing? Yes Does the patient require coordinated care of a physician, rehab nurse, PT, OT, and SLP to address physical and functional deficits in the context of the above medical  diagnosis(es)? Yes Addressing deficits in the following areas: balance, endurance, locomotion, strength, transferring, bowel/bladder control, bathing, dressing, feeding, grooming, toileting, cognition, language, and psychosocial support Can the patient actively participate in an intensive therapy program of at least 3 hrs of therapy 5 days a week? Yes The potential for patient to make measurable gains while on inpatient rehab is excellent Anticipated functional outcomes upon discharge from inpatient rehab: supervision PT, supervision OT, mod assist and max assist SLP Estimated rehab length of stay to reach the above functional goals is: 10-12 days Anticipated discharge destination: Home 10. Overall Rehab/Functional Prognosis: excellent     MD Signature: Meredith Staggers, MD, [place] Director Rehabilitation Services 04/16/2022

## 2022-04-16 NOTE — Progress Notes (Signed)
Speech Language Pathology Treatment: Cognitive-Linguistic (aphasia)  Patient Details Name: [name] Giles MRN: [id number] DOB: Feb 09, 1956 Today's Date: 04/16/2022 Time: 3875-6433 SLP Time Calculation (min) (ACUTE ONLY): 15 min  Assessment / Plan / Recommendation Clinical Impression  Pt was seen for treatment. Pt's RN, [name] Giles, stated that the pt has been tolerating the current diet without signs of aspiration. Pt was more alert and verbally communicative than when she was last seen by this SLP on 8/24. However, communication continues to be more impaired than the week of 8/18. Pt's verbal output remains fluent with limited meaning. A single meaningful utterance was noted in response to a question when rephrasing was provided- SLP asked "would you like the TV turned on?" and pt responded, "Not really unless it's a good picture." Pt's response to simple yes/no questions was unreliable with perseveration on "yes" and only a single "no" response during the session. Pt refused all solids and liquids during the session by pushing boluses away and/or stating "no" or "no thanks". Pt's current diet will be continued and SLP will continue to follow pt.     HPI HPI: Pt is a 66 y/o female who presented with headache, N/V, and AMS. 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 Repeat head CT with increasing hemorrhage and associated hydrocephalus. Pt s/p ventriculostomy 8/10. MBS 8/18 WFL; regular texture diet and thin liquids recommended Repeat CT head 8/24 due to change in mentation: Expected evolution of left temporal lobe hemorrhage. Stable intraventricular hemorrhage, left greater than right, decreased mass effect with decreasing edema around the hemorrhage and decreasing midline shift. Urinalysis negative, CXR negative. [facility]. MBS 8/24 due to AMS: decline in swallow function since 8/18. oropharyngeal dysphagia characterized by reduced bolus  awareness, impaired mastication, and a pharyngeal delay. A single instance of trace aspiration noted with consecutive swallows of thin and oral holding noted; dysphagia 1 with thin recommended. PMH: COPD, tobacco abuse, asthma.      SLP Plan  Continue with current plan of care      Recommendations for follow up therapy are one component of a multi-disciplinary discharge planning process, led by the attending physician.  Recommendations may be updated based on patient status, additional functional criteria and insurance authorization.    Recommendations  Diet recommendations: Dysphagia 1 (puree);Thin liquid Liquids provided via: Cup;No straw Medication Administration: Crushed with puree Supervision: Full supervision/cueing for compensatory strategies Compensations: Minimize environmental distractions;Slow rate;Small sips/bites;Follow solids with liquid Postural Changes and/or Swallow Maneuvers: Seated upright 90 degrees                Oral Care Recommendations: Oral care BID Follow Up Recommendations: Skilled nursing-short term rehab (<3 hours/day) Assistance recommended at discharge: Frequent or constant Supervision/Assistance SLP Visit Diagnosis: Dysphagia, unspecified (R13.10) Plan: Continue with current plan of care        [name] Giles I. PHardin Giles MVan Alstyne CRuskOffice number [phone]   [name] Giles 04/16/2022, 10:14 AM

## 2022-04-16 NOTE — Discharge Summary (Signed)
Physician Discharge Summary   Patient: [name] Giles MRN: [id number] DOB: 02-08-1956  Admit date:     03/29/2022  Discharge date: 04/16/22  Discharge Physician: Flora Lipps   PCP: Redmond School, MD   Recommendations at discharge:   Follow-up with neurologist as outpatient after discharge from rehabilitation. Patient has been continued on Unasyn for possible pulmonary infection causing delirium.  Continue for 3 more days to complete 5-day course.  Continue aspiration precautions. Follow-up with neurosurgery as outpatient as there was suspicions for AVM and might need angiogram.  Discharge Diagnoses: Principal Problem:   Intracerebral hemorrhage ([place]) Active Problems:   Delirium   Essential hypertension   Hyperlipidemia   Cerebral AVM  Resolved Problems:   * No resolved hospital problems. *  Hospital Course: [name] Giles is a 66 y.o. female with past medical history of COPD, tobacco use and asthma presented to hospital after being found on the floor. 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 Patient was initially admitted to the ICU for management.  Patient underwent ventriculostomy secondary to hydrocephalus which has improved. PT/OT recommended acute rehabilitation.    Patient did have delirium during hospitalization and was noted to have a fever.  Neurology was reconsulted due to history of intraventricular hemorrhage and drain placement in the brain.  Neurology recommended antibiotic for possible lung infection.  After initiation of antibiotic, patient has been more alert awake but still confused and disoriented.  Plan is CIR when medically stable.  Assessment and Plan:  Temporal intracerebral hemorrhage As per neurology likely secondary to dural  AVF.  Patient initially admitted to ICU under stroke service and was noted to have large left temporal and frontal intracranial hemorrhage with intraventricular extension and left uncal  herniation and midline shift of 5 mm . MRI was then performed with similar findings in addition to increased midline shift to 7 mm. Repeat CT head (8/11) significant for increased midline shift to 10 mm. CTA head/neck (8/11) significant for AVF/AVM of left temporal lobe.  Patient underwent ventriculostomy during hospitalization.. LDL of 110, hemoglobin A1C of 5.7% and 2D Echocardiogram significant for LVEF of 60-65% with no atrial level shunt identified and no thrombus identified. PT/OT has recommended acute inpatient rehabilitation.  Repeat CT scan on 04/12/2022 of the head showing decreasing size of intracerebral hemorrhage.   Delirium Concern for aspiration but chest x-ray was negative.  She did have some cough and fever.  Neurology was consulted but unlikely meningeal infection was the impression.  Was initially on vancomycin and Rocephin.  We will change to Unasyn on discharge to rehabilitation to complete 5-day course.  Cultures have been negative so far.  Patient does have low-grade fever.  Leukocytosis has normalized to 10.0.Marland Kitchen   Cerebral edema/Obstructive hydrocephalus Neurosurgery was consulted, patient was initially put on 3% hypertonic saline from 8/10 /23 until 03/31/22. Right frontal ventriculostomy was placed on 03/29/22 by neurosurgery and was removed on 04/06/22.  CT scan done for delirium on 8/24 did not show any hydrocephalus and expected evolution of left temporal lobe hemorrhage..   Left temporal AVF/AVM Likely source of hemorrhage. Neurosurgery recommending angiogram which will need to be performed when medically stable and not confused.   Hypertension Previous history of hypertension.  Was initially on Cleviprex drip.  On low-dose amlodipine 2.5 mg daily at this time.  Blood pressure has improved at this time and overall goal should be normotensive in the    Hyperlipidemia LDL of 119.   on Crestor  Dysphagia Dysphagia 1 diet at this time.  Could advance as tolerated as mentation  improves.  Would recommend aspiration precautions.   Tobacco use Not on nicotine replacement at this time.   Hypokalemia Improved with replacement.  Latest potassium prior to discharge was 3.8   Hypernatremia Improved at this time.    Prediabetes Hemoglobin A1C calculated to be 5.7%.  Closely monitor.   COPD Supposed to be on Symbicort and albuterol.  Will resume on discharge.  Consultants:  Neurology/Stroke Neurosurgery PCCM  Procedures performed:  Ventriculostomy NG tube placement and removal   Disposition: Rehabilitation facility.  Communicated with the rehab team who is aware of the patient's mental condition/low-grade fever.  Communicated with the patient's son prior to discharge as well.  Diet recommendation:  Dysphagia type 1 thin Liquid  DISCHARGE MEDICATION: Allergies as of 04/16/2022   No Known Allergies      Medication List     STOP taking these medications    ibuprofen 600 MG tablet Commonly known as: ADVIL       TAKE these medications    acetaminophen 325 MG tablet Commonly known as: TYLENOL Take 2 tablets (650 mg total) by mouth every 4 (four) hours as needed for mild pain or fever (temp >100.5).   albuterol 108 (90 Base) MCG/ACT inhaler Commonly known as: VENTOLIN HFA Inhale 1-2 puffs into the lungs every 4 (four) hours as needed for shortness of breath. What changed: Another medication with the same name was removed. Continue taking this medication, and follow the directions you see here.   amLODipine 2.5 MG tablet Commonly known as: NORVASC Take 1 tablet (2.5 mg total) by mouth daily. Start taking on: April 17, 2022   ampicillin-sulbactam 1.5 (1-0.5) g Solr injection Commonly known as: UNASYN 1.5GM Inject 1.5 g into the vein every 6 (six) hours for 3 days.   feeding supplement Liqd Take 237 mLs by mouth 2 (two) times daily between meals.   fluticasone 50 MCG/ACT nasal spray Commonly known as: FLONASE Place 2 sprays into both  nostrils daily.   melatonin 3 MG Tabs tablet Take 1 tablet (3 mg total) by mouth at bedtime.   multivitamin with minerals Tabs tablet Take 1 tablet by mouth daily. Start taking on: April 17, 2022   oxyCODONE 5 MG immediate release tablet Commonly known as: Oxy IR/ROXICODONE Take 1 tablet (5 mg total) by mouth every 6 (six) hours as needed for severe pain or breakthrough pain.   QUEtiapine 25 MG tablet Commonly known as: SEROQUEL Take 1 tablet (25 mg total) by mouth every evening.   rosuvastatin 20 MG tablet Commonly known as: CRESTOR Take 1 tablet (20 mg total) by mouth daily. Start taking on: April 17, 2022   senna-docusate 8.6-50 MG tablet Commonly known as: Senokot-S Take 1 tablet by mouth 2 (two) times daily.   Symbicort 160-4.5 MCG/ACT inhaler Generic drug: budesonide-formoterol Inhale 2 puffs into the lungs daily.        Follow-up Information     [facility]. Schedule an appointment as soon as possible for a visit in 1 month(s).   Specialty: Neurology Why: stroke clinic Contact information: [address] [place] [phone]               Subjective. Today, patient was seen and examined at bedside.  Alert awake and communicative but disoriented and confused.  Following some commands but mostly incoherent.  Discharge Exam: Filed Weights   03/29/22 2000  Weight: 66.2  kg      04/16/2022    8:21 AM 04/16/2022    4:04 AM 04/16/2022   12:13 AM  Vitals with BMI  Systolic 353 614 97  Diastolic 48 54 81  Pulse 88 80 97   Body mass index is 24.29 kg/m.  General:  Average built, not in obvious distress, alert awake and communicative but disoriented and confused. HENT:   No scleral pallor or icterus noted. Oral mucosa is moist.  Staples in the scalp. Chest:  Clear breath sounds.  Diminished breath sounds bilaterally. No crackles or wheezes.  CVS: S1 &S2 heard. No murmur.  Regular rate and  rhythm. Abdomen: Soft, nontender, nondistended.  Bowel sounds are heard.   Extremities: No cyanosis, clubbing or edema.  Peripheral pulses are palpable. Psych: Alert, awake and communicative but disoriented and confused, follows few commands CNS: Moves all extremities, confused disoriented, Skin: Warm and dry.  No rashes noted.   Condition at discharge: stable  The results of significant diagnostics from this hospitalization (including imaging, microbiology, ancillary and laboratory) are listed below for reference.   Imaging Studies: DG Swallowing Func-Speech Pathology  Result Date: 04/12/2022 Table formatting from the original result was not included. Objective Swallowing Evaluation: Type of Study: MBS-Modified Barium Swallow Study  Patient Details Name: [name] Giles MRN: [id number] Date of Birth: Apr 08, 1956 Today's Date: 04/12/2022 Time: SLP Start Time (ACUTE ONLY): 1455 -SLP Stop Time (ACUTE ONLY): 1515 SLP Time Calculation (min) (ACUTE ONLY): 20 min Past Medical History: Past Medical History: Diagnosis Date  Asthma   Medical history non-contributory  Past Surgical History: Past Surgical History: Procedure Laterality Date  COLONOSCOPY    COLONOSCOPY N/A 04/06/2013  Procedure: COLONOSCOPY;  Surgeon: Daneil Dolin, MD;  [facility];  Service: Endoscopy;  Laterality: N/A;  9:30 AM  ENDOMETRIAL ABLATION    IR ANGIO INTRA EXTRACRAN SEL INTERNAL CAROTID BILAT MOD SED  04/04/2022  IR US GUIDE VASC ACCESS RIGHT  04/04/2022 HPI: Pt is a 66 y/o female who presented with headache, N/V, and AMS. CTH revealed a large L frontotemporal ICH with IVH and 63m midline shift. Repeat head CT with increasing hemorrhage and associated hydrocephalus. Pt s/p ventriculostomy 8/10. MBS 8/18 WFL; regular texture diet and thin liquids recommended Repeat CT head 8/24 due to change in mentation: Expected evolution of left temporal lobe hemorrhage. Stable intraventricular hemorrhage, left greater than right, decreased  mass effect with decreasing edema around the hemorrhage and decreasing midline shift. Urinalysis negative, CXR negative.PMH: COPD, tobacco abuse, asthma.  No data recorded  Recommendations for follow up therapy are one component of a multi-disciplinary discharge planning process, led by the attending physician.  Recommendations may be updated based on patient status, additional functional criteria and insurance authorization. Assessment / Plan / Recommendation   04/12/2022   2:55 PM Clinical Impressions Clinical Impression Pt has demonstrated a decline in swallow function since 8/18. Pt exhibited oropharyngeal dysphagia characterized by reduced bolus awareness, impaired mastication, and a pharyngeal delay. She demonstrated intermittent oral holding, and prolonged mastication which necessitated cues for initiation and swallowing. The swallow was often triggered with solid boluses at the level of the valleculae or pyriforms and cues were intermittently necessary for pt to swallow solids. Trace aspiration (PAS 7) was noted once with consecutive swallows of thin liquids via straw secondary to the pharyngeal delay and pt's independent coughing expelled the aspirate. However, aspiration could not be replicated despite provision of additional boluses of thin liquids via straw. Pt consistently spat out  the pill when it was given in puree and with thin liquids. Coughing continues to be noted in the absence of aspiration as well so SLP suspects that all coughing at bedside should not be attributed to aspiration especially if pt's mentation is at baseline. Considering pt's now impaired bolus awareness and mastication, a dysphagia 1 diet with thin liquids will be initiated with strict observance of swallowing precautions. SLP will continue to follow pt. SLP Visit Diagnosis Dysphagia, oropharyngeal phase (R13.12) Impact on safety and function Mild aspiration risk     04/12/2022   2:55 PM Treatment Recommendations Treatment  Recommendations Therapy as outlined in treatment plan below     04/12/2022   2:55 PM Prognosis Prognosis for Safe Diet Advancement Good Barriers to Reach Goals Language deficits;Cognitive deficits   04/12/2022   2:55 PM Diet Recommendations SLP Diet Recommendations Thin liquid;Dysphagia 1 (Puree) solids Liquid Administration via Cup;No straw Medication Administration Crushed with puree Compensations Minimize environmental distractions;Slow rate;Small sips/bites;Follow solids with liquid Postural Changes Remain semi-upright after after feeds/meals (Comment);Seated upright at 90 degrees     04/12/2022   2:55 PM Other Recommendations Oral Care Recommendations Oral care BID Follow Up Recommendations Acute inpatient rehab (3hours/day) Assistance recommended at discharge Frequent or constant Supervision/Assistance Functional Status Assessment Patient has had a recent decline in their functional status and demonstrates the ability to make significant improvements in function in a reasonable and predictable amount of time.   04/12/2022   2:55 PM Frequency and Duration  Speech Therapy Frequency (ACUTE ONLY) min 2x/week Treatment Duration 2 weeks     04/12/2022   2:55 PM Oral Phase Oral Phase Impaired Oral - Nectar Cup Delayed oral transit;Holding of bolus Oral - Nectar Straw Delayed oral transit;Holding of bolus Oral - Thin Cup Delayed oral transit;Holding of bolus Oral - Thin Straw Delayed oral transit;Holding of bolus Oral - Puree Delayed oral transit;Holding of bolus Oral - Regular Delayed oral transit;Holding of bolus;Impaired mastication Oral - Pill Delayed oral transit;Holding of bolus    04/12/2022   2:55 PM Pharyngeal Phase Pharyngeal Phase Impaired Pharyngeal- Nectar Cup Delayed swallow initiation-pyriform sinuses;Delayed swallow initiation-vallecula Pharyngeal- Nectar Straw Delayed swallow initiation-pyriform sinuses;Delayed swallow initiation-vallecula Pharyngeal- Thin Cup Delayed swallow initiation-pyriform  sinuses;Delayed swallow initiation-vallecula Pharyngeal- Thin Straw Delayed swallow initiation-pyriform sinuses;Delayed swallow initiation-vallecula Pharyngeal- Puree Delayed swallow initiation-vallecula Pharyngeal- Regular Delayed swallow initiation-vallecula Pharyngeal- Pill NT    04/12/2022   2:55 PM Cervical Esophageal Phase  Cervical Esophageal Phase [facility] Shanika I. Hardin Negus, [place], [place] Office number [phone] Horton Marshall 04/12/2022, 3:56 PM                     CT HEAD WO CONTRAST (5MM)  Result Date: 04/12/2022 CLINICAL DATA:  Intraventricular hemorrhage and drain. EXAM: CT HEAD WITHOUT CONTRAST TECHNIQUE: Contiguous axial images were obtained from the base of the skull through the vertex without intravenous contrast. RADIATION DOSE REDUCTION: This exam was performed according to the departmental dose-optimization program which includes automated exposure control, adjustment of the mA and/or kV according to patient size and/or use of iterative reconstruction technique. COMPARISON:  CT head without contrast 04/06/2022 and 03/31/2022. FINDINGS: Brain: The right-sided ventricular catheter scratched at the right-sided ventriculostomy catheter has been removed. Expected evolution of a left temporal lobe hemorrhage noted. The borders are now less distinct. Surrounding edema is slightly less prominent. Mass effect remains with partial effacement of the left lateral ventricle and midline shift. Midline shift now measures 7.5 mm. Stable intraventricular hemorrhage is  present, left greater than right. The right frontal ventriculostomy catheter has been removed. Minimal residual edema is noted along the tract. No new hemorrhage or progressive mass effect is present. 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 No acute cortical abnormality is present.  Basal ganglia are intact. The brainstem and cerebellum are within normal limits.  Vascular: Atherosclerotic calcifications are present within the cavernous internal carotid arteries bilaterally. No hyperdense vessel is present. Skull: Right frontal burr hole noted. Calvarium is otherwise intact. No focal lytic or blastic lesions are present. Skin staples are present in the right frontal scalp with a ventriculostomy catheter was. Sinuses/Orbits: The paranasal sinuses and mastoid air cells are clear. The globes and orbits are within normal limits. IMPRESSION: 1. Expected evolution of left temporal lobe hemorrhage. 2. Stable intraventricular hemorrhage, left greater than right. 3. Removal of right frontal ventriculostomy catheter. 4. 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 5. Decreased mass effect with decreasing edema around the hemorrhage and decreasing midline shift. No new hemorrhage. Electronically Signed   By: San Morelle M.D.   On: 04/12/2022 14:06   EEG adult  Result Date: 04/12/2022 Lora Havens, MD     04/13/2022  8:18 AM Patient Name: [name] Giles MRN: [id number] Epilepsy Attending: Lora Havens Referring Physician/Provider: Rosalin Hawking, MD Date: 04/12/2022 Duration: 23.50 mins Patient history: 66 year old with left temporal hemorrhage and altered mental status.  EEG to evaluate for seizure. Level of alertness: lethargic, asleep AEDs during EEG study: None Technical aspects: This EEG study was done with scalp electrodes positioned according to the 10-20 International system of electrode placement. Electrical activity was reviewed with band pass filter of 1-'70Hz'$ , sensitivity of 7 uV/mm, display speed of 62m/sec with a '60Hz'$  notched filter applied as appropriate. EEG data were recorded continuously and digitally stored.  Video monitoring was available and reviewed as appropriate. Description: During awake state, no clear posterior dominant rhythm was seen.  EEG showed continuous generalized predominantly 5 to  7 Hz theta slowing admixed with intermittent generalized high amplitude polymorphic sharply contoured 3 to 6 Hz theta-delta slowing.  Sleep was characterized by sleep spindles (12 to 40 Hz), maximal frontocentral region. Hyperventilation and photic stimulation were not performed.   ABNORMALITY - Continuous slow, generalized IMPRESSION: This study is suggestive of moderate diffuse encephalopathy, nonspecific etiology. No seizures or epileptiform discharges were seen throughout the recording. If suspicion for interictal activity remains a concern, a prolonged study can be considered. Priyanka OBarbra Sarks  DG CHEST PORT 1 VIEW  Result Date: 04/12/2022 CLINICAL DATA:  Fever EXAM: PORTABLE CHEST 1 VIEW COMPARISON:  Previous studies including the examination of 04/07/2022 FINDINGS: Cardiac size is within normal limits. Lung fields are clear of any pulmonary edema or new focal infiltrates. Small linear densities in left lower lung field may suggest minimal subsegmental atelectasis. There is interval removal of enteric tube. There is no pleural effusion or pneumothorax. IMPRESSION: There are no signs of pulmonary edema or focal pulmonary consolidation. Electronically Signed   By: PElmer PickerM.D.   On: 04/12/2022 08:21   DG CHEST PORT 1 VIEW  Result Date: 04/07/2022 CLINICAL DATA:  Fever. EXAM: PORTABLE CHEST 1 VIEW COMPARISON:  03/29/2022. FINDINGS: Cardiac silhouette is normal in size. Normal mediastinal and hilar contours. Nasogastric tube passes below the diaphragm. Lungs hyperexpanded, but clear. No pleural effusion or pneumothorax. Skeletal structures are grossly intact. IMPRESSION: No active  disease. Electronically Signed   By: Lajean Manes M.D.   On: 04/07/2022 10:18   DG Swallowing Func-Speech Pathology  Result Date: 04/06/2022 Table formatting from the original result was not included. Objective Swallowing Evaluation: Type of Study: MBS-Modified Barium Swallow Study  Patient Details Name: DAMETRA WHETSEL MRN: [id number] Date of Birth: 08-15-1956 Today's Date: 04/06/2022 Time: SLP Start Time (ACUTE ONLY): 1336 -SLP Stop Time (ACUTE ONLY): 1352 SLP Time Calculation (min) (ACUTE ONLY): 16 min Past Medical History: Past Medical History: Diagnosis Date  Asthma   Medical history non-contributory  Past Surgical History: Past Surgical History: Procedure Laterality Date  COLONOSCOPY    COLONOSCOPY N/A 04/06/2013  Procedure: COLONOSCOPY;  Surgeon: Daneil Dolin, MD;  [facility];  Service: Endoscopy;  Laterality: N/A;  9:30 AM  ENDOMETRIAL ABLATION    IR ANGIO INTRA EXTRACRAN SEL INTERNAL CAROTID BILAT MOD SED  04/04/2022  IR US GUIDE VASC ACCESS RIGHT  04/04/2022 HPI: Pt is a 65 y/o female who presented with headache, N/V, and AMS. CTH revealed a large L frontotemporal ICH with IVH and 39m midline shift. Repeat head CT with increasing hemorrhage and associated hydrocephalus. Pt s/p ventriculostomy 8/10. PMH: COPD, tobacco abuse, asthma.  No data recorded  Recommendations for follow up therapy are one component of a multi-disciplinary discharge planning process, led by the attending physician.  Recommendations may be updated based on patient status, additional functional criteria and insurance authorization. Assessment / Plan / Recommendation   04/06/2022   2:33 PM Clinical Impressions Clinical Impression Pt's oropharyngeal swallow mechanism is within functional limits. Decreased bolus cohesion was noted once when pt filled her oral cavity with 4oz of thin liquids; premature spillage was noted thereafter with transient penetration (PAS 2). However this depth of laryngeal invasion is considered to be WNL and this was not replicated with smaller boluses. Coughing was observed during the study as was noted at bedside; however, no instances of dysfunctional penetration or aspiration were demonstrated at these times and coughing is therefore believed to be unrelated to swallowing. Pt was able to swallow a 173m barium tablet with thin liquids. Additional boluses of thin liquids were necesssary to facilitate trasport through the cervical and upper thoracic esophagus, but SLP suspects that the presence of the Cortrak impacted this. A regular texture diet with thin liquids is recommended at this time and SLP will follow briefly for swallowing to ensure tolerance. SLP Visit Diagnosis Dysphagia, unspecified (R13.10) Impact on safety and function No limitations     04/06/2022   2:33 PM Treatment Recommendations Treatment Recommendations Therapy as outlined in treatment plan below     04/06/2022   2:33 PM Prognosis Prognosis for Safe Diet Advancement Good Barriers to Reach Goals Language deficits;Cognitive deficits   04/06/2022   2:33 PM Diet Recommendations SLP Diet Recommendations Regular solids;Thin liquid Liquid Administration via Cup;Straw Medication Administration Whole meds with liquid Compensations Minimize environmental distractions;Slow rate Postural Changes Seated upright at 90 degrees     04/06/2022   2:33 PM Other Recommendations Oral Care Recommendations Oral care BID Follow Up Recommendations Acute inpatient rehab (3hours/day) Assistance recommended at discharge Frequent or constant Supervision/Assistance Functional Status Assessment Patient has had a recent decline in their functional status and demonstrates the ability to make significant improvements in function in a reasonable and predictable amount of time.   04/06/2022   2:33 PM Frequency and Duration  Speech Therapy Frequency (ACUTE ONLY) min 2x/week Treatment Duration 2 weeks     04/06/2022  2:33 PM Oral Phase Oral Phase [facility]    04/06/2022   2:33 PM Pharyngeal Phase Pharyngeal Phase [facility]    04/06/2022   2:33 PM Cervical Esophageal Phase  Cervical Esophageal Phase [facility] Shanika I. Hardin Negus, [place], [place] Office number [phone] Horton Marshall 04/06/2022, 2:48 PM                     CT HEAD WO CONTRAST (5MM)  Result Date:  04/06/2022 CLINICAL DATA:  66 year old female presenting with left temporal lobe hemorrhage, IVH and EVD. Subsequent encounter. EXAM: CT HEAD WITHOUT CONTRAST TECHNIQUE: Contiguous axial images were obtained from the base of the skull through the vertex without intravenous contrast. RADIATION DOSE REDUCTION: This exam was performed according to the departmental dose-optimization program which includes automated exposure control, adjustment of the mA and/or kV according to patient size and/or use of iterative reconstruction technique. COMPARISON:  03/31/2022 head CT and earlier. FINDINGS: Brain: Ongoing hyperdense intra-axial hemorrhage in the left hemisphere throughout the temporal lobe. Hematoma size now 66 mm x 47 mm (including mesial temporal lobe blood) by 51 mm (AP by transverse by CC) estimated blood products now up to 79 mL, mildly progressed from prior exams. Ongoing regional edema, with perhaps greater extension into the posterior deep white matter capsules now on series 3, image 16. Rightward midline shift of 10 mm is 1 mm greater since 03/31/2022. Basilar cisterns remain patent. Stable right frontal approach EVD which terminates at the septum pellucidum and does communicate with the right lateral ventricle on coronal image 29. Moderate volume intraventricular hemorrhage mostly in the left lateral ventricle now. Clearance of blood from the 3rd and 4th ventricles. No other extra-axial extension of blood. No cortically based acute infarct identified. Vascular: Calcified atherosclerosis at the skull base. Skull: Stable right frontal burr hole. No acute osseous abnormality identified. Sinuses/Orbits: Left nasoenteric tube remains in place. Stable sinus and mastoid aeration. Other: Minimal postsurgical changes to the superior right scalp. Mild rightward gaze but otherwise orbits appear negative. IMPRESSION: 1. Large left temporal lobe intra-axial hematoma has not regressed since 03/31/2022 and now appears  slightly larger. Regional edema and rightward midline shift not significantly changed. Basilar cisterns remain patent. 2. Stable right frontal EVD and moderate volume IVH in the left lateral ventricle. Blood has cleared the 3rd and 4th ventricles since 03/31/2022. 3. No new intracranial abnormality. Electronically Signed   By: Genevie Ann M.D.   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 She therefore presents for diagnostic cerebral angiogram. ACCESS: The technical aspects of the procedure as well as its potential risks and benefits were reviewed with the patient's family. These risks included but were not limited bleeding, infection, allergic reaction, damage to organs or vital structures, stroke, non-diagnostic procedure, and the catastrophic outcomes of heart attack, coma, and death. With an understanding of these risks, informed consent was obtained and witnessed. The patient was placed in the supine position on the angiography table and the skin of right groin prepped in the usual sterile fashion. Multiple attempts were made to access the right common femoral artery. Ultimately, ultrasound guidance was used to introduce the micro puncture needle into the more proximal right common femoral artery and advanced the cope Mandril wire,  however fluoroscopic image revealed puncture site likely proximal to the right inguinal ligament. Patient was also noted to be moving and not able to follow simple verbal commands. Because of her motion, I felt it highly unlikely that we would be able to obtain meaningful angiographic images. I was also concerned about hematoma if we did place a sheath in the femoral artery. I therefore elected to abort the  procedure at this time. The micro puncture needle was therefore removed and hemostasis was secured with manual compression. MEDICATIONS: HEPARIN: 0 Units total. CONTRAST:  cc, Omnipaque 300 FLUOROSCOPY TIME:  FLUOROSCOPY TIME: See IR records TECHNIQUE: CATHETERS AND WIRES 5-French JB-1 catheter 0.035" glidewire FINDINGS: None (procedure aborted) DISPOSITION: The patient was returned to the neuro intensive care unit for further care. IMPRESSION: 1. Aborted attempt at diagnostic cerebral angiogram due primarily to significant patient motion. 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 She therefore presents for diagnostic cerebral angiogram. ACCESS: The technical aspects of the procedure as well as its potential risks and benefits were reviewed with the patient's family. These risks included but were not limited bleeding, infection, allergic reaction, damage to organs or vital structures, stroke, non-diagnostic procedure, and the catastrophic outcomes of heart attack, coma, and death. With an understanding of these risks, informed consent was obtained and witnessed. The patient was placed in the supine position on the angiography table and the skin of right groin prepped in the usual sterile fashion. Multiple attempts were made to access the right common femoral artery. Ultimately, ultrasound guidance was used to introduce the micro puncture needle into the more proximal right common femoral artery and advanced the cope Mandril wire, however fluoroscopic image revealed puncture site likely proximal to the right inguinal ligament. Patient was also noted to be moving and not able to follow simple verbal commands. Because of her  motion, I felt it highly unlikely that we would be able to obtain meaningful angiographic images. I was also concerned about hematoma if we did place a sheath in the femoral artery. I therefore elected to abort the procedure at this time. The micro puncture needle was therefore removed and hemostasis was secured with manual compression. MEDICATIONS: HEPARIN: 0 Units total. CONTRAST:  cc, Omnipaque 300 FLUOROSCOPY TIME:  FLUOROSCOPY TIME: See IR records TECHNIQUE: CATHETERS AND WIRES 5-French JB-1 catheter 0.035" glidewire FINDINGS: None (procedure aborted) DISPOSITION: The patient was returned to the neuro intensive care unit for further care. IMPRESSION: 1. Aborted attempt at diagnostic cerebral angiogram due primarily to significant patient motion. Electronically Signed   By: [name]   On: 04/05/2022 18:08   DG Abd Portable 1V  Result Date: 04/04/2022 CLINICAL DATA:  Nasogastric tube placement EXAM: PORTABLE ABDOMEN - 1 VIEW COMPARISON:  March 30, 2022 FINDINGS: The bowel gas pattern is normal. No radio-opaque calculi or other significant radiographic abnormality are seen. The distal tip of the nasogastric tube projects over the stomach. Partially visualized central venous catheter, terminating near the superior cavoatrial junction. The visualized bibasilar lungs are clear. IMPRESSION: The distal tip of the nasogastric tube is in the stomach. Electronically Signed   By: Beryle Flock M.D.   On: 04/04/2022 10:21   CT HEAD WO CONTRAST (5MM)  Result Date:  03/31/2022 CLINICAL DATA:  66 year old female presenting with left temporal lobe hemorrhage, IVH and EVD. 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 Subsequent encounter. EXAM: CT HEAD WITHOUT CONTRAST TECHNIQUE: Contiguous axial images were obtained from the base of the skull through the vertex without intravenous contrast. RADIATION DOSE REDUCTION: This exam was performed according to the departmental dose-optimization program which  includes automated exposure control, adjustment of the mA and/or kV according to patient size and/or use of iterative reconstruction technique. COMPARISON:  CTA head and neck, brain MRI 03/30/2022 and earlier. FINDINGS: Brain: Large intra-axial hemorrhage in the left temporal lobe (68 mL volume estimation) with surrounding edema has not significantly changed in size or configuration from yesterday. Moderate volume intraventricular hemorrhage and mild to moderate enlargement of the left lateral ventricle appears stable. Stable right frontal approach external ventricular drain terminating near the septum pellucidum. Stable ventricle size and configuration. Stable rightward midline shift of 9 mm at the anterior septum pellucidum. Basilar cisterns remain patent. No other extra-axial extension of blood identified. Stable gray-white matter differentiation throughout the brain. Vascular: Calcified atherosclerosis at the skull base. Skull: Stable right frontal burr hole. No acute osseous abnormality identified. Sinuses/Orbits: Visualized paranasal sinuses and mastoids are stable and well aerated. Other: Left nasoenteric tube now in place. Right frontal approach EVD remains. No acute orbit or scalp soft tissue finding. IMPRESSION: 1. Stable large left temporal lobe hemorrhage and edema since yesterday. Stable intraventricular hemorrhage and EVD along with mild to moderate enlargement of the left temporal lobe. 2. Stable intracranial mass effect with up to 9 mm rightward midline shift. Basilar cisterns remain patent. 3. No new intracranial abnormality. Electronically Signed   By: Genevie Ann M.D.   On: 03/31/2022 09:40   [place] EKG SITE RITE  Result Date: 03/30/2022 If Site Rite image not attached, placement could not be confirmed due to current cardiac rhythm.  DG Abd Portable 1V  Result Date: 03/30/2022 CLINICAL DATA:  Nasogastric tube placement. EXAM: PORTABLE ABDOMEN - 1 VIEW COMPARISON:  None Available. FINDINGS: The  bowel gas pattern is normal. Distal tip of feeding tube is seen in expected position of distal stomach. No radio-opaque calculi or other significant radiographic abnormality are seen. IMPRESSION: Distal tip of feeding tube seen in expected position of distal stomach. Electronically Signed   By: Marijo Conception M.D.   On: 03/30/2022 12:20   [place] EKG SITE RITE  Result Date: 03/30/2022 If Site Rite image not attached, placement could not be confirmed due to current cardiac rhythm.  ECHOCARDIOGRAM COMPLETE  Result Date: 03/30/2022    ECHOCARDIOGRAM REPORT   Patient Name:   MELITTA TIGUE Date of Exam: 03/30/2022 Medical Rec #:  [id number]        Height:       65.0 in Accession #:    [id number]       Weight:       145.9 lb Date of Birth:  [birth year]        BSA:          1.730 m Patient Age:    43 years         BP:           150/66 mmHg Patient Gender: F                HR:           62 bpm. Exam Location:  Inpatient Procedure: 2D Echo, Cardiac Doppler and Color Doppler Indications:    Stroke  History:  Patient has no prior history of Echocardiogram examinations.  Sonographer:    Merrie Roof RDCS Referring Phys: [id number] ASHISH ARORA IMPRESSIONS  1. Left ventricular ejection fraction, by estimation, is 60 to 65%. The left ventricle has normal function. The left ventricle has no regional wall motion abnormalities. Left ventricular diastolic parameters are indeterminate. Elevated left ventricular end-diastolic pressure.  2. Right ventricular systolic function is normal. The right ventricular size is normal. Tricuspid regurgitation signal is inadequate for assessing PA pressure.  3. The mitral valve is degenerative. Mild mitral valve regurgitation. No evidence of mitral stenosis. Moderate mitral annular calcification.  4. The aortic valve is normal in structure. Aortic valve regurgitation is not visualized. No aortic stenosis is present.  5. The inferior vena cava is dilated in size with >50% respiratory  variability, suggesting right atrial pressure of 8 mmHg. Conclusion(s)/Recommendation(s): No intracardiac source of embolism detected on this transthoracic study. Consider a transesophageal echocardiogram to exclude cardiac source of embolism if clinically indicated. FINDINGS  Left Ventricle: Left ventricular ejection fraction, by estimation, is 60 to 65%. The left ventricle has normal function. The left ventricle has no regional wall motion abnormalities. The left ventricular internal cavity size was normal in size. There is  no left ventricular hypertrophy. Left ventricular diastolic parameters are indeterminate. Elevated left ventricular end-diastolic pressure. Right Ventricle: The right ventricular size is normal. No increase in right ventricular wall thickness. Right ventricular systolic function is normal. Tricuspid regurgitation signal is inadequate for assessing PA pressure. Left Atrium: Left atrial size was normal in size. Right Atrium: Right atrial size was normal in size. Pericardium: There is no evidence of pericardial effusion. Mitral Valve: The mitral valve is degenerative in appearance. There is moderate thickening of the mitral valve leaflet(s). There is mild calcification of the mitral valve leaflet(s). Moderate mitral annular calcification. Mild mitral valve regurgitation.  No evidence of mitral valve stenosis. Tricuspid Valve: The tricuspid valve is normal in structure. Tricuspid valve regurgitation is not demonstrated. No evidence of tricuspid stenosis. Aortic Valve: The aortic valve is normal in structure. Aortic valve regurgitation is not visualized. No aortic stenosis is present. Pulmonic Valve: The pulmonic valve was normal in structure. Pulmonic valve regurgitation is not visualized. No evidence of pulmonic stenosis. Aorta: The aortic root is normal in size and structure. Venous: The inferior vena cava is dilated in size with greater than 50% respiratory variability, suggesting right atrial  pressure of 8 mmHg. IAS/Shunts: No atrial level shunt detected by color flow Doppler.  LEFT VENTRICLE PLAX 2D LVIDd:         3.90 cm   Diastology LVIDs:         3.00 cm   LV e' medial:    6.42 cm/s LV PW:         1.00 cm   LV E/e' medial:  17.4 LV IVS:        0.90 cm   LV e' lateral:   6.64 cm/s LVOT diam:     1.80 cm   LV E/e' lateral: 16.9 LV SV:         47 LV SV Index:   27 LVOT Area:     2.54 cm  RIGHT VENTRICLE          IVC RV Basal diam:  2.50 cm  IVC diam: 2.20 cm LEFT ATRIUM             Index        RIGHT ATRIUM  Index LA diam:        2.90 cm 1.68 cm/m   RA Area:     11.30 cm LA Vol (A2C):   46.9 ml 27.11 ml/m  RA Volume:   29.00 ml  16.76 ml/m LA Vol (A4C):   32.4 ml 18.73 ml/m LA Biplane Vol: 43.4 ml 25.08 ml/m  AORTIC VALVE LVOT Vmax:   80.90 cm/s LVOT Vmean:  52.100 cm/s LVOT VTI:    0.185 m MITRAL VALVE MV Area (PHT): 3.77 cm     SHUNTS MV Decel Time: 201 msec     Systemic VTI:  0.18 m MV E velocity: 112.00 cm/s  Systemic Diam: 1.80 cm MV A velocity: 111.00 cm/s MV E/A ratio:  1.01 Fransico Him MD Electronically signed by Fransico Him MD Signature Date/Time: 03/30/2022/10:27:00 AM    Final    CT ANGIO HEAD NECK W WO CM  Result Date: 03/30/2022 CLINICAL DATA:  Stroke, hemorrhagic. EXAM: CT VENOGRAM HEAD: CT ANGIOGRAPHY HEAD AND NECK TECHNIQUE: Multidetector CT imaging of the head and neck was performed using the standard protocol during bolus administration of intravenous contrast. Multiplanar CT image reconstructions and MIPs were obtained to evaluate the vascular anatomy. Carotid stenosis measurements (when applicable) are obtained utilizing NASCET criteria, using the distal internal carotid diameter as the denominator. Venographic phase images of the brain were obtained following the administration of intravenous contrast. Multiplanar reformats and maximum intensity projections were generated. RADIATION DOSE REDUCTION: This exam was performed according to the departmental  dose-optimization program which includes automated exposure control, adjustment of the mA and/or kV according to patient size and/or use of iterative reconstruction technique. CONTRAST:  54m OMNIPAQUE IOHEXOL 350 MG/ML SOLN COMPARISON:  Brain MRI 03/30/2022.  Head CT 03/29/2022. FINDINGS: CT HEAD FINDINGS Brain: Cerebral volume is normal for age. Unchanged position of a right frontal approach ventricular catheter, which enters the body of the right lateral ventricle and terminates near midline. Punctate locule of pneumocephalus within the right lateral ventricle frontal horn. A large acute parenchymal hemorrhage centered within the left temporal lobe has not significantly changed in size from the MRI performed earlier today, again measuring 5.9 x 4.6 cm in transaxial dimensions. Stable surrounding edema. Unchanged mass effect with 10 mm rightward midline shift measured at the level of the septum pellucidum. The basal cisterns remain patent. Unchanged large-volume intraventricular extension of hemorrhage, greatest within the left lateral, third and fourth ventricles. Unchanged dilation of the left lateral ventricle. The right lateral ventricle remains decompressed. Small chronic cerebellar infarcts, better appreciated on the prior MRI. Vascular: No hyperdense vessel. Atherosclerotic calcifications. Skull: No fracture or aggressive osseous lesion. Sinuses/Orbits: No orbital mass or acute orbital finding. Trace mucosal thickening scattered within the bilateral ethmoid sinuses. Review of the MIP images confirms the above findings CTA NECK FINDINGS Aortic arch: Standard aortic branching. Atherosclerotic plaque within the visualized aortic arch and proximal major branch vessels of the neck. Streak and beam hardening artifact arising from a dense right-sided contrast bolus partially obscures the right subclavian artery. Within this limitation, there is no appreciable hemodynamically significant innominate or proximal  subclavian artery stenosis. Right carotid system: CCA and ICA patent within the neck without hemodynamically significant stenosis (50% or greater). Mild atherosclerotic plaque about the carotid bifurcation and within the proximal ICA. Left carotid system: CCA and ICA patent within the neck without hemodynamically significant stenosis (50% or greater). Atherosclerotic plaque. Most notably, there is mild-to-moderate calcified plaque about the carotid bifurcation and within the proximal ICA. Vertebral arteries: Codominant  and patent within the neck without stenosis or significant atherosclerotic disease. Skeleton: Levocurvature of the cervical spine. Partially imaged dextrocurvature of the thoracic spine. Cervical spondylosis. No acute fracture or aggressive osseous lesion. Other neck: No neck mass or cervical lymphadenopathy. Upper chest: No consolidation within the imaged lung apices. Centrilobular and paraseptal emphysema. Biapical pleuroparenchymal scarring Review of the MIP images confirms the above findings CTA HEAD FINDINGS Anterior circulation: The intracranial internal carotid arteries are patent. Atherosclerotic plaque within both vessels with no more than mild stenosis The M1 middle cerebral arteries are patent. No M2 proximal branch occlusion is identified. Atherosclerotic irregularity of the M2 and more distal MCA vessels without high-grade proximal stenosis identified. The anterior cerebral arteries are patent. Developmentally diminutive left A1 segment. Cluster of tortuous abnormal vessels along the lateral aspect of the left temporal lobe compatible with an AVF for AVM. There is a large draining vein extending from this site posteriorly to the junction of the left transverse and sigmoid dural venous sinuses. A smaller draining vein extends anteriorly within the left middle cranial fossa. Posterior circulation: The intracranial vertebral arteries are patent. The basilar artery is patent. The posterior  cerebral arteries are patent. Hypoplastic right P1 segment with sizable right posterior communicating artery. The left posterior communicating artery is diminutive or absent. Venous sinuses: Within the limitations of contrast timing, no convincing thrombus. Anatomic variants: As described. Review of the MIP images confirms the above findings CT VENOGRAM HEAD FINDINGS The superior sagittal sinus, internal cerebral veins, vein of Galen, straight sinus, transverse sinuses, sigmoid sinuses and visualized jugular veins are patent. There is no appreciable intracranial venous thrombosis. Cluster of abnormal, tortuous vessels along the lateral aspect of the left temporal lobe as described above. Large draining vein extending from this site posteriorly to the junction of the left transverse and sigmoid dural venous sinuses. A smaller draining vein extends anteriorly within the left middle cranial fossa. CTA head impression #1 will be called to the ordering clinician or representative by the Radiologist Assistant, and communication documented in the PACS or [employer]. IMPRESSION: Non-contrast head CT: 1. Unchanged large acute parenchymal hemorrhage centered within the left temporal lobe with surrounding edema. Unchanged mass effect with 10 mm rightward midline shift. 2. Unchanged large volume intraventricular extension of hemorrhage. 3. Stable position of a right frontal approach ventricular catheter, which enters the body of the right lateral ventricle and terminates near midline. The right lateral ventricle remains decompressed. Unchanged dilation of the left lateral ventricle. a CTA neck: 1. The common carotid, internal carotid and vertebral arteries are patent within the neck without hemodynamically significant stenosis. Atherosclerotic plaque within bilateral carotid systems within the neck, as described. 2. Aortic Atherosclerosis (ICD10-I70.0) and Emphysema (ICD10-J43.9). CTA head: 1. Cluster of abnormal,  tortuous vessels along the lateral aspect of the left temporal lobe compatible with an AVF for AVM. There is a large draining vein extending from this site posteriorly to the junction of the left transverse and sigmoid dural venous sinuses. A smaller draining vein extends anteriorly within the left middle cranial fossa. Catheter-based angiography should be considered for further evaluation. 2. Intracranial atherosclerotic disease without large vessel occlusion or proximal high-grade arterial stenosis. CT venogram head: 1. 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 2. No evidence of dural venous sinus thrombosis. Electronically Signed   By: Kellie Simmering D.O.   On: 03/30/2022 10:21   CT VENOGRAM HEAD  Result Date: 03/30/2022 CLINICAL DATA:  Stroke,  hemorrhagic. EXAM: CT VENOGRAM HEAD: CT ANGIOGRAPHY HEAD AND NECK TECHNIQUE: Multidetector CT imaging of the head and neck was performed using the standard protocol during bolus administration of intravenous contrast. Multiplanar CT image reconstructions and MIPs were obtained to evaluate the vascular anatomy. Carotid stenosis measurements (when applicable) are obtained utilizing NASCET criteria, using the distal internal carotid diameter as the denominator. Venographic phase images of the brain were obtained following the administration of intravenous contrast. Multiplanar reformats and maximum intensity projections were generated. RADIATION DOSE REDUCTION: This exam was performed according to the departmental dose-optimization program which includes automated exposure control, adjustment of the mA and/or kV according to patient size and/or use of iterative reconstruction technique. CONTRAST:  78m OMNIPAQUE IOHEXOL 350 MG/ML SOLN COMPARISON:  Brain MRI 03/30/2022.  Head CT 03/29/2022. FINDINGS: CT HEAD FINDINGS Brain: Cerebral volume is normal for age. Unchanged position of a right frontal approach ventricular catheter, which  enters the body of the right lateral ventricle and terminates near midline. Punctate locule of pneumocephalus within the right lateral ventricle frontal horn. A large acute parenchymal hemorrhage centered within the left temporal lobe has not significantly changed in size from the MRI performed earlier today, again measuring 5.9 x 4.6 cm in transaxial dimensions. Stable surrounding edema. Unchanged mass effect with 10 mm rightward midline shift measured at the level of the septum pellucidum. The basal cisterns remain patent. Unchanged large-volume intraventricular extension of hemorrhage, greatest within the left lateral, third and fourth ventricles. Unchanged dilation of the left lateral ventricle. The right lateral ventricle remains decompressed. Small chronic cerebellar infarcts, better appreciated on the prior MRI. Vascular: No hyperdense vessel. Atherosclerotic calcifications. Skull: No fracture or aggressive osseous lesion. Sinuses/Orbits: No orbital mass or acute orbital finding. Trace mucosal thickening scattered within the bilateral ethmoid sinuses. Review of the MIP images confirms the above findings CTA NECK FINDINGS Aortic arch: Standard aortic branching. Atherosclerotic plaque within the visualized aortic arch and proximal major branch vessels of the neck. Streak and beam hardening artifact arising from a dense right-sided contrast bolus partially obscures the right subclavian artery. Within this limitation, there is no appreciable hemodynamically significant innominate or proximal subclavian artery stenosis. Right carotid system: CCA and ICA patent within the neck without hemodynamically significant stenosis (50% or greater). Mild atherosclerotic plaque about the carotid bifurcation and within the proximal ICA. Left carotid system: CCA and ICA patent within the neck without hemodynamically significant stenosis (50% or greater). Atherosclerotic plaque. Most notably, there is mild-to-moderate calcified  plaque about the carotid bifurcation and within the proximal ICA. Vertebral arteries: Codominant and patent within the neck without stenosis or significant atherosclerotic disease. Skeleton: Levocurvature of the cervical spine. Partially imaged dextrocurvature of the thoracic spine. Cervical spondylosis. No acute fracture or aggressive osseous lesion. Other neck: No neck mass or cervical lymphadenopathy. Upper chest: No consolidation within the imaged lung apices. Centrilobular and paraseptal emphysema. Biapical pleuroparenchymal scarring Review of the MIP images confirms the above findings CTA HEAD FINDINGS Anterior circulation: The intracranial internal carotid arteries are patent. Atherosclerotic plaque within both vessels with no more than mild stenosis The M1 middle cerebral arteries are patent. No M2 proximal branch occlusion is identified. Atherosclerotic irregularity of the M2 and more distal MCA vessels without high-grade proximal stenosis identified. The anterior cerebral arteries are patent. Developmentally diminutive left A1 segment. Cluster of tortuous abnormal vessels along the lateral aspect of the left temporal lobe compatible with an AVF for AVM. There is a large draining vein extending from this site posteriorly to  the junction of the left transverse and sigmoid dural venous sinuses. A smaller draining vein extends anteriorly within the left middle cranial fossa. Posterior circulation: The intracranial vertebral arteries are patent. The basilar artery is patent. The posterior cerebral arteries are patent. Hypoplastic right P1 segment with sizable right posterior communicating artery. The left posterior communicating artery is diminutive or absent. Venous sinuses: Within the limitations of contrast timing, no convincing thrombus. Anatomic variants: As described. Review of the MIP images confirms the above findings CT VENOGRAM HEAD FINDINGS The superior sagittal sinus, internal cerebral veins, vein  of Galen, straight sinus, transverse sinuses, sigmoid sinuses and visualized jugular veins are patent. There is no appreciable intracranial venous thrombosis. Cluster of abnormal, tortuous vessels along the lateral aspect of the left temporal lobe as described above. Large draining vein extending from this site posteriorly to the junction of the left transverse and sigmoid dural venous sinuses. A smaller draining vein extends anteriorly within the left middle cranial fossa. CTA head impression #1 will be called to the ordering clinician or representative by the Radiologist Assistant, and communication documented in the PACS or [employer]. IMPRESSION: Non-contrast head CT: 1. Unchanged large acute parenchymal hemorrhage centered within the left temporal lobe with surrounding edema. Unchanged mass effect with 10 mm rightward midline shift. 2. Unchanged large volume intraventricular extension of hemorrhage. 3. Stable position of a right frontal approach ventricular catheter, which enters the body of the right lateral ventricle and terminates near midline. The right lateral ventricle remains decompressed. Unchanged dilation of the left lateral ventricle. a CTA neck: 1. The common carotid, internal carotid and vertebral arteries are patent within the neck without hemodynamically significant stenosis. Atherosclerotic plaque within bilateral carotid systems within the neck, as described. 2. Aortic Atherosclerosis (ICD10-I70.0) and Emphysema (ICD10-J43.9). CTA head: 1. Cluster of abnormal, tortuous vessels along the lateral aspect of the left temporal lobe compatible with an AVF for AVM. There is a large draining vein extending from this site posteriorly to the junction of the left transverse and sigmoid dural venous sinuses. A smaller draining vein extends anteriorly within the left middle cranial fossa. Catheter-based angiography should be considered for further evaluation. 2. Intracranial atherosclerotic disease  without large vessel occlusion or proximal high-grade arterial stenosis. CT venogram head: 1. 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 2. No evidence of dural venous sinus thrombosis. Electronically Signed   By: Kellie Simmering D.O.   On: 03/30/2022 10:21   MR BRAIN W WO CONTRAST  Result Date: 03/30/2022 CLINICAL DATA:  Initial evaluation for neuro deficit, stroke suspected, intracranial hemorrhage. EXAM: MRI HEAD WITHOUT AND WITH CONTRAST TECHNIQUE: Multiplanar, multiecho pulse sequences of the brain and surrounding structures were obtained without and with intravenous contrast. CONTRAST:  6.59m GADAVIST GADOBUTROL 1 MMOL/ML IV SOLN COMPARISON:  Prior CT from 03/29/2022. FINDINGS: Brain: Examination moderately degraded by motion artifact. Previously identified large intraparenchymal hemorrhage centered at the left temporal lobe again seen, measuring approximately 5.9 x 4.6 x 5.0 cm (estimated volume 68 mL). This is similar in size from prior. Surrounding vasogenic edema with regional mass effect and up to 1 cm of left-to-right shift. This has progressed from prior. Secondary crowding of the basilar cisterns without transtentorial herniation. Following contrast administration, there is an irregular heterogeneous area of postcontrast enhancement along the lateral margin of the hemorrhage, measuring up to 1.4 cm in size (series 18, image 18). Some of this enhancement appears to potentially be contiguous with overlying cortical  veins (series 18, image 17). Additionally, there is question of a few small vascular flow voids within this area (series 15, image 10). Few small parenchymal calcifications seen within this region on prior head CT. Findings raise the possibility for an underlying vascular lesion, although difficult to be certain given the overlying hemorrhage and motion artifact on this exam. Possible mass not entirely excluded. Intraventricular extension with moderate  to large volume blood within the left greater than right lateral ventricles, third ventricle, and fourth ventricle. There has been interval placement of a right frontal approach ventriculostomy with tip at the septum pellucidum. The right lateral ventricle is decompressed from prior exam. Ventriculomegaly involving the left lateral ventricle is relatively stable, with probable small amount of transependymal flow of CSF about the atrium. Third and fourth ventricles remain fairly normal in size. No other acute or chronic blood products seen elsewhere within the brain. No other evidence for acute or subacute infarct. Few small remote bilateral cerebellar infarcts noted. No other mass lesion or mass effect. No extra-axial collection. No other abnormal enhancement. Vascular: Major intracranial vascular flow voids are maintained. Mass effect on the left MCA due to the adjacent hemorrhage noted. Skull and upper cervical spine: Craniocervical junction within normal limits. Bone marrow signal intensity normal. Soft tissue swelling at the right frontal scalp related to the ventriculostomy. Sinuses/Orbits: Globes and orbital soft tissues within normal limits. Paranasal sinuses are largely clear. No mastoid effusion. Other: None. IMPRESSION: 1. 68 mL intraparenchymal hemorrhage centered at the left temporal lobe, similar in size as compared to prior CT. Surrounding vasogenic edema with regional mass effect, with worsened 1 cm of left-to-right shift. 2. Irregular heterogeneous area of enhancement along the lateral margin of the hemorrhage, nonspecific, but raises the possibility for an underlying vascular lesion. Further evaluation with dedicated CTA and/or catheter directed angiogram recommended for further evaluation. Possible mass not excluded, although is felt to be less likely. 3. 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 Interval placement of a right frontal approach ventriculostomy  with decompression of the right lateral ventricle. Ventriculomegaly involving the left lateral ventricle is relatively unchanged. 4. Few small remote bilateral cerebellar infarcts. Case discussed by telephone at the time of interpretation on 03/30/2022 at 5:08 am with provider ASHISH ARORA. Electronically Signed   By: Jeannine Boga M.D.   On: 03/30/2022 05:12   DG Chest 1 View  Result Date: 03/29/2022 CLINICAL DATA:  Dyspnea. EXAM: CHEST  1 VIEW COMPARISON:  Radiograph earlier today. FINDINGS: Heart is normal in size. Normal mediastinal contours, aortic atherosclerosis. Probable calcified lymph nodes in the left hilum. There is peribronchial thickening. No acute airspace disease, pleural effusion, pulmonary edema or pneumothorax. Stable osseous structures. IMPRESSION: Peribronchial thickening likely related smoking. No localizing process. Electronically Signed   By: Keith Rake M.D.   On: 03/29/2022 23:08   CT HEAD WO CONTRAST (5MM)  Result Date: 03/29/2022 CLINICAL DATA:  Stroke, follow up EXAM: CT HEAD WITHOUT CONTRAST TECHNIQUE: Contiguous axial images were obtained from the base of the skull through the vertex without intravenous contrast. RADIATION DOSE REDUCTION: This exam was performed according to the departmental dose-optimization program which includes automated exposure control, adjustment of the mA and/or kV according to patient size and/or use of iterative reconstruction technique. COMPARISON:  CT head 03/29/2022 4:06 p.m. FINDINGS: Brain: No evidence of large-territorial acute infarction. Interval increase in size of a now 6 x 4.4 x 4.8 cm (from 4.8 x 4.4 x 4.7 cm) left frontotemporal intraparenchymal  hemorrhage. Interval development of new foci intraparenchymal hemorrhage within the left uncus/medial temporal lobe. Intraventricular hemorrhage extension. No subarachnoid or subdural hematoma. Stable to minimally increased 7 mm (from 5-6 mm) left to right midline shift. Persistent  impending left uncal herniation with encroachment of the left ambient cistern. No hydrocephalus. Vascular: No hyperdense vessel. Skull: No acute fracture or focal lesion. Sinuses/Orbits: Paranasal sinuses and mastoid air cells are clear. The orbits are unremarkable. Other: None. IMPRESSION: Interval increase in size of a 6 x 4.4 x 4.8 cm left frontotemporal intraparenchymal hemorrhage. Interval development of new foci intraparenchymal hemorrhage within the left uncus/medial temporal lobe. Stable to minimally increased 7 mm (from 5-6 mm) left to right midline shift. Persistent impending left uncal herniation with encroachment on the left ambient cistern. Redemonstration of intraventricular hemorrhage. These results were called by telephone at the time of interpretation on 03/29/2022 at 6:09 pm to provider Dr Cheral Marker, who verbally acknowledged these results. Electronically Signed   By: Iven Finn M.D.   On: 03/29/2022 18:15   DG Chest Portable 1 View  Result Date: 03/29/2022 CLINICAL DATA:  Shortness of breath. EXAM: PORTABLE CHEST 1 VIEW COMPARISON:  Chest radiograph dated 10/09/2021. FINDINGS: The heart size and mediastinal contours are within normal limits. Vascular calcifications are seen in the aortic arch. Both lungs are clear. The visualized skeletal structures are unremarkable. IMPRESSION: No active disease. Aortic Atherosclerosis (ICD10-I70.0). Electronically Signed   By: Zerita Boers M.D.   On: 03/29/2022 17:12   CT HEAD WO CONTRAST (5MM)  Result Date: 03/29/2022 CLINICAL DATA:  Patient to ED via [facility] from work for altered mental status. Patient was found by coworkers in bathroom sitting by toilet. No vomit noted. Patient not wanting to answer questions or follow commands. Hx of COPD and a heavy smoker EXAM: CT HEAD WITHOUT CONTRAST CT CERVICAL SPINE WITHOUT CONTRAST TECHNIQUE: Multidetector CT imaging of the head and cervical spine was performed following the standard protocol without  intravenous contrast. Multiplanar CT image reconstructions of the cervical spine were also generated. RADIATION DOSE REDUCTION: This exam was performed according to the departmental dose-optimization program which includes automated exposure control, adjustment of the mA and/or kV according to patient size and/or use of iterative reconstruction technique. COMPARISON:  CT head 11/16/2008 FINDINGS: CT HEAD FINDINGS Brain: No evidence of large-territorial acute infarction. At least 5 x 4.5 x 4 cm intraparenchymal hemorrhage centered within the left anterior temporal and frontal lobes. Extension of the hemorrhage into the lateral, third, fourth ventricles with no definite associated hydrocephalus. Mild associated surrounding vasogenic edema. Underlying mass lesion not excluded. No definite subdural or subarachnoid hemorrhage identified. Associated 5 mm left-to-right midline shift. Impending uncal herniation on the left. Basilar cisterns are patent. Vascular: No hyperdense vessel. Skull: No acute fracture or focal lesion. Sinuses/Orbits: Paranasal sinuses and mastoid air cells are clear. The orbits are unremarkable. Other: None. CT CERVICAL SPINE FINDINGS Alignment: Normal. Skull base and vertebrae: Multilevel degenerative changes of the spine. No acute fracture. No aggressive appearing focal osseous lesion or focal pathologic process. Soft tissues and spinal canal: No prevertebral fluid or swelling. No visible canal hematoma. Upper chest: Paraseptal and centrilobular emphysematous changes. Biapical pleural/pulmonary scarring. Other: None. IMPRESSION: 1. At least 5 x 4.5 x 4 cm intraparenchymal hemorrhage centered within the left anterior temporal and frontal lobes. Associated intraventricular extension of the hemorrhage. Associated 5 mm left-to-right midline shift. Impending uncal herniation on the left. Underlying mass lesion not excluded. 2. No acute displaced fracture or traumatic listhesis of  the cervical spine. 3.   Emphysema (ICD10-J43.9). These results were called by telephone at the time of interpretation on 03/29/2022 at 4:21 pm to provider [facility] , who verbally acknowledged these results. Electronically Signed   By: Iven Finn M.D.   On: 03/29/2022 16:23   CT Cervical Spine Wo Contrast  Result Date: 03/29/2022 CLINICAL DATA:  Patient to ED via [facility] from work for altered mental status. Patient was found by coworkers in bathroom sitting by toilet. No vomit noted. Patient not wanting to answer questions or follow commands. Hx of COPD and a heavy smoker EXAM: CT HEAD WITHOUT CONTRAST CT CERVICAL SPINE WITHOUT CONTRAST TECHNIQUE: Multidetector CT imaging of the head and cervical spine was performed following the standard protocol without intravenous contrast. Multiplanar CT image reconstructions of the cervical spine were also generated. RADIATION DOSE REDUCTION: This exam was performed according to the departmental dose-optimization program which includes automated exposure control, adjustment of the mA and/or kV according to patient size and/or use of iterative reconstruction technique. COMPARISON:  CT head 11/16/2008 FINDINGS: CT HEAD FINDINGS Brain: No evidence of large-territorial acute infarction. At least 5 x 4.5 x 4 cm intraparenchymal hemorrhage centered within the left anterior temporal and frontal lobes. Extension of the hemorrhage into the lateral, third, fourth ventricles with no definite associated hydrocephalus. Mild associated surrounding vasogenic edema. Underlying mass lesion not excluded. No definite subdural or subarachnoid hemorrhage identified. Associated 5 mm left-to-right midline shift. Impending uncal herniation on the left. Basilar cisterns are patent. Vascular: No hyperdense vessel. Skull: No acute fracture or focal lesion. Sinuses/Orbits: Paranasal sinuses and mastoid air cells are clear. The orbits are unremarkable. Other: None. CT CERVICAL SPINE FINDINGS Alignment: Normal. Skull base  and vertebrae: Multilevel degenerative changes of the spine. No acute fracture. No aggressive appearing focal osseous lesion or focal pathologic process. Soft tissues and spinal canal: No prevertebral fluid or swelling. No visible canal hematoma. Upper chest: Paraseptal and centrilobular emphysematous changes. Biapical pleural/pulmonary scarring. Other: None. IMPRESSION: 1. At least 5 x 4.5 x 4 cm intraparenchymal hemorrhage centered within the left anterior temporal and frontal lobes. Associated intraventricular extension of the hemorrhage. Associated 5 mm left-to-right midline shift. Impending uncal herniation on the left. Underlying mass lesion not excluded. 2. No acute displaced fracture or traumatic listhesis of the cervical spine. 3.  Emphysema (ICD10-J43.9). These results were called by telephone at the time of interpretation on 03/29/2022 at 4:21 pm to provider [facility] , who verbally acknowledged these results. Electronically Signed   By: Iven Finn M.D.   On: 03/29/2022 16:23    Microbiology: Results for orders placed or performed during the hospital encounter of 03/29/22  MRSA Next Gen by PCR, Nasal     Status: None   Collection Time: 03/29/22  8:40 PM   Specimen: Nasal Mucosa; Nasal Swab  Result Value Ref Range Status   MRSA by PCR Next Gen NOT DETECTED NOT DETECTED Final    Comment: (NOTE) The GeneXpert MRSA Assay (FDA approved for NASAL specimens only), is one component of a comprehensive MRSA colonization surveillance program. It is not intended to diagnose MRSA infection nor to guide or monitor treatment for MRSA infections. Test performance is not FDA approved in patients less than 97 years old. Performed at [facility], [place] [address], [place], [place] [address]   Culture, blood (Routine X 2) w Reflex to ID Panel     Status: None (Preliminary result)   Collection Time: 04/12/22  8:49 AM  Specimen: BLOOD  Result Value Ref Range Status   Specimen Description BLOOD  SITE NOT SPECIFIED  Final   Special Requests   Final    BOTTLES DRAWN AEROBIC AND ANAEROBIC Blood Culture adequate volume   Culture   Final    NO GROWTH 4 DAYS Performed at [facility], [address], [place], [place] [address]    Report Status PENDING  Incomplete  Culture, blood (Routine X 2) w Reflex to ID Panel     Status: None (Preliminary result)   Collection Time: 04/12/22  8:49 AM   Specimen: BLOOD  Result Value Ref Range Status   Specimen Description BLOOD SITE NOT SPECIFIED  Final   Special Requests   Final    BOTTLES DRAWN AEROBIC AND ANAEROBIC Blood Culture adequate volume   Culture   Final    NO GROWTH 4 DAYS Performed at [facility], [address] [place], [place] [address]    Report Status PENDING  Incomplete    Labs: CBC: Recent Labs  Lab 04/11/22 0816 04/12/22 0317 04/13/22 0416 04/14/22 0336 04/15/22 0206  WBC 12.4* 18.6* 15.7* 11.9* 10.0  HGB 13.8 13.3 13.2 12.3 11.4*  HCT 39.9 38.7 39.1 36.5 34.2*  MCV 88.5 88.2 89.7 90.1 90.2  PLT 368 344 334 350 270   Basic Metabolic Panel: Recent Labs  Lab 04/11/22 0816 04/12/22 0317 04/13/22 0416 04/14/22 0336 04/15/22 0206  NA 136 138 137 138 136  K 3.7 3.6 4.2 4.4 3.8  CL 101 103 106 107 103  CO2 '27 24 22 22 25  '$ GLUCOSE 118* 132* 109* 100* 115*  BUN '13 18 21 22 22  '$ CREATININE 0.73 0.75 0.69 0.69 0.61  CALCIUM 9.0 9.2 8.7* 8.9 8.6*  MG  --   --  2.2 2.2  --    Liver Function Tests: Recent Labs  Lab 04/13/22 0416  AST 34  ALT 67*  ALKPHOS 63  BILITOT 0.7  PROT 6.5  ALBUMIN 2.9*   CBG: Recent Labs  Lab 04/15/22 0549 04/15/22 1200 04/15/22 1755 04/15/22 2312 04/16/22 0556  GLUCAP 86 94 106* 100* 88    Discharge time spent: greater than 30 minutes.  Signed: Flora Lipps, MD [facility] 04/16/2022

## 2022-04-16 NOTE — H&P (Signed)
Physical Medicine and Rehabilitation Admission H&P       HPI: [name] Giles is a 66 year old right-handed female with history of COPD/tobacco abuse.  Per chart review patient lives with her son.  1 level home 4 steps to enter.  Independent prior to admission working at a [employer].  Presented 03/29/2022 after being found down.  With headache as well as vomiting with altered mental status.  CT showed a 5 x 4.5 x 4 cm intraparenchymal hemorrhage centered within the left anterior temporal and frontal lobes.  5 mm left to right midline shift as well as hydrocephalus.  CT angiogram head and neck no evidence of dural venous sinus thrombosis.  No large vessel occlusion.  Admission chemistries unremarkable, CK 61, troponin negative, ammonia level 20, urine drug screen negative.  Echocardiogram with ejection fraction of 60 to 65% no wall motion abnormalities.  Initially maintained on 3% hypertonic saline.  Patient did undergo placement of right frontal ventriculostomy via bur hole 03/29/2022 per Dr. Arnoldo Morale and removed 8/18.  Attempts at cerebral angiogram aborted due to primarily significant patient motion.  Latest follow-up cranial CT scan 04/12/2022 was stable noting decreased mass effect with decreasing edema around the hemorrhage.  She was cleared to begin subcutaneous heparin for DVT prophylaxis 04/01/2022.  Patient course delirium initially on Precedex and currently maintained on Seroquel.  Bouts of urinary retention placed on low-dose Urecholine.  Patient spiked a low-grade fever 102.5 with leukocytosis 18,000-15700 and monitored improved to 10,000.  Chest x-ray showed no signs of edema or pulmonary consolidation.  Urinalysis negative nitrite.  Blood cultures no growth to date.  Placed on empiric Rocephin as well as vancomycin transitioned to Unasyn 04/16/2022 x 3 days and stop.  Diet currently dysphagia 1 thin liquids.  Therapy evaluations completed due to patient decreased functional mobility  was admitted for a comprehensive rehab program.   Review of Systems  Constitutional:  Negative for chills and fever.  HENT:  Negative for hearing loss.   Eyes:  Negative for blurred vision and double vision.  Respiratory:  Positive for shortness of breath. Negative for cough and wheezing.   Cardiovascular:  Negative for chest pain, palpitations and leg swelling.  Gastrointestinal:  Positive for constipation and vomiting. Negative for heartburn and nausea.  Genitourinary:  Negative for dysuria, flank pain and hematuria.  Musculoskeletal:  Positive for joint pain and myalgias.  Skin:  Negative for rash.  Neurological:  Positive for headaches.  All other systems reviewed and are negative.       Past Medical History:  Diagnosis Date   Asthma     Medical history non-contributory           Past Surgical History:  Procedure Laterality Date   COLONOSCOPY       COLONOSCOPY N/A 04/06/2013    Procedure: COLONOSCOPY;  Surgeon: Daneil Dolin, MD;  Location: [facility];  Service: Endoscopy;  Laterality: N/A;  9:30 AM   ENDOMETRIAL ABLATION       IR ANGIO INTRA EXTRACRAN SEL INTERNAL CAROTID BILAT MOD SED   04/04/2022   IR US GUIDE VASC ACCESS RIGHT   04/04/2022         Family History  Adopted: Yes  Problem Relation Age of Onset   Breast cancer Mother     Hypertension Brother     Diabetes Brother     Hypertension Brother     Colon cancer Neg Hx      Social History:  reports that she has been smoking cigarettes. She has a 30.00 pack-year smoking history. She has never used smokeless tobacco. She reports current alcohol use. She reports that she does not use drugs. Allergies: No Known Allergies       Medications Prior to Admission  Medication Sig Dispense Refill   albuterol (VENTOLIN HFA) 108 (90 Base) MCG/ACT inhaler Inhale 1-2 puffs into the lungs every 4 (four) hours as needed for shortness of breath.       ibuprofen (ADVIL) 600 MG tablet Take 1 tablet (600 mg total) by mouth every  6 (six) hours as needed. (Patient taking differently: Take 600 mg by mouth every 6 (six) hours as needed for mild pain.) 30 tablet 0   albuterol (PROVENTIL) 4 MG tablet Take 4 mg by mouth 3 (three) times daily. (Patient not taking: Reported on 03/29/2022)       fluticasone (FLONASE) 50 MCG/ACT nasal spray Place 2 sprays into both nostrils daily. (Patient not taking: Reported on 03/29/2022) 16 g 0   SYMBICORT 160-4.5 MCG/ACT inhaler Inhale 2 puffs into the lungs daily. (Patient not taking: Reported on 03/29/2022)              Home: Home Living Family/patient expects to be discharged to:: Private residence Living Arrangements: Children (68 yo son; grandchild every other week) Available Help at Discharge: Family, Available PRN/intermittently Type of Home: House Home Access: Stairs to enter Technical brewer of Steps: 4 Entrance Stairs-Rails: Right, Left Home Layout: One level Bathroom Shower/Tub: Tub/shower unit (thresholds/small changes in height throughout the house) Biochemist, clinical: Standard Bathroom Accessibility: No Home Equipment: None Additional Comments: thresholds/small changes in height throughout the house  Lives With: Son   Functional History: Prior Function Prior Level of Function : Independent/Modified Independent, Working/employed, 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 Transfers Overall transfer level: Needs assistance Equipment used: Rolling walker (2 wheels) Transfers: Sit to/from Stand Sit to Stand: Min assist Bed to/from chair/wheelchair/BSC transfer type:: Step pivot Step pivot transfers: Min assist General transfer  comment: Min A to power to standing, despite cues for hand placement, pt pulling up on RW. Stood from [employer], from chair x1. Transferred to chair post ambulation. Ambulation/Gait Ambulation/Gait assistance: Mod assist, Min assist Gait Distance (Feet): 80 Feet (x2 bouts) Assistive device: Rolling walker (2 wheels) Gait Pattern/deviations: Decreased stride length, Narrow base of support, Step-through pattern, Trunk flexed General Gait Details: Slow, unsteady gait with forward flexed posture with cues for RW proximity/management as RW too far anterior, bil knee instability when fatigued. 1 seated rest break. Gait velocity: decreased Gait velocity interpretation: <1.31 ft/sec, indicative of household ambulator   ADL: ADL Overall ADL's : Needs assistance/impaired Eating/Feeding: Minimal assistance, Set up, Sitting Eating/Feeding Details (indicate cue type and reason): attempted to assist patient with self feeding with patient resistant Grooming: Wash/dry hands, Wash/dry face, Brushing hair, Sitting, Moderate assistance Grooming Details (indicate cue type and reason): patient able to wash face and hands with multiple verbal cues.  Mod assist to brush hair Upper Body Bathing: Minimal assistance Lower Body Bathing: Moderate assistance, Sit to/from stand Lower Body Bathing Details (indicate cue type and reason): for peri area cleaning Upper Body Dressing : Minimal  assistance Upper Body Dressing Details (indicate cue type and reason): to change gown Lower Body Dressing: Moderate assistance Lower Body Dressing Details (indicate cue type and reason): to donn socks Toilet Transfer: Minimal assistance, Ambulation, Regular Toilet Toilet Transfer Details (indicate cue type and reason): verbal cues for safety and min assist for balance and walker use Toileting- Clothing Manipulation and Hygiene: Moderate assistance Toileting - Clothing Manipulation Details (indicate cue type and reason): nsg reports  incontinenece of BM earlier this am Functional mobility during ADLs: Moderate assistance General ADL Comments: difficulty following directions   Cognition: Cognition Overall Cognitive Status: Difficult to assess Arousal/Alertness: Lethargic Orientation Level: Disoriented X4 Attention: Focused Focused Attention: Impaired Focused Attention Impairment: Verbal basic Cognition Arousal/Alertness: Lethargic, Awake/alert Behavior During Therapy: Flat affect Overall Cognitive Status: Difficult to assess Area of Impairment: Following commands, Safety/judgement Orientation Level: Disoriented to, Place, Time, Situation (able to state name) Current Attention Level: Focused Memory: Decreased short-term memory Following Commands: Follows one step commands inconsistently, Follows one step commands with increased time Safety/Judgement: Decreased awareness of safety, Decreased awareness of deficits Awareness: Intellectual Problem Solving: Slow processing, Decreased initiation, Difficulty sequencing, Requires verbal cues, Requires tactile cues General Comments: Lethargic initially but once sitting EOB and moving, became more awake. follows simple commands with repetition and increased time, "what do you want me to do?" otherwise speech mostly incoherent Difficult to assess due to: Impaired communication (expressive difficulties)   Physical Exam: Blood pressure (!) 115/48, pulse 88, temperature 99.5 F (37.5 C), temperature source Oral, resp. rate 20, height '5\' 5"'$  (1.651 m), weight 66.2 kg, SpO2 99 %. Physical Exam Constitutional:      General: She is not in acute distress.    Appearance: She is not ill-appearing.  HENT:     Head:     Comments: Staples/sutures on shaved central scalp    Nose: Nose normal.     Mouth/Throat:     Mouth: Mucous membranes are moist.  Eyes:     General: No scleral icterus.    Conjunctiva/sclera: Conjunctivae normal.  Cardiovascular:     Rate and Rhythm: Normal rate  and regular rhythm.     Heart sounds: No murmur heard.    No gallop.  Pulmonary:     Effort: Pulmonary effort is normal.     Breath sounds: Normal breath sounds.  Abdominal:     General: Bowel sounds are normal.     Palpations: Abdomen is soft.  Musculoskeletal:        General: No swelling or tenderness. Normal range of motion.     Cervical back: Normal range of motion.     Right lower leg: No edema.     Left lower leg: No edema.  Skin:    General: Skin is warm and dry.  Neurological:     Mental Status: She is alert.     Comments: Patient is alert and comfortable.  Makes eye contact with examiner.  Follows simple commands with extra time, repetition and cues. Would not cooperate consistently with CN exam. Word salad with occasional appropriate response to simple questions (<10%). Moves all 4 limbs, senses pain. No abnl tone.   Psychiatric:     Comments: Seems generally up beat, engaging.         Lab Results Last 48 Hours        Results for orders placed or performed during the hospital encounter of 03/29/22 (from the past 48 hour(s))  Glucose, capillary     Status: Abnormal    Collection  Time: 04/14/22 11:38 AM  Result Value Ref Range    Glucose-Capillary 116 (H) 70 - 99 mg/dL      Comment: Glucose reference range applies only to samples taken after fasting for at least 8 hours.    Comment 1 Notify RN    Glucose, capillary     Status: Abnormal    Collection Time: 04/14/22  5:51 PM  Result Value Ref Range    Glucose-Capillary 131 (H) 70 - 99 mg/dL      Comment: Glucose reference range applies only to samples taken after fasting for at least 8 hours.    Comment 1 Notify RN    Glucose, capillary     Status: Abnormal    Collection Time: 04/14/22 11:16 PM  Result Value Ref Range    Glucose-Capillary 103 (H) 70 - 99 mg/dL      Comment: Glucose reference range applies only to samples taken after fasting for at least 8 hours.  Basic metabolic panel     Status: Abnormal     Collection Time: 04/15/22  2:06 AM  Result Value Ref Range    Sodium 136 135 - 145 mmol/L    Potassium 3.8 3.5 - 5.1 mmol/L    Chloride 103 98 - 111 mmol/L    CO2 25 22 - 32 mmol/L    Glucose, Bld 115 (H) 70 - 99 mg/dL      Comment: Glucose reference range applies only to samples taken after fasting for at least 8 hours.    BUN 22 8 - 23 mg/dL    Creatinine, Ser 0.61 0.44 - 1.00 mg/dL    Calcium 8.6 (L) 8.9 - 10.3 mg/dL    GFR, Estimated >60 >60 mL/min      Comment: (NOTE) Calculated using the CKD-EPI Creatinine Equation (2021)      Anion gap 8 5 - 15      Comment: Performed at [facility] [address], [place], [place] [address]  CBC     Status: Abnormal    Collection Time: 04/15/22  2:06 AM  Result Value Ref Range    WBC 10.0 4.0 - 10.5 K/uL    RBC 3.79 (L) 3.87 - 5.11 MIL/uL    Hemoglobin 11.4 (L) 12.0 - 15.0 g/dL    HCT 34.2 (L) 36.0 - 46.0 %    MCV 90.2 80.0 - 100.0 fL    MCH 30.1 26.0 - 34.0 pg    MCHC 33.3 30.0 - 36.0 g/dL    RDW 13.6 11.5 - 15.5 %    Platelets 386 150 - 400 K/uL    nRBC 0.0 0.0 - 0.2 %      Comment: Performed at [facility], [place] [address] [place], [place] [address]  Glucose, capillary     Status: None    Collection Time: 04/15/22  5:49 AM  Result Value Ref Range    Glucose-Capillary 86 70 - 99 mg/dL      Comment: Glucose reference range applies only to samples taken after fasting for at least 8 hours.  Glucose, capillary     Status: None    Collection Time: 04/15/22 12:00 PM  Result Value Ref Range    Glucose-Capillary 94 70 - 99 mg/dL      Comment: Glucose reference range applies only to samples taken after fasting for at least 8 hours.    Comment 1 Notify RN    Glucose, capillary     Status: Abnormal    Collection  Time: 04/15/22  5:55 PM  Result Value Ref Range    Glucose-Capillary 106 (H) 70 - 99 mg/dL      Comment: Glucose reference range applies only to samples taken after fasting for at least 8 hours.    Comment 1 Notify  RN    Glucose, capillary     Status: Abnormal    Collection Time: 04/15/22 11:12 PM  Result Value Ref Range    Glucose-Capillary 100 (H) 70 - 99 mg/dL      Comment: Glucose reference range applies only to samples taken after fasting for at least 8 hours.  Vancomycin, trough     Status: Abnormal    Collection Time: 04/16/22  3:50 AM  Result Value Ref Range    Vancomycin Tr 11 (L) 15 - 20 ug/mL      Comment: Performed at [facility], [place] [address], [place], [place] [address]  Glucose, capillary     Status: None    Collection Time: 04/16/22  5:56 AM  Result Value Ref Range    Glucose-Capillary 88 70 - 99 mg/dL      Comment: Glucose reference range applies only to samples taken after fasting for at least 8 hours.      Imaging Results (Last 48 hours)  No results found.         Blood pressure (!) 115/48, pulse 88, temperature 99.5 F (37.5 C), temperature source Oral, resp. rate 20, height '5\' 5"'$  (1.651 m), weight 66.2 kg, SpO2 99 %.   Medical Problem List and Plan: 1. 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   -WILL NEED FORMAL CATHETER ANGIOGRAM BEFORE DISCHARGE FROM CIR PER DR [id number]             -patient may shower             -ELOS/Goals: 10-12 days, supervision goals with PT, OT, mod assist with language 2.  Antithrombotics: -DVT/anticoagulation:  Pharmaceutical: Heparin             -antiplatelet therapy: N/A 3. Pain Management: Oxycodone as needed 4. Mood/Behavior/Sleep: Melatonin 3 mg nightly.  Provide emotional support             -antipsychotic agents: Seroquel 25 mg nightly             -monitor sleep habits 5. Neuropsych/cognition: This patient is not capable of making decisions on her own behalf. 6. Skin/Wound Care: Routine skin checks. Remove sutures/staples soon.  7. Fluids/Electrolytes/Nutrition: Routine in and outs follow-up chemistries 8.  Hyperlipidemia.  Crestor 9.  COPD  with tobacco use.  Continue inhalers as directed.  Provide counts regards to cessation of nicotine products 10.  Hypertension.  Norvasc 2.5 mg daily.  Monitor with increased mobility 11.  Urinary retention.  Urecholine 10 mg 3 times daily.  Check PVR' and emptying patterns.  12.  Hyperlipidemia.  Crestor 13.  Leukocytosis.   Maintained initially on empiric IV Rocephin and vancomycin.  Transitioned to Unasyn 04/16/2022 x 3 days and stop             -temps appear to be falling  14.  Dysphagia.  Dysphagia #1 thin liquids.                -advance per SLP   Cathlyn Parsons, PA-C 04/16/2022   I have personally performed a face to face diagnostic evaluation of this patient and formulated the key components of the plan.  Additionally, I have  personally reviewed laboratory data, imaging studies, as well as relevant notes and concur with the physician assistant's documentation above.  The patient's status has not changed from the original H&P.  Any changes in documentation from the acute care chart have been noted above.  Meredith Staggers, MD, [facility] Drown

## 2022-04-16 NOTE — TOC Transition Note (Signed)
Transition of Care [facility]) - CM/SW Discharge Note   Patient Details  Name: [name] Giles MRN: [id number] Date of Birth: 1956/02/14  [facility] CM/SW Contact:  [name] Bender, RN Phone Number: 04/16/2022, 11:39 AM   Clinical Narrative:     Patient discharging to CIR today. TOC will sign off   Final next level of care: [place] Barriers to Discharge: Continued Medical Work up, Ship broker, SNF Pending bed offer   Patient Goals and CMS Choice        Discharge Placement                       Discharge Plan and Services                                     Social Determinants of Health (SDOH) Interventions     Readmission Risk Interventions     No data to display

## 2022-04-16 NOTE — Discharge Instructions (Addendum)
Inpatient Rehab Discharge Instructions  [name] Giles Discharge date and time: No discharge date for patient encounter.   Activities/Precautions/ Functional Status: Activity: As tolerated Diet:  Wound Care: Routine skin checks Functional status:  ___ No restrictions     ___ Walk up steps independently ___ 24/7 supervision/assistance   ___ Walk up steps with assistance ___ Intermittent supervision/assistance  ___ Bathe/dress independently ___ Walk with walker     _x__ Bathe/dress with assistance ___ Walk Independently    ___ Shower independently ___ Walk with assistance    ___ Shower with assistance ___ No alcohol     ___ Return to work/school _______     Special Instructions: No driving smoking or alcohol    COMMUNITY REFERRALS UPON DISCHARGE:    Home Health:   PT   OT   SP                  Agency:[facility]   Medical Equipment/Items Ordered: [place]                                                 Agency/Supplier:[facility]   [phone]     My questions have been answered and I understand these instructions. I will adhere to these goals and the provided educational materials after my discharge from the hospital.  Patient/Caregiver Signature _______________________________ Date __________  Clinician Signature _______________________________________ Date __________  Please bring this form and your medication list with you to all your follow-up doctor's appointments.

## 2022-04-16 NOTE — Plan of Care (Signed)
  Problem: Education: Goal: Knowledge of disease or condition will improve Outcome: Progressing Goal: Knowledge of patient specific risk factors will improve (INDIVIDUALIZE FOR PATIENT) Outcome: Progressing   Problem: Self-Care: Goal: Ability to participate in self-care as condition permits will improve Outcome: Progressing Goal: Ability to communicate needs accurately will improve Outcome: Progressing   Problem: Nutrition: Goal: Risk of aspiration will decrease Outcome: Progressing Goal: Dietary intake will improve Outcome: Progressing

## 2022-04-16 NOTE — Progress Notes (Signed)
Inpatient Rehab Admissions Coordinator:    I have a CIR bed for this Pt. Today. RN May call report to [phone].  Clemens Catholic, [place], [place] Admissions Coordinator  [phone] ([place]) [phone] (office)

## 2022-04-16 NOTE — Progress Notes (Signed)
Pharmacy Antibiotic Note  [name] Giles is a 66 y.o. 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  Pharmacy has been consulted for Vancomycin dosing for empiric meningitis treatment.  8/28 AM update:  Vancomycin trough sub-therapeutic at 11 Renal function stable  Plan: Inc vancomycin to 1000 mg IV q12h MD dosing Ceftriaxone 2g IV q12h  Re-check vancomycin level in 4-5 doses   Height: '5\' 5"'$  (165.1 cm) Weight: 66.2 kg (145 lb 15.1 oz) IBW/kg (Calculated) : 57  Temp (24hrs), Avg:99.7 F (37.6 C), Min:99.3 F (37.4 C), Max:100.3 F (37.9 C)  Recent Labs  Lab 04/11/22 0816 04/12/22 0317 04/13/22 0416 04/14/22 0336 04/15/22 0206 04/16/22 0350  WBC 12.4* 18.6* 15.7* 11.9* 10.0  --   CREATININE 0.73 0.75 0.69 0.69 0.61  --   VANCOTROUGH  --   --   --   --   --  11*     Estimated Creatinine Clearance: 62.2 mL/min (by C-G formula based on SCr of 0.61 mg/dL).    No Known Allergies  Narda Bonds, PharmD, BCPS Clinical Pharmacist Phone: [phone]

## 2022-04-17 ENCOUNTER — Inpatient Hospital Stay (HOSPITAL_COMMUNITY): Payer: 59

## 2022-04-17 DIAGNOSIS — I611 Nontraumatic intracerebral hemorrhage in hemisphere, cortical: Secondary | ICD-10-CM | POA: Diagnosis not present

## 2022-04-17 LAB — CULTURE, BLOOD (ROUTINE X 2)
Culture: NO GROWTH
Culture: NO GROWTH
Special Requests: ADEQUATE
Special Requests: ADEQUATE

## 2022-04-17 LAB — COMPREHENSIVE METABOLIC PANEL
ALT: 53 U/L — ABNORMAL HIGH (ref 0–44)
AST: 31 U/L (ref 15–41)
Albumin: 2.6 g/dL — ABNORMAL LOW (ref 3.5–5.0)
Alkaline Phosphatase: 57 U/L (ref 38–126)
Anion gap: 7 (ref 5–15)
BUN: 12 mg/dL (ref 8–23)
CO2: 25 mmol/L (ref 22–32)
Calcium: 8.6 mg/dL — ABNORMAL LOW (ref 8.9–10.3)
Chloride: 105 mmol/L (ref 98–111)
Creatinine, Ser: 0.63 mg/dL (ref 0.44–1.00)
GFR, Estimated: >60 mL/min (ref >60)
Glucose, Bld: 98 mg/dL (ref 70–99)
Potassium: 3.9 mmol/L (ref 3.5–5.1)
Sodium: 137 mmol/L (ref 135–145)
Total Bilirubin: 0.4 mg/dL (ref 0.3–1.2)
Total Protein: 5.4 g/dL — ABNORMAL LOW (ref 6.5–8.1)

## 2022-04-17 LAB — CBC WITH DIFFERENTIAL/PLATELET
Abs Immature Granulocytes: 0.06 10*3/uL (ref 0.00–0.07)
Basophils Absolute: 0 10*3/uL (ref 0.0–0.1)
Basophils Relative: 0 %
Eosinophils Absolute: 0.1 10*3/uL (ref 0.0–0.5)
Eosinophils Relative: 1 %
HCT: 34.2 % — ABNORMAL LOW (ref 36.0–46.0)
Hemoglobin: 11.4 g/dL — ABNORMAL LOW (ref 12.0–15.0)
Immature Granulocytes: 1 %
Lymphocytes Relative: 22 %
Lymphs Abs: 2.4 10*3/uL (ref 0.7–4.0)
MCH: 30.2 pg (ref 26.0–34.0)
MCHC: 33.3 g/dL (ref 30.0–36.0)
MCV: 90.7 fL (ref 80.0–100.0)
Monocytes Absolute: 0.6 10*3/uL (ref 0.1–1.0)
Monocytes Relative: 6 %
Neutro Abs: 7.6 10*3/uL (ref 1.7–7.7)
Neutrophils Relative %: 70 %
Platelets: 356 10*3/uL (ref 150–400)
RBC: 3.77 MIL/uL — ABNORMAL LOW (ref 3.87–5.11)
RDW: 13.6 % (ref 11.5–15.5)
WBC: 10.8 10*3/uL — ABNORMAL HIGH (ref 4.0–10.5)
nRBC: 0 % (ref 0.0–0.2)

## 2022-04-17 LAB — VITAMIN D 25 HYDROXY (VIT D DEFICIENCY, FRACTURES): Vit D, 25-Hydroxy: 32.75 ng/mL (ref 30–100)

## 2022-04-17 NOTE — Progress Notes (Signed)
Inpatient Rehabilitation  Patient information reviewed and entered into eRehab system by Sriya Kroeze M. Naydeen Speirs, M.A., CCC/SLP, PPS Coordinator.  Information including medical coding, functional ability and quality indicators will be reviewed and updated through discharge.    

## 2022-04-17 NOTE — Progress Notes (Signed)
PROGRESS NOTE   Subjective/Complaints: No new complaints this morning Aphasic Reviewed labs with her: WBC decreased, Hgb improved  ROS: unable to obtain due to aphasia   Objective:   No results found. Recent Labs    04/16/22 0350 04/17/22 0520  WBC 11.2* 10.8*  HGB 11.2* 11.4*  HCT 33.8* 34.2*  PLT 370 356   Recent Labs    04/15/22 0206 04/16/22 0350 04/17/22 0520  NA 136  --  137  K 3.8  --  3.9  CL 103  --  105  CO2 25  --  25  GLUCOSE 115*  --  98  BUN 22  --  12  CREATININE 0.61 0.59 0.63  CALCIUM 8.6*  --  8.6*    Intake/Output Summary (Last 24 hours) at 04/17/2022 2993 Last data filed at 04/17/2022 0810 Gross per 24 hour  Intake 300 ml  Output --  Net 300 ml        Physical Exam: Vital Signs Blood pressure 117/69, pulse 90, temperature 98.4 F (36.9 C), temperature source Oral, resp. rate 16, SpO2 97 %. Gen: no distress, normal appearing HEENT: oral mucosa pink and moist, NCAT Cardio: Reg rate Chest: normal effort, normal rate of breathing Abd: soft, non-distended Ext: no edema Psych: pleasant, normal affect Skin:    General: Skin is warm and dry.  Neurological:     Mental Status: She is alert.     Comments: Patient is alert and comfortable.  Makes eye contact with examiner.  Follows simple commands with extra time, repetition and cues. Would not cooperate consistently with CN exam. Word salad with occasional appropriate response to simple questions (<10%). Moves all 4 limbs, senses pain. No abnl tone.   Psychiatric:     Comments: Seems generally up beat, engaging.     Assessment/Plan: 1. Functional deficits which require 3+ hours per day of interdisciplinary therapy in a comprehensive inpatient rehab setting. Physiatrist is providing close team supervision and 24 hour management of active medical problems listed below. Physiatrist and rehab team continue to assess barriers to  discharge/monitor patient progress toward functional and medical goals  Care Tool:  Bathing              Bathing assist       Upper Body Dressing/Undressing Upper body dressing        Upper body assist      Lower Body Dressing/Undressing Lower body dressing            Lower body assist       Toileting Toileting    Toileting assist Assist for toileting: Dependent - Patient 0%     Transfers Chair/bed transfer  Transfers assist  Chair/bed transfer activity did not occur: Safety/medical concerns        Locomotion Ambulation   Ambulation assist              Walk 10 feet activity   Assist           Walk 50 feet activity   Assist           Walk 150 feet activity   Assist  Walk 10 feet on uneven surface  activity   Assist           Wheelchair     Assist               Wheelchair 50 feet with 2 turns activity    Assist            Wheelchair 150 feet activity     Assist          Blood pressure 117/69, pulse 90, temperature 98.4 F (36.9 C), temperature source Oral, resp. rate 16, SpO2 97 %.  Medical Problem List and Plan: 1. 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   -WILL NEED FORMAL CATHETER ANGIOGRAM BEFORE DISCHARGE FROM CIR PER DR CHYIFOYDX             -patient may shower             -ELOS/Goals: 10-12 days, supervision goals with PT, OT, mod assist with language 2.  Antithrombotics: -DVT/anticoagulation:  Pharmaceutical: Heparin             -antiplatelet therapy: N/A 3. Pain Management: Oxycodone as needed 4. Mood/Behavior/Sleep: Melatonin 3 mg nightly.  Provide emotional support             -antipsychotic agents: Seroquel 25 mg nightly             -monitor sleep habits 5. Neuropsych/cognition: This patient is not capable of making decisions on her own behalf. 6. Skin/Wound Care: Routine  skin checks. Remove sutures/staples soon.  7. Fluids/Electrolytes/Nutrition: Routine in and outs follow-up chemistries 8.  Hyperlipidemia.  Crestor 9.  COPD with tobacco use.  Continue inhalers as directed.  Provide counts regards to cessation of nicotine products 10.  Hypotension: discontinue Norvasc 2.5 mg daily.  Monitor with increased mobility 11.  Urinary retention.  Urecholine 10 mg 3 times daily.  Check PVR' and emptying patterns.  12.  Hyperlipidemia.  Crestor 13.  Leukocytosis.   Maintained initially on empiric IV Rocephin and vancomycin.  Transitioned to Unasyn 04/16/2022 x 3 days and stop             -temps appear to be falling  14.  Dysphagia.  Dysphagia #1 thin liquids.                -advance per SLP 15. Screening for vitamin D deficiency: add on to today's labs.  16. Hypoalbuminemia: advised to choose high protein foods    LOS: 1 days A FACE TO FACE EVALUATION WAS PERFORMED  Clide Deutscher [place] Cookson 04/17/2022, 9:23 AM

## 2022-04-17 NOTE — Evaluation (Signed)
Speech Language Pathology Assessment and Plan  Patient Details  Name: [name] Giles MRN: [id number] Date of Birth: [birth year]  SLP Diagnosis: Dysphagia;Aphasia;Cognitive Impairments  Rehab Potential: Good ELOS: 7-9 days    Today's Date: 04/17/2022 SLP Individual Time: 1330-1430 SLP Individual Time Calculation (min): 60 min   Hospital Problem: Principal Problem:   ICH (intracerebral hemorrhage) ([facility])  Past Medical History:  Past Medical History:  Diagnosis Date   Asthma    Medical history non-contributory    Past Surgical History:  Past Surgical History:  Procedure Laterality Date   COLONOSCOPY     COLONOSCOPY N/A 04/06/2013   Procedure: COLONOSCOPY;  Surgeon: Daneil Dolin, MD;  Location: [facility];  Service: Endoscopy;  Laterality: N/A;  9:30 AM   ENDOMETRIAL ABLATION     IR ANGIO INTRA EXTRACRAN SEL INTERNAL CAROTID BILAT MOD SED  04/04/2022   IR US GUIDE VASC ACCESS RIGHT  04/04/2022    Assessment / Plan / Recommendation Clinical Impression Patient is a 66 year old right-handed female with history of COPD/tobacco abuse.  Per chart review patient lives with her son.   Independent prior to admission working at a [employer]. Presented 03/29/2022 after being found down with headache as well as vomiting with altered mental status.  CT showed a 5 x 4.5 x 4 cm intraparenchymal hemorrhage centered within the left anterior temporal and frontal lobes.  5 mm left to right midline shift as well as hydrocephalus.   Patient did undergo placement of right frontal ventriculostomy via bur hole 03/29/2022 per Dr. Arnoldo Morale and removed 8/18.    Latest follow-up cranial CT scan 04/12/2022 was stable noting decreased mass effect with decreasing edema around the hemorrhage.  Therapy evaluations completed with recommendations for a comprehensive rehab program. Patient admitted 04/16/22.  Patient demonstrates a severe wernicke's aphasia impacting all four modalities of language. Patient  demonstrates fluent verbal expression that is void of meaning and filled with neologisms without awareness of communication breakdowns despite Max A multimodal cues from SLP. Patient answered yes/no questions and followed 1-step commands in less than 20% of opportunities despite Max A multimodal cues. Patient also unable to decode and read aloud at the word level and unable to perform any basic or functional written expression task. Patient's fluent verbal expression is constant which impacts her overall sustained attention and ability to perform any functional task. However, patient can be redirected intermittently with max verbal and tactile cues. Due to patient's verbosity, patient with poor awareness of bolus and demonstrates prolonged mastication with soft solids resulting in delayed and intermittent coughing. However, per last MBS, coughing can be directly correlated to swallowing function. Max verbal and tactile cues were needed for use of small sips of thin liquids via cup resulting in delayed throat clear X 1. Recommend patient continue current diet of Dys. 1 textures with thin liquids via cup (NO STRAWS) with full supervision to maximize safety. Patient would benefit from continued skilled SLP intervention to maximize her functional communication, swallowing function, and cognitive functioning prior to discharge.    Skilled Therapeutic Interventions          Administered a cognitive-linguistic evaluation and BSE, please see above for details.   SLP Assessment  Patient will need skilled Speech Lanaguage Pathology Services during CIR admission    Recommendations  SLP Diet Recommendations: Dysphagia 1 (Puree);Thin Liquid Administration via: Cup;No straw Medication Administration: Crushed with puree Supervision: Full supervision/cueing for compensatory strategies;Patient able to self feed Compensations: Minimize environmental distractions;Slow rate;Small sips/bites;Follow solids with  liquid Postural Changes and/or Swallow Maneuvers: Seated upright 90 degrees Oral Care Recommendations: Oral care BID Patient destination: Home Follow up Recommendations: Home Health SLP;24 hour supervision/assistance Equipment Recommended: None recommended by SLP    SLP Frequency 3 to 5 out of 7 days   SLP Duration  SLP Intensity  SLP Treatment/Interventions 7-9 days  Minumum of 1-2 x/day, 30 to 90 minutes  Cognitive remediation/compensation;Dysphagia/aspiration precaution training;Internal/external aids;Speech/Language facilitation;Therapeutic Activities;Environmental controls;Cueing hierarchy;Functional tasks;Patient/family education    Pain Pain Assessment Pain Scale: 0-10 Pain Score: 0-No pain  SLP Evaluation Cognition Overall Cognitive Status: Impaired/Different from baseline Arousal/Alertness: Awake/alert Orientation Level: Oriented to person;Disoriented to place;Disoriented to time;Disoriented to situation Focused Attention: Appears intact Sustained Attention: Impaired Sustained Attention Impairment: Verbal basic;Functional basic Awareness: Impaired Awareness Impairment: Emergent impairment Problem Solving: Impaired Problem Solving Impairment: Verbal basic;Functional basic Safety/Judgment: Impaired  Comprehension Auditory Comprehension Overall Auditory Comprehension: Impaired Yes/No Questions: Impaired Basic Biographical Questions: 26-50% accurate Complex Questions: 25-49% accurate Commands: Impaired One Step Basic Commands: 0-24% accurate Conversation: Simple Interfering Components: Attention;Processing speed;Working Curator: Exceptions to [employer] Reading Comprehension Reading Status: Impaired Expression Expression Primary Mode of Expression: Verbal Verbal Expression Overall Verbal Expression: Impaired Initiation: No impairment Level of Generative/Spontaneous Verbalization: Sentence Repetition:  Impaired Level of Impairment: Word level Naming: Impairment Responsive: 0-25% accurate Confrontation: Impaired Convergent: 0-24% accurate Divergent: 0-24% accurate Verbal Errors: Jargon;Neologisms;Not aware of errors Written Expression Dominant Hand: Right Written Expression: Exceptions to [facility] Oral Motor Oral Motor/Sensory Function Overall Oral Motor/Sensory Function: Within functional limits Motor Speech Overall Motor Speech: Appears within functional limits for tasks assessed Respiration: Within functional limits  Care Tool Care Tool Cognition Ability to hear (with hearing aid or hearing appliances if normally used Ability to hear (with hearing aid or hearing appliances if normally used): 1. Minimal difficulty - difficulty in some environments (e.g. when person speaks softly or setting is noisy)   Expression of Ideas and Wants Expression of Ideas and Wants: 2. Frequent difficulty - frequently exhibits difficulty with expressing needs and ideas   Understanding Verbal and Non-Verbal Content Understanding Verbal and Non-Verbal Content: 2. Sometimes understands - understands only basic conversations or simple, direct phrases. Frequently requires cues to understand  Memory/Recall Ability Memory/Recall Ability : That he or she is in a hospital/hospital unit    Bedside Swallowing Assessment General Date of Onset: 04/01/22 Previous Swallow Assessment: MBS on 8/24: Recommended Dys. 1 textures with thin liquids Diet Prior to this Study: Dysphagia 1 (puree);Thin liquids Temperature Spikes Noted: No Respiratory Status: Room air History of Recent Intubation: No Behavior/Cognition: Alert;Cooperative;Pleasant mood;Requires cueing Oral Cavity - Dentition: Adequate natural dentition Self-Feeding Abilities: Able to feed self;Needs assist Patient Positioning: Upright in bed Baseline Vocal Quality: Normal Volitional Cough: Cognitively unable to elicit Volitional Swallow: Unable to elicit   Oral Care Assessment Level of Consciousness: Alert Thin Liquid Thin Liquid: Impaired Presentation: Cup Pharyngeal  Phase Impairments: Throat Clearing - Delayed Nectar Thick Nectar Thick Liquid: Not tested Honey Thick Honey Thick Liquid: Not tested Puree Puree: Within functional limits Presentation: Spoon Solid Solid: Impaired Presentation: Spoon Oral Phase Impairments: Impaired mastication;Poor awareness of bolus BSE Assessment Risk for Aspiration Impact on safety and function: Mild aspiration risk Other Related Risk Factors: Cognitive impairment  Short Term Goals: Week 1: SLP Short Term Goal 1 (Week 1): STGs=LTGs due to ELOS  Refer to Care Plan for Long Term Goals  Recommendations for other services: None   Discharge Criteria: Patient will be discharged from SLP if patient refuses treatment 3 consecutive  times without medical reason, if treatment goals not met, if there is a change in medical status, if patient makes no progress towards goals or if patient is discharged from hospital.  The above assessment, treatment plan, treatment alternatives and goals were discussed and mutually agreed upon: by family  Norman Piacentini 04/17/2022, 3:25 PM

## 2022-04-17 NOTE — Progress Notes (Signed)
Patient ID: [name] Giles, female   DOB: 10/11/1955, 66 y.o.   MRN: [id number] Met with pt's sister who was visiting to inform team will recommend 24/7 supervision for safety and will have a team conference tomorrow. She reports her son will be here tomorrow he has a day off. Will get FMLA forms since the ones she has does not have the medical piece of it. Follow up with son tomorrow.

## 2022-04-17 NOTE — Progress Notes (Signed)
[facility] Individual Statement of Services  Patient Name:  DESIRE FULP  Date:  04/17/2022  Welcome to the [facility].  Our goal is to provide you with an individualized program based on your diagnosis and situation, designed to meet your specific needs.  With this comprehensive rehabilitation program, you will be expected to participate in at least 3 hours of rehabilitation therapies Monday-Friday, with modified therapy programming on the weekends.  Your rehabilitation program will include the following services:  Physical Therapy (PT), Occupational Therapy (OT), Speech Therapy (ST), 24 hour per day rehabilitation nursing, Therapeutic Recreaction (TR), Neuropsychology, Care Coordinator, Rehabilitation Medicine, Nutrition Services, and Pharmacy Services  Weekly team conferences will be held on Wednesday to discuss your progress.  Your Inpatient Rehabilitation Care Coordinator will talk with you frequently to get your input and to update you on team discussions.  Team conferences with you and your family in attendance may also be held.  Expected length of stay: 7-9 days  Overall anticipated outcome: supervision level  Depending on your progress and recovery, your program may change. Your Inpatient Rehabilitation Care Coordinator will coordinate services and will keep you informed of any changes. Your Inpatient Rehabilitation Care Coordinator's name and contact numbers are listed  below.  The following services may also be recommended but are not provided by the [facility] will be made to provide these services after discharge if needed.  Arrangements include referral to agencies that provide these services.  Your insurance has been verified to be:  [facility] Your primary doctor is:  Ecologist  Pertinent information will be shared with your doctor and your insurance company.  Inpatient Rehabilitation Care Coordinator:  Ovidio Kin, [place] or Emilia Beck  Information discussed with and copy given to patient by: Elease Hashimoto, 04/17/2022, 2:01 PM

## 2022-04-17 NOTE — Evaluation (Signed)
Occupational Therapy Assessment and Plan  Patient Details  Name: [name] Giles MRN: [id number] Date of Birth: September 06, 1955  OT Diagnosis: apraxia and cognitive deficits Rehab Potential: Rehab Potential (ACUTE ONLY): Good ELOS: 7 - 9 days   Today's Date: 04/17/2022 OT Individual Time: 2751-7001 OT Individual Time Calculation (min): 85 min     Hospital Problem: Principal Problem:   ICH (intracerebral hemorrhage) ([place])   Past Medical History:  Past Medical History:  Diagnosis Date   Asthma    Medical history non-contributory    Past Surgical History:  Past Surgical History:  Procedure Laterality Date   COLONOSCOPY     COLONOSCOPY N/A 04/06/2013   Procedure: COLONOSCOPY;  Surgeon: Daneil Dolin, MD;  Location: [facility];  Service: Endoscopy;  Laterality: N/A;  9:30 AM   ENDOMETRIAL ABLATION     IR ANGIO INTRA EXTRACRAN SEL INTERNAL CAROTID BILAT MOD SED  04/04/2022   IR US GUIDE VASC ACCESS RIGHT  04/04/2022    Assessment & Plan Clinical Impression: Patient is a 66 y.o. year old right-handed female with history of COPD/tobacco abuse.  Per chart review patient lives with her son.  1 level home 4 steps to enter.  Independent prior to admission working at a [employer].  Presented 03/29/2022 after being found down.  With headache as well as vomiting with altered mental status.  CT showed a 5 x 4.5 x 4 cm intraparenchymal hemorrhage centered within the left anterior temporal and frontal lobes.  5 mm left to right midline shift as well as hydrocephalus.  CT angiogram head and neck no evidence of dural venous sinus thrombosis.  No large vessel occlusion.  Admission chemistries unremarkable, CK 61, troponin negative, ammonia level 20, urine drug screen negative.  Echocardiogram with ejection fraction of 60 to 65% no wall motion abnormalities.  Initially maintained on 3% hypertonic saline.  Patient did undergo placement of right frontal ventriculostomy via bur hole 03/29/2022 per Dr.  Arnoldo Morale and removed 8/18.  Attempts at cerebral angiogram aborted due to primarily significant patient motion.  Latest follow-up cranial CT scan 04/12/2022 was stable noting decreased mass effect with decreasing edema around the hemorrhage.  She was cleared to begin subcutaneous heparin for DVT prophylaxis 04/01/2022.  Patient course delirium initially on Precedex and currently maintained on Seroquel.  Bouts of urinary retention placed on low-dose Urecholine.  Patient spiked a low-grade fever 102.5 with leukocytosis 18,000-15700 and monitored improved to 10,000.  Chest x-ray showed no signs of edema or pulmonary consolidation.  Urinalysis negative nitrite.  Blood cultures no growth to date.  Placed on empiric Rocephin as well as vancomycin transitioned to Unasyn 04/16/2022 x 3 days and stop.  Diet currently dysphagia 1 thin liquids. Patient transferred to CIR on 04/16/2022 .    Patient currently requires min with basic self-care skills secondary to impaired timing and sequencing, motor apraxia, decreased coordination, and decreased motor planning.  Prior to hospitalization, patient could complete ADLs/IADLs independently.  Patient will benefit from skilled intervention to decrease level of assist with basic self-care skills and increase independence with basic self-care skills prior to discharge home with care partner.  Anticipate patient will require 24 hour supervision and follow up home health.  OT - End of Session Activity Tolerance: Tolerates 30+ min activity with multiple rests Endurance Deficit: Yes Endurance Deficit Description: seated rest breaks during ADLs OT Assessment Rehab Potential (ACUTE ONLY): Good OT Patient demonstrates impairments in the following area(s): Balance;Vision;Perception;Cognition;Safety;Sensory;Endurance;Motor OT Basic ADL's Functional Problem(s): Eating;Grooming;Bathing;Dressing;Toileting OT Advanced  ADL's Functional Problem(s): Simple Meal Preparation OT Transfers  Functional Problem(s): Toilet;Tub/Shower OT Additional Impairment(s): Fuctional Use of Upper Extremity OT Plan OT Intensity: Minimum of 1-2 x/day, 45 to 90 minutes OT Frequency: 5 out of 7 days OT Duration/Estimated Length of Stay: 7 - 9 days OT Self Feeding Anticipated Outcome(s): set-up assist OT Basic Self-Care Anticipated Outcome(s): set-up for seated tasks & supervision for standing tasks OT Toileting Anticipated Outcome(s): supervision OT Bathroom Transfers Anticipated Outcome(s): supervision OT Recommendation Recommendations for Other Services:  (None) Patient destination: Home Follow Up Recommendations: Home health OT Equipment Recommended: To be determined;Tub/shower bench Equipment Details: Pending availability to talk to family to ensure home set-up and equipment   OT Evaluation Precautions/Restrictions  Precautions Precautions: None Precaution Comments: expressive aphasia Restrictions Weight Bearing Restrictions: No General Chart Reviewed: Yes Family/Caregiver Present: No Vital Signs Please see "Flowsheet" for most recent vitals charted by nursing staff.  Pain Pain Assessment Pain Scale: 0-10 Pain Score: 0-No pain Home Living/Prior Functioning Home Living Family/patient expects to be discharged to:: Private residence Living Arrangements: Children Available Help at Discharge: Family, Available PRN/intermittently (potentially other family members to assist while son works) Type of Home: House Home Access: Stairs to enter Technical brewer of Steps: 4 Entrance Stairs-Rails: Right, Left Home Layout: One level Bathroom Shower/Tub: Tub/shower unit, Door (potentially a sliding door) Biochemist, clinical: Standard (Pt unable to recall) Additional Comments: Per chart review - thresholds/small changes in height throughout the house  Lives With: Son IADL History Homemaking Responsibilities:  (Pt reports that son can provide assist with IADLs while pt recovers. Need  to clarify with family pt's IADLs PTA) Current License: Yes Mode of Transportation: Public relations account executive: per chart review - college Occupation: Full time employment Type of Occupation: Patent examiner Leisure and Hobbies: Pt unable to verbalize d/t expressive aphasia IADL Comments: Difficulty gathering all details regarding IADLs d/t pt's expressive aphasia. Pt is a poor historian d/t communication difficulties. Prior Function Level of Independence: Independent with basic ADLs, Independent with homemaking with ambulation, Independent with gait, Independent with transfers (Pt responds "yes" to using walker, however, not indicated during chart review.)  Able to Take Stairs?: Yes Driving: Yes Vocation: Full time employment Vision Baseline Vision/History: 1 Wears glasses (reading glasses - pt demo's difficulty communicating d/t communication deficits) Ability to See in Adequate Light: 0 Adequate Patient Visual Report: No change from baseline (Needs further testing) Vision Assessment?: Vision impaired- to be further tested in functional context Additional Comments: No deficits noted during OT session, however, needs further assessment. Perception  Perception: Impaired Praxis Praxis: Impaired Praxis Impairment Details: Motor planning;Initiation Cognition Cognition Overall Cognitive Status: Difficult to assess (due to aphasia) Arousal/Alertness: Awake/alert Orientation Level:  (difficult to assess d/t communication deficits) Memory: Impaired (difficult to assess d/t expressive aphasia) Attention: Focused;Sustained Focused Attention: Appears intact Focused Attention Impairment: Verbal basic Sustained Attention: Impaired Sustained Attention Impairment: Verbal basic;Functional basic Awareness: Impaired Awareness Impairment: Emergent impairment Problem Solving: Impaired Problem Solving Impairment: Verbal basic;Functional basic Executive Function:  (impaired) Behaviors:  (No behaviors  noted) Safety/Judgment: Impaired Brief Interview for Mental Status (BIMS) Repetition of Three Words (First Attempt): 2 Temporal Orientation: Year: Nonsensical Temporal Orientation: Month: Nonsensical Temporal Orientation: Day: Nonsensical Recall: "Sock": Nonsensical Recall: "Blue": Nonsensical Recall: "Bed": Nonsensical BIMS Summary Score: 99 Sensation Sensation Light Touch: Not tested Additional Comments: No sensations noted during OT session and difficult to assess secondary to expressive aphasia. Will continue to assess in future sessions. Coordination Gross Motor Movements are Fluid and Coordinated: No Fine Motor Movements  are Fluid and Coordinated: No Coordination and Movement Description: difficult to assess due to expressive/receptive aphasia, however, pt demo's functional coordination to complete ADLs with minimal assist more for balance. 9 Hole Peg Test: TBD - difficult to perform at this time d/t aphasia. Motor  Motor Motor: Motor apraxia Motor - Skilled Clinical Observations: decreased awareness with mobility and requires moderate verbal and tactile prompts for initiation during ADLs  Trunk/Postural Assessment  Cervical Assessment Cervical Assessment: Within Functional Limits Thoracic Assessment Thoracic Assessment: Within Functional Limits Lumbar Assessment Lumbar Assessment: Within Functional Limits Postural Control Postural Control: Within Functional Limits Righting Reactions: delayed  Balance Balance Balance Assessed: Yes Static Sitting Balance Static Sitting - Balance Support: Feet supported;No upper extremity supported Static Sitting - Level of Assistance: 5: Stand by assistance Dynamic Sitting Balance Dynamic Sitting - Balance Support: Feet supported;No upper extremity supported Dynamic Sitting - Level of Assistance: 5: Stand by assistance Sitting balance - Comments: good sitting balance on various surfaces while completing ADLs, no LOB note din  sitting Static Standing Balance Static Standing - Balance Support: During functional activity (FWW) Static Standing - Level of Assistance: 4: Min assist Dynamic Standing Balance Dynamic Standing - Balance Support: During functional activity (FWW) Dynamic Standing - Level of Assistance: 4: Min assist Dynamic Standing - Balance Activities:  (ADLs) Extremity/Trunk Assessment RUE Assessment RUE Assessment: Within Functional Limits (grossly functional during ADLs - needs further testing, unable to complete d/t time constraint) LUE Assessment LUE Assessment: Within Functional Limits (grossly functional during ADLs - needs further testing, unable to complete d/t time constraint)  Care Tool Care Tool Self Care Eating   Eating Assist Level: Supervision/Verbal cueing    Oral Care    Oral Care Assist Level: Supervision/Verbal cueing    Bathing   Body parts bathed by patient: Right arm;Left arm;Abdomen;Chest;Front perineal area;Right upper leg;Left upper leg;Right lower leg;Left lower leg;Face Body parts bathed by helper: Buttocks   Assist Level: Minimal Assistance - Patient > 75%    Upper Body Dressing(including orthotics)       Assist Level: Contact Guard/Touching assist    Lower Body Dressing (excluding footwear)   What is the patient wearing?: Underwear/pull up;Pants Assist for lower body dressing: Minimal Assistance - Patient > 75%    Putting on/Taking off footwear   What is the patient wearing?: Socks;Shoes Assist for footwear: Moderate Assistance - Patient 50 - 74%       Care Tool Toileting Toileting activity   Assist for toileting: Minimal Assistance - Patient > 75%     Care Tool Bed Mobility Roll left and right activity   Roll left and right assist level: Contact Guard/Touching assist    Sit to lying activity  Sit to lying assist level: Supervision/Verbal cueing    Lying to sitting on side of bed activity  Lying to sitting on side of bed assist level: the ability  to move from lying on the back to sitting on the side of the bed with no back support.: Supervision/Verbal cueing     Care Tool Transfers Sit to stand transfer  Sit to stand assist level: Minimal Assistance - Patient > 75%    Chair/bed transfer  Chair/bed transfer assist level: Minimal Assistance - Patient > 75%     Toilet transfer  Assist Level: Minimal Assistance - Patient > 75%     Care Tool Cognition  Expression of Ideas and Wants Expression of Ideas and Wants: 2. Frequent difficulty - frequently exhibits difficulty with expressing needs and ideas  Understanding Verbal and Non-Verbal Content Understanding Verbal and Non-Verbal Content: 3. Usually understands - understands most conversations, but misses some part/intent of message. Requires cues at times to understand   Memory/Recall Ability Memory/Recall Ability : That he or she is in a hospital/hospital unit   Refer to Care Plan for [employer] [facility] OT Short Term Goal 1 (Week 1): STG = LTG d/t ELOS  Recommendations for other services: None    Skilled Therapeutic Intervention Pt asleep in bed upon OT arrival to the room. Pt reports, "Thank you." Pt demo's nonsensical verbalizations throughout majority of session d/t expressive aphasia. Pt in agreement for OT session.  ADL ADL Grooming: Supervision/safety (Pt able to perform oral care with supervision while seated in the w/c at the sink.) Where Assessed-Grooming: Sitting at sink;Wheelchair Upper Body Bathing: Supervision/safety;Moderate cueing (Pt able to bathe all UB parts while seated in the tub-bench in the shower with moderate VCs for sequencing and thoroughness.) Where Assessed-Upper Body Bathing: Shower Lower Body Bathing: Moderate cueing;Minimal assistance (Pt able to bathe 4/5 body parts while seated on the tub-bench in the shower with moderate VCs for sequencing and thoroughness. Pt able to bathe front peri-area while seated, however, requires  assistance to bathe buttocks and CGA & grab bars to stand) Where Assessed-Lower Body Bathing: Shower Upper Body Dressing: Contact guard (Pt able to don fastening bra and pull-over style shirt with incidental touching for clothing adjustments while seated on BSC after a shower.) Where Assessed-Upper Body Dressing:  (seated on BSC) Lower Body Dressing: Minimal assistance (Pt able to participate in all aspects to don briefs and shorts while seated and standing with FWW for safety. Pt requires minimal assistance to fully pull clothing up over buttocks with CGA to ensure standing balance.) Where Assessed-Lower Body Dressing:  (seated on BSC) Toileting: Contact guard (Pt able to perform front peri-hygiene after toileting, however, would require CGA for standing balance during clothing management.) Where Assessed-Toileting: Glass blower/designer: Minimal assistance;Moderate verbal cueing (Pt able to complete ambulatory transfer from EOB > toilet with minimal assistance and moderate VCs for safe walker management with use of FWW.) Toilet Transfer Method: Ambulating Toilet Transfer Equipment: Energy manager: Moderate cueing;Minimal assistance (Pt able to perform ambulatory transfer from toilet > tub-bench in the shower with minimal assistance and moderate VCs for safety with use of FWW and grab bars for safety.) Social research officer, government Method: Ambulating Walk-In Shower Equipment: Grab bars;Transfer tub bench ADL Comments: Pt participates well in ADL routine, however, pt requires overall moderate prompts for techniques, sequencing, and safety during ADLs and mobility. Pt demo's minimal fatigue by taking breaks during seated ADLs. Pt demo's some indicators of receptive aphasia d/t requiring tactile prompts to initiate portions of task paired with VCs. Pt able to complete various ambulatory transfers from EOB > toilet > tub-bench > BSC > w/c with minimal assistance with use of FWW. Mobility   Bed Mobility Supine to Sit: Supervision/Verbal cueing Transfers Sit to Stand: Minimal Assistance - Patient > 75% Stand to Sit: Minimal Assistance - Patient > 75%  Pt requested to stay in the w/c at end of session. Pt left sitting comfortably in the w/c with personal belongings and call light within reach, belt alarm placed and activated, and comfort needs attended to.    Discharge Criteria: Patient will be discharged from OT if patient refuses treatment 3 consecutive times without medical reason, if treatment goals not met, if there is a change in  medical status, if patient makes no progress towards goals or if patient is discharged from hospital.  The above assessment, treatment plan, treatment alternatives and goals were discussed and mutually agreed upon: by patient  Barbee Shropshire 04/17/2022, 1:22 PM

## 2022-04-17 NOTE — Progress Notes (Signed)
   04/17/22 1318  What Happened  Was fall witnessed? No  Was patient injured? Unsure  Patient found on floor  Found by Staff-comment (Visitor found patient on floor)  Stated prior activity other (comment) (patient appeared to have taken the safety belt off and then sat in the floor.)  Follow Up  MD notified Dr. Ranell Patrick  Time MD notified 23  Family notified Yes - comment (Visitor in room)  Time family notified 1300  Additional tests Yes-comment (Head CT)  Progress note created (see row info) Yes  Adult Fall Risk Assessment  Risk Factor Category (scoring not indicated) Fall has occurred during this admission (document High fall risk)  Patient Fall Risk Level High fall risk  Adult Fall Risk Interventions  Required Bundle Interventions *See Row Information* High fall risk - low, moderate, and high requirements implemented  Additional Interventions Camera surveillance (with patient/family notification & education);Lap belt while in chair/wheelchair (Rehab only);Pharmacy review of medications;PT/OT need assessed if change in mobility from baseline;Use of appropriate toileting equipment (bedpan, BSC, etc.)  Screening for Fall Injury Risk (To be completed on HIGH fall risk patients) - Assessing Need for Floor Mats  Risk For Fall Injury- Criteria for Floor Mats Admitted as a result of a fall;Confusion/dementia (+NuDESC, CIWA, TBI, etc.)  Will Implement Floor Mats Yes  Vitals  Temp 97.8 F (36.6 C)  Temp Source Oral  BP 130/60  MAP (mmHg) 80  BP Location Left Arm  BP Method Automatic  Patient Position (if appropriate) Lying  Pulse Rate 92  Pulse Rate Source Monitor  Resp 20  Oxygen Therapy  SpO2 97 %  O2 Device Room Air  Pain Assessment  Pain Scale 0-10  Pain Score 0  Neurological  Neuro (WDL) X  Level of Consciousness Alert  Orientation Level Oriented to person;Disoriented to place;Disoriented to time;Disoriented to situation  Cognition Poor attention/concentration;Poor  judgement;Poor safety awareness;Impulsive;Memory impairment  Speech Clear  R Hand Grip Moderate  L Hand Grip Moderate  R Foot Dorsiflexion Moderate  L Foot Dorsiflexion Moderate  RUE Motor Response Purposeful movement  RUE Sensation Full sensation  RUE Motor Strength 5  LUE Motor Response Purposeful movement  LUE Sensation Full sensation  LUE Motor Strength 5  RLE Motor Response Purposeful movement  RLE Sensation Full sensation  RLE Motor Strength 5  LLE Motor Response Purposeful movement  LLE Sensation Full sensation  LLE Motor Strength 5  Neuro Symptoms Forgetful  Musculoskeletal  Musculoskeletal (WDL) X  Assistive Device Wheelchair  Generalized Weakness Yes  Weight Bearing Restrictions No  Integumentary  Integumentary (WDL) X  Skin Color Appropriate for ethnicity  Skin Condition Dry  Skin Integrity Other (Comment) (incision to head)  Skin Turgor Non-tenting

## 2022-04-17 NOTE — Plan of Care (Signed)
  Problem: RH Balance Goal: LTG Patient will maintain dynamic standing balance (PT) Description: LTG:  Patient will maintain dynamic standing balance with assistance during mobility activities (PT) Flowsheets (Taken 04/17/2022 1131) LTG: Pt will maintain dynamic standing balance during mobility activities with:: Supervision/Verbal cueing   Problem: Sit to Stand Goal: LTG:  Patient will perform sit to stand with assistance level (PT) Description: LTG:  Patient will perform sit to stand with assistance level (PT) Flowsheets (Taken 04/17/2022 1131) LTG: PT will perform sit to stand in preparation for functional mobility with assistance level: Supervision/Verbal cueing   Problem: RH Bed Mobility Goal: LTG Patient will perform bed mobility with assist (PT) Description: LTG: Patient will perform bed mobility with assistance, with/without cues (PT). Flowsheets (Taken 04/17/2022 1131) LTG: Pt will perform bed mobility with assistance level of: Supervision/Verbal cueing   Problem: RH Bed to Chair Transfers Goal: LTG Patient will perform bed/chair transfers w/assist (PT) Description: LTG: Patient will perform bed to chair transfers with assistance (PT). Flowsheets (Taken 04/17/2022 1131) LTG: Pt will perform Bed to Chair Transfers with assistance level: Supervision/Verbal cueing   Problem: RH Car Transfers Goal: LTG Patient will perform car transfers with assist (PT) Description: LTG: Patient will perform car transfers with assistance (PT). Flowsheets (Taken 04/17/2022 1131) LTG: Pt will perform car transfers with assist:: Contact Guard/Touching assist   Problem: RH Ambulation Goal: LTG Patient will ambulate in controlled environment (PT) Description: LTG: Patient will ambulate in a controlled environment, # of feet with assistance (PT). Flowsheets (Taken 04/17/2022 1131) LTG: Pt will ambulate in controlled environ  assist needed:: Supervision/Verbal cueing LTG: Ambulation distance in controlled  environment: 150f Goal: LTG Patient will ambulate in home environment (PT) Description: LTG: Patient will ambulate in home environment, # of feet with assistance (PT). Flowsheets (Taken 04/17/2022 1131) LTG: Pt will ambulate in home environ  assist needed:: Supervision/Verbal cueing LTG: Ambulation distance in home environment: 570f  Problem: RH Stairs Goal: LTG Patient will ambulate up and down stairs w/assist (PT) Description: LTG: Patient will ambulate up and down # of stairs with assistance (PT) Flowsheets (Taken 04/17/2022 1131) LTG: Pt will ambulate up/down stairs assist needed:: Contact Guard/Touching assist LTG: Pt will  ambulate up and down number of stairs: 4 or as per home setup

## 2022-04-17 NOTE — Plan of Care (Signed)
  Problem: RH Swallowing Goal: LTG Patient will consume least restrictive diet using compensatory strategies with assistance (SLP) Description: LTG:  Patient will consume least restrictive diet using compensatory strategies with assistance (SLP) Flowsheets (Taken 04/17/2022 1540) LTG: Pt Patient will consume least restrictive diet using compensatory strategies with assistance of (SLP): Minimal Assistance - Patient > 75% Goal: LTG Patient will participate in dysphagia therapy to increase swallow function with assistance (SLP) Description: LTG:  Patient will participate in dysphagia therapy to increase swallow function with assistance (SLP) Flowsheets (Taken 04/17/2022 1540) LTG: Pt will participate in dysphagia therapy to increase swallow function with assistance of (SLP): Minimal Assistance - Patient > 75% Goal: LTG Pt will demonstrate functional change in swallow as evidenced by bedside/clinical objective assessment (SLP) Description: LTG: Patient will demonstrate functional change in swallow as evidenced by bedside/clinical objective assessment (SLP) Flowsheets (Taken 04/17/2022 1540) LTG: Patient will demonstrate functional change in swallow as evidenced by bedside/clinical objective assessment: Oral swallow   Problem: RH Expression Communication Goal: LTG Patient will express needs/wants via multi-modal(SLP) Description: LTG:  Patient will express needs/wants via multi-modal communication (gestures/written, etc) with cues (SLP) Flowsheets (Taken 04/17/2022 1540) LTG: Patient will express needs/wants via multimodal communication (gestures/written, etc) with cueing (SLP): Moderate Assistance - Patient 50 - 74% Goal: LTG Patient will verbally express basic/complex needs(SLP) Description: LTG:  Patient will verbally express basic/complex needs, wants or ideas with cues  (SLP) Flowsheets (Taken 04/17/2022 1540) LTG: Patient will verbally express basic/complex needs, wants or ideas (SLP): Maximal  Assistance - Patient 25 - 49%   Problem: RH Attention Goal: LTG Patient will demonstrate this level of attention during functional activites (SLP) Description: LTG:  Patient will will demonstrate this level of attention during functional activites (SLP) Flowsheets (Taken 04/17/2022 1540) Patient will demonstrate during cognitive/linguistic activities the attention type of: Sustained Patient will demonstrate this level of attention during cognitive/linguistic activities in: Controlled LTG: Patient will demonstrate this level of attention during cognitive/linguistic activities with assistance of (SLP): Moderate Assistance - Patient 50 - 74%

## 2022-04-17 NOTE — Progress Notes (Addendum)
Inpatient Rehabilitation Admission Medication Review by a Pharmacist  A complete drug regimen review was completed for this patient to identify any potential clinically significant medication issues.  High Risk Drug Classes Is patient taking? Indication by Medication  Antipsychotic Yes Seroquel - mood,agitation  Anticoagulant Yes Sq heparin - VTE ppx  Antibiotic Yes, as an intravenous medication Unasyn - pneumonia, 3 days  Opioid Yes Oxycodone - prn pain  Antiplatelet No   Hypoglycemics/insulin No   Vasoactive Medication Yes Amlodipine - BP   Chemotherapy No   Other Yes Bethanechol - urinary retention  Budesonide, Garlon Hatchet, Yupelri nebs - COPD  Rosuvastatin - HLD     Type of Medication Issue Identified Description of Issue Recommendation(s)  Drug Interaction(s) (clinically significant)     Duplicate Therapy     Allergy     No Medication Administration End Date     Incorrect Dose     Additional Drug Therapy Needed     Significant med changes from prior encounter (inform family/care partners about these prior to discharge).    Other       Clinically significant medication issues were identified that warrant physician communication and completion of prescribed/recommended actions by midnight of the next day:  No  Pharmacist comments: None  Time spent performing this drug regimen review (minutes):  30 minutes  Thank you Anette Guarneri, PharmD

## 2022-04-17 NOTE — Evaluation (Signed)
Physical Therapy Assessment and Plan  Patient Details  Name: [name] Giles MRN: [id number] Date of Birth: August 17, 1956  PT Diagnosis: Abnormality of gait, Cognitive deficits, Difficulty walking, Impaired cognition, and Muscle weakness Rehab Potential: Good ELOS: 7-9 days   Today's Date: 04/17/2022 PT Individual Time: 7353-2992 PT Individual Time Calculation (min): 71 min    Hospital Problem: Principal Problem:   ICH (intracerebral hemorrhage) ([name] Giles)   Past Medical History:  Past Medical History:  Diagnosis Date   Asthma    Medical history non-contributory    Past Surgical History:  Past Surgical History:  Procedure Laterality Date   COLONOSCOPY     COLONOSCOPY N/A 04/06/2013   Procedure: COLONOSCOPY;  Surgeon: Daneil Dolin, MD;  [facility];  Service: Endoscopy;  Laterality: N/A;  9:30 AM   ENDOMETRIAL ABLATION     IR ANGIO INTRA EXTRACRAN SEL INTERNAL CAROTID BILAT MOD SED  04/04/2022   IR US GUIDE VASC ACCESS RIGHT  04/04/2022    Assessment & Plan Clinical Impression: Patient is a 66 year old right-handed female with history of COPD/tobacco abuse.  Per chart review patient lives with her son.  1 level home 4 steps to enter.  Independent prior to admission working at a [employer].  Presented 03/29/2022 after being found down.  With headache as well as vomiting with altered mental status.  CT showed a 5 x 4.5 x 4 cm intraparenchymal hemorrhage centered within the left anterior temporal and frontal lobes.  5 mm left to right midline shift as well as hydrocephalus.  CT angiogram head and neck no evidence of dural venous sinus thrombosis.  No large vessel occlusion.  Admission chemistries unremarkable, CK 61, troponin negative, ammonia level 20, urine drug screen negative.  Echocardiogram with ejection fraction of 60 to 65% no wall motion abnormalities.  Initially maintained on 3% hypertonic saline.  Patient did undergo placement of right frontal ventriculostomy via  bur hole 03/29/2022 per Dr. Arnoldo Morale and removed 8/18.  Attempts at cerebral angiogram aborted due to primarily significant patient motion.  Latest follow-up cranial CT scan 04/12/2022 was stable noting decreased mass effect with decreasing edema around the hemorrhage.  She was cleared to begin subcutaneous heparin for DVT prophylaxis 04/01/2022.  Patient course delirium initially on Precedex and currently maintained on Seroquel.  Bouts of urinary retention placed on low-dose Urecholine.  Patient spiked a low-grade fever 102.5 with leukocytosis 18,000-15700 and monitored improved to 10,000.  Chest x-ray showed no signs of edema or pulmonary consolidation.  Urinalysis negative nitrite.  Blood cultures no growth to date.  Placed on empiric Rocephin as well as vancomycin transitioned to Unasyn 04/16/2022 x 3 days and stop.  Diet currently dysphagia 1 thin liquids.  Therapy evaluations completed due to patient decreased functional mobility was admitted for a comprehensive rehab program.Patient transferred to CIR on 04/16/2022 .   Patient currently requires min with mobility secondary to muscle weakness, motor apraxia, decreased awareness, decreased problem solving, and decreased safety awareness, and decreased standing balance and decreased balance strategies.  Prior to hospitalization, patient was independent  with mobility and lived with Son in a House home.  Home access is 4Stairs to enter.  Patient will benefit from skilled PT intervention to maximize safe functional mobility, minimize fall risk, and decrease caregiver burden for planned discharge home with 24 hour supervision.  Anticipate patient will benefit from follow up OP at discharge.  PT - End of Session Activity Tolerance: Tolerates 10 - 20 min activity with multiple rests Endurance  Deficit: Yes Endurance Deficit Description: Seated rest break b/w functional mobility tasks PT Assessment Rehab Potential (ACUTE/IP ONLY): Good PT Barriers to Discharge:  Decreased caregiver support;Lack of/limited family support;Insurance for SNF coverage PT Patient demonstrates impairments in the following area(s): Balance;Endurance;Motor;Safety PT Transfers Functional Problem(s): Bed Mobility;Bed to Chair;Car PT Locomotion Functional Problem(s): Ambulation;Stairs PT Plan PT Intensity: Minimum of 1-2 x/day ,45 to 90 minutes PT Frequency: 5 out of 7 days PT Duration Estimated Length of Stay: 7-9 days PT Treatment/Interventions: Ambulation/gait training;Discharge planning;Functional mobility training;Psychosocial support;Therapeutic Activities;Visual/perceptual remediation/compensation;Therapeutic Exercise;Skin care/wound management;Disease management/prevention;Neuromuscular re-education;Balance/vestibular training;Cognitive remediation/compensation;DME/adaptive equipment instruction;Pain management;Splinting/orthotics;UE/LE Coordination activities;Stair training;UE/LE Strength taining/ROM;Patient/family education;Functional electrical stimulation;Community reintegration PT Transfers Anticipated Outcome(s): supervision PT Locomotion Anticipated Outcome(s): supervision PT Recommendation Follow Up Recommendations: 24 hour supervision/assistance;Outpatient PT Patient destination: Home Equipment Recommended: To be determined   PT Evaluation Precautions/Restrictions Precautions Precautions: Fall Precaution Comments: expressive > receptive aphasia Restrictions Weight Bearing Restrictions: No Pain Interference Pain Interference Pain Effect on Sleep: 8. Unable to answer Pain Interference with Therapy Activities: 8. Unable to answer Pain Interference with Day-to-Day Activities: 8. Unable to answer Home Living/Prior Functioning Home Living Available Help at Discharge: Family;Available PRN/intermittently Type of Home: House Home Access: Stairs to enter [employer] of Steps: 4 Entrance Stairs-Rails: Right;Left Home Layout: One level Bathroom  Shower/Tub: Tub/shower unit;Door [employer] Toilet: Standard Additional Comments: Per chart review - thresholds/small changes in height throughout the house  Lives With: Son Prior Function Level of Independence: Independent with basic ADLs;Independent with homemaking with ambulation;Independent with gait;Independent with transfers (unable to gather PLOF and social factors 2/2 aphasia)  Able to Take Stairs?: Yes Driving: Yes Vocation: Full time employment Vocation Requirements: Patent examiner Vision/Perception  Vision - History Ability to See in Adequate Light: 0 Adequate Perception Perception: Impaired Praxis Praxis: Impaired Praxis Impairment Details: Motor planning  Cognition Overall Cognitive Status: Difficult to assess (due to aphasia) Arousal/Alertness: Awake/alert Orientation Level: Oriented to person Attention: Focused;Sustained Focused Attention: Appears intact Sustained Attention: Impaired Sustained Attention Impairment: Verbal basic;Functional basic Awareness: Impaired Awareness Impairment: Emergent impairment Problem Solving: Impaired Problem Solving Impairment: Verbal basic;Functional basic Safety/Judgment: Impaired Sensation Sensation Light Touch: Not tested Additional Comments: Unable to formally assess due to aphasia. Functionally no sensory deficits noted but will continue to assess Coordination Gross Motor Movements are Fluid and Coordinated: No Fine Motor Movements are Fluid and Coordinated: No Motor  Motor Motor: Motor apraxia Motor - Skilled Clinical Observations: Delayed motor responses and decreased awareness with mobilty   Trunk/Postural Assessment  Cervical Assessment Cervical Assessment: Within Functional Limits Thoracic Assessment Thoracic Assessment: Within Functional Limits Lumbar Assessment Lumbar Assessment: Within Functional Limits Postural Control Postural Control: Deficits on evaluation Righting Reactions: delayed   Balance Balance Balance Assessed: Yes Static Sitting Balance Static Sitting - Balance Support: Feet supported;No upper extremity supported Static Sitting - Level of Assistance: 5: Stand by assistance Dynamic Sitting Balance Dynamic Sitting - Balance Support: Feet supported;No upper extremity supported Dynamic Sitting - Level of Assistance: 5: Stand by assistance (CGA) Static Standing Balance Static Standing - Balance Support: No upper extremity supported Static Standing - Level of Assistance: 4: Min assist Dynamic Standing Balance Dynamic Standing - Balance Support: No upper extremity supported;During functional activity Dynamic Standing - Level of Assistance: 3: Mod assist;4: Min assist Dynamic Standing - Balance Activities: Reaching for objects;Reaching across midline;Lateral lean/weight shifting Extremity Assessment      RLE Assessment RLE Assessment: Exceptions to [facility] General Strength Comments: Grossly 4/5 LLE Assessment LLE Assessment: Exceptions to [facility] General Strength Comments: Grossly 4/5  Care Tool Care Tool Bed Mobility Roll left and right activity   Roll left and right assist level: Contact Guard/Touching assist    Sit to lying activity   Sit to lying assist level: Supervision/Verbal cueing    Lying to sitting on side of bed activity   Lying to sitting on side of bed assist level: the ability to move from lying on the back to sitting on the side of the bed with no back support.: Supervision/Verbal cueing     Care Tool Transfers Sit to stand transfer   Sit to stand assist level: Minimal Assistance - Patient > 75%    Chair/bed transfer   Chair/bed transfer assist level: Minimal Assistance - Patient > 75%     Toilet transfer   Assist Level: Minimal Assistance - Patient > 75%    Car transfer   Car transfer assist level: Minimal Assistance - Patient > 75%      Care Tool Locomotion Ambulation   Assist level: Minimal Assistance - Patient > 75% Assistive  device: Hand held assist Max distance: 186f  Walk 10 feet activity   Assist level: Minimal Assistance - Patient > 75% Assistive device: Hand held assist   Walk 50 feet with 2 turns activity   Assist level: Minimal Assistance - Patient > 75% Assistive device: Hand held assist  Walk 150 feet activity   Assist level: Minimal Assistance - Patient > 75% Assistive device: Hand held assist  Walk 10 feet on uneven surfaces activity   Assist level: Minimal Assistance - Patient > 75% Assistive device: Walker-rolling  Stairs   Assist level: Minimal Assistance - Patient > 75% Stairs assistive device: 2 hand rails Max number of stairs: 12  Walk up/down 1 step activity   Walk up/down 1 step (curb) assist level: Minimal Assistance - Patient > 75% Walk up/down 1 step or curb assistive device: 2 hand rails  Walk up/down 4 steps activity   Walk up/down 4 steps assist level: Minimal Assistance - Patient > 75% Walk up/down 4 steps assistive device: 2 hand rails  Walk up/down 12 steps activity   Walk up/down 12 steps assist level: Minimal Assistance - Patient > 75% Walk up/down 12 steps assistive device: 2 hand rails  Pick up small objects from floor   Pick up small object from the floor assist level: Minimal Assistance - Patient > 75%    Wheelchair Is the patient using a wheelchair?: No   Wheelchair activity did not occur: N/A      Wheel 50 feet with 2 turns activity Wheelchair 50 feet with 2 turns activity did not occur: N/A    Wheel 150 feet activity Wheelchair 150 feet activity did not occur: N/A      Refer to Care Plan for [id number] PT Short Term Goal 1 (Week 1): STG = LTG due to ELOS  Recommendations for other services: None   Skilled Therapeutic Intervention Mobility Transfers Transfers: Sit to Stand;Stand to Sit;Stand Pivot Transfers Sit to Stand: Minimal Assistance - Patient > 75% Stand to Sit: Minimal Assistance - Patient > 75% Stand Pivot  Transfers: Minimal Assistance - Patient > 75% Stand Pivot Transfer Details: Tactile cues for initiation;Verbal cues for gait pattern;Verbal cues for sequencing;Verbal cues for safe use of DME/AE;Verbal cues for precautions/safety;Verbal cues for technique;Tactile cues for weight shifting Transfer (Assistive device): 1 person hand held assist Locomotion  Gait Ambulation: Yes Gait Assistance: Minimal Assistance - Patient > 75% Gait Distance (Feet):  165 Feet Assistive device: 1 person hand held assist Gait Assistance Details: Verbal cues for gait pattern;Verbal cues for sequencing;Verbal cues for precautions/safety;Tactile cues for initiation;Tactile cues for weight shifting;Verbal cues for technique;Visual cues/gestures for precautions/safety Gait Gait: Yes Gait Pattern: Impaired Gait Pattern: Step-through pattern;Narrow base of support;Lateral trunk lean to right;Decreased stride length Stairs / Additional Locomotion Stairs: Yes Stairs Assistance: Minimal Assistance - Patient > 75% Stair Management Technique: Two rails Number of Stairs: 12 Height of Stairs: 6 Wheelchair Mobility Wheelchair Mobility: No   Skilled Intervention: Pt sitting in w/c on arrival. Awake and appears agreeable to therapy evaluation. Unable to gather PLOF and social factors 2/2 expressive aphasia. Pt talking in "word salad" and question her receptive aphasia as well. Formal MMT and sensory testing deferred 2/2 aphasia. Transported patient in w/c to main rehab gym. Gait training 138f with HHA and no AD - gait mildly ataxic with some minor scissoring, R trunk lean. Delayed righting and stepping responses with LOB. Introduced RW and gait deficits improved, CGA overall for same distance. Stair training using 6inch steps and 2 hand rails, minA for stability and cues needed for awareness of foot placement during navigation. Car transfer completed with typical car sedan height, CGA overall for safety. Dynamic gait training by  bouncing ball while walking with no AD - minA for stability due to frequent LOB L<>R. Pt easily distracted by her environment and stopping to read hallway billboards. Returned to her room and she remained seated in w/c with all needs met, safety belt alarm on, call bell in reach.  Discharge Criteria: Patient will be discharged from PT if patient refuses treatment 3 consecutive times without medical reason, if treatment goals not met, if there is a change in medical status, if patient makes no progress towards goals or if patient is discharged from hospital.  The above assessment, treatment plan, treatment alternatives and goals were discussed and mutually agreed upon: by patient  CKeystonePT 04/17/2022, 11:33 AM

## 2022-04-17 NOTE — Progress Notes (Signed)
Inpatient Rehabilitation Care Coordinator Assessment and Plan Patient Details  Name: [name] Giles MRN: [id number] Date of Birth: 09/19/1955  Today's Date: 04/17/2022  Hospital Problems: Principal Problem:   ICH (intracerebral hemorrhage) ([place])  Past Medical History:  Past Medical History:  Diagnosis Date   Asthma    Medical history non-contributory    Past Surgical History:  Past Surgical History:  Procedure Laterality Date   COLONOSCOPY     COLONOSCOPY N/A 04/06/2013   Procedure: COLONOSCOPY;  Surgeon: Daneil Dolin, MD;  [facility];  Service: Endoscopy;  Laterality: N/A;  9:30 AM   ENDOMETRIAL ABLATION     IR ANGIO INTRA EXTRACRAN SEL INTERNAL CAROTID BILAT MOD SED  04/04/2022   IR US GUIDE VASC ACCESS RIGHT  04/04/2022   Social History:  reports that she has been smoking cigarettes. She has a 30.00 pack-year smoking history. She has never used smokeless tobacco. She reports current alcohol use. She reports that she does not use drugs.  Family / Support Systems Marital Status: Single Patient Roles: Parent, Other (Comment) (employee/sibling) Children: [name] Giles-son [phone]  [phone] Other Supports: [name] Giles-sister [phone] Anticipated Caregiver: Family members Ability/Limitations of Caregiver: son has a new job and family will pull together to assist with pt's care at DC Caregiver Availability: Other (Comment) (Coming up with a plan for DC aware will need supervision at least) Family Dynamics: Close with son and sister, she has friends and neighbors who are supportive and will check on her when she goes home  Social History Preferred language: English Religion: Patient Refused Cultural Background: No issues Education: [place] - How often do you need to have someone help you when you read instructions, pamphlets, or other written material from your doctor or pharmacy?: Never Writes: Yes Employment Status: Employed Name of Employer:  Designer, industrial/product orders Return to Work Plans: Unsure depends upon her progress here Public relations account executive Issues: No issues Guardian/Conservator: None-according to MD pt is not fully capable of making her own decisions will look toward her son to make any decisions while here   Abuse/Neglect Abuse/Neglect Assessment Can Be Completed: Yes Physical Abuse: Denies Verbal Abuse: Denies Sexual Abuse: Denies Exploitation of patient/patient's resources: Denies Self-Neglect: Denies  Patient response to: Social Isolation - How often do you feel lonely or isolated from those around you?: Rarely  Emotional Status Pt's affect, behavior and adjustment status: Pt is communicating but not understandable-word salad. Obtained information from son via telephone. He reports she has always been independent and worked hard she was still working full time when this happened. He does live with her but both work and do for themselves. Recent Psychosocial Issues: other health issues Psychiatric History: No history once appropriate would be appropriate for neuro-psych to see. High level physically speech is her main issue Substance Abuse History: Tobacco no other issues  Patient / Family Perceptions, Expectations & Goals Pt/Family understanding of illness & functional limitations: Son can explain his Mom's deficits and keeps in touch with her MD's. He likes to be kept updated regarding her medical issues and progress especially due to she can not communicate what medical staff reports Premorbid pt/family roles/activities: mom, employee, sister, neighbor, home owner, etc Anticipated changes in roles/activities/participation: resume Pt/family expectations/goals: Pt states: " I know I know."  Son states: " I hope she does well and can at least communicate her needs when she leaves there."  [employer]: None Premorbid Home Care/DME Agencies: None Transportation available  at discharge: self family  will proivide now Is the patient able to respond to transportation needs?: Yes In the past 12 months, has lack of transportation kept you from medical appointments or from getting medications?: No In the past 12 months, has lack of transportation kept you from meetings, work, or from getting things needed for daily living?: No Resource referrals recommended: Neuropsychology  Discharge Planning Living Arrangements: Children Support Systems: Children, Other relatives, Friends/neighbors, Social worker community Type of Residence: Private residence Insurance Resources: Multimedia programmer (specify) Sports administrator) Financial Resources: Employment, Family Support Financial Screen Referred: No Living Expenses: Own Money Management: Patient, Family Does the patient have any problems obtaining your medications?: No Home Management: self son to assist now and sister if needed Patient/Family Preliminary Plans: Return home with family rotating to provide assist, son has a new job so can not take off right now. Will await team's evaluations and work on safe plan for her. Care Coordinator Barriers to Discharge: Insurance for SNF coverage Care Coordinator Anticipated Follow Up Needs: HH/OP  Clinical Impression Pleasant female who is motivated to do well and was very active prior to admission still working full time. Her speech has largely been affected and she will need 24/7 supervision due to word salad and for safety when home. Son lives with her and is involved but is gone during the day at job. Pt's sister is involved also. Will await evaluations and work on a safe plan for her.  [name] Giles 04/17/2022, 1:59 PM

## 2022-04-17 NOTE — Progress Notes (Signed)
   04/17/22 1500  Clinical Encounter Type  Visited With Patient;Health care provider  Visit Type Initial  Referral From Other (Comment) (Spiritual Consult List)  Consult/Referral To Chaplain   Per Spiritual Consult list, Chaplain was unable to provide education on HCPOA due to cognitive deficit.  Nurse indicated that patient would be unable to understand.    Melody Haver, Resident Chaplain [phone]

## 2022-04-18 ENCOUNTER — Other Ambulatory Visit: Payer: Self-pay | Admitting: Physical Medicine and Rehabilitation

## 2022-04-18 ENCOUNTER — Inpatient Hospital Stay (HOSPITAL_COMMUNITY): Payer: 59

## 2022-04-18 DIAGNOSIS — I611 Nontraumatic intracerebral hemorrhage in hemisphere, cortical: Secondary | ICD-10-CM | POA: Diagnosis not present

## 2022-04-18 LAB — BASIC METABOLIC PANEL
Anion gap: 8 (ref 5–15)
BUN: 15 mg/dL (ref 8–23)
CO2: 28 mmol/L (ref 22–32)
Calcium: 8.8 mg/dL — ABNORMAL LOW (ref 8.9–10.3)
Chloride: 101 mmol/L (ref 98–111)
Creatinine, Ser: 0.65 mg/dL (ref 0.44–1.00)
GFR, Estimated: >60 mL/min (ref >60)
Glucose, Bld: 104 mg/dL — ABNORMAL HIGH (ref 70–99)
Potassium: 3.8 mmol/L (ref 3.5–5.1)
Sodium: 137 mmol/L (ref 135–145)

## 2022-04-18 LAB — VITAMIN B12: Vitamin B-12: 1766 pg/mL — ABNORMAL HIGH (ref 180–914)

## 2022-04-18 LAB — URINALYSIS, ROUTINE W REFLEX MICROSCOPIC
Bilirubin Urine: NEGATIVE
Glucose, UA: NEGATIVE mg/dL
Hgb urine dipstick: NEGATIVE
Ketones, ur: NEGATIVE mg/dL
Leukocytes,Ua: NEGATIVE
Nitrite: NEGATIVE
Protein, ur: NEGATIVE mg/dL
Specific Gravity, Urine: 1.014 (ref 1.005–1.030)
pH: 6 (ref 5.0–8.0)

## 2022-04-18 LAB — CBC WITH DIFFERENTIAL/PLATELET
Abs Immature Granulocytes: 0.05 10*3/uL (ref 0.00–0.07)
Basophils Absolute: 0 10*3/uL (ref 0.0–0.1)
Basophils Relative: 0 %
Eosinophils Absolute: 0.1 10*3/uL (ref 0.0–0.5)
Eosinophils Relative: 1 %
HCT: 33 % — ABNORMAL LOW (ref 36.0–46.0)
Hemoglobin: 11.3 g/dL — ABNORMAL LOW (ref 12.0–15.0)
Immature Granulocytes: 1 %
Lymphocytes Relative: 20 %
Lymphs Abs: 2.1 10*3/uL (ref 0.7–4.0)
MCH: 31 pg (ref 26.0–34.0)
MCHC: 34.2 g/dL (ref 30.0–36.0)
MCV: 90.7 fL (ref 80.0–100.0)
Monocytes Absolute: 0.7 10*3/uL (ref 0.1–1.0)
Monocytes Relative: 7 %
Neutro Abs: 7.3 10*3/uL (ref 1.7–7.7)
Neutrophils Relative %: 71 %
Platelets: 363 10*3/uL (ref 150–400)
RBC: 3.64 MIL/uL — ABNORMAL LOW (ref 3.87–5.11)
RDW: 13.4 % (ref 11.5–15.5)
WBC: 10.2 10*3/uL (ref 4.0–10.5)
nRBC: 0 % (ref 0.0–0.2)

## 2022-04-18 LAB — SARS CORONAVIRUS 2 BY RT PCR: SARS Coronavirus 2 by RT PCR: NEGATIVE

## 2022-04-18 LAB — FOLATE: Folate: 12.8 ng/mL (ref >5.9)

## 2022-04-18 LAB — IRON AND TIBC
Iron: 35 ug/dL (ref 28–170)
Saturation Ratios: 15 % (ref 10.4–31.8)
TIBC: 238 ug/dL — ABNORMAL LOW (ref 250–450)
UIBC: 203 ug/dL

## 2022-04-18 MED ORDER — VITAMIN D (ERGOCALCIFEROL) 1.25 MG (50000 UNIT) PO CAPS
50000.0000 [IU] | ORAL_CAPSULE | ORAL | Status: DC
  Administered 2022-04-25: 50000 [IU] via ORAL
  Filled 2022-04-18 (×2): qty 1

## 2022-04-18 MED ORDER — QUETIAPINE FUMARATE 25 MG PO TABS
12.5000 mg | ORAL_TABLET | Freq: Every evening | ORAL | Status: DC
  Administered 2022-04-18 – 2022-04-23 (×6): 12.5 mg via ORAL
  Filled 2022-04-18 (×6): qty 1

## 2022-04-18 MED ORDER — SODIUM CHLORIDE 0.9 % IV SOLN
1.5000 g | Freq: Four times a day (QID) | INTRAVENOUS | Status: DC
  Filled 2022-04-18 (×3): qty 4

## 2022-04-18 MED ORDER — SODIUM CHLORIDE 0.9 % IV SOLN
1.5000 g | Freq: Four times a day (QID) | INTRAVENOUS | Status: DC
  Administered 2022-04-18 – 2022-04-19 (×4): 1.5 g via INTRAVENOUS
  Filled 2022-04-18 (×6): qty 4

## 2022-04-18 NOTE — Progress Notes (Signed)
Physical Therapy Note  Patient Details  Name: [name] Giles MRN: [id number] Date of Birth: 08-17-1956 Today's Date: 04/18/2022  Pt off unit during scheduled session.  71mn missed time.     BJerrilyn Cairo8/30/2023, 3:28 PM

## 2022-04-18 NOTE — Progress Notes (Signed)
Patient ID: [name] Giles, female   DOB: 08/05/1956, 66 y.o.   MRN: [id number] Met with the patient to introduce self and review current situation, rehab process and plan of care however due to aphasia and cognitive deficits; patient unable to follow conversation. Notebook left in room for son. SW in during session; to follow up with son. Continue to follow along to address educational needs to facilitate preparation for discharge home. Margarito Liner

## 2022-04-18 NOTE — Progress Notes (Signed)
Physical Therapy Session Note  Patient Details  Name: [name] Giles MRN: [id number] Date of Birth: 14-Oct-1955  Today's Date: 04/18/2022 PT Individual Time: 1030-1046 PT Individual Time Calculation (min): 16 min   Today's Date: 04/18/2022 PT Missed Time: 44 Minutes Missed Time Reason: Patient unwilling to participate  Short Term Goals: Week 1:  PT Short Term Goal 1 (Week 1): STG = LTG due to ELOS  Skilled Therapeutic Interventions/Progress Updates:   Received pt slouched down in bed asleep with RN present trying to administer medications. Per RN, pt lethargic this morning and had fall yesterday (telesitter now in place). Pt continues to have severe expressive aphasia and "word salad" speech. Encouraged OOB, however pt refused clearly stating "no" in addition to other jargon. Removed covers from bed x 3 different times and pt proceeded to pull them over her face and roll away from therapist. Educated on benefits of OOB mobility, offered various options, and reminded pt of purpose of rehab, however pt continued to refuse. Flattened bed for improved positioning and pt scooted to [facility] without assist. Concluded session with pt supine in bed, needs within reach, and bed alarm on. Telesitter in place and fall mats placed on floor. 44 minutes missed of skilled physical therapy due to refusal.   Therapy Documentation Precautions:  Precautions Precautions: Fall Precaution Comments: expressive > receptive aphasia Restrictions Weight Bearing Restrictions: No  Therapy/Group: Individual Therapy Alfonse Alpers PT, DPT  04/18/2022, 7:12 AM

## 2022-04-18 NOTE — Progress Notes (Addendum)
Speech Language Pathology Daily Session Note  Patient Details  Name: [name] Giles MRN: [id number] Date of Birth: 05-12-1956  Today's Date: 04/18/2022 SLP Individual Time: 0900-0935 SLP Individual Time Calculation (min): 35 min  Short Term Goals: Week 1: SLP Short Term Goal 1 (Week 1): STGs=LTGs due to ELOS  Skilled Therapeutic Interventions: S: Pt seen this date for skilled ST intervention targeting communication/language goals outlined above. Pt received fatigued/lethargic and lying reclined in bed. Pt unable to verbalize preference for remaining in bed or transferring to w/c. Per NT, pt with decreased safety awareness and frequently attempting to get out of w/c; therefore, transferred back to bed. States pt had a fall last evening; MD reports no acute findings from CT head s/p fall. Agreeable to ST intervention at bedside.  O: SLP facilitated by providing max encouragement and skilled education re: purpose of CIR admission, reason for hospitalization, and current language deficits, though pt did not appear to comprehend education due to severity of aphasia and fatigue. Responded to biographical and basic + factual yes/no questions with <50% accuracy. Did not follow one-step commands despite Max A multimodal cues. Intermittently, pt would produce fluent verbalizations that were meaningful and automatic in nature. However, jargon still persists. Pt with overall decreased verbal output in the setting of fatigue, in comparison to ST evaluation on 04/17/2022.  A: Pt did not appear sitmulable for skilled ST intervention this date, with pt visibly fatigued; MD aware. Unable to assess/monitor swallow function and tolerance for medications crushed in puree, during med pass with LPN, due to refusal. Due to fatigue, pt missed 25 minutes of scheduled intervention time.  P: Pt left in bed with all safety measures activated. Call bell within reach and all immediate needs met. Continue per current ST POC  next session.   Pain No pain expressed by pt; NAD  Therapy/Group: Individual Therapy  Kandee Escalante A Laportia Carley 04/18/2022, 9:41 AM

## 2022-04-18 NOTE — Progress Notes (Signed)
Patient ID: [name] Giles, female   DOB: 08/14/1956, 65 y.o.   MRN: [id number]  Met with pt to give team conference update and contacted Matthew-son via telephone to share team conference information goals of supervision level and target discharge date of 9/6. He is aware of Mom's needing 24/7 supervision at discharge for safety due to communication issues. Have scheduled him for education Sat from 1:00-4:00 pm so can see her in therapies and see what she needs assistance with. Will continue to work on discharge needs. Dan-PA completed [employer] forms for her work. Will fax them in once completed. 

## 2022-04-18 NOTE — Progress Notes (Signed)
PROGRESS NOTE   Subjective/Complaints: More sleepy this AM Meds reviewed, seroquel decreased to 12.5 and sleep chart ordered CT head with improvements  ROS: Unable to obtain due to aphasia   Objective:   CT HEAD WO CONTRAST (5MM)  Result Date: 04/17/2022 CLINICAL DATA:  Mental status change, unknown cause EXAM: CT HEAD WITHOUT CONTRAST TECHNIQUE: Contiguous axial images were obtained from the base of the skull through the vertex without intravenous contrast. RADIATION DOSE REDUCTION: This exam was performed according to the departmental dose-optimization program which includes automated exposure control, adjustment of the mA and/or kV according to patient size and/or use of iterative reconstruction technique. COMPARISON:  CT 04/12/2022, CT angio head 03/30/2022, MRI head 03/30/2022. FINDINGS: Brain: No evidence of large-territorial acute infarction. Likely interval decrease in size of an at least 4.4 cm in length left temporal intraparenchymal hemorrhage with associated vasogenic edema. Persistent hyperdense component. Comparison of size of hemorrhagic components difficult given change in technique and scanning. No mass lesion. No extra-axial collection. Stable 8-9 mm left to right midline shift. No findings of uncal or transtentorial herniation. No hydrocephalus. Basilar cisterns are patent. Vascular: No hyperdense vessel. Skull: Likely burr hole of the right skull. Sinuses/Orbits: Paranasal sinuses and mastoid air cells are clear. The orbits are unremarkable. Other: Skin staples overlie the right frontal scalp. IMPRESSION: Interval decrease in size of an at least 4.4 cm in length left temporal intraparenchymal hemorrhage with associated vasogenic edema. Persistent hyperdense component that may represent acute blood products. Stable 8-9 mm left to right midline shift. Underlying mass lesion not excluded. Known underlying vascular malformation  better evaluated on CT angio head 03/30/2022. Electronically Signed   By: Iven Finn M.D.   On: 04/17/2022 16:04   Recent Labs    04/16/22 0350 04/17/22 0520  WBC 11.2* 10.8*  HGB 11.2* 11.4*  HCT 33.8* 34.2*  PLT 370 356   Recent Labs    04/16/22 0350 04/17/22 0520  NA  --  137  K  --  3.9  CL  --  105  CO2  --  25  GLUCOSE  --  98  BUN  --  12  CREATININE 0.59 0.63  CALCIUM  --  8.6*    Intake/Output Summary (Last 24 hours) at 04/18/2022 1020 Last data filed at 04/18/2022 0841 Gross per 24 hour  Intake 835 ml  Output --  Net 835 ml        Physical Exam: Vital Signs Blood pressure (!) 124/59, pulse 93, temperature 98.2 F (36.8 C), resp. rate 16, SpO2 92 %. Gen: no distress, normal appearing, more fatigued today HEENT: oral mucosa pink and moist, NCAT Cardio: Reg rate Chest: normal effort, normal rate of breathing Abd: soft, non-distended Ext: no edema Psych: pleasant, normal affect Skin:    General: Skin is warm and dry.  Neurological:     Mental Status: She is alert.     Comments: Patient is alert and comfortable.  Makes eye contact with examiner.  Follows simple commands with extra time, repetition and cues. Would not cooperate consistently with CN exam. Word salad with occasional appropriate response to simple questions (<10%). Moves all 4 limbs, senses pain. No  abnl tone.   Psychiatric:     Comments: Seems generally up beat, engaging.     Assessment/Plan: 1. Functional deficits which require 3+ hours per day of interdisciplinary therapy in a comprehensive inpatient rehab setting. Physiatrist is providing close team supervision and 24 hour management of active medical problems listed below. Physiatrist and rehab team continue to assess barriers to discharge/monitor patient progress toward functional and medical goals  Care Tool:  Bathing    Body parts bathed by patient: Right arm, Left arm, Abdomen, Chest, Front perineal area, Right upper leg,  Left upper leg, Right lower leg, Left lower leg, Face   Body parts bathed by helper: Buttocks     Bathing assist Assist Level: Minimal Assistance - Patient > 75%     Upper Body Dressing/Undressing Upper body dressing        Upper body assist Assist Level: Contact Guard/Touching assist    Lower Body Dressing/Undressing Lower body dressing      What is the patient wearing?: Underwear/pull up, Pants     Lower body assist Assist for lower body dressing: Minimal Assistance - Patient > 75%     Toileting Toileting    Toileting assist Assist for toileting: Minimal Assistance - Patient > 75%     Transfers Chair/bed transfer  Transfers assist  Chair/bed transfer activity did not occur: Safety/medical concerns  Chair/bed transfer assist level: Minimal Assistance - Patient > 75%     Locomotion Ambulation   Ambulation assist      Assist level: Minimal Assistance - Patient > 75% Assistive device: Hand held assist Max distance: 180f   Walk 10 feet activity   Assist     Assist level: Minimal Assistance - Patient > 75% Assistive device: Hand held assist   Walk 50 feet activity   Assist    Assist level: Minimal Assistance - Patient > 75% Assistive device: Hand held assist    Walk 150 feet activity   Assist    Assist level: Minimal Assistance - Patient > 75% Assistive device: Hand held assist    Walk 10 feet on uneven surface  activity   Assist     Assist level: Minimal Assistance - Patient > 75% Assistive device: Walker-rolling   Wheelchair     Assist Is the patient using a wheelchair?: No   Wheelchair activity did not occur: N/A         Wheelchair 50 feet with 2 turns activity    Assist    Wheelchair 50 feet with 2 turns activity did not occur: N/A       Wheelchair 150 feet activity     Assist  Wheelchair 150 feet activity did not occur: N/A       Blood pressure (!) 124/59, pulse 93, temperature 98.2 F (36.8  C), resp. rate 16, SpO2 92 %.  Medical Problem List and Plan: 1. 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   -WILL NEED FORMAL CATHETER ANGIOGRAM BEFORE DISCHARGE FROM CIR PER DR NFUXNATFTD reviewed notes, states to be performed when patient is medically stable and not confused.              -patient may shower             -ELOS/Goals: 10-12 days, supervision goals with PT, OT, mod assist with language 2.  Antithrombotics: -DVT/anticoagulation:  Pharmaceutical: Heparin             -antiplatelet therapy: N/A 3. Pain Management: NA, d/c  oxycodone 4. Insomnia: Melatonin 3 mg nightly.  Decrease Seroquel to 12.'5mg'$  HS             -monitor sleep habits 5. Neuropsych/cognition: This patient is not capable of making decisions on her own behalf. 6. Skin/Wound Care: Routine skin checks. Remove sutures/staples soon.  7. Fluids/Electrolytes/Nutrition: Routine in and outs follow-up chemistries 8.  Hyperlipidemia.  Crestor 9.  COPD with tobacco use.  Continue inhalers as directed.  Provide counts regards to cessation of nicotine products 10.  Hypotension: discontinue Norvasc 2.5 mg daily.  Monitor with increased mobility 11.  Urinary retention.  Urecholine 10 mg 3 times daily.  Check PVR' and emptying patterns.  12.  Hyperlipidemia.  Crestor 13.  Leukocytosis.   Maintained initially on empiric IV Rocephin and vancomycin.  Transitioned to Unasyn 04/16/2022 x 3 days and stop             -temps appear to be falling  14.  Dysphagia.  Dysphagia #1 thin liquids.                -advance per SLP 15. Suboptimal vitamin D: start ergocalciferol 50,000U once per week for 7 weeks 16. Hypoalbuminemia: advised to choose high protein foods 17. Fall: CT head ordered and reviewed; shows improvements in size of bleed    LOS: 2 days A FACE TO FACE EVALUATION WAS PERFORMED  Christi Wirick P Abishai Viegas 04/18/2022, 10:20 AM

## 2022-04-18 NOTE — Patient Care Conference (Signed)
Inpatient RehabilitationTeam Conference and Plan of Care Update Date: 04/18/2022   Time: 11:27 AM    Patient Name: [name] Giles      Medical Record Number: [id number]  Date of Birth: August 17, 1956 Sex: Female         Room/Bed: 4W18C/4W18C-01 Payor Info: Payor: Theme park manager / Plan: [place] / Product Type: *No Product type* /    Admit Date/Time:  04/16/2022  5:36 PM  Primary Diagnosis:  ICH (intracerebral hemorrhage) [facility])  Hospital Problems: Principal Problem:   ICH (intracerebral hemorrhage) [facility])    Expected Discharge Date: Expected Discharge Date: 04/25/22  Team Members Present: Physician leading conference: Dr. Leeroy Cha Social Worker Present: Ovidio Kin, LCSW Nurse Present: Dorien Chihuahua, RN PT Present: Ginnie Smart, PT OT Present: Willeen Cass, OT SLP Present: Helaine Chess, SLP PPS Coordinator present : Gunnar Fusi, SLP     Current Status/Progress Goal Weekly Team Focus  Bowel/Bladder   Incontinent x 2, last BM 04/16/22  Regain continence.  Assist with timed toileting.  Assess q shift.   Swallow/Nutrition/ Hydration   Dys. 1 textures with thin liquids, Mod-Max visual and tactile cues for use of swallowing strategies  Min A  tolerance of current diet, use of swallowing strategies, trials of upgraded textures   ADL's   overall min A; limited by cogntion and safety awareness and language deficits  supervision  fam education, cognitive retraining, functional mobility, ADL retraining   Mobility   minA bed mobility, minA sit<>stand transfers, minA gait 188f with HHA, minA 12 stairs using 2 hand rails. Expressive > receptive aphasia, word salad speech  Supervision  Functional transfers, gait training with LRAD, dynamic standing balance, DC planning, family education.   Communication   Max-Total A due to severe Wenicke's aphasia impacting all four modalities of language  Max A for verbal expression of wants/needs, Mod A for use of  multimodal communication  establishing functional comunication   Safety/Cognition/ Behavioral Observations  Max A for attention due to fluent stream of constant verbalizations due to aphasia  Mod A  sustained attention to functional tasks   Pain   No evidence of pain.  Keep patient pain free.  Monitor and assess pain q4 and prn.   Skin   Skin intact.  Prevent any new skin breakdown.  Monitor and assess q shift and prn.     Discharge Planning:  Son lives with pt but works trying to arrange other family members to be there while he works. Pt will need 24/7 supervision for safety at home   Team Discussion: Patient fatigued this am with wernicke's aphasia, poor focus/attention deficits; requiring tactile cues. MD following sleep chart, checking UA. Also note soft BPwtih TEDs.   Patient on target to meet rehab goals: Currently able to ambulate 200' with HHA. Completes transfers with min assist and bathing/dressing with min assist. Physically doing well, however [employer] a major effect on language. Goals for discharge set for supervision overall.  *See Care Plan and progress notes for long and short-term goals.   Revisions to Treatment Plan:  N/a  Teaching Needs: Safety, medications, dietary modifications, etc  Current Barriers to Discharge: Decreased caregiver support and Home enviroment access/layout  Possible Resolutions to Barriers: Family education HH follow up services 24/7 supervision recommended for safety     Medical Summary Current Status: verbosity, sutures on scalp, fall yesterday, Wernicke's aphasia, ICH, lethargy  Barriers to Discharge: Behavior;Medical stability  Barriers to Discharge Comments: verbosity, sutures on scalp, fall yesterday, Wernicke's  aphasia, ICH, lethargy Possible Resolutions to [employer]: will discuss with NSGY that minimal cognitive gains are expcted this admission to determine when she will need normal catheter angiogram, UA yesterday  normal, check CXR today   Continued Need for Acute Rehabilitation Level of Care: The patient requires daily medical management by a physician with specialized training in physical medicine and rehabilitation for the following reasons: Direction of a multidisciplinary physical rehabilitation program to maximize functional independence : Yes Medical management of patient stability for increased activity during participation in an intensive rehabilitation regime.: Yes Analysis of laboratory values and/or radiology reports with any subsequent need for medication adjustment and/or medical intervention. : Yes   I attest that I was present, lead the team conference, and concur with the assessment and plan of the team.   Dorien Chihuahua B 04/18/2022, 2:36 PM

## 2022-04-18 NOTE — Progress Notes (Addendum)
Physical Therapy Session Note  Patient Details  Name: [name] Giles MRN: [id number] Date of Birth: 17-May-1956  Today's Date: 04/18/2022 PT Individual Time: 0800-0830 PT Individual Time Calculation (min): 30 min   Short Term Goals: Week 1:  PT Short Term Goal 1 (Week 1): STG = LTG due to ELOS  Skilled Therapeutic Interventions/Progress Updates:      Pt supine in bed to start - playing with items on her tray in front of her which included spilled milk, spilled coffee, and an open container of hand sanitizer. Pt lacking awareness. Pt continuing to have severe expressive aphasia, "word salad" speech.  Donned knee-high compression socks with dependant assist at bed level. Supine<>Sitting EOB with HOB flat, minA for trunk support. Able to sit EOB with supervision.   She required minA for LB and UB dressing due to motor apraxia. Unable to don shoes without assist due to apraxia as well.   Pt assisted to w/c via stand<>pivot transfer with minA - motor apraxia impacting sequencing and safety during transfer. She appeared fatigued and pale once in w/c.   BP taken: 69/53 BP retaken: 111/47  She completed oral care at the sink while seated in w/c. Able to place toothpaste on brush and brush teeth without assist but somewhat perseverative and needing gentle cueing to wash and complete task.   BP retaken after oral care: 101/40  LPN made aware of patient's fatigue and low BP. Pt remained seated in w/c with safety belt alarm on, telesitter in room, and call bell in lap.   Therapy Documentation Precautions:  Precautions Precautions: Fall Precaution Comments: expressive > receptive aphasia Restrictions Weight Bearing Restrictions: No General:    Therapy/Group: Individual Therapy  Anessia Oakland P Doreen Garretson PT 04/18/2022, 7:49 AM

## 2022-04-18 NOTE — Progress Notes (Signed)
Occupational Therapy Session Note  Patient Details  Name: [name] Giles MRN: [id number] Date of Birth: 1956-05-29  Today's Date: 04/18/2022 OT Individual Time: 1st Session 1300-1345, 2nd Session [phone]  OT Individual Time Calculation (min): 45 min, 35 min; Missed minutes 10 due to fever    Short Term Goals: Week 1:  OT Short Term Goal 1 (Week 1): STG = LTG d/t ELOS  Skilled Therapeutic Interventions/Progress Updates:  1st Session: Pt seen for make up of missed minutes and resting in bed awake but with occasional drowsiness. OT assisted pt to EOB with CGA to perform face washing, hair combing, simple beverage opening and sipping during RO, zip up sweatshirt donning and slipper sock donning. Pt required significant cues due to increased fatigue as session progressed. Pt required min A for all tasks with mod-max cues. Pt able to cross LE's vs bending for sock donning. Pt using bilateral UE's to what appeared equally. Pt with significant difficulty with expressive language but did read simple words on cards retrieved from shelf. Pt returned to bed due to planned scan as per nursing. Bed exit alarm, call button and needs within reach.  2nd Session: OT arrived for session and pt's sister was visiting. OT attempted to sit pt at EOB but pt with resistance toward activity but then agreeable. Pt's sister reported that the pt had been running a low grade fever and nurse had brought meds and pt expressed them into soda. OT was able to arouse pt, move from supine to EOB with min A and work to ensure soda drank to ensure meds given. Pt was provided with commode for toileting trial with mod A and pt did not void. Required significantly more cues and assist than earlier session most likely due to fever. Pt returned to supine with sister visiting. Bed exit alarm, call button and needs within reach.  Therapy Documentation Precautions:  Precautions Precautions: Fall Precaution Comments: expressive >  receptive aphasia Restrictions Weight Bearing Restrictions: No    Therapy/Group: Individual Therapy  Barnabas Lister 04/18/2022, 7:55 AM

## 2022-04-19 ENCOUNTER — Inpatient Hospital Stay (HOSPITAL_COMMUNITY): Payer: 59

## 2022-04-19 DIAGNOSIS — R509 Fever, unspecified: Secondary | ICD-10-CM

## 2022-04-19 DIAGNOSIS — J449 Chronic obstructive pulmonary disease, unspecified: Secondary | ICD-10-CM

## 2022-04-19 DIAGNOSIS — G935 Compression of brain: Secondary | ICD-10-CM

## 2022-04-19 DIAGNOSIS — I611 Nontraumatic intracerebral hemorrhage in hemisphere, cortical: Secondary | ICD-10-CM | POA: Diagnosis not present

## 2022-04-19 DIAGNOSIS — F17200 Nicotine dependence, unspecified, uncomplicated: Secondary | ICD-10-CM

## 2022-04-19 LAB — URINE CULTURE: Culture: NO GROWTH

## 2022-04-19 LAB — BLOOD CULTURE ID PANEL (REFLEXED) - BCID2

## 2022-04-19 LAB — CBC WITH DIFFERENTIAL/PLATELET
Abs Immature Granulocytes: 0.05 10*3/uL (ref 0.00–0.07)
Basophils Absolute: 0 10*3/uL (ref 0.0–0.1)
Basophils Relative: 0 %
Eosinophils Absolute: 0.1 10*3/uL (ref 0.0–0.5)
Eosinophils Relative: 1 %
HCT: 32.6 % — ABNORMAL LOW (ref 36.0–46.0)
Hemoglobin: 10.9 g/dL — ABNORMAL LOW (ref 12.0–15.0)
Immature Granulocytes: 1 %
Lymphocytes Relative: 25 %
Lymphs Abs: 2.6 10*3/uL (ref 0.7–4.0)
MCH: 30.4 pg (ref 26.0–34.0)
MCHC: 33.4 g/dL (ref 30.0–36.0)
MCV: 91.1 fL (ref 80.0–100.0)
Monocytes Absolute: 0.9 10*3/uL (ref 0.1–1.0)
Monocytes Relative: 8 %
Neutro Abs: 6.7 10*3/uL (ref 1.7–7.7)
Neutrophils Relative %: 65 %
Platelets: 340 10*3/uL (ref 150–400)
RBC: 3.58 MIL/uL — ABNORMAL LOW (ref 3.87–5.11)
RDW: 13.3 % (ref 11.5–15.5)
WBC: 10.3 10*3/uL (ref 4.0–10.5)
nRBC: 0 % (ref 0.0–0.2)

## 2022-04-19 LAB — C-REACTIVE PROTEIN: CRP: 0.7 mg/dL (ref <1.0)

## 2022-04-19 LAB — HEPATITIS B SURFACE ANTIGEN: Hepatitis B Surface Ag: NONREACTIVE

## 2022-04-19 LAB — CK: Total CK: 166 U/L (ref 38–234)

## 2022-04-19 LAB — FERRITIN: Ferritin: 389 ng/mL — ABNORMAL HIGH (ref 11–307)

## 2022-04-19 LAB — SEDIMENTATION RATE: Sed Rate: 58 mm/hr — ABNORMAL HIGH (ref 0–22)

## 2022-04-19 MED ORDER — IOHEXOL 300 MG/ML  SOLN
100.0000 mL | Freq: Once | INTRAMUSCULAR | Status: AC | PRN
  Administered 2022-04-19: 100 mL via INTRAVENOUS

## 2022-04-19 MED ORDER — SODIUM CHLORIDE 0.9 % IV SOLN
2.0000 g | Freq: Two times a day (BID) | INTRAVENOUS | Status: DC
  Administered 2022-04-19 – 2022-04-23 (×8): 2 g via INTRAVENOUS
  Filled 2022-04-19 (×11): qty 20

## 2022-04-19 MED ORDER — IOHEXOL 9 MG/ML PO SOLN
500.0000 mL | ORAL | Status: AC
  Administered 2022-04-19 (×2): 500 mL via ORAL

## 2022-04-19 NOTE — Consult Note (Signed)
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 and midline shift status post ventriculostomy via bur hole on March 29, 2022 with subsequent removal on the 18th Ongoing fevers throughout her entire hospitalization in the presence and in the absence of antibiotics History of COPD and tobacco abuse  Plan:  We will order CT chest abdomen pelvis We will run allergy serologic work-up If CT is unrevealing would obtain MRI of the brain with contrast to ensure no evidence of infection on imaging One could consider LP to look for possible meningitis related to her surgical procedure--though I am more strongly suspect that this is a central fever occurring in the context of blood in the brain and I would want Neurosurgery to be OK with pursuing LP DC antibiotics  Principal Problem:   ICH (intracerebral hemorrhage) (HCC)   Scheduled Meds:  arformoterol  15 mcg Nebulization BID   bethanechol  10 mg Oral TID   budesonide (PULMICORT) nebulizer solution  0.25 mg Nebulization BID   feeding supplement  237 mL Oral BID BM   heparin injection (subcutaneous)  5,000 Units Subcutaneous Q8H   iohexol  500 mL Oral Q1H   melatonin  3 mg Oral QHS   multivitamin with minerals  1 tablet Oral Daily   QUEtiapine  12.5 mg Oral QPM   revefenacin  175 mcg Nebulization Daily   rosuvastatin  20 mg Oral Daily   senna-docusate  1 tablet Oral BID   Vitamin D (Ergocalciferol)  50,000 Units Oral Q7 days   Continuous Infusions: PRN Meds:.acetaminophen **OR** acetaminophen (TYLENOL) oral liquid 160 mg/5 mL **OR** acetaminophen, ipratropium-albuterol  HPI: [name] Giles is a 66 y.o. female with history of COPD and tobacco abuse who was found down on 10 August.  CT had shown  intraparenchymal hemorrhage centered within the left anterior temporal and frontal lobes.  There is also 5 mm of right midline shift and hydrocephalus.  Underwent right frontal ventriculostomy via bur hole on 10 August which was then removed on 18 August by neurosurgery.  Her repeat CT scans of shown stable findings with decreased mass effect and decreased edema.  His recent CT on 29 August showed decrease in the 4.4 cm length left temporal intraparenchymal hemorrhage with vasogenic edema and persistent hyperdense component likely can consistent with acute blood products and stable 8 to 9 mm left midline shift.  I have not found any evidence of an infectious etiology on imaging or blood work including blood cultures during her admission.  I suspect she is having a central fever in the context of intraparenchymal bleed.  That being said we can certainly look for other sources of fever and I have ordered a CT chest abdomen pelvis as well as an extensive serological work-up for fever of unknown origin.  He also consider repeat imaging of the brain including MRI of the brain with contrast to look for evidence of antral cerebral hemorrhage.  It is conceivable that she could have meningitis associated with placement of her ventriculostomy though other than the fevers does not have much in the way of localizing symptoms though her inability to communicate much does compromise our ability to get the best history from her.    I spent 122  minutes with the  patient including than 50% of the time in face to face counseling of the patient regarding FUO work-up personally reviewing MRI of the brain and CTs of the brain chest x-rays along with review of medical records in preparation for the visit and during the visit and in coordination of her care.   She has had ongoing fevers throughout her hospital stay that have occurred in the presence and in the absence of antibiotics.     Review of  Systems: Review of Systems  Unable to perform ROS: Mental acuity    Past Medical History:  Diagnosis Date   Asthma    Medical history non-contributory     Social History   Tobacco Use   Smoking status: Every Day    Packs/day: 1.00    Years: 30.00    Total pack years: 30.00    Types: Cigarettes   Smokeless tobacco: Never  Substance Use Topics   Alcohol use: Yes    Comment: wine occasionally   Drug use: No    Family History  Adopted: Yes  Problem Relation Age of Onset   Breast cancer Mother    Hypertension Brother    Diabetes Brother    Hypertension Brother    Colon cancer Neg Hx    No Known Allergies  OBJECTIVE: Blood pressure (!) 139/58, pulse 83, temperature 97.7 F (36.5 C), resp. rate 18, SpO2 97 %.  Physical Exam Constitutional:      General: She is not in acute distress.    Appearance: Normal appearance. She is well-developed. She is not ill-appearing or diaphoretic.  HENT:     Head: Normocephalic and atraumatic.     Comments: Ventriculostomy site is clean    Right Ear: Hearing and external ear normal.     Left Ear: Hearing and external ear normal.     Nose: No nasal deformity or rhinorrhea.  Eyes:     General: No scleral icterus.    Conjunctiva/sclera: Conjunctivae normal.     Right eye: Right conjunctiva is not injected.     Left eye: Left conjunctiva is not injected.     Pupils: Pupils are equal, round, and reactive to light.  Neck:     Vascular: No JVD.  Cardiovascular:     Rate and Rhythm: Normal rate and regular rhythm.     Heart sounds: Normal heart sounds, S1 normal and S2 normal. No murmur heard.    No friction rub. No gallop.  Pulmonary:     Effort: No respiratory distress.     Breath sounds: No rales.  Abdominal:     General: Bowel sounds are normal. There is no distension.     Palpations: Abdomen is soft.     Tenderness: There is no abdominal tenderness.  Musculoskeletal:        General: Normal range of motion.     Right  shoulder: Normal.     Left shoulder: Normal.     Cervical back: Normal range of motion and neck supple.     Right hip: Normal.     Left hip: Normal.     Right knee: Normal.     Left knee: Normal.  Lymphadenopathy:     Head:     Right side of head: No submandibular, preauricular or posterior auricular adenopathy.     Left side of head: No submandibular, preauricular or posterior auricular adenopathy.     Cervical: No cervical adenopathy.     Right cervical: No superficial or deep cervical adenopathy.  Left cervical: No superficial or deep cervical adenopathy.  Skin:    General: Skin is warm and dry.     Coloration: Skin is not pale.     Findings: No abrasion, bruising, ecchymosis, erythema, lesion or rash.     Nails: There is no clubbing.  Neurological:     Mental Status: She is alert. She is disoriented.     Comments: Right UE weakness vs left  Psychiatric:        Attention and Perception: She is inattentive.        Behavior: Behavior is cooperative.        Cognition and Memory: Cognition is impaired. Memory is impaired. She exhibits impaired recent memory and impaired remote memory.     Lab Results Lab Results  Component Value Date   WBC 10.3 04/19/2022   HGB 10.9 (L) 04/19/2022   HCT 32.6 (L) 04/19/2022   MCV 91.1 04/19/2022   PLT 340 04/19/2022    Lab Results  Component Value Date   CREATININE 0.65 04/18/2022   BUN 15 04/18/2022   NA 137 04/18/2022   K 3.8 04/18/2022   CL 101 04/18/2022   CO2 28 04/18/2022    Lab Results  Component Value Date   ALT 53 (H) 04/17/2022   AST 31 04/17/2022   ALKPHOS 57 04/17/2022   BILITOT 0.4 04/17/2022     Microbiology: Recent Results (from the past 240 hour(s))  Culture, blood (Routine X 2) w Reflex to ID Panel     Status: None   Collection Time: 04/12/22  8:49 AM   Specimen: BLOOD  Result Value Ref Range Status   Specimen Description BLOOD SITE NOT SPECIFIED  Final   Special Requests   Final    BOTTLES DRAWN AEROBIC  AND ANAEROBIC Blood Culture adequate volume   Culture   Final    NO GROWTH 5 DAYS Performed at [facility], [address], [place], [place] [address]    Report Status 04/17/2022 FINAL  Final  Culture, blood (Routine X 2) w Reflex to ID Panel     Status: None   Collection Time: 04/12/22  8:49 AM   Specimen: BLOOD  Result Value Ref Range Status   Specimen Description BLOOD SITE NOT SPECIFIED  Final   Special Requests   Final    BOTTLES DRAWN AEROBIC AND ANAEROBIC Blood Culture adequate volume   Culture   Final    NO GROWTH 5 DAYS Performed at [facility], [place] [address], [place], [place] [address]    Report Status 04/17/2022 FINAL  Final  Urine Culture     Status: None   Collection Time: 04/18/22  3:25 AM   Specimen: Urine, Clean Catch  Result Value Ref Range Status   Specimen Description URINE, CLEAN CATCH  Final   Special Requests NONE  Final   Culture   Final    NO GROWTH Performed at [facility], [place] [address] [place], [place] [address]    Report Status 04/19/2022 FINAL  Final  Culture, blood (Routine X 2) w Reflex to ID Panel     Status: None (Preliminary result)   Collection Time: 04/18/22  2:56 PM   Specimen: BLOOD  Result Value Ref Range Status   Specimen Description BLOOD LEFT ANTECUBITAL  Final   Special Requests   Final    BOTTLES DRAWN AEROBIC AND ANAEROBIC Blood Culture adequate volume   Culture   Final    NO GROWTH < 24  HOURS Performed at [facility], [place] [address], [place], [place] [address]    Report Status PENDING  Incomplete  Culture, blood (Routine X 2) w Reflex to ID Panel     Status: None (Preliminary result)   Collection Time: 04/18/22  2:57 PM   Specimen: BLOOD  Result Value Ref Range Status   Specimen Description BLOOD LEFT ANTECUBITAL  Final   Special Requests   Final    BOTTLES DRAWN AEROBIC AND ANAEROBIC Blood Culture adequate volume   Culture   Final    NO GROWTH < 24 HOURS Performed at [place], [place] [address], [place], [place] [address]    Report Status PENDING  Incomplete  SARS Coronavirus 2 by RT PCR (hospital order, performed in [facility] hospital lab) *cepheid single result test* Anterior Nasal Swab     Status: None   Collection Time: 04/18/22  3:03 PM   Specimen: Anterior Nasal Swab  Result Value Ref Range Status   SARS Coronavirus 2 by RT PCR NEGATIVE NEGATIVE Final    Comment: (NOTE) SARS-CoV-2 target nucleic acids are NOT DETECTED.  The SARS-CoV-2 RNA is generally detectable in upper and lower respiratory specimens during the acute phase of infection. The lowest concentration of SARS-CoV-2 viral copies this assay can detect is 250 copies / mL. A negative result does not preclude SARS-CoV-2 infection and should not be used as the sole basis for treatment or other patient management decisions.  A negative result may occur with improper specimen collection / handling, submission of specimen other than nasopharyngeal swab, presence of viral mutation(s) within the areas targeted by this assay, and inadequate number of viral copies (<250 copies / mL). A negative result must be combined with clinical observations, patient history, and epidemiological information.  Fact Sheet for Patients:   https://www.patel.info/  Fact Sheet for Healthcare Providers: https://hall.com/  This test is not yet approved or  cleared by the [place] FDA and has been authorized for detection and/or diagnosis of SARS-CoV-2 by FDA under an Emergency Use Authorization (EUA).  This EUA will remain in effect (meaning this test can be used) for the duration of the COVID-19 declaration under Section 564(b)(1) of the Act, 21 U.S.C. section 360bbb-3(b)(1), unless the authorization is terminated or revoked sooner.  Performed at [facility], [place] [address] [place], [place] [address]     Alcide Evener, [place] [facility]  [facility] [phone] pager  04/19/2022, 2:46 PM

## 2022-04-19 NOTE — Progress Notes (Signed)
Occupational Therapy Session Note  Patient Details  Name: [name] Giles MRN: [id number] Date of Birth: 1955-12-29  Today's Date: 04/19/2022 OT Individual Time: 1445-1535 OT Individual Time Calculation (min): 50 min    Short Term Goals: Week 1:  OT Short Term Goal 1 (Week 1): STG = LTG d/t ELOS  Skilled Therapeutic Interventions/Progress Updates:  Pt on COVID precautions- with negative first test, awaiting 2nd test to confirm therefore OT treatment in room only per protocol. Sister and nurse in room upon OT arrival. Pt going down for testing shortly. OT training for pt to access [facility] for bowel movement. Was able to use RW with min A and cues with mod cues for LB clothing management and overall sequencing. OT provided all peri hygiene due to large loose stool and Flowsheets updated.  Improved expressive language with short coherent phrases versus last OT session as much more alert and attentive. Pt then amb with RW to sink-side for hand washing with min A and mod cues. Returned to bed level due to testing upcoming. Bed exit engaged, call button in reach and all needs on bedside tray.   Therapy Documentation Precautions:  Precautions Precautions: Fall Precaution Comments: expressive > receptive aphasia Restrictions Weight Bearing Restrictions: No   Therapy/Group: Individual Therapy  Barnabas Lister 04/19/2022, 3:36 PM

## 2022-04-19 NOTE — IPOC Note (Signed)
Overall Plan of Care [facility]) Patient Details Name: [name] Giles MRN: [id number] DOB: October 17, 1955  Admitting Diagnosis: ICH (intracerebral hemorrhage) [facility])  Hospital Problems: Principal Problem:   ICH (intracerebral hemorrhage) ([place])     Functional Problem List: Nursing Bladder, Bowel, Safety, Medication Management, Endurance  PT Balance, Endurance, Motor, Safety  OT Balance, Vision, Perception, Cognition, Safety, Sensory, Endurance, Motor  SLP Cognition, Linguistic, Nutrition  TR         Basic ADL's: OT Eating, Grooming, Bathing, Dressing, Toileting     Advanced  ADL's: OT Simple Meal Preparation     Transfers: PT Bed Mobility, Bed to Chair, Car  OT Toilet, Tub/Shower     Locomotion: PT Ambulation, Stairs     Additional Impairments: OT Fuctional Use of Upper Extremity  SLP Swallowing, Communication, Social Cognition comprehension, expression Social Interaction, Problem Solving, Memory, Attention, Awareness  TR      Anticipated Outcomes Item Anticipated Outcome  Self Feeding set-up assist  Swallowing  Mod A   Basic self-care  set-up for seated tasks & supervision for standing tasks  Toileting  supervision   Bathroom Transfers supervision  Bowel/Bladder  manage bowel and bladder w mod I assist  Transfers  supervision  Locomotion  supervision  Communication  max A for multimodal communication  Cognition  Mod A  Pain  n/a  Safety/Judgment  manage w cues   Therapy Plan: PT Intensity: Minimum of 1-2 x/day ,45 to 90 minutes PT Frequency: 5 out of 7 days PT Duration Estimated Length of Stay: 7-9 days OT Intensity: Minimum of 1-2 x/day, 45 to 90 minutes OT Frequency: 5 out of 7 days OT Duration/Estimated Length of Stay: 7 - 9 days SLP Intensity: Minumum of 1-2 x/day, 30 to 90 minutes SLP Frequency: 3 to 5 out of 7 days SLP Duration/Estimated Length of Stay: 7-9 days   Team Interventions: Nursing Interventions Bladder Management, Disease  Management/Prevention, Medication Management, Discharge Planning, Bowel Management, Patient/Family Education  PT interventions Ambulation/gait training, Discharge planning, Functional mobility training, Psychosocial support, Therapeutic Activities, Visual/perceptual remediation/compensation, Therapeutic Exercise, Skin care/wound management, Disease management/prevention, Neuromuscular re-education, Balance/vestibular training, Cognitive remediation/compensation, DME/adaptive equipment instruction, Pain management, Splinting/orthotics, UE/LE Coordination activities, Stair training, UE/LE Strength taining/ROM, Patient/family education, Functional electrical stimulation, Community reintegration  OT Interventions    SLP Interventions Cognitive remediation/compensation, Dysphagia/aspiration precaution training, Internal/external aids, Speech/Language facilitation, Therapeutic Activities, Environmental controls, Cueing hierarchy, Functional tasks, Patient/family education  TR Interventions    SW/CM Interventions Discharge Planning, Psychosocial Support, Patient/Family Education   Barriers to Discharge MD  Medical stability  Nursing Decreased caregiver support, Home environment access/layout 1 level 4 ste bil rails w son  PT Decreased caregiver support, Lack of/limited family support, Insurance underwriter for SNF coverage    OT      SLP Nutrition means    SW Insurance for SNF coverage     Team Discharge Planning: Destination: PT-Home ,OT- Home , SLP-Home Projected Follow-up: PT-24 hour supervision/assistance, Outpatient PT, OT-  Home health OT, SLP-Home Health SLP, 24 hour supervision/assistance Projected Equipment Needs: PT-To be determined, OT- To be determined, Tub/shower bench, SLP-None recommended by SLP Equipment Details: PT- , OT-Pending availability to talk to family to ensure home set-up and equipment Patient/family involved in discharge planning: PT- Patient,  OT-Patient, SLP-Patient, Family  member/caregiver  MD ELOS: 9 days Medical Rehab Prognosis:  Excellent Assessment: The patient has been admitted for CIR therapies with the diagnosis of ICH. The team will be addressing functional mobility, strength, stamina, balance, safety, adaptive techniques and equipment, self-care,  bowel and bladder mgt, patient and caregiver education. Goals have been set at supervision to [place]. Anticipated discharge destination is home.        See Team Conference Notes for weekly updates to the plan of care

## 2022-04-19 NOTE — Progress Notes (Signed)
PHARMACY - PHYSICIAN COMMUNICATION CRITICAL VALUE ALERT - BLOOD CULTURE IDENTIFICATION (BCID)  [name] Giles is an 66 y.o. female who transferred to [facility] inpatient rehab post intracranial hemorrhage on 04/16/2022.   Assessment:  Patient has had intermittent fevers on and off antibiotics. ID was consulted today. Broad differential for infection including meningitis. Antibiotics were stopped today d/t no obvious signs of infection. Patient now with 1/8 bottles growing GNR, nothing reported on BCID.   Name of physician (or Provider) Contacted: Reesa Chew  Current antibiotics: none  Changes to prescribed antibiotics recommended:  Recommendations accepted by provider Will cover empirically with high dose ceftriaxone overnight and follow up ID recs in am.   Results for orders placed or performed during the hospital encounter of 04/16/22  Blood Culture ID Panel (Reflexed) (Collected: 04/18/2022  2:57 PM)  Result Value Ref Range   Enterococcus faecalis NOT DETECTED NOT DETECTED   Enterococcus Faecium NOT DETECTED NOT DETECTED   Listeria monocytogenes NOT DETECTED NOT DETECTED   Staphylococcus species NOT DETECTED NOT DETECTED   Staphylococcus aureus (BCID) NOT DETECTED NOT DETECTED   Staphylococcus epidermidis NOT DETECTED NOT DETECTED   Staphylococcus lugdunensis NOT DETECTED NOT DETECTED   Streptococcus species NOT DETECTED NOT DETECTED   Streptococcus agalactiae NOT DETECTED NOT DETECTED   Streptococcus pneumoniae NOT DETECTED NOT DETECTED   Streptococcus pyogenes NOT DETECTED NOT DETECTED   A.calcoaceticus-baumannii NOT DETECTED NOT DETECTED   Bacteroides fragilis NOT DETECTED NOT DETECTED   Enterobacterales NOT DETECTED NOT DETECTED   Enterobacter cloacae complex NOT DETECTED NOT DETECTED   Escherichia coli NOT DETECTED NOT DETECTED   Klebsiella aerogenes NOT DETECTED NOT DETECTED   Klebsiella oxytoca NOT DETECTED NOT DETECTED   Klebsiella pneumoniae NOT DETECTED NOT  DETECTED   Proteus species NOT DETECTED NOT DETECTED   Salmonella species NOT DETECTED NOT DETECTED   Serratia marcescens NOT DETECTED NOT DETECTED   Haemophilus influenzae NOT DETECTED NOT DETECTED   Neisseria meningitidis NOT DETECTED NOT DETECTED   Pseudomonas aeruginosa NOT DETECTED NOT DETECTED   Stenotrophomonas maltophilia NOT DETECTED NOT DETECTED   Candida albicans NOT DETECTED NOT DETECTED   Candida auris NOT DETECTED NOT DETECTED   Candida glabrata NOT DETECTED NOT DETECTED   Candida krusei NOT DETECTED NOT DETECTED   Candida parapsilosis NOT DETECTED NOT DETECTED   Candida tropicalis NOT DETECTED NOT DETECTED   Cryptococcus neoformans/gattii NOT DETECTED NOT DETECTED   Erin Hearing PharmD., BCPS Clinical Pharmacist 04/19/2022 6:43 PM

## 2022-04-19 NOTE — Progress Notes (Deleted)
Patient ID: [name] Giles, female   DOB: March 15, 1956, 66 y.o.   MRN: [id number]  Spoke with [facility] CM and received [employer] appeal, needs a discharge order before you can appeal the discharge. Have reached out to MD/PA regarding this, then will let son know he will need to call back to appeal again.

## 2022-04-19 NOTE — Progress Notes (Signed)
Speech Language Pathology Daily Session Note  Patient Details  Name: [name] Giles MRN: [id number] Date of Birth: May 27, 1956  Today's Date: 04/19/2022 SLP Individual Time: 1000-1045 SLP Individual Time Calculation (min): 45 min  Short Term Goals: Week 1: SLP Short Term Goal 1 (Week 1): STGs=LTGs due to ELOS  Skilled Therapeutic Interventions: S: Pt seen this date for skilled ST intervention targeting communication and deglutition goals outlined in care plan. Pt received visibly fatigued and lying in bed. Agreeable to ST intervention at bedside.  O: SLP facilitated today's session by providing Total A for selecting written choices (field of 3) to indicate general location, situation, and name. Answered yes/no questions pertaining to self and immediate environment with ~50% accuracy given Mod A. Unable to demonstrate command following within a therapeutic context. Continues to exhibit neologisms and "word salad" speech. Will occasionally verbalize automatic phrases that are appropriate in context. Unable to imitate single words upon request. Pt with overall poor intellectual awareness of deficits.   A: Pt did not appear sitmulable for skilled ST intervention. Will plan to continue to address deglutition and communication goals next session, as able. Trial low-tech AAC picture board to assess viability of this option for improving communication. Please note, pt missed 15 minutes of scheduled ST intervention due to fatigue and pt refusal to participate.  P: Pt left in bed with all safety measures activated. Call bell reviewed and within reach and all immediate needs met. Continue per current ST POC next session.  Pain No indication of pain; NAD  Therapy/Group: Individual Therapy  Loyda Costin A Joanthony Hamza 04/19/2022, 1:06 PM

## 2022-04-19 NOTE — Progress Notes (Signed)
Physical Therapy Session Note  Patient Details  Name: [name] Giles MRN: [id number] Date of Birth: 1956/03/20  Today's Date: 04/19/2022 PT Individual Time: 0800-0841 PT Individual Time Calculation (min): 41 min   Short Term Goals: Week 1:  PT Short Term Goal 1 (Week 1): STG = LTG due to ELOS  Skilled Therapeutic Interventions/Progress Updates:      Pt on COVID precautions (negative first test, awaiting 2nd test to confirm). Treatment in room only per protocol. Pt with no signs of pain. Pt remains severely aphasic with word salad speech. Difficult for her to follow simple instructions and responds better to TC vs verbal cueing. Remains apraxic with mobility as well. Supine there-ex completed as bellow; difficulty following instructions due to aphasia.   She was able to complete dressing at bed level with setupA only. Required minA for donning shoes but she was able to don socks with setupA via figure-4 technique while seated EOB.   Pt continent of bladder during session. Assistance levels described below.   Concluded session in bed with bed alarm on, all needs met.   Therapy Documentation Precautions:  Precautions Precautions: Fall Precaution Comments: expressive > receptive aphasia Restrictions Weight Bearing Restrictions: No General:   Mobility: Bed Mobility Bed Mobility: Supine to Sit Supine to Sit: Minimal Assistance - Patient > 75% (trunk support and initiation) Transfers Sit to Stand: Minimal Assistance - Patient > 75% Stand to Sit: Minimal Assistance - Patient > 75% Stand Pivot Transfers: Minimal Assistance - Patient > 75% Transfer (Assistive device): 1 person hand held assist Locomotion : Gait Gait Distance (Feet): 15 Feet x2 Assistive device: 1 person hand held assist Gait Assistance Details: Tactile cues for initiation;Tactile cues for sequencing;Tactile cues for weight shifting;Visual cues/gestures for precautions/safety;Verbal cues for  precautions/safety;Verbal cues for gait pattern Gait Gait Pattern: Impaired Gait Pattern: Step-through pattern;Shuffle;Narrow base of support;Lateral trunk lean to right   Exercises: General Exercises - Lower Extremity Short Arc Quad: Right;Left;10 reps;Supine Heel Slides: Right;Left;10 reps;Supine Straight Leg Raises: Right;Left;10 reps;Supine   Therapy/Group: Individual Therapy  Kamdyn Colborn P Twila Rappa 04/19/2022, 8:40 AM

## 2022-04-19 NOTE — Progress Notes (Signed)
PROGRESS NOTE   Subjective/Complaints: COVID test returned negative- still on precautions for total of 48 hours. Blood cultures negative, will consult ID regarding continued fevers.    ROS: Unable to obtain due to aphasia   Objective:   DG Chest 2 View  Result Date: 04/18/2022 CLINICAL DATA:  Recent intracranial hemorrhage.  Confusion.  Fever. EXAM: CHEST - 2 VIEW COMPARISON:  04/12/2022 FINDINGS: Heart size is normal. Aortic atherosclerotic calcification as seen previously. The right lung is clear. There is mild atelectasis at the left lung base. No consolidation or lobar collapse. Calcified hilar lymph nodes on the left as seen previously. No acute bone finding. IMPRESSION: Mild atelectasis at the left lung base.  Otherwise negative. Electronically Signed   By: Nelson Chimes M.D.   On: 04/18/2022 14:18   CT HEAD WO CONTRAST (5MM)  Result Date: 04/17/2022 CLINICAL DATA:  Mental status change, unknown cause EXAM: CT HEAD WITHOUT CONTRAST TECHNIQUE: Contiguous axial images were obtained from the base of the skull through the vertex without intravenous contrast. RADIATION DOSE REDUCTION: This exam was performed according to the departmental dose-optimization program which includes automated exposure control, adjustment of the mA and/or kV according to patient size and/or use of iterative reconstruction technique. COMPARISON:  CT 04/12/2022, CT angio head 03/30/2022, MRI head 03/30/2022. FINDINGS: Brain: No evidence of large-territorial acute infarction. Likely interval decrease in size of an at least 4.4 cm in length left temporal intraparenchymal hemorrhage with associated vasogenic edema. Persistent hyperdense component. Comparison of size of hemorrhagic components difficult given change in technique and scanning. No mass lesion. No extra-axial collection. Stable 8-9 mm left to right midline shift. No findings of uncal or transtentorial  herniation. No hydrocephalus. Basilar cisterns are patent. Vascular: No hyperdense vessel. Skull: Likely burr hole of the right skull. Sinuses/Orbits: Paranasal sinuses and mastoid air cells are clear. The orbits are unremarkable. Other: Skin staples overlie the right frontal scalp. IMPRESSION: Interval decrease in size of an at least 4.4 cm in length left temporal intraparenchymal hemorrhage with associated vasogenic edema. Persistent hyperdense component that may represent acute blood products. Stable 8-9 mm left to right midline shift. Underlying mass lesion not excluded. Known underlying vascular malformation better evaluated on CT angio head 03/30/2022. Electronically Signed   By: Iven Finn M.D.   On: 04/17/2022 16:04   Recent Labs    04/17/22 0520 04/18/22 1441  WBC 10.8* 10.2  HGB 11.4* 11.3*  HCT 34.2* 33.0*  PLT 356 363   Recent Labs    04/17/22 0520 04/18/22 1441  NA 137 137  K 3.9 3.8  CL 105 101  CO2 25 28  GLUCOSE 98 104*  BUN 12 15  CREATININE 0.63 0.65  CALCIUM 8.6* 8.8*    Intake/Output Summary (Last 24 hours) at 04/19/2022 6270 Last data filed at 04/19/2022 3500 Gross per 24 hour  Intake 634.67 ml  Output --  Net 634.67 ml        Physical Exam: Vital Signs Blood pressure (!) 157/72, pulse 96, temperature 98.8 F (37.1 C), temperature source Oral, resp. rate 18, SpO2 98 %. Gen: no distress, normal appearing, more fatigued today HEENT: oral mucosa pink and moist, NCAT  Cardio: Reg rate Chest: normal effort, normal rate of breathing Abd: soft, non-distended Ext: no edema Psych: pleasant, normal affect Skin:    General: Skin is warm and dry.  Neurological:     Mental Status: She is alert.     Comments: Patient is alert and comfortable.  Makes eye contact with examiner.  Difficulty following simple commands with extra time, repetition and cues. Would not cooperate consistently with CN exam. Word salad with occasional appropriate response to simple  questions (<10%). Moves all 4 limbs, senses pain. No abnl tone.   Psychiatric:     Comments: Seems generally up beat, engaging.     Assessment/Plan: 1. Functional deficits which require 3+ hours per day of interdisciplinary therapy in a comprehensive inpatient rehab setting. Physiatrist is providing close team supervision and 24 hour management of active medical problems listed below. Physiatrist and rehab team continue to assess barriers to discharge/monitor patient progress toward functional and medical goals  Care Tool:  Bathing    Body parts bathed by patient: Right arm, Left arm, Abdomen, Chest, Front perineal area, Right upper leg, Left upper leg, Right lower leg, Left lower leg, Face   Body parts bathed by helper: Buttocks     Bathing assist Assist Level: Minimal Assistance - Patient > 75%     Upper Body Dressing/Undressing Upper body dressing        Upper body assist Assist Level: Contact Guard/Touching assist    Lower Body Dressing/Undressing Lower body dressing      What is the patient wearing?: Underwear/pull up, Pants     Lower body assist Assist for lower body dressing: Minimal Assistance - Patient > 75%     Toileting Toileting    Toileting assist Assist for toileting: Minimal Assistance - Patient > 75%     Transfers Chair/bed transfer  Transfers assist  Chair/bed transfer activity did not occur: Safety/medical concerns  Chair/bed transfer assist level: Minimal Assistance - Patient > 75%     Locomotion Ambulation   Ambulation assist      Assist level: Minimal Assistance - Patient > 75% Assistive device: Hand held assist Max distance: 145f   Walk 10 feet activity   Assist     Assist level: Minimal Assistance - Patient > 75% Assistive device: Hand held assist   Walk 50 feet activity   Assist    Assist level: Minimal Assistance - Patient > 75% Assistive device: Hand held assist    Walk 150 feet activity   Assist     Assist level: Minimal Assistance - Patient > 75% Assistive device: Hand held assist    Walk 10 feet on uneven surface  activity   Assist     Assist level: Minimal Assistance - Patient > 75% Assistive device: Walker-rolling   Wheelchair     Assist Is the patient using a wheelchair?: No   Wheelchair activity did not occur: N/A         Wheelchair 50 feet with 2 turns activity    Assist    Wheelchair 50 feet with 2 turns activity did not occur: N/A       Wheelchair 150 feet activity     Assist  Wheelchair 150 feet activity did not occur: N/A       Blood pressure (!) 157/72, pulse 96, temperature 98.8 F (37.1 C), temperature source Oral, resp. rate 18, SpO2 98 %.  Medical Problem List and Plan: 1. 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  Antithrombotics: -DVT/anticoagulation:  Pharmaceutical: Heparin             -antiplatelet therapy: N/A 3. Pain Management: NA, d/c oxycodone 4. Insomnia: Melatonin 3 mg nightly.  Decrease Seroquel to 12.'5mg'$  HS             -monitor sleep habits 5. Neuropsych/cognition: This patient is not capable of making decisions on her own behalf. 6. Skin/Wound Care: Routine skin checks. Remove sutures/staples soon.  7. Fluids/Electrolytes/Nutrition: Routine in and outs follow-up chemistries 8.  Hyperlipidemia.  Crestor 9.  COPD with tobacco use.  Continue inhalers as directed.  Provide counts regards to cessation of nicotine products 10.  Hypotension: discontinue Norvasc 2.5 mg daily.  Monitor with increased mobility 11.  Urinary retention.   Urecholine 10 mg 3 times daily.  Check PVR' and emptying patterns.  12.  Hyperlipidemia.  Crestor 13.  Leukocytosis.   Maintained initially on empiric IV Rocephin and vancomycin.  Transitioned to Unasyn 04/16/2022 x 3 days and stop             -repeat CBC today 14.  Dysphagia.  Dysphagia #1 thin liquids.                -advance per SLP 15. Suboptimal vitamin D: start ergocalciferol 50,000U once per week for 7 weeks 16. Hypoalbuminemia: advised to choose high protein foods 17. Fall: CT head ordered and reviewed; shows improvements in size of bleed 18. Intermittent fevers: will consult ID    LOS: 3 days A FACE TO FACE EVALUATION WAS PERFORMED  Clide Deutscher Jamy Cleckler 04/19/2022, 9:27 AM

## 2022-04-19 NOTE — Progress Notes (Signed)
   04/18/22 1417  Assess: MEWS Score  Temp (!) 102 F (38.9 C)  BP (!) 132/57  MAP (mmHg) 79  Pulse Rate 88  Resp 17  SpO2 95 %  O2 Device Room Air  Assess: MEWS Score  MEWS Temp 2  MEWS Systolic 0  MEWS Pulse 0  MEWS RR 0  MEWS LOC 1  MEWS Score 3  MEWS Score Color Yellow  Assess: if the MEWS score is Yellow or Red  Were vital signs taken at a resting state? Yes  Focused Assessment No change from prior assessment  Does the patient meet 2 or more of the SIRS criteria? Yes  Does the patient have a confirmed or suspected source of infection? Yes  Provider and Rapid Response Notified? Yes Linna Hoff, PA)  MEWS guidelines implemented *See Row Information* Yes  Treat  Pain Scale 0-10  Pain Score 0  Take Vital Signs  Increase Vital Sign Frequency  Yellow: Q 2hr X 2 then Q 4hr X 2, if remains yellow, continue Q 4hrs  Escalate  MEWS: Escalate Yellow: discuss with charge nurse/RN and consider discussing with provider and RRT  Notify: Charge Nurse/RN  Name of Charge Nurse/RN Notified Angelina  Date Charge Nurse/RN Notified 04/18/22  Time Charge Nurse/RN Notified 1430  Notify: Provider  Provider Name/Title Helyn Numbers  Date Provider Notified 04/18/22  Time Provider Notified 1430  Method of Notification Face-to-face  Notification Reason Change in status  Provider response At bedside  Assess: SIRS CRITERIA  SIRS Temperature  1  SIRS Pulse 0  SIRS Respirations  0  SIRS WBC 1  SIRS Score Sum  2

## 2022-04-20 ENCOUNTER — Inpatient Hospital Stay (HOSPITAL_COMMUNITY): Payer: 59

## 2022-04-20 DIAGNOSIS — R7881 Bacteremia: Secondary | ICD-10-CM

## 2022-04-20 LAB — EPSTEIN-BARR VIRUS (EBV) ANTIBODY PROFILE
EBV NA IgG: >600 U/mL — ABNORMAL HIGH (ref 0.0–17.9)
EBV VCA IgG: >600 U/mL — ABNORMAL HIGH (ref 0.0–17.9)
EBV VCA IgM: <36 U/mL (ref 0.0–35.9)

## 2022-04-20 LAB — RHEUMATOID FACTOR: Rheumatoid fact SerPl-aCnc: <10 IU/mL (ref <14.0)

## 2022-04-20 LAB — HEPATITIS B SURFACE ANTIBODY, QUANTITATIVE: Hep B S AB Quant (Post): <3.1 m[IU]/mL — ABNORMAL LOW (ref >9.9)

## 2022-04-20 LAB — HCV INTERPRETATION

## 2022-04-20 LAB — HCV AB W REFLEX TO QUANT PCR: HCV Ab: NONREACTIVE

## 2022-04-20 LAB — SARS CORONAVIRUS 2 BY RT PCR: SARS Coronavirus 2 by RT PCR: NEGATIVE

## 2022-04-20 LAB — CMV ANTIBODY, IGG (EIA): CMV Ab - IgG: 8.3 U/mL — ABNORMAL HIGH (ref 0.00–0.59)

## 2022-04-20 LAB — CMV IGM: CMV IgM: <30 AU/mL (ref 0.0–29.9)

## 2022-04-20 LAB — ANA W/REFLEX IF POSITIVE: Anti Nuclear Antibody (ANA): NEGATIVE

## 2022-04-20 MED ORDER — GADOBUTROL 1 MMOL/ML IV SOLN
6.0000 mL | Freq: Once | INTRAVENOUS | Status: AC | PRN
  Administered 2022-04-20: 6 mL via INTRAVENOUS

## 2022-04-20 NOTE — Progress Notes (Addendum)
Subjective: No new complaints   Antibiotics:  Anti-infectives (From admission, onward)    Start     Dose/Rate Route Frequency Ordered Stop   04/19/22 2000  cefTRIAXone (ROCEPHIN) 2 g in sodium chloride 0.9 % 100 mL IVPB        2 g 200 mL/hr over 30 Minutes Intravenous Every 12 hours 04/19/22 1908     04/18/22 1800  ampicillin-sulbactam (UNASYN) 1.5 g in sodium chloride 0.9 % 100 mL IVPB  Status:  Discontinued        1.5 g 200 mL/hr over 30 Minutes Intravenous Every 6 hours 04/18/22 1420 04/18/22 1422   04/18/22 1600  ampicillin-sulbactam (UNASYN) 1.5 g in sodium chloride 0.9 % 100 mL IVPB  Status:  Discontinued        1.5 g 200 mL/hr over 30 Minutes Intravenous Every 6 hours 04/18/22 1422 04/19/22 1123   04/16/22 1930  ampicillin-sulbactam (UNASYN) 1.5 g in sodium chloride 0.9 % 100 mL IVPB  Status:  Discontinued        1.5 g 200 mL/hr over 30 Minutes Intravenous Every 6 hours 04/16/22 1737 04/18/22 1420       Medications: Scheduled Meds:  arformoterol  15 mcg Nebulization BID   bethanechol  10 mg Oral TID   budesonide (PULMICORT) nebulizer solution  0.25 mg Nebulization BID   feeding supplement  237 mL Oral BID BM   heparin injection (subcutaneous)  5,000 Units Subcutaneous Q8H   melatonin  3 mg Oral QHS   multivitamin with minerals  1 tablet Oral Daily   QUEtiapine  12.5 mg Oral QPM   revefenacin  175 mcg Nebulization Daily   rosuvastatin  20 mg Oral Daily   senna-docusate  1 tablet Oral BID   Vitamin D (Ergocalciferol)  50,000 Units Oral Q7 days   Continuous Infusions:  cefTRIAXone (ROCEPHIN)  IV 2 g (04/20/22 0820)   PRN Meds:.acetaminophen **OR** acetaminophen (TYLENOL) oral liquid 160 mg/5 mL **OR** acetaminophen, ipratropium-albuterol    Objective: Weight change:   Intake/Output Summary (Last 24 hours) at 04/20/2022 1131 Last data filed at 04/20/2022 0700 Gross per 24 hour  Intake 546 ml  Output --  Net 546 ml   Blood pressure 124/63, pulse 86,  temperature 98.4 F (36.9 C), temperature source Oral, resp. rate 16, SpO2 96 %. Temp:  [97.7 F (36.5 C)-98.7 F (37.1 C)] 98.4 F (36.9 C) (09/01 0552) Pulse Rate:  [83-86] 86 (09/01 0552) Resp:  [16-18] 16 (09/01 0552) BP: (124-139)/(58-66) 124/63 (09/01 0552) SpO2:  [96 %-97 %] 96 % (09/01 0824)  Physical Exam: Physical Exam Constitutional:      General: She is not in acute distress.    Appearance: She is well-developed. She is not diaphoretic.  HENT:     Head: Normocephalic.     Right Ear: External ear normal.     Left Ear: External ear normal.     Mouth/Throat:     Pharynx: No oropharyngeal exudate.  Eyes:     General: No scleral icterus.    Conjunctiva/sclera: Conjunctivae normal.     Pupils: Pupils are equal, round, and reactive to light.  Cardiovascular:     Rate and Rhythm: Normal rate and regular rhythm.     Heart sounds:     No friction rub.  Pulmonary:     Effort: Pulmonary effort is normal. No respiratory distress.     Breath sounds: No wheezing or rales.  Abdominal:     General: There  is no distension.     Palpations: Abdomen is soft.  Musculoskeletal:        General: No tenderness. Normal range of motion.  Lymphadenopathy:     Cervical: No cervical adenopathy.  Skin:    General: Skin is warm and dry.     Coloration: Skin is not pale.     Findings: No erythema or rash.  Neurological:     Mental Status: She is alert and oriented to person, place, and time.     Motor: No abnormal muscle tone.     Coordination: Coordination normal.  Psychiatric:        Mood and Affect: Mood normal.        Behavior: Behavior normal.        Thought Content: Thought content normal.        Cognition and Memory: Cognition normal.      CBC:    BMET Recent Labs    04/18/22 1441  NA 137  K 3.8  CL 101  CO2 28  GLUCOSE 104*  BUN 15  CREATININE 0.65  CALCIUM 8.8*     Liver Panel  No results for input(s): "PROT", "ALBUMIN", "AST", "ALT", "ALKPHOS",  "BILITOT", "BILIDIR", "IBILI" in the last 72 hours.     Sedimentation Rate Recent Labs    04/19/22 1325  ESRSEDRATE 58*   C-Reactive Protein Recent Labs    04/19/22 1325  CRP 0.7    Micro Results: Recent Results (from the past 720 hour(s))  SARS Coronavirus 2 by RT PCR (hospital order, performed in [facility] hospital lab) *cepheid single result test* Anterior Nasal Swab     Status: None   Collection Time: 03/29/22  3:35 PM   Specimen: Anterior Nasal Swab  Result Value Ref Range Status   SARS Coronavirus 2 by RT PCR NEGATIVE NEGATIVE Final    Comment: (NOTE) SARS-CoV-2 target nucleic acids are NOT DETECTED.  The SARS-CoV-2 RNA is generally detectable in upper and lower respiratory specimens during the acute phase of infection. The lowest concentration of SARS-CoV-2 viral copies this assay can detect is 250 copies / mL. A negative result does not preclude SARS-CoV-2 infection and should not be used as the sole basis for treatment or other patient management decisions.  A negative result may occur with improper specimen collection / handling, submission of specimen other than nasopharyngeal swab, presence of viral mutation(s) within the areas targeted by this assay, and inadequate number of viral copies (<250 copies / mL). A negative result must be combined with clinical observations, patient history, and epidemiological information.  Fact Sheet for Patients:   https://www.patel.info/  Fact Sheet for Healthcare Providers: https://hall.com/  This test is not yet approved or  cleared by the [place] FDA and has been authorized for detection and/or diagnosis of SARS-CoV-2 by FDA under an Emergency Use Authorization (EUA).  This EUA will remain in effect (meaning this test can be used) for the duration of the COVID-19 declaration under Section 564(b)(1) of the Act, 21 U.S.C. section 360bbb-3(b)(1), unless the authorization  is terminated or revoked sooner.  Performed at [facility], [address] [place], [place] [address]   MRSA Next Gen by PCR, Nasal     Status: None   Collection Time: 03/29/22  8:40 PM   Specimen: Nasal Mucosa; Nasal Swab  Result Value Ref Range Status   MRSA by PCR Next Gen NOT DETECTED NOT DETECTED Final    Comment: (NOTE) The GeneXpert MRSA Assay (FDA approved for NASAL  specimens only), is one component of a comprehensive MRSA colonization surveillance program. It is not intended to diagnose MRSA infection nor to guide or monitor treatment for MRSA infections. Test performance is not FDA approved in patients less than 72 years old. Performed at [facility], [place] [address] [place], [place] [address]   Culture, blood (Routine X 2) w Reflex to ID Panel     Status: None   Collection Time: 04/12/22  8:49 AM   Specimen: BLOOD  Result Value Ref Range Status   Specimen Description BLOOD SITE NOT SPECIFIED  Final   Special Requests   Final    BOTTLES DRAWN AEROBIC AND ANAEROBIC Blood Culture adequate volume   Culture   Final    NO GROWTH 5 DAYS Performed at [facility], [address], [place], [place] [address]    Report Status 04/17/2022 FINAL  Final  Culture, blood (Routine X 2) w Reflex to ID Panel     Status: None   Collection Time: 04/12/22  8:49 AM   Specimen: BLOOD  Result Value Ref Range Status   Specimen Description BLOOD SITE NOT SPECIFIED  Final   Special Requests   Final    BOTTLES DRAWN AEROBIC AND ANAEROBIC Blood Culture adequate volume   Culture   Final    NO GROWTH 5 DAYS Performed at [facility], [place] [address], [place], [place] [address]    Report Status 04/17/2022 FINAL  Final  Urine Culture     Status: None   Collection Time: 04/18/22  3:25 AM   Specimen: Urine, Clean Catch  Result Value Ref Range Status   Specimen Description URINE, CLEAN CATCH  Final   Special Requests NONE  Final   Culture   Final    NO  GROWTH Performed at [facility], [place] [address], [place], [place] [address]    Report Status 04/19/2022 FINAL  Final  Culture, blood (Routine X 2) w Reflex to ID Panel     Status: None (Preliminary result)   Collection Time: 04/18/22  2:56 PM   Specimen: BLOOD  Result Value Ref Range Status   Specimen Description BLOOD LEFT ANTECUBITAL  Final   Special Requests   Final    BOTTLES DRAWN AEROBIC AND ANAEROBIC Blood Culture adequate volume   Culture   Final    NO GROWTH 2 DAYS Performed at [facility], [place] [address] [place], [place] [address]    Report Status PENDING  Incomplete  Culture, blood (Routine X 2) w Reflex to ID Panel     Status: None (Preliminary result)   Collection Time: 04/18/22  2:57 PM   Specimen: BLOOD  Result Value Ref Range Status   Specimen Description BLOOD LEFT ANTECUBITAL  Final   Special Requests   Final    BOTTLES DRAWN AEROBIC AND ANAEROBIC Blood Culture adequate volume   Culture  Setup Time   Final    GRAM NEGATIVE RODS AEROBIC BOTTLE ONLY CRITICAL RESULT CALLED TO, READ BACK BY AND VERIFIED WITH: PHARMD FRANK WILSON  ON 04/19/22 @ 1804 BY DRT    Culture   Final    GRAM NEGATIVE RODS IDENTIFICATION TO FOLLOW Performed at [facility], [place] [address], [place],  [address]    Report Status PENDING  Incomplete  Blood Culture ID Panel (Reflexed)     Status: None   Collection Time: 04/18/22  2:57 PM  Result Value Ref Range Status  Enterococcus faecalis NOT DETECTED NOT DETECTED Final   Enterococcus Faecium NOT DETECTED NOT DETECTED Final   Listeria monocytogenes NOT DETECTED NOT DETECTED Final   Staphylococcus species NOT DETECTED NOT DETECTED Final   Staphylococcus aureus (BCID) NOT DETECTED NOT DETECTED Final   Staphylococcus epidermidis NOT DETECTED NOT DETECTED Final   Staphylococcus lugdunensis NOT DETECTED NOT DETECTED Final   Streptococcus species NOT DETECTED NOT DETECTED Final   Streptococcus agalactiae NOT DETECTED  NOT DETECTED Final   Streptococcus pneumoniae NOT DETECTED NOT DETECTED Final   Streptococcus pyogenes NOT DETECTED NOT DETECTED Final   A.calcoaceticus-baumannii NOT DETECTED NOT DETECTED Final   Bacteroides fragilis NOT DETECTED NOT DETECTED Final   Enterobacterales NOT DETECTED NOT DETECTED Final   Enterobacter cloacae complex NOT DETECTED NOT DETECTED Final   Escherichia coli NOT DETECTED NOT DETECTED Final   Klebsiella aerogenes NOT DETECTED NOT DETECTED Final   Klebsiella oxytoca NOT DETECTED NOT DETECTED Final   Klebsiella pneumoniae NOT DETECTED NOT DETECTED Final   Proteus species NOT DETECTED NOT DETECTED Final   Salmonella species NOT DETECTED NOT DETECTED Final   Serratia marcescens NOT DETECTED NOT DETECTED Final   Haemophilus influenzae NOT DETECTED NOT DETECTED Final   Neisseria meningitidis NOT DETECTED NOT DETECTED Final   Pseudomonas aeruginosa NOT DETECTED NOT DETECTED Final   Stenotrophomonas maltophilia NOT DETECTED NOT DETECTED Final   Candida albicans NOT DETECTED NOT DETECTED Final   Candida auris NOT DETECTED NOT DETECTED Final   Candida glabrata NOT DETECTED NOT DETECTED Final   Candida krusei NOT DETECTED NOT DETECTED Final   Candida parapsilosis NOT DETECTED NOT DETECTED Final   Candida tropicalis NOT DETECTED NOT DETECTED Final   Cryptococcus neoformans/gattii NOT DETECTED NOT DETECTED Final    Comment: Performed at [facility], [address], [place], [place] [address]  SARS Coronavirus 2 by RT PCR (hospital order, performed in [facility] hospital lab) *cepheid single result test* Anterior Nasal Swab     Status: None   Collection Time: 04/18/22  3:03 PM   Specimen: Anterior Nasal Swab  Result Value Ref Range Status   SARS Coronavirus 2 by RT PCR NEGATIVE NEGATIVE Final    Comment: (NOTE) SARS-CoV-2 target nucleic acids are NOT DETECTED.  The SARS-CoV-2 RNA is generally detectable in upper and lower respiratory specimens during the acute phase  of infection. The lowest concentration of SARS-CoV-2 viral copies this assay can detect is 250 copies / mL. A negative result does not preclude SARS-CoV-2 infection and should not be used as the sole basis for treatment or other patient management decisions.  A negative result may occur with improper specimen collection / handling, submission of specimen other than nasopharyngeal swab, presence of viral mutation(s) within the areas targeted by this assay, and inadequate number of viral copies (<250 copies / mL). A negative result must be combined with clinical observations, patient history, and epidemiological information.  Fact Sheet for Patients:   https://www.patel.info/  Fact Sheet for Healthcare Providers: https://hall.com/  This test is not yet approved or  cleared by the [place] FDA and has been authorized for detection and/or diagnosis of SARS-CoV-2 by FDA under an Emergency Use Authorization (EUA).  This EUA will remain in effect (meaning this test can be used) for the duration of the COVID-19 declaration under Section 564(b)(1) of the Act, 21 U.S.C. section 360bbb-3(b)(1), unless the authorization is terminated or revoked sooner.  Performed at [facility], [place] [address], [place], [place] [address]  SARS Coronavirus 2 by RT PCR (hospital order, performed in [facility] hospital lab) *cepheid single result test* Anterior Nasal Swab     Status: None   Collection Time: 04/20/22 10:26 AM   Specimen: Anterior Nasal Swab  Result Value Ref Range Status   SARS Coronavirus 2 by RT PCR NEGATIVE NEGATIVE Final    Comment: (NOTE) SARS-CoV-2 target nucleic acids are NOT DETECTED.  The SARS-CoV-2 RNA is generally detectable in upper and lower respiratory specimens during the acute phase of infection. The lowest concentration of SARS-CoV-2 viral copies this assay can detect is 250 copies / mL. A negative result does not  preclude SARS-CoV-2 infection and should not be used as the sole basis for treatment or other patient management decisions.  A negative result may occur with improper specimen collection / handling, submission of specimen other than nasopharyngeal swab, presence of viral mutation(s) within the areas targeted by this assay, and inadequate number of viral copies (<250 copies / mL). A negative result must be combined with clinical observations, patient history, and epidemiological information.  Fact Sheet for Patients:   https://www.patel.info/  Fact Sheet for Healthcare Providers: https://hall.com/  This test is not yet approved or  cleared by the [place] FDA and has been authorized for detection and/or diagnosis of SARS-CoV-2 by FDA under an Emergency Use Authorization (EUA).  This EUA will remain in effect (meaning this test can be used) for the duration of the COVID-19 declaration under Section 564(b)(1) of the Act, 21 U.S.C. section 360bbb-3(b)(1), unless the authorization is terminated or revoked sooner.  Performed at [facility], [place] [address] [place], [place] [address]     Studies/Results: CT CHEST ABDOMEN PELVIS W CONTRAST  Result Date: 04/19/2022 CLINICAL DATA:  Sepsis, fever of unknown origin EXAM: CT CHEST, ABDOMEN, AND PELVIS WITH CONTRAST TECHNIQUE: Multidetector CT imaging of the chest, abdomen and pelvis was performed following the standard protocol during bolus administration of intravenous contrast. RADIATION DOSE REDUCTION: This exam was performed according to the departmental dose-optimization program which includes automated exposure control, adjustment of the mA and/or kV according to patient size and/or use of iterative reconstruction technique. CONTRAST:  129m OMNIPAQUE IOHEXOL 300 MG/ML SOLN IV. Dilute oral contrast. COMPARISON:  None Available. FINDINGS: CT CHEST FINDINGS Cardiovascular: Atherosclerotic  calcifications of thoracic aorta, coronary arteries, and proximal great vessels. Aorta normal caliber. Heart size normal. No pericardial effusion. Pulmonary arteries patent on non targeted exam. Mediastinum/Nodes: Esophagus unremarkable. No thoracic adenopathy. Base of cervical region normal appearance. Subsegmental atelectasis RIGHT lower lobe dependently. Calcified LEFT hilar lymph node. Lungs/Pleura: Subsegmental atelectasis RIGHT lower lobe dependently. Minimal biapical scarring. Lungs otherwise clear. No pulmonary infiltrate, pleural effusion, or pneumothorax. No definite pulmonary mass/nodule. Musculoskeletal: Osseous structures unremarkable. CT ABDOMEN PELVIS FINDINGS Hepatobiliary: Hepatic steatosis. Hepatic cyst lateral segment LEFT lobe 2.3 x 1.9 cm image 56. Multiple additional smaller low-attenuation lesions, likely cysts though these are more difficult to characterize due to the small sizes. Gallbladder unremarkable. No biliary dilatation. Pancreas: Normal appearance Spleen: Normal appearance Adrenals/Urinary Tract: Tiny LEFT adrenal nodule 8 x 6 mm, measuring 38 HU; this is probably benign and no follow-up imaging is recommended. Remainder of LEFT adrenal gland is somewhat thickened without additional discrete nodule. RIGHT adrenal gland normal appearance. Kidneys, ureters, and bladder normal appearance Stomach/Bowel: Normal appendix. Mobile cecum to midline. Stomach and bowel loops otherwise normal appearance. Vascular/Lymphatic: Atherosclerotic calcifications aorta and iliac arteries without aneurysm. No adenopathy. Reproductive: Uterus and ovaries normal appearance Other: No free air  or free fluid. Tiny periumbilical hernia containing fat. No definite inflammatory process. Musculoskeletal: Osseous demineralization. IMPRESSION: Scattered subsegmental atelectasis in both lungs. Hepatic steatosis with multiple hepatic cysts. Tiny periumbilical hernia containing fat. No acute abnormalities. Aortic  Atherosclerosis (ICD10-I70.0). Electronically Signed   By: Lavonia Dana M.D.   On: 04/19/2022 17:11   DG Chest 2 View  Result Date: 04/18/2022 CLINICAL DATA:  Recent intracranial hemorrhage.  Confusion.  Fever. EXAM: CHEST - 2 VIEW COMPARISON:  04/12/2022 FINDINGS: Heart size is normal. Aortic atherosclerotic calcification as seen previously. The right lung is clear. There is mild atelectasis at the left lung base. No consolidation or lobar collapse. Calcified hilar lymph nodes on the left as seen previously. No acute bone finding. IMPRESSION: Mild atelectasis at the left lung base.  Otherwise negative. Electronically Signed   By: Nelson Chimes M.D.   On: 04/18/2022 14:18      Assessment/Plan:  INTERVAL HISTORY: patient dramatically improved cognitively   Principal Problem:   ICH (intracerebral hemorrhage) ([place])    [name] Giles is a 66 y.o. 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  #1 FUO:  CT chest, abdomen and pelvis completely unrevealing  I doubt serologies and other FUO labs will be very helpful  There is GNR in 1 out of 2 cultures taken--though I suspect it is a contaminant  If fevers recur could get an MRI of the brain with contrast  I REALLY believe the fevers are due to the [place] itself  #2 GNR in blood culture   I suspect this is a contaminant but will continue ceftriaxone for now, will likely stop tomorrow  I spent 36 minutes with the patient including than 50% of the time in face to face counseling of the patient re her FUO, personally reviewing CT chest, abdomen pelvis along with review of medical records in preparation for the visit and during the visit and in coordination of his care.  My partner  Dr. Gale Journey is on over Labor Day weekend and will follow-up fever curve, culture data and will be available for questions.     LOS: 4 days   Alcide Evener 04/20/2022, 11:31 AM

## 2022-04-20 NOTE — Progress Notes (Signed)
Physical Therapy Session Note  Patient Details  Name: [name] Giles MRN: [id number] Date of Birth: 20-May-1956  Today's Date: 04/20/2022 PT Individual Time: 0915-0956 PT Individual Time Calculation (min): 41 min   Short Term Goals: Week 1:  PT Short Term Goal 1 (Week 1): STG = LTG due to ELOS  Skilled Therapeutic Interventions/Progress Updates:      Pt seen supine in bed sleeping - awakens easily to voice and appears agreeable to PT tx. Per CIR protocol, in-room therapy only, awaiting 2nd COVID testing. Pt does not appear to have pain during treatment session. Continues to have severe expressive aphasia (word salad) but her sentences seem more formed compared to prior sessions. She also seems to understand verbal instructions better than before. She also presents with improved activity tolerance and alertness compared to prior sessions.   Supine<>sitting EOB with minA for initiation only. Able to scoot herself to EOB without assist. She donned shorts with setupA only while seated EOB. Sit<>Stand with CGA and HHA - pt reporting need to void. Ambulating with HHA and CGA to bathroom and pt able to manage 3/3 toileting tasks without assist - continent of bladder and charted in flowsheets.  Completed self care tasks (washing face and brushing teeth) at the sink while standing with supervision. She initially tried to place [employer] on her toothbrush and required intervention to prevent this - otherwise she completed tasks without assist.  Infectious Disease MD entering session to discuss fever workup.  Instructed in repeated sit<>stands from EOB with no AD and with supervision assist - completed 1x10 with cues for hand placement.  She was able to be instructed in balance testing with eyes open/closed with feet apart on firm surface - completed 20 seconds for both without UE support and supervision assist. Pt then reporting need to urgently use restroom again, this time for BM. Ambulated with  CGA and HHA to bathroom and pt continent of stool on toilet.   Returned to bed with CGA and HHA. Bed mobility completed without assist. Bed alarm on, all needs met at end of session.   Therapy Documentation Precautions:  Precautions Precautions: Fall Precaution Comments: expressive > receptive aphasia Restrictions Weight Bearing Restrictions: No General:     Therapy/Group: Individual Therapy  Alger Simons 04/20/2022, 7:38 AM

## 2022-04-20 NOTE — Progress Notes (Addendum)
Patient ID: [name] Giles, female   DOB: 11/28/1955, 66 y.o.   MRN: [id number]  Spoke with  [name] Giles to update regarding COVID testing and hopefully today's test will be negative and she can come off isolation. Do feel son and she need to come in tomorrow for education and will leave it scheduled. Have asked team regarding extension due to missed therapies and confines of having it in her room due to isolation. Aware of ID beng consulted to figure out where the fevers are coming from. Will follow up later today regarding test results from second COVID test.  12;18 PM left message for sister to let her know COVID test negative and isolation lifted. She ad son will plan to be here tomorrow for education. Team still evaluating if needs extension of stay.

## 2022-04-20 NOTE — Progress Notes (Signed)
PROGRESS NOTE   Subjective/Complaints: Did better with therapy this morning, can extend one day given time lost in therapy due to COVID restrictions Appreciate ID follow-up   ROS: Unable to obtain due to aphasia   Objective:   CT CHEST ABDOMEN PELVIS W CONTRAST  Result Date: 04/19/2022 CLINICAL DATA:  Sepsis, fever of unknown origin EXAM: CT CHEST, ABDOMEN, AND PELVIS WITH CONTRAST TECHNIQUE: Multidetector CT imaging of the chest, abdomen and pelvis was performed following the standard protocol during bolus administration of intravenous contrast. RADIATION DOSE REDUCTION: This exam was performed according to the departmental dose-optimization program which includes automated exposure control, adjustment of the mA and/or kV according to patient size and/or use of iterative reconstruction technique. CONTRAST:  150m OMNIPAQUE IOHEXOL 300 MG/ML SOLN IV. Dilute oral contrast. COMPARISON:  None Available. FINDINGS: CT CHEST FINDINGS Cardiovascular: Atherosclerotic calcifications of thoracic aorta, coronary arteries, and proximal great vessels. Aorta normal caliber. Heart size normal. No pericardial effusion. Pulmonary arteries patent on non targeted exam. Mediastinum/Nodes: Esophagus unremarkable. No thoracic adenopathy. Base of cervical region normal appearance. Subsegmental atelectasis RIGHT lower lobe dependently. Calcified LEFT hilar lymph node. Lungs/Pleura: Subsegmental atelectasis RIGHT lower lobe dependently. Minimal biapical scarring. Lungs otherwise clear. No pulmonary infiltrate, pleural effusion, or pneumothorax. No definite pulmonary mass/nodule. Musculoskeletal: Osseous structures unremarkable. CT ABDOMEN PELVIS FINDINGS Hepatobiliary: Hepatic steatosis. Hepatic cyst lateral segment LEFT lobe 2.3 x 1.9 cm image 56. Multiple additional smaller low-attenuation lesions, likely cysts though these are more difficult to characterize due to  the small sizes. Gallbladder unremarkable. No biliary dilatation. Pancreas: Normal appearance Spleen: Normal appearance Adrenals/Urinary Tract: Tiny LEFT adrenal nodule 8 x 6 mm, measuring 38 HU; this is probably benign and no follow-up imaging is recommended. Remainder of LEFT adrenal gland is somewhat thickened without additional discrete nodule. RIGHT adrenal gland normal appearance. Kidneys, ureters, and bladder normal appearance Stomach/Bowel: Normal appendix. Mobile cecum to midline. Stomach and bowel loops otherwise normal appearance. Vascular/Lymphatic: Atherosclerotic calcifications aorta and iliac arteries without aneurysm. No adenopathy. Reproductive: Uterus and ovaries normal appearance Other: No free air or free fluid. Tiny periumbilical hernia containing fat. No definite inflammatory process. Musculoskeletal: Osseous demineralization. IMPRESSION: Scattered subsegmental atelectasis in both lungs. Hepatic steatosis with multiple hepatic cysts. Tiny periumbilical hernia containing fat. No acute abnormalities. Aortic Atherosclerosis (ICD10-I70.0). Electronically Signed   By: MLavonia DanaM.D.   On: 04/19/2022 17:11   DG Chest 2 View  Result Date: 04/18/2022 CLINICAL DATA:  Recent intracranial hemorrhage.  Confusion.  Fever. EXAM: CHEST - 2 VIEW COMPARISON:  04/12/2022 FINDINGS: Heart size is normal. Aortic atherosclerotic calcification as seen previously. The right lung is clear. There is mild atelectasis at the left lung base. No consolidation or lobar collapse. Calcified hilar lymph nodes on the left as seen previously. No acute bone finding. IMPRESSION: Mild atelectasis at the left lung base.  Otherwise negative. Electronically Signed   By: MNelson ChimesM.D.   On: 04/18/2022 14:18   Recent Labs    04/18/22 1441 04/19/22 1155  WBC 10.2 10.3  HGB 11.3* 10.9*  HCT 33.0* 32.6*  PLT 363 340   Recent Labs    04/18/22 1441  NA 137  K 3.8  CL 101  CO2 28  GLUCOSE 104*  BUN 15  CREATININE  0.65  CALCIUM 8.8*    Intake/Output Summary (Last 24 hours) at 04/20/2022 1338 Last data filed at 04/20/2022 1200 Gross per 24 hour  Intake 726 ml  Output --  Net 726 ml        Physical Exam: Vital Signs Blood pressure 124/63, pulse 86, temperature 98.4 F (36.9 C), temperature source Oral, resp. rate 16, SpO2 96 %. Gen: no distress, normal appearing, more fatigued today HEENT: oral mucosa pink and moist, NCAT Cardio: Reg rate Chest: normal effort, normal rate of breathing Abd: soft, non-distended Ext: no edema Psych: pleasant, normal affect Skin:    General: Skin is warm and dry.  Neurological:     Mental Status: She is alert.     Comments: Patient is alert and comfortable.  Makes eye contact with examiner.  Difficulty following simple commands with extra time, repetition and cues. Would not cooperate consistently with CN exam. Word salad with occasional appropriate response to simple questions (<10%). Sentence structure improving. Moves all 4 limbs, senses pain. No abnl tone.   Psychiatric:     Comments: Seems generally up beat, engaging.     Assessment/Plan: 1. Functional deficits which require 3+ hours per day of interdisciplinary therapy in a comprehensive inpatient rehab setting. Physiatrist is providing close team supervision and 24 hour management of active medical problems listed below. Physiatrist and rehab team continue to assess barriers to discharge/monitor patient progress toward functional and medical goals  Care Tool:  Bathing    Body parts bathed by patient: Right arm, Left arm, Abdomen, Chest, Front perineal area, Right upper leg, Left upper leg, Right lower leg, Left lower leg, Face   Body parts bathed by helper: Buttocks     Bathing assist Assist Level: Minimal Assistance - Patient > 75%     Upper Body Dressing/Undressing Upper body dressing        Upper body assist Assist Level: Contact Guard/Touching assist    Lower Body  Dressing/Undressing Lower body dressing      What is the patient wearing?: Underwear/pull up, Pants     Lower body assist Assist for lower body dressing: Minimal Assistance - Patient > 75%     Toileting Toileting    Toileting assist Assist for toileting: Minimal Assistance - Patient > 75%     Transfers Chair/bed transfer  Transfers assist  Chair/bed transfer activity did not occur: Safety/medical concerns  Chair/bed transfer assist level: Minimal Assistance - Patient > 75%     Locomotion Ambulation   Ambulation assist      Assist level: Minimal Assistance - Patient > 75% Assistive device: Hand held assist Max distance: 177f   Walk 10 feet activity   Assist     Assist level: Minimal Assistance - Patient > 75% Assistive device: Hand held assist   Walk 50 feet activity   Assist    Assist level: Minimal Assistance - Patient > 75% Assistive device: Hand held assist    Walk 150 feet activity   Assist    Assist level: Minimal Assistance - Patient > 75% Assistive device: Hand held assist    Walk 10 feet on uneven surface  activity   Assist     Assist level: Minimal Assistance - Patient > 75% Assistive device: Walker-rolling   Wheelchair     Assist Is the patient using a wheelchair?: No   Wheelchair activity did not occur: N/A  Wheelchair 50 feet with 2 turns activity    Assist    Wheelchair 50 feet with 2 turns activity did not occur: N/A       Wheelchair 150 feet activity     Assist  Wheelchair 150 feet activity did not occur: N/A       Blood pressure 124/63, pulse 86, temperature 98.4 F (36.9 C), temperature source Oral, resp. rate 16, SpO2 96 %.  Medical Problem List and Plan: 1. 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   -WILL NEED FORMAL CATHETER ANGIOGRAM BEFORE DISCHARGE FROM CIR PER DR [id number], will discuss with  NSGY that it is unlikely that patient's cognition will improve prior to discharge             -patient may shower             -ELOS/Goals: 9 days, supervision goals with PT, OT, mod assist with language 2.  Antithrombotics: -DVT/anticoagulation:  Pharmaceutical: Heparin             -antiplatelet therapy: N/A 3. Pain Management: NA, d/c oxycodone 4. Insomnia: Melatonin 3 mg nightly.  Decrease Seroquel to 12.'5mg'$  HS             -monitor sleep habits 5. Neuropsych/cognition: This patient is not capable of making decisions on her own behalf. 6. Skin/Wound Care: Routine skin checks. Remove sutures/staples soon.  7. Fluids/Electrolytes/Nutrition: Routine in and outs follow-up chemistries 8.  Hyperlipidemia.  Crestor 9.  COPD with tobacco use.  Continue inhalers as directed.  Provide counts regards to cessation of nicotine products 10.  Hypotension: discontinue Norvasc 2.5 mg daily.  Monitor with increased mobility 11.  Urinary retention.  Urecholine 10 mg 3 times daily.  Check PVR' and emptying patterns.  12.  Hyperlipidemia.  Crestor 13.  Leukocytosis.   Maintained initially on empiric IV Rocephin and vancomycin.  Transitioned to Unasyn 04/16/2022 x 3 days and stop 14.  Dysphagia.  upgraded to D2 15. Suboptimal vitamin D: continue ergocalciferol 50,000U once per week for 7 weeks 16. Hypoalbuminemia: advised to choose high protein foods 17. Fall: CT head ordered and reviewed; shows improvements in size of bleed 18. Intermittent fevers: will consult ID, likely secondary to the [place] as per IF, will obtain MRI w/ contrast to r/o abscess    LOS: 4 days A FACE TO FACE EVALUATION WAS PERFORMED  Martha Clan P Oseas Detty 04/20/2022, 1:38 PM

## 2022-04-20 NOTE — Progress Notes (Signed)
Speech Language Pathology Daily Session Note  Patient Details  Name: [name] Giles MRN: [id number] Date of Birth: 10/30/1955  Today's Date: 04/20/2022 SLP Individual Time: 1101-1200 SLP Individual Time Calculation (min): 59 min  Short Term Goals: Week 1: SLP Short Term Goal 1 (Week 1): STGs=LTGs due to ELOS  Skilled Therapeutic Interventions: Pt seen for skilled ST with focus speech and swallowing goals, pt in bed and agreeable to therapeutic tasks. SLP initially donning appropriate PPE, however precautions lifted during session and discarded. SLP facilitating trial of Dys 2 snack by providing min A verbal cues to understanding, recall and utilize standard swallow precautions. Pt consuming Dys 2 texture with timely mastication and A-P transit, attentive to bolus in low stim environment with no overt s/s aspiration or change in vocal quality. Pt consuming ~6oz thin liquid via straw with apparent functional oropharyngeal swallow. Pt continues with significant language of confusion and fluent aphasic speech characteristics, however sentence structure and topic maintenance appear improved today. Speech production is significantly reduced during structured speech tasks vs unstructured. SLP attempting to initiate low level AAC board, however pt unable to ID/point to correct pictures despite max A multimodal cues. Pt also appears to demonstrate R inattention impacting effective use of AAC at this time. SLP provided patient vertical yes/no communication board for wants/needs with staff and family, will need further training. Pt answering biographical yes/no questions ~50% accurate with mod A cues. Pt left in bed with all needs met and alarm set, cont ST POC.  Pain Pain Assessment Pain Scale: 0-10 Pain Score: 0-No pain  Therapy/Group: Individual Therapy  Dewaine Conger 04/20/2022, 12:07 PM

## 2022-04-20 NOTE — Progress Notes (Signed)
Occupational Therapy Session Note  Patient Details  Name: [name] Giles MRN: [id number] Date of Birth: [birth year]  Today's Date: 04/20/2022 OT Individual Time: 1300-1345 OT Individual Time Calculation (min): 45 min    Short Term Goals: Week 1:  OT Short Term Goal 1 (Week 1): STG = LTG d/t ELOS  Skilled Therapeutic Interventions/Progress Updates:    Upon OT arrival, pt semi recumbent in bed reporting no pain initially but further states "I am so tired and don't feel well". Pt requires encouragement and education on benefits of engaging OT services. Pt eventually agreeable to OT treatment. Treatment intervention with a focus on self care retraining and cognition. Pt completes supine to sit transfer with Supervision and donn's shoes with Min A seated EOB. Increased time required. Pt completes sit to stand transfer with CGA and ambulates to bathroom with RW and CGA to complete toilet transfer and toileting with CGA. Pt requires min verbal cues for sequencing. Pt ambulates to sink with CGA using RW and performs hand hygiene and oral care with CGA and verbal cues. Pt ambulates to dayroom gym with RW and CGA. Pt stands at tabletop to remove squigz from tabletop based on color therapist names and returns them to the container. Pt with Mod errors requiring verbal cues to recognize and correct errors. Pt requires seated rest break after task and ambulates back to her room with CGA using RW. Pt returns to her bed with Supervision and was left in bed with all needs met and safety measures in place.   Therapy Documentation Precautions:  Precautions Precautions: Fall Precaution Comments: expressive > receptive aphasia Restrictions Weight Bearing Restrictions: No   Therapy/Group: Individual Therapy  [name] 04/20/2022, 2:35 PM

## 2022-04-21 MED ORDER — ORAL CARE MOUTH RINSE
15.0000 mL | OROMUCOSAL | Status: DC
  Administered 2022-04-21 – 2022-04-27 (×18): 15 mL via OROMUCOSAL

## 2022-04-21 MED ORDER — ORAL CARE MOUTH RINSE: 15.0000 mL | OROMUCOSAL | Status: DC | PRN

## 2022-04-21 NOTE — Progress Notes (Signed)
Physical Therapy Session Note  Patient Details  Name: [name] Giles MRN: [id number] Date of Birth: 1956-01-20  Today's Date: 04/21/2022 PT Individual Time: 0831-0912 PT Individual Time Calculation (min): 41 min   Short Term Goals: Week 1:  PT Short Term Goal 1 (Week 1): STG = LTG due to ELOS  Skilled Therapeutic Interventions/Progress Updates:   Pt received supine in bed and agreeable to PT. Supine>sit transfer without assist or cues from PT. Pt reports need to void urine.  Ambulatory transfer to toilet with HHA on the RUE. Able to void and perform all clothing and preicare with distant supervision assist from PT. Hand and oral hygiene at sink with set up assist from PT.   Gait training with RW through rehab unit x 130f and 1486fwith RW for safety and direction no LOB noted.   Dynamic balance training. To perform bean bag toss to target standing on airex pad. X 8 BUE CGA for balance from PT for safety. No UE support on RW throughout  Pt returned to room and performed Ambulatory transfer to bed with RW and supervision assist for safety and direction into room. Sit>supine completed without assist, and left supine in bed with call bell in reach and all needs met.        Therapy Documentation Precautions:  Precautions Precautions: Fall Precaution Comments: expressive > receptive aphasia Restrictions Weight Bearing Restrictions: No  Vital Signs: Therapy Vitals Pulse Rate: 82 Resp: 15 Patient Position (if appropriate): Lying Oxygen Therapy SpO2: 99 % O2 Device: Room Air Pain: denies    Therapy/Group: Individual Therapy  AuLorie Phenix/09/2021, 9:13 AM

## 2022-04-21 NOTE — Progress Notes (Signed)
Physical Therapy Session Note  Patient Details  Name: [name] Giles MRN: [id number] Date of Birth: [birth year]  Today's Date: 04/21/2022 PT Individual Time: 9136-8599 PT Individual Time Calculation (min): 43 min   Short Term Goals: Week 1:  PT Short Term Goal 1 (Week 1): STG = LTG due to ELOS  Skilled Therapeutic Interventions/Progress Updates:   Pt received supine in bed and agreeable to PT. Supine>sit transfer without assist and or cues from PT. Pt's family present for education. Pt able to don shoes sitting EOB with set up Assist. Stand pivot transfer to [facility] with supervision assist. Pt transported to rehab gym in [facility]. Gait training with RW x 100 and without AD x 40. Supervision assist with RW and min assist without AD.  Stair management training with BUE support x 4 with assist from PT and x 4 with assist from familty member. Car transfer training with RW  and supervision assist into car. Min assist to exit car as pt demonstrating difficulty with understanding instruction to exit car. Additional gait training with Rollator in hall x 170f with supervision assist, but pt unable to follow instructions to AD parts management. Pt returned to room and performed stand pivot transfer to bed with CGA from family. Sit>supine completed without assist, and left supine in bed with call bell in reach and all needs met.           Therapy Documentation Precautions:  Precautions Precautions: Fall Precaution Comments: expressive > receptive aphasia Restrictions Weight Bearing Restrictions: No   Pain:  denies   Therapy/Group: Individual Therapy  ALorie Phenix9/09/2021, 2:34 PM

## 2022-04-21 NOTE — Progress Notes (Signed)
PROGRESS NOTE   Subjective/Complaints:  Pt confused- speaking confusing /garbled words.   Per nursing, GNR on blood Cx's- reincubated- pending.  Per nurse, is at baseline  ROS:   Limited by aphasia/cognition Objective:   MR BRAIN W WO CONTRAST  Result Date: 04/20/2022 CLINICAL DATA:  Mental status change, unknown cause. Fevers of unknown origin. 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 EXAM: MRI HEAD WITHOUT AND WITH CONTRAST TECHNIQUE: Multiplanar, multiecho pulse sequences of the brain and surrounding structures were obtained without and with intravenous contrast. CONTRAST:  88m GADAVIST GADOBUTROL 1 MMOL/ML IV SOLN COMPARISON:  Head CT 04/17/2022, CTA 03/30/2022, and MRI 03/30/2022. FINDINGS: The study is intermittently mildly to moderately motion degraded. A coronal T2 sequence was not obtained. Brain: A 6 cm subacute parenchymal hemorrhage in the left temporal lobe is similar in size to the prior studies. There is mild enhancement along the margins of the hematoma, and there is more focal enhancement along the inferolateral margin of the hematoma in the temporal lobe as shown on the prior MRI and corresponding to abnormal vascularity on CTA. Mild-to-moderate surrounding vasogenic edema is similar to the recent CT with similar regional mass effect including left uncal herniation and 8 mm of rightward midline shift. Intraventricular extension is again noted with a moderate amount of residual hemorrhage in the left lateral ventricle, less than on the prior MRI. The atrium and occipital horn of the left lateral ventricle remain distended by hemorrhage, however the ventricles are otherwise nondilated. Old ventriculostomy catheter tracks are noted in the right frontal lobe. There is no evidence of an acute infarct or extra-axial fluid collection. 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  Mild chronic small vessel ischemia is noted in the cerebral white matter. There is mild cerebral atrophy. Vascular: Major intracranial vascular flow voids are preserved. Abnormal vascularity along the lateral aspect of the left temporal hematoma with large draining vein extending to the left transverse-sigmoid sinus junction, more fully evaluated on prior CTA. Skull and upper cervical spine: Right frontal burr hole. No suspicious marrow lesion. Sinuses/Orbits: Unremarkable orbits. Paranasal sinuses and mastoid air cells are clear. Other: None. IMPRESSION: 1. Evolving, large subacute left temporal lobe hemorrhage with unchanged mass effect since the 04/17/2022 CT including 8 mm of rightward midline shift. 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 2. Moderate amount of residual hemorrhage in the left lateral ventricle. 3. No new intracranial abnormality. Electronically Signed   By: ALogan BoresM.D.   On: 04/20/2022 20:02   CT CHEST ABDOMEN PELVIS W CONTRAST  Result Date: 04/19/2022 CLINICAL DATA:  Sepsis, fever of unknown origin EXAM: CT CHEST, ABDOMEN, AND PELVIS WITH CONTRAST TECHNIQUE: Multidetector CT imaging of the chest, abdomen and pelvis was performed following the standard protocol during bolus administration of intravenous contrast. RADIATION DOSE REDUCTION: This exam was performed according to the departmental dose-optimization program which includes automated exposure control, adjustment of the mA and/or kV according to patient size and/or use of iterative reconstruction technique. CONTRAST:  1078mOMNIPAQUE IOHEXOL 300 MG/ML SOLN IV. Dilute oral contrast. COMPARISON:  None Available. FINDINGS: CT CHEST FINDINGS Cardiovascular: Atherosclerotic calcifications  of thoracic aorta, coronary arteries, and proximal great vessels. Aorta normal caliber. Heart size normal. No pericardial effusion. Pulmonary arteries patent on non targeted exam. Mediastinum/Nodes: Esophagus unremarkable. No  thoracic adenopathy. Base of cervical region normal appearance. Subsegmental atelectasis RIGHT lower lobe dependently. Calcified LEFT hilar lymph node. Lungs/Pleura: Subsegmental atelectasis RIGHT lower lobe dependently. Minimal biapical scarring. Lungs otherwise clear. No pulmonary infiltrate, pleural effusion, or pneumothorax. No definite pulmonary mass/nodule. Musculoskeletal: Osseous structures unremarkable. CT ABDOMEN PELVIS FINDINGS Hepatobiliary: Hepatic steatosis. Hepatic cyst lateral segment LEFT lobe 2.3 x 1.9 cm image 56. Multiple additional smaller low-attenuation lesions, likely cysts though these are more difficult to characterize due to the small sizes. Gallbladder unremarkable. No biliary dilatation. Pancreas: Normal appearance Spleen: Normal appearance Adrenals/Urinary Tract: Tiny LEFT adrenal nodule 8 x 6 mm, measuring 38 HU; this is probably benign and no follow-up imaging is recommended. Remainder of LEFT adrenal gland is somewhat thickened without additional discrete nodule. RIGHT adrenal gland normal appearance. Kidneys, ureters, and bladder normal appearance Stomach/Bowel: Normal appendix. Mobile cecum to midline. Stomach and bowel loops otherwise normal appearance. Vascular/Lymphatic: Atherosclerotic calcifications aorta and iliac arteries without aneurysm. No adenopathy. Reproductive: Uterus and ovaries normal appearance Other: No free air or free fluid. Tiny periumbilical hernia containing fat. No definite inflammatory process. Musculoskeletal: Osseous demineralization. IMPRESSION: Scattered subsegmental atelectasis in both lungs. Hepatic steatosis with multiple hepatic cysts. Tiny periumbilical hernia containing fat. No acute abnormalities. Aortic Atherosclerosis (ICD10-I70.0). Electronically Signed   By: Lavonia Dana M.D.   On: 04/19/2022 17:11   Recent Labs    04/18/22 1441 04/19/22 1155  WBC 10.2 10.3  HGB 11.3* 10.9*  HCT 33.0* 32.6*  PLT 363 340   Recent Labs     04/18/22 1441  NA 137  K 3.8  CL 101  CO2 28  GLUCOSE 104*  BUN 15  CREATININE 0.65  CALCIUM 8.8*    Intake/Output Summary (Last 24 hours) at 04/21/2022 1049 Last data filed at 04/21/2022 0813 Gross per 24 hour  Intake 667.02 ml  Output --  Net 667.02 ml        Physical Exam: Vital Signs Blood pressure (!) 116/54, pulse 82, temperature 98.7 F (37.1 C), resp. rate 15, SpO2 99 %.    General: awake, alert, calm NAD HENT: conjugate gaze; oropharynx moist- forehead- sutures in place CV: regular rate; no JVD Pulmonary: CTA B/L; no W/R/R- good air movement GI: soft, NT, ND, (+)BS Psychiatric: tangential/confused vs just aphasic Neurological: aphasic- garbled speech; but very earnest in talking to me;      Comments: Patient is alert and comfortable.  Makes eye contact with examiner.  Difficulty following simple commands with extra time, repetition and cues. Would not cooperate consistently with CN exam. Word salad with occasional appropriate response to simple questions (<10%). Sentence structure improving. Moves all 4 limbs, senses pain. No abnl tone.   Psychiatric:     Comments: Seems generally up beat, engaging.     Assessment/Plan: 1. Functional deficits which require 3+ hours per day of interdisciplinary therapy in a comprehensive inpatient rehab setting. Physiatrist is providing close team supervision and 24 hour management of active medical problems listed below. Physiatrist and rehab team continue to assess barriers to discharge/monitor patient progress toward functional and medical goals  Care Tool:  Bathing    Body parts bathed by patient: Right arm, Left arm, Abdomen, Chest, Front perineal area, Right upper leg, Left upper leg, Right lower leg, Left lower leg, Face   Body parts bathed by helper:  Buttocks     Bathing assist Assist Level: Minimal Assistance - Patient > 75%     Upper Body Dressing/Undressing Upper body dressing        Upper body assist  Assist Level: Contact Guard/Touching assist    Lower Body Dressing/Undressing Lower body dressing      What is the patient wearing?: Underwear/pull up, Pants     Lower body assist Assist for lower body dressing: Minimal Assistance - Patient > 75%     Toileting Toileting    Toileting assist Assist for toileting: Contact Guard/Touching assist     Transfers Chair/bed transfer  Transfers assist  Chair/bed transfer activity did not occur: Safety/medical concerns  Chair/bed transfer assist level: Minimal Assistance - Patient > 75%     Locomotion Ambulation   Ambulation assist      Assist level: Minimal Assistance - Patient > 75% Assistive device: Hand held assist Max distance: 171f   Walk 10 feet activity   Assist     Assist level: Minimal Assistance - Patient > 75% Assistive device: Hand held assist   Walk 50 feet activity   Assist    Assist level: Minimal Assistance - Patient > 75% Assistive device: Hand held assist    Walk 150 feet activity   Assist    Assist level: Minimal Assistance - Patient > 75% Assistive device: Hand held assist    Walk 10 feet on uneven surface  activity   Assist     Assist level: Minimal Assistance - Patient > 75% Assistive device: Walker-rolling   Wheelchair     Assist Is the patient using a wheelchair?: No   Wheelchair activity did not occur: N/A         Wheelchair 50 feet with 2 turns activity    Assist    Wheelchair 50 feet with 2 turns activity did not occur: N/A       Wheelchair 150 feet activity     Assist  Wheelchair 150 feet activity did not occur: N/A       Blood pressure (!) 116/54, pulse 82, temperature 98.7 F (37.1 C), resp. rate 15, SpO2 99 %.  Medical Problem List and Plan: 1. 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   -WILL NEED FORMAL CATHETER ANGIOGRAM BEFORE DISCHARGE FROM CIR  PER DR [email or web address] will discuss with NSGY that it is unlikely that patient's cognition will improve prior to discharge             -patient may shower             -ELOS/Goals: 9 days, supervision goals with PT, OT, mod assist with language  Con't CIR- PT and OT 2.  Antithrombotics: -DVT/anticoagulation:  Pharmaceutical: Heparin             -antiplatelet therapy: N/A 3. Pain Management: NA, d/c oxycodone 4. Insomnia: Melatonin 3 mg nightly.  Decrease Seroquel to 12.'5mg'$  HS             -monitor sleep habits 5. Neuropsych/cognition: This patient is not capable of making decisions on her own behalf. 6. Skin/Wound Care: Routine skin checks. Remove sutures/staples soon.  7. Fluids/Electrolytes/Nutrition: Routine in and outs follow-up chemistries 8.  Hyperlipidemia.  Crestor 9.  COPD with tobacco use.  Continue inhalers as directed.  Provide counts regards to cessation of nicotine products 10.  Hypotension: discontinue Norvasc 2.5 mg daily.  Monitor with increased mobility  9/2- BP controlled- con't regimen 11.  Urinary retention.  Urecholine 10 mg 3 times daily.  Check PVR' and emptying patterns.  12.  Hyperlipidemia.  Crestor 13.  Leukocytosis.   Maintained initially on empiric IV Rocephin and vancomycin.  Transitioned to Unasyn 04/16/2022 x 3 days and stop 14.  Dysphagia.  upgraded to D2 15. Suboptimal vitamin D: continue ergocalciferol 50,000U once per week for 7 weeks 16. Hypoalbuminemia: advised to choose high protein foods 17. Fall: CT head ordered and reviewed; shows improvements in size of bleed 18. Intermittent fevers: will consult ID, likely secondary to the [place] as per IF, will obtain MRI w/ contrast to r/o abscess  9/2- MRI shows no abscess-     LOS: 5 days A FACE TO FACE EVALUATION WAS PERFORMED  Destanie Tibbetts 04/21/2022, 10:49 AM

## 2022-04-21 NOTE — Progress Notes (Signed)
Occupational Therapy Session Note  Patient Details  Name: [name] Giles MRN: [id number] Date of Birth: [birth year]  Today's Date: 04/21/2022 OT Individual Time: 1500-1600 OT Individual Time Calculation (min): 60 min    Short Term Goals: Week 1:  OT Short Term Goal 1 (Week 1): STG = LTG d/t ELOS  Skilled Therapeutic Interventions/Progress Updates:    Upon OT arrival, pt semi recumbent in bed with pt's family present for education. Pt appears agitated and overwhelmed with family present. Pt was agreeable to OT treatment session with encouragement and education. Treatment intervention with a focus on family training, self care retraining,  and direction following/sequencing. Pt's family was educated on current level of function, provided recommendations for bathroom including a shower chair, grab bars, and nonskid mat, assist with medications, educated on benefits of diminished stimuli as pt recovers, and also educated on providing verbal and tactile cuing to assist pt with self care needs while allowing pt to attempt to complete self care tasks to the best of her abilities before assisting. Pt's family verbalized understanding. Pt's family left session secondary to pt's agitation. Pt reports the urge to urinate. Pt completes supine to sit transfer with SBA and performs sit to stand with CGA with RW. Pt performs functional mobility with Min A-CGA with RW and performs toilet transfer, toileting, hand hygiene, UE/LE dressing at the levels below. Pt was transported to tub/shower room via w/c and total A to engage in tub transfer training. Therapist provided verbal and visual demonstration and pt completes with RW and CGA for 2 trials with Mod verbal and tactile cues provided for sequencing. Pt was transported via w/c to dayroom to pick up and place pegs into peg board using B UE. Pt provided 2 step direction and was able to complete with min difficulty. Pt places 30 pegs into peg board and appears fatigued  resting her head on her arm by end of session. Pt was returned to her room via w/c and completes stand step transfer with RW and CGA to bed. Pt doffs shoes with Supervision. Pt returns to supine with SBA and was left in bed with all needs met and safety measures in place. RN present in room.   Therapy Documentation Precautions:  Precautions Precautions: Fall Precaution Comments: expressive > receptive aphasia Restrictions Weight Bearing Restrictions: No    ADL: ADL Grooming: Contact guard Where Assessed-Grooming: Standing at sink Upper Body Dressing: Setup Where Assessed-Upper Body Dressing: Wheelchair Lower Body Dressing: Supervision/safety Where Assessed-Lower Body Dressing: Wheelchair Toileting: Minimal assistance (for balance) Where Assessed-Toileting: Glass blower/designer: Psychiatric nurse Method: Counselling psychologist: Energy manager: Moderate cueing, Minimal assistance Tub/Shower Transfer Method: Ambulating Tub/Shower Equipment: Transfer tub bench    Therapy/Group: Individual Therapy  [name] 04/21/2022, 4:54 PM

## 2022-04-21 NOTE — Progress Notes (Addendum)
Speech Language Pathology Daily Session Note  Patient Details  Name: [name] Giles MRN: [id number] Date of Birth: 11/18/1955  Today's Date: 04/21/2022 SLP Individual Time: 1230-1340 SLP Individual Time Calculation (min): 70 min  Short Term Goals: Week 1: SLP Short Term Goal 1 (Week 1): STGs=LTGs due to ELOS  Skilled Therapeutic Interventions: S: Pt seen this date for skilled ST intervention targeting deglutition and communication goals outlined above. Pt received awake/alert and sitting upright in bed, preparing to eat lunch with NT present for supervision. SLP relieved NT for skilled observation of trial tray of Dysphagia 2 textures and thin liquids via cup. Family present towards the end of today's session for scheduled family education.Agreeable to ST intervention at bedside. Severe fluent aphasia persists.  O: SLP facilitated today's session by providing skilled observation of trial tray of Dysphagia 2 textures and thin liquids. Pt implemented aspiration precautions with Set-Up A and no additional verbal cues for adherence to aspiration precautions. No clinical s/sx concerning for airway invasion noted during meal consumption. Oral phase appeared timely and functional with only minimal oral residuals noted post-swallow, which pt cleared with use of lingual sweep.  Pt consumed ~50% of meal and 177 mL of thin liquid (added to flowsheet). Re: language, pt completed therapeutic receptive ID task of body parts via tablet application with ~27% accuracy given Mod-Max faded to Min-Mod A verbal and visual cues as task progressed. Utilized multimodal communication (written choice prompts, gestures, picture board) to communicate wants + needs and answer functional questions re: family members names and personal facts with Mod-Max A verbal and visual cues (more stimulable for written choice prompts vs use of low-tech picture board). Skilled family education provided re: home exercise program (use of tablet  application for additional practice s/p discharge ([employer]), use of multimodal communication methods + supportive communication interventions to facilitate auditory comprehension and verbal expression, handout on aphasia, and provided with low-tech AAC picture and yes/no board. Anticipate pt will be able to progress diet to at least soft solid textures prior to discharge, and informed family of this anticipation. Upon inquiry, explained basic trajectory of neuro recovery s/p CVA and recommendations for ongoing ST intervention at next venue ([place] vs OP), in addition to need for 24/7 supervision and assistance for safety; they verbalized understanding and appeared receptive and appreciative of education.  A: Pt appears somewhat sitmulable for skilled ST intervention as evident by subtle improvement in accurate selection of biographical information when written in a field of 2 and verbal imitation of personal information, and intermittent verbal imitation of functional words and phrases. Will plan to provide Dysphagia 3 and/or regular textures next session to assess readiness for ongoing advancement to decrease caregiver burden upon discharge. Recommend continuation of Dysphagia 2 textures and thin liquid via cup (no straws) for now. Continues to benefit from full supervision for safety. Will continue to trial low-tech AAC options and provide therapeutic tasks to address simple auditory comprehension and verbal expression.  P: Pt left in direct care of NT for toileting. Family present. Call bell reviewed and within reach and all immediate needs met. Continue per current ST POC next session.   Pain Pointed and verbalized "no pain" when given written binary choice prompts of "pain" and "no pain."  Therapy/Group: Individual Therapy  [name] Giles A [name] Giles 04/21/2022, 1:44 PM

## 2022-04-22 LAB — CULTURE, BLOOD (ROUTINE X 2): Special Requests: ADEQUATE

## 2022-04-22 NOTE — Progress Notes (Signed)
PROGRESS NOTE   Subjective/Complaints:  Pt sleeping- woke briefly;  Per nursing, no issues  ROS:  Limited by aphasia/cognition Objective:   No results found. No results for input(s): "WBC", "HGB", "HCT", "PLT" in the last 72 hours.  No results for input(s): "NA", "K", "CL", "CO2", "GLUCOSE", "BUN", "CREATININE", "CALCIUM" in the last 72 hours.   Intake/Output Summary (Last 24 hours) at 04/22/2022 1605 Last data filed at 04/22/2022 1300 Gross per 24 hour  Intake 499.98 ml  Output --  Net 499.98 ml        Physical Exam: Vital Signs Blood pressure (!) 150/75, pulse 82, temperature 98.6 F (37 C), temperature source Oral, resp. rate 18, SpO2 97 %.     General: awake, alert, appropriate, but woke briefly to stimuli; nurse in room; NAD HENT: conjugate gaze; oropharynx moist CV: regular rate; no JVD Pulmonary: CTA B/L; no W/R/R- good air movement GI: soft, NT, ND, (+)BS Psychiatric: appropriate- sleepy Neurological: sleeping- woke briefly- aphasic      Comments: Patient is alert and comfortable.  Makes eye contact with examiner.  Difficulty following simple commands with extra time, repetition and cues. Would not cooperate consistently with CN exam. Word salad with occasional appropriate response to simple questions (<10%). Sentence structure improving. Moves all 4 limbs, senses pain. No abnl tone.   Psychiatric:     Comments: Seems generally up beat, engaging.     Assessment/Plan: 1. Functional deficits which require 3+ hours per day of interdisciplinary therapy in a comprehensive inpatient rehab setting. Physiatrist is providing close team supervision and 24 hour management of active medical problems listed below. Physiatrist and rehab team continue to assess barriers to discharge/monitor patient progress toward functional and medical goals  Care Tool:  Bathing    Body parts bathed by patient: Right arm, Left  arm, Abdomen, Chest, Front perineal area, Right upper leg, Left upper leg, Right lower leg, Left lower leg, Face   Body parts bathed by helper: Buttocks     Bathing assist Assist Level: Minimal Assistance - Patient > 75%     Upper Body Dressing/Undressing Upper body dressing        Upper body assist Assist Level: Contact Guard/Touching assist    Lower Body Dressing/Undressing Lower body dressing      What is the patient wearing?: Underwear/pull up, Pants     Lower body assist Assist for lower body dressing: Minimal Assistance - Patient > 75%     Toileting Toileting    Toileting assist Assist for toileting: Minimal Assistance - Patient > 75%     Transfers Chair/bed transfer  Transfers assist  Chair/bed transfer activity did not occur: Safety/medical concerns  Chair/bed transfer assist level: Minimal Assistance - Patient > 75%     Locomotion Ambulation   Ambulation assist      Assist level: Minimal Assistance - Patient > 75% Assistive device: Hand held assist Max distance: 169f   Walk 10 feet activity   Assist     Assist level: Minimal Assistance - Patient > 75% Assistive device: Hand held assist   Walk 50 feet activity   Assist    Assist level: Minimal Assistance - Patient > 75% Assistive  device: Hand held assist    Walk 150 feet activity   Assist    Assist level: Minimal Assistance - Patient > 75% Assistive device: Hand held assist    Walk 10 feet on uneven surface  activity   Assist     Assist level: Minimal Assistance - Patient > 75% Assistive device: Walker-rolling   Wheelchair     Assist Is the patient using a wheelchair?: No   Wheelchair activity did not occur: N/A         Wheelchair 50 feet with 2 turns activity    Assist    Wheelchair 50 feet with 2 turns activity did not occur: N/A       Wheelchair 150 feet activity     Assist  Wheelchair 150 feet activity did not occur: N/A       Blood  pressure (!) 150/75, pulse 82, temperature 98.6 F (37 C), temperature source Oral, resp. rate 18, SpO2 97 %.  Medical Problem List and Plan: 1. 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   -WILL NEED FORMAL CATHETER ANGIOGRAM BEFORE DISCHARGE FROM CIR PER DR [id number], will discuss with NSGY that it is unlikely that patient's cognition will improve prior to discharge             -patient may shower             -ELOS/Goals: 9 days, supervision goals with PT, OT, mod assist with language  Con't CIR-  2.  Antithrombotics: -DVT/anticoagulation:  Pharmaceutical: Heparin             -antiplatelet therapy: N/A 3. Pain Management: NA, d/c oxycodone 4. Insomnia: Melatonin 3 mg nightly.  Decrease Seroquel to 12.'5mg'$  HS             -monitor sleep habits  9/3- awake yesterday AM, but sleepy this AM-  5. Neuropsych/cognition: This patient is not capable of making decisions on her own behalf. 6. Skin/Wound Care: Routine skin checks. Remove sutures/staples soon.  7. Fluids/Electrolytes/Nutrition: Routine in and outs follow-up chemistries 8.  Hyperlipidemia.  Crestor 9.  COPD with tobacco use.  Continue inhalers as directed.  Provide counts regards to cessation of nicotine products 10.  Hypotension: discontinue Norvasc 2.5 mg daily.  Monitor with increased mobility  9/2- BP controlled- con't regimen 9/3- BP a little elevated, but overall controlled- con't regimen and monitor trend 11.  Urinary retention.  Urecholine 10 mg 3 times daily.  Check PVR' and emptying patterns.  12.  Hyperlipidemia.  Crestor 13.  Leukocytosis.   Maintained initially on empiric IV Rocephin and vancomycin.  Transitioned to Unasyn 04/16/2022 x 3 days and stop 14.  Dysphagia.  upgraded to D2 15. Suboptimal vitamin D: continue ergocalciferol 50,000U once per week for 7 weeks 16. Hypoalbuminemia: advised to choose high protein foods 17. Fall: CT head  ordered and reviewed; shows improvements in size of bleed 18. Intermittent fevers: will consult ID, likely secondary to the [place] as per IF, will obtain MRI w/ contrast to r/o abscess  9/2- MRI shows no abscess-     LOS: 6 days A FACE TO FACE EVALUATION WAS PERFORMED  [name] Giles 04/22/2022, 4:05 PM

## 2022-04-22 NOTE — Plan of Care (Signed)
Id brief note   Patient with ich and fever Bcx grew sphingomonas species one of 2 sets  Fever had defervesced x 3 days but also on abx   It is still unclear the source of fever being ich or actual bacteremia, leaning toward the former   No intravascular catheter at this time   A/p Ich Fever Gnr bacteremia  Despite unclear significance would like to treat given intracranial hemorrhage and do not want to seed  -finish 7 day antibiotics tx (ceftriaxone would be fine), from 8/31 till 9/06

## 2022-04-23 DIAGNOSIS — I959 Hypotension, unspecified: Secondary | ICD-10-CM

## 2022-04-23 DIAGNOSIS — D72829 Elevated white blood cell count, unspecified: Secondary | ICD-10-CM

## 2022-04-23 LAB — CULTURE, BLOOD (ROUTINE X 2)
Culture: NO GROWTH
Special Requests: ADEQUATE

## 2022-04-23 MED ORDER — SODIUM CHLORIDE 0.9 % IV SOLN
2.0000 g | Freq: Two times a day (BID) | INTRAVENOUS | Status: DC
  Administered 2022-04-23 – 2022-04-24 (×2): 2 g via INTRAVENOUS
  Filled 2022-04-23 (×3): qty 20

## 2022-04-23 NOTE — Progress Notes (Signed)
Speech Language Pathology Daily Session Note  Patient Details  Name: [name] Giles MRN: [id number] Date of Birth: 05/24/1956  Today's Date: 04/23/2022 SLP Individual Time: 1130-1230 SLP Individual Time Calculation (min): 60 min  Short Term Goals: Week 1: SLP Short Term Goal 1 (Week 1): STGs=LTGs due to ELOS  Skilled Therapeutic Interventions: S: Pt seen this date for skilled ST intervention targeting communication and deglutition goals outlined above. Pt received lying supine in bed with eyes closed; aroused easily. No c/o pain. Agreeable to ST intervention at bedside.   O: SLP facilitated today's session by providing overall Mod-Max A verbal and visual cues for participation in therapeutic language tasks on [employer] application. Achieved ~60% accuracy on receptive ID of common objects (field of 2-4) given Mod-Max A, faded to Mod A verbal and visual cues. Achieved ~20% accuracy during confrontational naming given Max to Total A, and copying single CVC and CV words with ~60% accuracy given Mod-Max A, faded to Mod A verbal and visual cues as task progressed. It should be noted that pt exhibited improvement in verbal production of short phrases and some sentences that were coherent and on topic, during today's sessions, in comparison to previous sessions. Re: deglutition, no clinical s/sx concerning for airway invasion were observed with self-administration of thin liquid via cup and Dysphagia 2 textures. Oral phase functional and timely.   A: Pt remains somewhat sitmulable for skilled ST intervention as evident by improvement in verbal output, that is more coherent within functional context, when given model prompts from SLP and improvement in command following when context is known.  Will plan to trial regular diet in upcoming sessions to assess readiness for diet advancement, in addition to continuing language intervention.   P: Pt left in bed with all safety measures activated.  Call bell reviewed and within reach and all immediate needs met. Continue per current ST POC next session.  Pain Pain Assessment Pain Scale: 0-10 Pain Score: 0-No pain  Therapy/Group: Individual Therapy   A  04/23/2022, 12:47 PM 

## 2022-04-23 NOTE — Progress Notes (Signed)
PROGRESS NOTE   Subjective/Complaints:  Pt alert and lying in bed. No concerns elicited.  Seen by ID, Plan for continued . Appears to be eating a little better. ceftriaxone until 9/06.   LBM 9/3  ROS:  Limited by aphasia/cognition  Objective:   No results found. No results for input(s): "WBC", "HGB", "HCT", "PLT" in the last 72 hours.  No results for input(s): "NA", "K", "CL", "CO2", "GLUCOSE", "BUN", "CREATININE", "CALCIUM" in the last 72 hours.   Intake/Output Summary (Last 24 hours) at 04/23/2022 0818 Last data filed at 04/23/2022 0754 Gross per 24 hour  Intake 460 ml  Output --  Net 460 ml         Physical Exam: Vital Signs Blood pressure 135/65, pulse 95, temperature 98.2 F (36.8 C), temperature source Oral, resp. rate 16, SpO2 95 %.     General: awake, alert, NAD HENT: conjugate gaze; oropharynx moist CV: regular rate; no JVD Pulmonary: CTA B/L; no W/R/R- good air movement, normal rate GI: soft, NT, ND, (+)BS Psychiatric: appropriate Neurological: Alert,  aphasic      Comments: Patient is alert and comfortable.  Makes eye contact with examiner.  Difficulty following simple commands with extra time, repetition and cues. Would not cooperate consistently with CN exam. Word salad with occasional appropriate response to simple questions (<10%). Sentence structure improving. Moves all 4 limbs, senses pain. No abnl tone.   Psychiatric:     Comments: Seems generally up beat, engaging.     Assessment/Plan: 1. Functional deficits which require 3+ hours per day of interdisciplinary therapy in a comprehensive inpatient rehab setting. Physiatrist is providing close team supervision and 24 hour management of active medical problems listed below. Physiatrist and rehab team continue to assess barriers to discharge/monitor patient progress toward functional and medical goals  Care Tool:  Bathing    Body parts  bathed by patient: Right arm, Left arm, Abdomen, Chest, Front perineal area, Right upper leg, Left upper leg, Right lower leg, Left lower leg, Face   Body parts bathed by helper: Buttocks     Bathing assist Assist Level: Minimal Assistance - Patient > 75%     Upper Body Dressing/Undressing Upper body dressing        Upper body assist Assist Level: Contact Guard/Touching assist    Lower Body Dressing/Undressing Lower body dressing      What is the patient wearing?: Underwear/pull up, Pants     Lower body assist Assist for lower body dressing: Minimal Assistance - Patient > 75%     Toileting Toileting    Toileting assist Assist for toileting: Minimal Assistance - Patient > 75%     Transfers Chair/bed transfer  Transfers assist  Chair/bed transfer activity did not occur: Safety/medical concerns  Chair/bed transfer assist level: Minimal Assistance - Patient > 75%     Locomotion Ambulation   Ambulation assist      Assist level: Minimal Assistance - Patient > 75% Assistive device: Hand held assist Max distance: 180f   Walk 10 feet activity   Assist     Assist level: Minimal Assistance - Patient > 75% Assistive device: Hand held assist   Walk 50 feet activity  Assist    Assist level: Minimal Assistance - Patient > 75% Assistive device: Hand held assist    Walk 150 feet activity   Assist    Assist level: Minimal Assistance - Patient > 75% Assistive device: Hand held assist    Walk 10 feet on uneven surface  activity   Assist     Assist level: Minimal Assistance - Patient > 75% Assistive device: Walker-rolling   Wheelchair     Assist Is the patient using a wheelchair?: No   Wheelchair activity did not occur: N/A         Wheelchair 50 feet with 2 turns activity    Assist    Wheelchair 50 feet with 2 turns activity did not occur: N/A       Wheelchair 150 feet activity     Assist  Wheelchair 150 feet activity  did not occur: N/A       Blood pressure 135/65, pulse 95, temperature 98.2 F (36.8 C), temperature source Oral, resp. rate 16, SpO2 95 %.  Medical Problem List and Plan: 1. 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   -WILL NEED FORMAL CATHETER ANGIOGRAM BEFORE DISCHARGE FROM CIR PER DR GTXMIWOEH, will discuss with NSGY that it is unlikely that patient's cognition will improve prior to discharge             -patient may shower             -ELOS/Goals: 9 days, supervision goals with PT, OT, mod assist with language  Con't CIR-   -ALF being considered after discharge  2.  Antithrombotics: -DVT/anticoagulation:  Pharmaceutical: Heparin             -antiplatelet therapy: N/A 3. Pain Management: NA, d/c oxycodone 4. Insomnia: Melatonin 3 mg nightly.  Decrease Seroquel to 12.'5mg'$  HS             -monitor sleep habits  9/3- awake yesterday AM, but sleepy this AM-  5. Neuropsych/cognition: This patient is not capable of making decisions on her own behalf. 6. Skin/Wound Care: Routine skin checks. Remove sutures/staples soon.  7. Fluids/Electrolytes/Nutrition: Routine in and outs follow-up chemistries 8.  Hyperlipidemia.  Crestor 9.  COPD with tobacco use.  Continue inhalers as directed.  Provide counts regards to cessation of nicotine products 10.  Hypotension: discontinue Norvasc 2.5 mg daily.  Monitor with increased mobility  9/2- BP controlled- con't regimen 9/3- BP a little elevated, but overall controlled- con't regimen and monitor trend 9/4 BP, few elevated readings yesterday, overall well controlled,  continue to monitor 11.  Urinary retention.  Urecholine 10 mg 3 times daily.  Check PVR' and emptying patterns.   -9/4 Has been continent  12.  Hyperlipidemia.  Crestor 13.  Leukocytosis.   Maintained initially on empiric IV Rocephin and vancomycin.  Transitioned to Unasyn 04/16/2022 x 3 days and stop -Continue  ceftriaxone until 9/6 per ID 14.  Dysphagia.  upgraded to D2 15. Suboptimal vitamin D: continue ergocalciferol 50,000U once per week for 7 weeks 16. Hypoalbuminemia: advised to choose high protein foods 17. Fall: CT head ordered and reviewed; shows improvements in size of bleed 18. Intermittent fevers: will consult ID, likely secondary to the [place] as per IF, will obtain MRI w/ contrast to r/o abscess  9/2- MRI shows no abscess-     LOS: 7 days A FACE TO FACE EVALUATION WAS PERFORMED  Jennye Boroughs 04/23/2022, 8:18 AM

## 2022-04-23 NOTE — Progress Notes (Addendum)
Patient ID: [name] Giles, female   DOB: [birth year], 66 y.o.   MRN: [id number] Spoke with Kathy-sister to ask how the weekend education went.She reports pt was aggitated and yelled at them to leave. She told her 18 yo granddaughter to leave and covered her head with her covers. Informed sister five people here may have been over stimulating to her. She also does not want granddaughter to see her this way. Sister report she feels pt may not be able to go home and may need to go to a ALF or SNF. Discussed her insurance will not cover SNF due to came here. Pt does now have medicare and a supplement sister has signed up for. She is concerned she may react to granddaughter this way at home since son and granddaughter live with her. Son has partial custody of granddaughter. Sister plans to tour some ALF tomorrow and feels she may need to go for one month due to sister is going out of the country in two weeks and will be gone for two weeks. She still is pursuing hired assist at home also. Will work together on best plan for her. Concern is pt leaving ALF and not wanting to stay there.

## 2022-04-23 NOTE — Progress Notes (Signed)
Occupational Therapy Weekly Progress Note  Patient Details  Name: [name] Giles MRN: [id number] Date of Birth: 29-Dec-1955  Beginning of progress report period: April 17, 2022 End of progress report period: April 23, 2022  Today's Date: 04/23/2022 OT Individual Time: 9735-3299 OT Individual Time Calculation (min): 57 min    Patient is showing improved activity tolerance and balance to aide with BADL's.    Patient continues to demonstrate the following deficits: decreased coordination, decreased initiation, decreased attention, decreased awareness, decreased problem solving, decreased safety awareness, and decreased memory, and decreased standing balance and decreased balance strategies and therefore will continue to benefit from skilled OT intervention to enhance overall performance with BADL and iADL.  Patient progressing toward long term goals..  Continue plan of care.  OT Short Term Goals No short term goals set  Skilled Therapeutic Interventions/Progress Updates:    Patient received seated in wheelchair with nursing staff present.  Nursing indicates patient has just used bathroom.  Patient agreeable to bathing and dressing at sink.  Unable to shower this session - IV actively running.  Patient somewhat perseverative with washing her face, however with tactile cueing and at times - hand over hand cueing, patient able to perform all aspects of bathing and dressing.  Patient rested with head and hands frequently throughout this session.  Patient able to walk with rolling walker in room with intermittent contact guard.  Without walker, patient seeks stable surface for contact.   Patient left in bed with bed alarm set and call bell / personal items within reach.    Therapy Documentation Precautions:  Precautions Precautions: Fall Precaution Comments: expressive > receptive aphasia Restrictions Weight Bearing Restrictions: No   Pain: Pain Assessment Pain Scale: 0-10 Pain Score:  0-No pain    Therapy/Group: Individual Therapy  [name] 04/23/2022, 1:05 PM

## 2022-04-23 NOTE — Progress Notes (Signed)
Physical Therapy Session Note  Patient Details  Name: [name] Giles MRN: [id number] Date of Birth: August 09, 1956  Today's Date: 04/23/2022 PT Individual Time: 1300-1411 PT Individual Time Calculation (min): 71 min   Short Term Goals: Week 1:  PT Short Term Goal 1 (Week 1): STG = LTG due to ELOS  Skilled Therapeutic Interventions/Progress Updates:     Pt resting in bed on arrival - awakens easily to voice and is agreeable to PT tx. Does not appear to be in any pain. Continues to present with significant expressive > receptive aphasia, although her 'word salad' has improved and her ability to form sentences seems better with improved ability to express needs/wants.  Supine<>sitting EOB with supervision. Pt reporting she's cold and requests a jacket - provided for her and she was able to don without assist. Don shoes via figure-4 technique without assist while seated EOB.   Sit<>stand to RW with supervision. Ambulates with supervision and RW ~195f to main rehab gym. Cues only required for keeping body within walker frame and upright posture.  Stair training using 6inch steps and 2 hand rails - completed with CGA with reciprocal stepping. Completed x4 steps in total/  Pt reporting being cold and difficulty getting warm - offered her to go outside for sunshine and change in scenery. [facility] CIR [facility] in [facility]) with supervision assist and RW, distance >5087f Pt practiced ambulating on unlevel surfaces outdoors, ~20011fwith supervision assist and RW - pt with adequate safety awareness while navigating unfamiliar environments and with unlevel surfaces.  [facility] supervision assist with her RW and able to navigate spaces without significant difficulty. Pt able to shop around the gift shop but unable to name objects/items due to aphasia.   [facility]ed [facility].  Nustep activity completed at L3 resistance using BUE/BLE, targeting ~40 steps/minute cadence, completed for 10 minutes. Pt reporting fatigue after completion, unrated due to aphasia.  Ambulated back to her room with supervision assist and RW, ~150f37ft directed to toilet for toileting - continent of bladder once on toilet and completes 3/3 toileting tasks without assist.  Returned to bed and completed bed mobility without assist. All needs met, bed alarm on.   Therapy Documentation Precautions:  Precautions Precautions: Fall Precaution Comments: expressive > receptive aphasia Restrictions Weight Bearing Restrictions: No General:     Therapy/Group: Individual Therapy  ChriAlger Simons/2023, 7:47 AM

## 2022-04-24 DIAGNOSIS — I610 Nontraumatic intracerebral hemorrhage in hemisphere, subcortical: Secondary | ICD-10-CM

## 2022-04-24 DIAGNOSIS — R7881 Bacteremia: Secondary | ICD-10-CM

## 2022-04-24 LAB — PROTEIN ELECTROPHORESIS, SERUM
A/G Ratio: 1 (ref 0.7–1.7)
Albumin ELP: 2.7 g/dL — ABNORMAL LOW (ref 2.9–4.4)
Alpha-1-Globulin: 0.3 g/dL (ref 0.0–0.4)
Alpha-2-Globulin: 1 g/dL (ref 0.4–1.0)
Beta Globulin: 0.8 g/dL (ref 0.7–1.3)
Gamma Globulin: 0.5 g/dL (ref 0.4–1.8)
Globulin, Total: 2.6 g/dL (ref 2.2–3.9)
Total Protein ELP: 5.3 g/dL — ABNORMAL LOW (ref 6.0–8.5)

## 2022-04-24 LAB — CRYOGLOBULIN

## 2022-04-24 MED ORDER — SODIUM CHLORIDE 0.9 % IV SOLN
INTRAVENOUS | Status: DC | PRN
  Administered 2022-04-25: 240 mL via INTRAVENOUS
  Administered 2022-04-25: 200 mL via INTRAVENOUS

## 2022-04-24 MED ORDER — CEFAZOLIN SODIUM-DEXTROSE 2-4 GM/100ML-% IV SOLN
2.0000 g | Freq: Three times a day (TID) | INTRAVENOUS | Status: AC
  Administered 2022-04-24 – 2022-04-25 (×5): 2 g via INTRAVENOUS
  Filled 2022-04-24 (×3): qty 100

## 2022-04-24 MED ORDER — BETHANECHOL CHLORIDE 10 MG PO TABS
5.0000 mg | ORAL_TABLET | Freq: Three times a day (TID) | ORAL | Status: DC
  Administered 2022-04-24 – 2022-04-25 (×3): 5 mg via ORAL
  Filled 2022-04-24 (×3): qty 1

## 2022-04-24 NOTE — Progress Notes (Signed)
Occupational Therapy Session Note  Patient Details  Name: [name] Giles MRN: [id number] Date of Birth: 1956-03-01  Today's Date: 04/24/2022 OT Individual Time: 1047-1200 OT Individual Time Calculation (min): 73 min    Short Term Goals: Week 2:   STG = LTG  Skilled Therapeutic Interventions/Progress Updates:    Patient received sitting cross legged in bed - holding head in hands.  Patient frowning yet denies pain. Patient indicated need to use the bathroom, and then agreeable to shower.  Patient having difficulty regulating her body temperature - first cold and shivering, then too warm.  Patient walked in room without walker with min assist.  Patient able to bathe all body parts with cueing for next logical step.  Patient able to dry and dress herself - although lacks initiation of tasks.  Patient with improved activity tolerance - today able to blow dry her hair while seated at sink.  Patient using BUE interchangeably this session.   After bathing and dressing completed - ambulated with walker to dayroom with close supervision.  Patient able to sort by color, by number, and showed good visual memory skills with matching card game - field of 10.   Patient walked back to room, and assisted to bed.  Bed alarm turned on, and call bell and personal items within reach.    Therapy Documentation Precautions:  Precautions Precautions: Fall Precaution Comments: expressive > receptive aphasia Restrictions Weight Bearing Restrictions: No    No report of pain   Therapy/Group: Individual Therapy  [name] 04/24/2022, 12:10 PM

## 2022-04-24 NOTE — Progress Notes (Signed)
PROGRESS NOTE   Subjective/Complaints: Alert and about to receive nebulizer treatment Will touch base with NSGY today regarding their plan for procedure.   ROS:  Limited by aphasia/cognition  Objective:   No results found. No results for input(s): "WBC", "HGB", "HCT", "PLT" in the last 72 hours.  No results for input(s): "NA", "K", "CL", "CO2", "GLUCOSE", "BUN", "CREATININE", "CALCIUM" in the last 72 hours.   Intake/Output Summary (Last 24 hours) at 04/24/2022 1029 Last data filed at 04/24/2022 0805 Gross per 24 hour  Intake 200 ml  Output 200 ml  Net 0 ml        Physical Exam: Vital Signs Blood pressure 125/63, pulse 95, temperature 98.7 F (37.1 C), resp. rate 18, SpO2 93 %.  Gen: no distress, normal appearing HEENT: oral mucosa pink and moist, NCAT Cardio: Reg rate Chest: normal effort, normal rate of breathing Abd: soft, non-distended Ext: no edema Psych: pleasant, normal affect Neurological: Alert,  aphasic      Comments: Patient is alert and comfortable.  Makes eye contact with examiner.  Difficulty following simple commands with extra time, repetition and cues. Would not cooperate consistently with CN exam. Word salad with occasional appropriate response to simple questions (<10%). Sentence structure improving. Moves all 4 limbs, senses pain. No abnl tone.   Psychiatric:     Comments: Seems generally up beat, engaging.     Assessment/Plan: 1. Functional deficits which require 3+ hours per day of interdisciplinary therapy in a comprehensive inpatient rehab setting. Physiatrist is providing close team supervision and 24 hour management of active medical problems listed below. Physiatrist and rehab team continue to assess barriers to discharge/monitor patient progress toward functional and medical goals  Care Tool:  Bathing    Body parts bathed by patient: Right arm, Left arm, Abdomen, Chest, Front  perineal area, Right upper leg, Left upper leg, Right lower leg, Left lower leg, Face, Buttocks   Body parts bathed by helper: Buttocks     Bathing assist Assist Level: Supervision/Verbal cueing     Upper Body Dressing/Undressing Upper body dressing        Upper body assist Assist Level: Supervision/Verbal cueing    Lower Body Dressing/Undressing Lower body dressing      What is the patient wearing?: Underwear/pull up, Pants     Lower body assist Assist for lower body dressing: Supervision/Verbal cueing     Toileting Toileting    Toileting assist Assist for toileting: Minimal Assistance - Patient > 75%     Transfers Chair/bed transfer  Transfers assist  Chair/bed transfer activity did not occur: Safety/medical concerns  Chair/bed transfer assist level: Supervision/Verbal cueing     Locomotion Ambulation   Ambulation assist      Assist level: Minimal Assistance - Patient > 75% Assistive device: Hand held assist Max distance: 141f   Walk 10 feet activity   Assist     Assist level: Minimal Assistance - Patient > 75% Assistive device: Hand held assist   Walk 50 feet activity   Assist    Assist level: Minimal Assistance - Patient > 75% Assistive device: Hand held assist    Walk 150 feet activity   Assist  Assist level: Minimal Assistance - Patient > 75% Assistive device: Hand held assist    Walk 10 feet on uneven surface  activity   Assist     Assist level: Minimal Assistance - Patient > 75% Assistive device: Walker-rolling   Wheelchair     Assist Is the patient using a wheelchair?: No   Wheelchair activity did not occur: N/A         Wheelchair 50 feet with 2 turns activity    Assist    Wheelchair 50 feet with 2 turns activity did not occur: N/A       Wheelchair 150 feet activity     Assist  Wheelchair 150 feet activity did not occur: N/A       Blood pressure 125/63, pulse 95, temperature 98.7 F  (37.1 C), resp. rate 18, SpO2 93 %.  Medical Problem List and Plan: 1. 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   -WILL NEED FORMAL CATHETER ANGIOGRAM BEFORE DISCHARGE FROM CIR PER DR [id number], will discuss with NSGY that it is unlikely that patient's cognition will improve prior to discharge, will touch base again today given that discharge date is in 2 days.              -patient may shower             -ELOS/Goals: 10 days, supervision goals with PT, OT, mod assist with language  Continue CIR  -ALF being considered after discharge  2.  Antithrombotics: -DVT/anticoagulation:  Pharmaceutical: Heparin             -antiplatelet therapy: N/A 3. Pain Management: NA, d/c oxycodone 4. Insomnia: Melatonin 3 mg nightly. Discontinue Seroquel             -monitor sleep habits 5. Neuropsych/cognition: This patient is not capable of making decisions on her own behalf. 6. Skin/Wound Care: Routine skin checks. Remove sutures/staples soon.  7. Fluids/Electrolytes/Nutrition: Routine in and outs follow-up chemistries 8.  Hyperlipidemia.  LDL reviewed and is 119, continue Crestor 9.  COPD with tobacco use.  Continue inhalers as directed.  Provide counts regards to cessation of nicotine products. Incentive spirometer ordered 10.  Hypotension: discontinue Norvasc 2.5 mg daily.  Monitor with increased mobility  9/2- BP controlled- con't regimen 9/3- BP a little elevated, but overall controlled- con't regimen and monitor trend 9/4 BP, few elevated readings yesterday, overall well controlled,  continue to monitor 11.  Urinary retention.  Decrease Urecholine to 5 mg 3 times daily.  Check PVR' and emptying patterns.   Has been continent  12.  Hyperlipidemia.  Crestor 13.  Leukocytosis.   Maintained initially on empiric IV Rocephin and vancomycin.  Transitioned to Unasyn 04/16/2022 x 3 days and stop -Continue ceftriaxone until 9/6  per ID 14.  Dysphagia.  upgraded to D2 15. Suboptimal vitamin D: continue ergocalciferol 50,000U once per week for 7 weeks 16. Hypoalbuminemia: advised to choose high protein foods 17. Fall: CT head ordered and reviewed; shows improvements in size of bleed 18. Intermittent fevers: will consult ID, likely secondary to the [place] as per IF, will obtain MRI w/ contrast to r/o abscess  9/2- MRI shows no abscess-     LOS: 8 days A FACE TO FACE EVALUATION WAS PERFORMED  [name] Giles Allecia Bells 04/24/2022, 10:29 AM

## 2022-04-24 NOTE — Progress Notes (Signed)
Physical Therapy Weekly Progress Note  Patient Details  Name: [name] Giles MRN: [id number] Date of Birth: 05/19/1956  Beginning of progress report period: April 17, 2022 End of progress report period: April 24, 2022  Today's Date: 04/24/2022 PT Individual Time: 1500-1530 PT Individual Time Calculation (min): 30 min   Patient has met 7 of 9 long term goals and is on track meet all of them by DC date with long term goals of supervision. Overall, she completes bed mobility with supervision, bed<>chair transfers with CGA and no AD, and is ambulating >381f with supervision and RW. Her primary deficits are impaired communication/language, expressive > receptive aphasia, motor apraxia, and decreased insight into deficits.   Patient continues to demonstrate the following deficits muscle weakness, decreased cardiorespiratoy endurance, motor apraxia, decreased motor planning, decreased attention, decreased awareness, decreased problem solving, decreased safety awareness, decreased memory, and delayed processing, and decreased standing balance and decreased balance strategies and therefore will continue to benefit from skilled PT intervention to increase functional independence with mobility.  Patient progressing toward long term goals.   Continue plan of care.  PT Short Term Goals Week 1:  PT Short Term Goal 1 (Week 1): STG = LTG due to ELOS Week 2:  PT Short Term Goal 1 (Week 2): STG = LTG  Skilled Therapeutic Interventions/Progress Updates:     Pt presents in bed, agreeable to PT tx. Denies pain. Remains to be aphasic but improved ability to form sentences and express needs/wants.   Supine<>sitting EOB without assist with HOB elevated. Don's jacket and shoes with setupA while seated EOB. Sit<>stand with no AD and CGA. Ambulates with CGA and no AD to bathroom and pt is continent of bowel and bladder - charted. Able to complete 3/3 toileting tasks without assist. Ambulates to sink and she  completes hand hygiene without assist - able to visually scan and locate all items needed without cues. Ambulates with CGA and no AD to day room rehab gym, ~1567f Increased lateral unsteadiness and trunk flexed.   Worked on dynamic standing balance and dynamic gait training. While ambulating, pt instructed to bounce basketball back and forth b/w therapist with PT positioned on her R side and then on her L side during the 2nd trial. Pt required CGA for this activity and minA at times due to LOB laterally, especially with head turns or when she tried to make quick movements. Her attention and visual scanning also improved with repetition. Pt requiring x1 rest break during activity as she ambulated > [id number].   Returned to her room and she was able to remove shoes and jacket without assist or cues. Bed mobility completed with supervision. Bed alarm on, all needs met at end of session.    Therapy Documentation Precautions:  Precautions Precautions: Fall Precaution Comments: expressive > receptive aphasia Restrictions Weight Bearing Restrictions: No General:    Therapy/Group: Individual Therapy  ChrAlger Simons5/2023, 7:31 AM

## 2022-04-24 NOTE — Progress Notes (Signed)
Patient ID: [name] Giles, female   DOB: [birth year], 66 y.o.   MRN: [id number]  Spoke with Kathy-sister who has visited ALF in anticipation of pt needing to go for a month due to [place] going out of the country and son working. Have asked Dan_PA to order TB test. She has medical questions for MD. Have reached out to MD to please call her to address her questions and concerns. Not sure how pt will react when told not going home and going to a ALF. Sister not sure either. Continue to work on best plan for pt at discharge.

## 2022-04-24 NOTE — Progress Notes (Signed)
Speech Language Pathology Daily Session Note  Patient Details  Name: [name] Giles MRN: [id number] Date of Birth: 08/14/1956  Today's Date: 04/24/2022 SLP Individual Time: 0720-0820 SLP Individual Time Calculation (min): 60 min  Short Term Goals: Week 1: SLP Short Term Goal 1 (Week 1): STGs=LTGs due to ELOS  Skilled Therapeutic Interventions:   S: Pt seen this date for skilled ST intervention targeting deglutition of trial tray of regular textures, and communication goals outlined in care plan. Pt received awake/alert and sitting upright in bed. Agreeable to ST intervention at bedside.   O: SLP facilitated today's session by providing: Total A for reading food labels Mod-Max A for auditory comprehension of semi-complex information and two-step commands Min A multimodal cues for following one-step directives within the context of a functional task (AM meal).  Sup A for attention to therapeutic and functional tasks in a mildly distracting environment. Overall Mod A multimodal cues, to include semantic cues, to achieve 80% accuracy when completing therapeutic receptive identification task of nouns (field of 2) on [employer] application.  Total A multimodal cues to achieve 100% accuracy during confrontational naming task.  Set-Up A for AM meal; no further cueing needed. Additionally, no clinical s/sx concerning for airway invasion with intake of soft solid textures and thin liquids via cup.  A: This date, pt appears increasingly sitmulable for skilled ST intervention as evident by continued improvement in verbal output, that is more coherent within functional context, when given model prompts from SLP and improvement in command following and auditory comprehension when context is known. Recommend diet advancement to regular textures and thin liquids via cup (no straws precaution remains in place).   P: Pt left in bed with all safety measures activated. Call bell reviewed and within reach  and all immediate needs met. Continue per current ST POC next session.  Pain Pain Assessment Pain Scale: 0-10 Pain Score: 0-No pain  Therapy/Group: Individual Therapy  Dorsie Burich A Francyne Arreaga 04/24/2022, 8:21 AM

## 2022-04-24 NOTE — Discharge Summary (Signed)
Physician Discharge Summary  Patient ID: [name] Giles MRN: [id number] DOB/AGE: 25-Jul-1956 66 y.o.  Admit date: 04/16/2022 Discharge date: 04/27/2022  Discharge Diagnoses:  Principal Problem:   ICH (intracerebral hemorrhage) ([place]) Active Problems:   Gram-negative bacteremia DVT prophylaxis COPD with tobacco use Hyperlipidemia Hypotension Urinary retention Dysphagia Intermittent fevers/Shingonas bacteremia   Discharged Condition: Stable  Significant Diagnostic Studies: MR BRAIN W WO CONTRAST  Result Date: 04/20/2022 CLINICAL DATA:  Mental status change, unknown cause. Fevers of unknown origin. 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 EXAM: MRI HEAD WITHOUT AND WITH CONTRAST TECHNIQUE: Multiplanar, multiecho pulse sequences of the brain and surrounding structures were obtained without and with intravenous contrast. CONTRAST:  4m GADAVIST GADOBUTROL 1 MMOL/ML IV SOLN COMPARISON:  Head CT 04/17/2022, CTA 03/30/2022, and MRI 03/30/2022. FINDINGS: The study is intermittently mildly to moderately motion degraded. A coronal T2 sequence was not obtained. Brain: A 6 cm subacute parenchymal hemorrhage in the left temporal lobe is similar in size to the prior studies. There is mild enhancement along the margins of the hematoma, and there is more focal enhancement along the inferolateral margin of the hematoma in the temporal lobe as shown on the prior MRI and corresponding to abnormal vascularity on CTA. Mild-to-moderate surrounding vasogenic edema is similar to the recent CT with similar regional mass effect including left uncal herniation and 8 mm of rightward midline shift. Intraventricular extension is again noted with a moderate amount of residual hemorrhage in the left lateral ventricle, less than on the prior MRI. The atrium and occipital horn of the left lateral ventricle remain distended by hemorrhage, however the ventricles are otherwise nondilated. Old  ventriculostomy catheter tracks are noted in the right frontal lobe. There is no evidence of an acute infarct or extra-axial fluid collection. 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 Mild chronic small vessel ischemia is noted in the cerebral white matter. There is mild cerebral atrophy. Vascular: Major intracranial vascular flow voids are preserved. Abnormal vascularity along the lateral aspect of the left temporal hematoma with large draining vein extending to the left transverse-sigmoid sinus junction, more fully evaluated on prior CTA. Skull and upper cervical spine: Right frontal burr hole. No suspicious marrow lesion. Sinuses/Orbits: Unremarkable orbits. Paranasal sinuses and mastoid air cells are clear. Other: None. IMPRESSION: 1. Evolving, large subacute left temporal lobe hemorrhage with unchanged mass effect since the 04/17/2022 CT including 8 mm of rightward midline shift. 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 2. Moderate amount of residual hemorrhage in the left lateral ventricle. 3. No new intracranial abnormality. Electronically Signed   By: ALogan BoresM.D.   On: 04/20/2022 20:02   CT CHEST ABDOMEN PELVIS W CONTRAST  Result Date: 04/19/2022 CLINICAL DATA:  Sepsis, fever of unknown origin EXAM: CT CHEST, ABDOMEN, AND PELVIS WITH CONTRAST TECHNIQUE: Multidetector CT imaging of the chest, abdomen and pelvis was performed following the standard protocol during bolus administration of intravenous contrast. RADIATION DOSE REDUCTION: This exam was performed according to the departmental dose-optimization program which includes automated exposure control, adjustment of the mA and/or kV according to patient size and/or use of iterative reconstruction technique. CONTRAST:  1027mOMNIPAQUE IOHEXOL 300 MG/ML SOLN IV. Dilute oral contrast. COMPARISON:  None Available. FINDINGS: CT CHEST FINDINGS Cardiovascular: Atherosclerotic calcifications of thoracic aorta,  coronary arteries, and proximal great vessels. Aorta normal caliber. Heart size normal. No pericardial effusion. Pulmonary arteries patent on non targeted exam. Mediastinum/Nodes: Esophagus unremarkable. No  thoracic adenopathy. Base of cervical region normal appearance. Subsegmental atelectasis RIGHT lower lobe dependently. Calcified LEFT hilar lymph node. Lungs/Pleura: Subsegmental atelectasis RIGHT lower lobe dependently. Minimal biapical scarring. Lungs otherwise clear. No pulmonary infiltrate, pleural effusion, or pneumothorax. No definite pulmonary mass/nodule. Musculoskeletal: Osseous structures unremarkable. CT ABDOMEN PELVIS FINDINGS Hepatobiliary: Hepatic steatosis. Hepatic cyst lateral segment LEFT lobe 2.3 x 1.9 cm image 56. Multiple additional smaller low-attenuation lesions, likely cysts though these are more difficult to characterize due to the small sizes. Gallbladder unremarkable. No biliary dilatation. Pancreas: Normal appearance Spleen: Normal appearance Adrenals/Urinary Tract: Tiny LEFT adrenal nodule 8 x 6 mm, measuring 38 HU; this is probably benign and no follow-up imaging is recommended. Remainder of LEFT adrenal gland is somewhat thickened without additional discrete nodule. RIGHT adrenal gland normal appearance. Kidneys, ureters, and bladder normal appearance Stomach/Bowel: Normal appendix. Mobile cecum to midline. Stomach and bowel loops otherwise normal appearance. Vascular/Lymphatic: Atherosclerotic calcifications aorta and iliac arteries without aneurysm. No adenopathy. Reproductive: Uterus and ovaries normal appearance Other: No free air or free fluid. Tiny periumbilical hernia containing fat. No definite inflammatory process. Musculoskeletal: Osseous demineralization. IMPRESSION: Scattered subsegmental atelectasis in both lungs. Hepatic steatosis with multiple hepatic cysts. Tiny periumbilical hernia containing fat. No acute abnormalities. Aortic Atherosclerosis (ICD10-I70.0).  Electronically Signed   By: Lavonia Dana M.D.   On: 04/19/2022 17:11   DG Chest 2 View  Result Date: 04/18/2022 CLINICAL DATA:  Recent intracranial hemorrhage.  Confusion.  Fever. EXAM: CHEST - 2 VIEW COMPARISON:  04/12/2022 FINDINGS: Heart size is normal. Aortic atherosclerotic calcification as seen previously. The right lung is clear. There is mild atelectasis at the left lung base. No consolidation or lobar collapse. Calcified hilar lymph nodes on the left as seen previously. No acute bone finding. IMPRESSION: Mild atelectasis at the left lung base.  Otherwise negative. Electronically Signed   By: Nelson Chimes M.D.   On: 04/18/2022 14:18   CT HEAD WO CONTRAST (5MM)  Result Date: 04/17/2022 CLINICAL DATA:  Mental status change, unknown cause EXAM: CT HEAD WITHOUT CONTRAST TECHNIQUE: Contiguous axial images were obtained from the base of the skull through the vertex without intravenous contrast. RADIATION DOSE REDUCTION: This exam was performed according to the departmental dose-optimization program which includes automated exposure control, adjustment of the mA and/or kV according to patient size and/or use of iterative reconstruction technique. COMPARISON:  CT 04/12/2022, CT angio head 03/30/2022, MRI head 03/30/2022. FINDINGS: Brain: No evidence of large-territorial acute infarction. Likely interval decrease in size of an at least 4.4 cm in length left temporal intraparenchymal hemorrhage with associated vasogenic edema. Persistent hyperdense component. Comparison of size of hemorrhagic components difficult given change in technique and scanning. No mass lesion. No extra-axial collection. Stable 8-9 mm left to right midline shift. No findings of uncal or transtentorial herniation. No hydrocephalus. Basilar cisterns are patent. Vascular: No hyperdense vessel. Skull: Likely burr hole of the right skull. Sinuses/Orbits: Paranasal sinuses and mastoid air cells are clear. The orbits are unremarkable. Other:  Skin staples overlie the right frontal scalp. IMPRESSION: Interval decrease in size of an at least 4.4 cm in length left temporal intraparenchymal hemorrhage with associated vasogenic edema. Persistent hyperdense component that may represent acute blood products. Stable 8-9 mm left to right midline shift. Underlying mass lesion not excluded. Known underlying vascular malformation better evaluated on CT angio head 03/30/2022. Electronically Signed   By: Iven Finn M.D.   On: 04/17/2022 16:04   DG Swallowing Func-Speech Pathology  Result Date: 04/12/2022 Table  formatting from the original result was not included. Objective Swallowing Evaluation: Type of Study: MBS-Modified Barium Swallow Study  Patient Details Name: ASHELYN MCCRAVY MRN: [id number] Date of Birth: 1956-02-28 Today's Date: 04/12/2022 Time: SLP Start Time (ACUTE ONLY): 1455 -SLP Stop Time (ACUTE ONLY): 1515 SLP Time Calculation (min) (ACUTE ONLY): 20 min Past Medical History: Past Medical History: Diagnosis Date  Asthma   Medical history non-contributory  Past Surgical History: Past Surgical History: Procedure Laterality Date  COLONOSCOPY    COLONOSCOPY N/A 04/06/2013  Procedure: COLONOSCOPY;  Surgeon: Daneil Dolin, MD;  [facility];  Service: Endoscopy;  Laterality: N/A;  9:30 AM  ENDOMETRIAL ABLATION    IR ANGIO INTRA EXTRACRAN SEL INTERNAL CAROTID BILAT MOD SED  04/04/2022  IR US GUIDE VASC ACCESS RIGHT  04/04/2022 HPI: Pt is a 66 y/o female who presented with headache, N/V, and AMS. CTH revealed a large L frontotemporal ICH with IVH and 21m midline shift. Repeat head CT with increasing hemorrhage and associated hydrocephalus. Pt s/p ventriculostomy 8/10. MBS 8/18 WFL; regular texture diet and thin liquids recommended Repeat CT head 8/24 due to change in mentation: Expected evolution of left temporal lobe hemorrhage. Stable intraventricular hemorrhage, left greater than right, decreased mass effect with decreasing edema around the  hemorrhage and decreasing midline shift. Urinalysis negative, CXR negative.PMH: COPD, tobacco abuse, asthma.  No data recorded  Recommendations for follow up therapy are one component of a multi-disciplinary discharge planning process, led by the attending physician.  Recommendations may be updated based on patient status, additional functional criteria and insurance authorization. Assessment / Plan / Recommendation   04/12/2022   2:55 PM Clinical Impressions Clinical Impression Pt has demonstrated a decline in swallow function since 8/18. Pt exhibited oropharyngeal dysphagia characterized by reduced bolus awareness, impaired mastication, and a pharyngeal delay. She demonstrated intermittent oral holding, and prolonged mastication which necessitated cues for initiation and swallowing. The swallow was often triggered with solid boluses at the level of the valleculae or pyriforms and cues were intermittently necessary for pt to swallow solids. Trace aspiration (PAS 7) was noted once with consecutive swallows of thin liquids via straw secondary to the pharyngeal delay and pt's independent coughing expelled the aspirate. However, aspiration could not be replicated despite provision of additional boluses of thin liquids via straw. Pt consistently spat out the pill when it was given in puree and with thin liquids. Coughing continues to be noted in the absence of aspiration as well so SLP suspects that all coughing at bedside should not be attributed to aspiration especially if pt's mentation is at baseline. Considering pt's now impaired bolus awareness and mastication, a dysphagia 1 diet with thin liquids will be initiated with strict observance of swallowing precautions. SLP will continue to follow pt. SLP Visit Diagnosis Dysphagia, oropharyngeal phase (R13.12) Impact on safety and function Mild aspiration risk     04/12/2022   2:55 PM Treatment Recommendations Treatment Recommendations Therapy as outlined in treatment  plan below     04/12/2022   2:55 PM Prognosis Prognosis for Safe Diet Advancement Good Barriers to Reach Goals Language deficits;Cognitive deficits   04/12/2022   2:55 PM Diet Recommendations SLP Diet Recommendations Thin liquid;Dysphagia 1 (Puree) solids Liquid Administration via Cup;No straw Medication Administration Crushed with puree Compensations Minimize environmental distractions;Slow rate;Small sips/bites;Follow solids with liquid Postural Changes Remain semi-upright after after feeds/meals (Comment);Seated upright at 90 degrees     04/12/2022   2:55 PM Other Recommendations Oral Care Recommendations Oral care BID  Follow Up Recommendations Acute inpatient rehab (3hours/day) Assistance recommended at discharge Frequent or constant Supervision/Assistance Functional Status Assessment Patient has had a recent decline in their functional status and demonstrates the ability to make significant improvements in function in a reasonable and predictable amount of time.   04/12/2022   2:55 PM Frequency and Duration  Speech Therapy Frequency (ACUTE ONLY) min 2x/week Treatment Duration 2 weeks     04/12/2022   2:55 PM Oral Phase Oral Phase Impaired Oral - Nectar Cup Delayed oral transit;Holding of bolus Oral - Nectar Straw Delayed oral transit;Holding of bolus Oral - Thin Cup Delayed oral transit;Holding of bolus Oral - Thin Straw Delayed oral transit;Holding of bolus Oral - Puree Delayed oral transit;Holding of bolus Oral - Regular Delayed oral transit;Holding of bolus;Impaired mastication Oral - Pill Delayed oral transit;Holding of bolus    04/12/2022   2:55 PM Pharyngeal Phase Pharyngeal Phase Impaired Pharyngeal- Nectar Cup Delayed swallow initiation-pyriform sinuses;Delayed swallow initiation-vallecula Pharyngeal- Nectar Straw Delayed swallow initiation-pyriform sinuses;Delayed swallow initiation-vallecula Pharyngeal- Thin Cup Delayed swallow initiation-pyriform sinuses;Delayed swallow initiation-vallecula Pharyngeal-  Thin Straw Delayed swallow initiation-pyriform sinuses;Delayed swallow initiation-vallecula Pharyngeal- Puree Delayed swallow initiation-vallecula Pharyngeal- Regular Delayed swallow initiation-vallecula Pharyngeal- Pill NT    04/12/2022   2:55 PM Cervical Esophageal Phase  Cervical Esophageal Phase [facility] Shanika I. Hardin Negus, [facility], [place] Office number [phone] Horton Marshall 04/12/2022, 3:56 PM                     CT HEAD WO CONTRAST (5MM)  Result Date: 04/12/2022 CLINICAL DATA:  Intraventricular hemorrhage and drain. EXAM: CT HEAD WITHOUT CONTRAST TECHNIQUE: Contiguous axial images were obtained from the base of the skull through the vertex without intravenous contrast. RADIATION DOSE REDUCTION: This exam was performed according to the departmental dose-optimization program which includes automated exposure control, adjustment of the mA and/or kV according to patient size and/or use of iterative reconstruction technique. COMPARISON:  CT head without contrast 04/06/2022 and 03/31/2022. FINDINGS: Brain: The right-sided ventricular catheter scratched at the right-sided ventriculostomy catheter has been removed. Expected evolution of a left temporal lobe hemorrhage noted. The borders are now less distinct. Surrounding edema is slightly less prominent. Mass effect remains with partial effacement of the left lateral ventricle and midline shift. Midline shift now measures 7.5 mm. Stable intraventricular hemorrhage is present, left greater than right. The right frontal ventriculostomy catheter has been removed. Minimal residual edema is noted along the tract. No new hemorrhage or progressive mass effect is present. 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 No acute cortical abnormality is present.  Basal ganglia are intact. The brainstem and cerebellum are within normal limits. Vascular: Atherosclerotic calcifications are present within  the cavernous internal carotid arteries bilaterally. No hyperdense vessel is present. Skull: Right frontal burr hole noted. Calvarium is otherwise intact. No focal lytic or blastic lesions are present. Skin staples are present in the right frontal scalp with a ventriculostomy catheter was. Sinuses/Orbits: The paranasal sinuses and mastoid air cells are clear. The globes and orbits are within normal limits. IMPRESSION: 1. Expected evolution of left temporal lobe hemorrhage. 2. Stable intraventricular hemorrhage, left greater than right. 3. Removal of right frontal ventriculostomy catheter. 4. 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 5. Decreased mass effect with decreasing edema around the hemorrhage and decreasing midline shift. No new hemorrhage. Electronically Signed   By: Wynetta Fines.D.  On: 04/12/2022 14:06   EEG adult  Result Date: 04/12/2022 Lora Havens, MD     04/13/2022  8:18 AM Patient Name: LITISHA GUAGLIARDO MRN: [id number] Epilepsy Attending: Lora Havens Referring Physician/Provider: Rosalin Hawking, MD Date: 04/12/2022 Duration: 23.50 mins Patient history: 66 year old with left temporal hemorrhage and altered mental status.  EEG to evaluate for seizure. Level of alertness: lethargic, asleep AEDs during EEG study: None Technical aspects: This EEG study was done with scalp electrodes positioned according to the 10-20 International system of electrode placement. Electrical activity was reviewed with band pass filter of 1-'70Hz'$ , sensitivity of 7 uV/mm, display speed of 53m/sec with a '60Hz'$  notched filter applied as appropriate. EEG data were recorded continuously and digitally stored.  Video monitoring was available and reviewed as appropriate. Description: During awake state, no clear posterior dominant rhythm was seen.  EEG showed continuous generalized predominantly 5 to 7 Hz theta slowing admixed with intermittent generalized  high amplitude polymorphic sharply contoured 3 to 6 Hz theta-delta slowing.  Sleep was characterized by sleep spindles (12 to 40 Hz), maximal frontocentral region. Hyperventilation and photic stimulation were not performed.   ABNORMALITY - Continuous slow, generalized IMPRESSION: This study is suggestive of moderate diffuse encephalopathy, nonspecific etiology. No seizures or epileptiform discharges were seen throughout the recording. If suspicion for interictal activity remains a concern, a prolonged study can be considered. Priyanka OBarbra Sarks  DG CHEST PORT 1 VIEW  Result Date: 04/12/2022 CLINICAL DATA:  Fever EXAM: PORTABLE CHEST 1 VIEW COMPARISON:  Previous studies including the examination of 04/07/2022 FINDINGS: Cardiac size is within normal limits. Lung fields are clear of any pulmonary edema or new focal infiltrates. Small linear densities in left lower lung field may suggest minimal subsegmental atelectasis. There is interval removal of enteric tube. There is no pleural effusion or pneumothorax. IMPRESSION: There are no signs of pulmonary edema or focal pulmonary consolidation. Electronically Signed   By: PElmer PickerM.D.   On: 04/12/2022 08:21   DG CHEST PORT 1 VIEW  Result Date: 04/07/2022 CLINICAL DATA:  Fever. EXAM: PORTABLE CHEST 1 VIEW COMPARISON:  03/29/2022. FINDINGS: Cardiac silhouette is normal in size. Normal mediastinal and hilar contours. Nasogastric tube passes below the diaphragm. Lungs hyperexpanded, but clear. No pleural effusion or pneumothorax. Skeletal structures are grossly intact. IMPRESSION: No active disease. Electronically Signed   By: DLajean ManesM.D.   On: 04/07/2022 10:18   DG Swallowing Func-Speech Pathology  Result Date: 04/06/2022 Table formatting from the original result was not included. Objective Swallowing Evaluation: Type of Study: MBS-Modified Barium Swallow Study  Patient Details Name: CTOMEKO SCOVILLEMRN: [id number] of Birth: 102-08-1957Today's  Date: 04/06/2022 Time: SLP Start Time (ACUTE ONLY): 1336 -SLP Stop Time (ACUTE ONLY): 1352 SLP Time Calculation (min) (ACUTE ONLY): 16 min Past Medical History: Past Medical History: Diagnosis Date  Asthma   Medical history non-contributory  Past Surgical History: Past Surgical History: Procedure Laterality Date  COLONOSCOPY    COLONOSCOPY N/A 04/06/2013  Procedure: COLONOSCOPY;  Surgeon: RDaneil Dolin MD;  [facility];  Service: Endoscopy;  Laterality: N/A;  9:30 AM  ENDOMETRIAL ABLATION    IR ANGIO INTRA EXTRACRAN SEL INTERNAL CAROTID BILAT MOD SED  04/04/2022  IR [place] VASC ACCESS RIGHT  04/04/2022 HPI: Pt is a 66y/o female who presented with headache, N/V, and AMS. CTH revealed a large L frontotemporal ICH with IVH and 562mmidline shift. Repeat head CT with increasing hemorrhage and associated hydrocephalus.  Pt s/p ventriculostomy 8/10. PMH: COPD, tobacco abuse, asthma.  No data recorded  Recommendations for follow up therapy are one component of a multi-disciplinary discharge planning process, led by the attending physician.  Recommendations may be updated based on patient status, additional functional criteria and insurance authorization. Assessment / Plan / Recommendation   04/06/2022   2:33 PM Clinical Impressions Clinical Impression Pt's oropharyngeal swallow mechanism is within functional limits. Decreased bolus cohesion was noted once when pt filled her oral cavity with 4oz of thin liquids; premature spillage was noted thereafter with transient penetration (PAS 2). However this depth of laryngeal invasion is considered to be WNL and this was not replicated with smaller boluses. Coughing was observed during the study as was noted at bedside; however, no instances of dysfunctional penetration or aspiration were demonstrated at these times and coughing is therefore believed to be unrelated to swallowing. Pt was able to swallow a 27m barium tablet with thin liquids. Additional boluses of thin  liquids were necesssary to facilitate trasport through the cervical and upper thoracic esophagus, but SLP suspects that the presence of the Cortrak impacted this. A regular texture diet with thin liquids is recommended at this time and SLP will follow briefly for swallowing to ensure tolerance. SLP Visit Diagnosis Dysphagia, unspecified (R13.10) Impact on safety and function No limitations     04/06/2022   2:33 PM Treatment Recommendations Treatment Recommendations Therapy as outlined in treatment plan below     04/06/2022   2:33 PM Prognosis Prognosis for Safe Diet Advancement Good Barriers to Reach Goals Language deficits;Cognitive deficits   04/06/2022   2:33 PM Diet Recommendations SLP Diet Recommendations Regular solids;Thin liquid Liquid Administration via Cup;Straw Medication Administration Whole meds with liquid Compensations Minimize environmental distractions;Slow rate Postural Changes Seated upright at 90 degrees     04/06/2022   2:33 PM Other Recommendations Oral Care Recommendations Oral care BID Follow Up Recommendations Acute inpatient rehab (3hours/day) Assistance recommended at discharge Frequent or constant Supervision/Assistance Functional Status Assessment Patient has had a recent decline in their functional status and demonstrates the ability to make significant improvements in function in a reasonable and predictable amount of time.   04/06/2022   2:33 PM Frequency and Duration  Speech Therapy Frequency (ACUTE ONLY) min 2x/week Treatment Duration 2 weeks     04/06/2022   2:33 PM Oral [facility]   04/06/2022   2:33 PM Pharyngeal Phase [facility]   04/06/2022   2:33 PM Cervical Esophageal Phase  Cervical Esophageal Phase [facility] LLCShanika I. PHardin Negus MOakes CChapinOffice number [phone] Marshall8/18/2023, 2:48 PM                     CT HEAD WO CONTRAST (5MM)  Result Date: 04/06/2022 CLINICAL DATA:  66year old female presenting with left  temporal lobe hemorrhage, IVH and EVD. Subsequent encounter. EXAM: CT HEAD WITHOUT CONTRAST TECHNIQUE: Contiguous axial images were obtained from the base of the skull through the vertex without intravenous contrast. RADIATION DOSE REDUCTION: This exam was performed according to the departmental dose-optimization program which includes automated exposure control, adjustment of the mA and/or kV according to patient size and/or use of iterative reconstruction technique. COMPARISON:  03/31/2022 head CT and earlier. FINDINGS: Brain: Ongoing hyperdense intra-axial hemorrhage in the left hemisphere throughout the temporal lobe. Hematoma size now 66 mm x 47 mm (including mesial temporal lobe blood) by 51 mm (AP by transverse by CC) estimated blood  products now up to 79 mL, mildly progressed from prior exams. Ongoing regional edema, with perhaps greater extension into the posterior deep white matter capsules now on series 3, image 16. Rightward midline shift of 10 mm is 1 mm greater since 03/31/2022. Basilar cisterns remain patent. Stable right frontal approach EVD which terminates at the septum pellucidum and does communicate with the right lateral ventricle on coronal image 29. Moderate volume intraventricular hemorrhage mostly in the left lateral ventricle now. Clearance of blood from the 3rd and 4th ventricles. No other extra-axial extension of blood. No cortically based acute infarct identified. Vascular: Calcified atherosclerosis at the skull base. Skull: Stable right frontal burr hole. No acute osseous abnormality identified. Sinuses/Orbits: Left nasoenteric tube remains in place. Stable sinus and mastoid aeration. Other: Minimal postsurgical changes to the superior right scalp. Mild rightward gaze but otherwise orbits appear negative. IMPRESSION: 1. Large left temporal lobe intra-axial hematoma has not regressed since 03/31/2022 and now appears slightly larger. Regional edema and rightward midline shift not  significantly changed. Basilar cisterns remain patent. 2. Stable right frontal EVD and moderate volume IVH in the left lateral ventricle. Blood has cleared the 3rd and 4th ventricles since 03/31/2022. 3. No new intracranial abnormality. Electronically Signed   By: Genevie Ann M.D.   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 She therefore presents for diagnostic cerebral angiogram. ACCESS: The technical aspects of the procedure as well as its potential risks and benefits were reviewed with the patient's family. These risks included but were not limited bleeding, infection, allergic reaction, damage to organs or vital structures, stroke, non-diagnostic procedure, and the catastrophic outcomes of heart attack, coma, and death. With an understanding of these risks, informed consent was obtained and witnessed. The patient was placed in the supine position on the angiography table and the skin of right groin prepped in the usual sterile fashion. Multiple attempts were made to access the right common femoral artery. Ultimately, ultrasound guidance was used to introduce the micro puncture needle into the more proximal right common femoral artery and advanced the cope Mandril wire, however fluoroscopic image revealed puncture site likely proximal to the right inguinal ligament. Patient was also noted to be moving and not able to follow simple verbal commands. Because of her motion, I felt it highly unlikely that we would be able to obtain meaningful angiographic images. I was also concerned about hematoma if we did place a sheath in the femoral artery. I therefore elected to abort the procedure at this time. The micro puncture needle was therefore removed  and hemostasis was secured with manual compression. MEDICATIONS: HEPARIN: 0 Units total. CONTRAST:  cc, Omnipaque 300 FLUOROSCOPY TIME:  FLUOROSCOPY TIME: See IR records TECHNIQUE: CATHETERS AND WIRES 5-French JB-1 catheter 0.035" glidewire FINDINGS: None (procedure aborted) DISPOSITION: The patient was returned to the neuro intensive care unit for further care. IMPRESSION: 1. Aborted attempt at diagnostic cerebral angiogram due primarily to significant patient motion. Electronically Signed   By: [name]   On: 04/05/2022 18:08   IR US Guide Vasc Access Right  Result Date: 04/05/2022 PROCEDURE: ABORTED DIAGNOSTIC CEREBRAL ANGIOGRAM HISTORY: The patient is a 66 year old woman admitted to the hospital with left temporal hematoma and CT angiogram suggesting the likelihood of a  dural fistula. She therefore presents for diagnostic cerebral angiogram. ACCESS: The technical aspects of the procedure as well as its potential risks and benefits were reviewed with the patient's family. These risks included but were not limited bleeding, infection, allergic reaction, damage to organs or vital structures, stroke, non-diagnostic procedure, and the catastrophic outcomes of heart attack, coma, and death. With an understanding of these risks, informed consent was obtained and witnessed. The patient was placed in the supine position on the angiography table and the skin of right groin prepped in the usual sterile fashion. Multiple attempts were made to access the right common femoral artery. Ultimately, ultrasound guidance was used to introduce the micro puncture needle into the more proximal right common femoral artery and advanced the cope Mandril wire, however fluoroscopic image revealed puncture site likely proximal to the right inguinal ligament. Patient was also noted to be moving and not able to follow simple verbal commands. Because of her motion, I felt it highly unlikely that we would be able to obtain  meaningful angiographic images. I was also concerned about hematoma if we did place a sheath in the femoral artery. I therefore elected to abort the procedure at this time. The micro puncture needle was therefore removed and hemostasis was secured with manual compression. MEDICATIONS: HEPARIN: 0 Units total. CONTRAST:  cc, Omnipaque 300 FLUOROSCOPY TIME:  FLUOROSCOPY TIME: See IR records TECHNIQUE: CATHETERS AND WIRES 5-French JB-1 catheter 0.035" glidewire FINDINGS: None (procedure aborted) DISPOSITION: The patient was returned to the neuro intensive care unit for further care. IMPRESSION: 1. Aborted attempt at diagnostic cerebral angiogram due primarily to significant patient motion. Electronically Signed   By: [name]   On: 04/05/2022 18:08   DG Abd Portable 1V  Result Date: 04/04/2022 CLINICAL DATA:  Nasogastric tube placement EXAM: PORTABLE ABDOMEN - 1 VIEW COMPARISON:  March 30, 2022 FINDINGS: The bowel gas pattern is normal. No radio-opaque calculi or other significant radiographic abnormality are seen. The distal tip of the nasogastric tube projects over the stomach. Partially visualized central venous catheter, terminating near the superior cavoatrial junction. The visualized bibasilar lungs are clear. IMPRESSION: The distal tip of the nasogastric tube is in the stomach. Electronically Signed   By: Beryle Flock M.D.   On: 04/04/2022 10:21   CT HEAD WO CONTRAST (5MM)  Result Date: 03/31/2022 CLINICAL DATA:  66 year old female presenting with left temporal lobe hemorrhage, IVH and EVD. 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 Subsequent encounter. EXAM: CT HEAD WITHOUT CONTRAST TECHNIQUE: Contiguous axial images were obtained from the base of the skull through the vertex without intravenous contrast. RADIATION DOSE REDUCTION: This exam was performed according to the departmental dose-optimization program which includes automated exposure control, adjustment of the mA and/or  kV according to patient size and/or use of iterative reconstruction technique. COMPARISON:  CTA head and neck, brain MRI 03/30/2022 and earlier. FINDINGS: Brain: Large intra-axial hemorrhage in the left temporal lobe (68 mL volume estimation) with surrounding edema has not significantly changed in size or configuration from yesterday. Moderate volume intraventricular hemorrhage and mild to moderate enlargement of the left lateral ventricle appears stable. Stable right frontal approach external ventricular drain terminating near the septum pellucidum. Stable ventricle size and configuration. Stable rightward midline shift of 9 mm at the anterior septum pellucidum. Basilar cisterns remain patent. No other extra-axial extension of blood identified. Stable gray-white matter differentiation throughout the brain. Vascular: Calcified atherosclerosis at the skull base. Skull: Stable right frontal burr hole. No acute osseous abnormality identified.  Sinuses/Orbits: Visualized paranasal sinuses and mastoids are stable and well aerated. Other: Left nasoenteric tube now in place. Right frontal approach EVD remains. No acute orbit or scalp soft tissue finding. IMPRESSION: 1. Stable large left temporal lobe hemorrhage and edema since yesterday. Stable intraventricular hemorrhage and EVD along with mild to moderate enlargement of the left temporal lobe. 2. Stable intracranial mass effect with up to 9 mm rightward midline shift. Basilar cisterns remain patent. 3. No new intracranial abnormality. Electronically Signed   By: Genevie Ann M.D.   On: 03/31/2022 09:40   [place] EKG SITE RITE  Result Date: 03/30/2022 If Site Rite image not attached, placement could not be confirmed due to current cardiac rhythm.  DG Abd Portable 1V  Result Date: 03/30/2022 CLINICAL DATA:  Nasogastric tube placement. EXAM: PORTABLE ABDOMEN - 1 VIEW COMPARISON:  None Available. FINDINGS: The bowel gas pattern is normal. Distal tip of feeding tube is seen in  expected position of distal stomach. No radio-opaque calculi or other significant radiographic abnormality are seen. IMPRESSION: Distal tip of feeding tube seen in expected position of distal stomach. Electronically Signed   By: Marijo Conception M.D.   On: 03/30/2022 12:20   [place] EKG SITE RITE  Result Date: 03/30/2022 If Site Rite image not attached, placement could not be confirmed due to current cardiac rhythm.  ECHOCARDIOGRAM COMPLETE  Result Date: 03/30/2022    ECHOCARDIOGRAM REPORT   Patient Name:   JAHNI PAUL Date of Exam: 03/30/2022 Medical Rec #:  [id number]        Height:       65.0 in Accession #:    [id number]       Weight:       145.9 lb Date of Birth:  1955/10/17        BSA:          1.730 m Patient Age:    13 years         BP:           150/66 mmHg Patient Gender: F                HR:           62 bpm. Exam Location:  Inpatient Procedure: 2D Echo, Cardiac Doppler and Color Doppler Indications:    Stroke  History:        Patient has no prior history of Echocardiogram examinations.  Sonographer:    Merrie Roof RDCS Referring Phys: [id number] ASHISH ARORA IMPRESSIONS  1. Left ventricular ejection fraction, by estimation, is 60 to 65%. The left ventricle has normal function. The left ventricle has no regional wall motion abnormalities. Left ventricular diastolic parameters are indeterminate. Elevated left ventricular end-diastolic pressure.  2. Right ventricular systolic function is normal. The right ventricular size is normal. Tricuspid regurgitation signal is inadequate for assessing PA pressure.  3. The mitral valve is degenerative. Mild mitral valve regurgitation. No evidence of mitral stenosis. Moderate mitral annular calcification.  4. The aortic valve is normal in structure. Aortic valve regurgitation is not visualized. No aortic stenosis is present.  5. The inferior vena cava is dilated in size with >50% respiratory variability, suggesting right atrial pressure of 8 mmHg.  Conclusion(s)/Recommendation(s): No intracardiac source of embolism detected on this transthoracic study. Consider a transesophageal echocardiogram to exclude cardiac source of embolism if clinically indicated. FINDINGS  Left Ventricle: Left ventricular ejection fraction, by estimation, is 60 to 65%. The left ventricle has normal function. The left ventricle has  no regional wall motion abnormalities. The left ventricular internal cavity size was normal in size. There is  no left ventricular hypertrophy. Left ventricular diastolic parameters are indeterminate. Elevated left ventricular end-diastolic pressure. Right Ventricle: The right ventricular size is normal. No increase in right ventricular wall thickness. Right ventricular systolic function is normal. Tricuspid regurgitation signal is inadequate for assessing PA pressure. Left Atrium: Left atrial size was normal in size. Right Atrium: Right atrial size was normal in size. Pericardium: There is no evidence of pericardial effusion. Mitral Valve: The mitral valve is degenerative in appearance. There is moderate thickening of the mitral valve leaflet(s). There is mild calcification of the mitral valve leaflet(s). Moderate mitral annular calcification. Mild mitral valve regurgitation.  No evidence of mitral valve stenosis. Tricuspid Valve: The tricuspid valve is normal in structure. Tricuspid valve regurgitation is not demonstrated. No evidence of tricuspid stenosis. Aortic Valve: The aortic valve is normal in structure. Aortic valve regurgitation is not visualized. No aortic stenosis is present. Pulmonic Valve: The pulmonic valve was normal in structure. Pulmonic valve regurgitation is not visualized. No evidence of pulmonic stenosis. Aorta: The aortic root is normal in size and structure. Venous: The inferior vena cava is dilated in size with greater than 50% respiratory variability, suggesting right atrial pressure of 8 mmHg. IAS/Shunts: No atrial level shunt  detected by color flow Doppler.  LEFT VENTRICLE PLAX 2D LVIDd:         3.90 cm   Diastology LVIDs:         3.00 cm   LV e' medial:    6.42 cm/s LV PW:         1.00 cm   LV E/e' medial:  17.4 LV IVS:        0.90 cm   LV e' lateral:   6.64 cm/s LVOT diam:     1.80 cm   LV E/e' lateral: 16.9 LV SV:         47 LV SV Index:   27 LVOT Area:     2.54 cm  RIGHT VENTRICLE          IVC RV Basal diam:  2.50 cm  IVC diam: 2.20 cm LEFT ATRIUM             Index        RIGHT ATRIUM           Index LA diam:        2.90 cm 1.68 cm/m   RA Area:     11.30 cm LA Vol (A2C):   46.9 ml 27.11 ml/m  RA Volume:   29.00 ml  16.76 ml/m LA Vol (A4C):   32.4 ml 18.73 ml/m LA Biplane Vol: 43.4 ml 25.08 ml/m  AORTIC VALVE LVOT Vmax:   80.90 cm/s LVOT Vmean:  52.100 cm/s LVOT VTI:    0.185 m MITRAL VALVE MV Area (PHT): 3.77 cm     SHUNTS MV Decel Time: 201 msec     Systemic VTI:  0.18 m MV E velocity: 112.00 cm/s  Systemic Diam: 1.80 cm MV A velocity: 111.00 cm/s MV E/A ratio:  1.01 Fransico Him MD Electronically signed by Fransico Him MD Signature Date/Time: 03/30/2022/10:27:00 AM    Final    CT ANGIO HEAD NECK W WO CM  Result Date: 03/30/2022 CLINICAL DATA:  Stroke, hemorrhagic. EXAM: CT VENOGRAM HEAD: CT ANGIOGRAPHY HEAD AND NECK TECHNIQUE: Multidetector CT imaging of the head and neck was performed using the standard protocol during bolus administration of  intravenous contrast. Multiplanar CT image reconstructions and MIPs were obtained to evaluate the vascular anatomy. Carotid stenosis measurements (when applicable) are obtained utilizing NASCET criteria, using the distal internal carotid diameter as the denominator. Venographic phase images of the brain were obtained following the administration of intravenous contrast. Multiplanar reformats and maximum intensity projections were generated. RADIATION DOSE REDUCTION: This exam was performed according to the departmental dose-optimization program which includes automated exposure  control, adjustment of the mA and/or kV according to patient size and/or use of iterative reconstruction technique. CONTRAST:  77m OMNIPAQUE IOHEXOL 350 MG/ML SOLN COMPARISON:  Brain MRI 03/30/2022.  Head CT 03/29/2022. FINDINGS: CT HEAD FINDINGS Brain: Cerebral volume is normal for age. Unchanged position of a right frontal approach ventricular catheter, which enters the body of the right lateral ventricle and terminates near midline. Punctate locule of pneumocephalus within the right lateral ventricle frontal horn. A large acute parenchymal hemorrhage centered within the left temporal lobe has not significantly changed in size from the MRI performed earlier today, again measuring 5.9 x 4.6 cm in transaxial dimensions. Stable surrounding edema. Unchanged mass effect with 10 mm rightward midline shift measured at the level of the septum pellucidum. The basal cisterns remain patent. Unchanged large-volume intraventricular extension of hemorrhage, greatest within the left lateral, third and fourth ventricles. Unchanged dilation of the left lateral ventricle. The right lateral ventricle remains decompressed. Small chronic cerebellar infarcts, better appreciated on the prior MRI. Vascular: No hyperdense vessel. Atherosclerotic calcifications. Skull: No fracture or aggressive osseous lesion. Sinuses/Orbits: No orbital mass or acute orbital finding. Trace mucosal thickening scattered within the bilateral ethmoid sinuses. Review of the MIP images confirms the above findings CTA NECK FINDINGS Aortic arch: Standard aortic branching. Atherosclerotic plaque within the visualized aortic arch and proximal major branch vessels of the neck. Streak and beam hardening artifact arising from a dense right-sided contrast bolus partially obscures the right subclavian artery. Within this limitation, there is no appreciable hemodynamically significant innominate or proximal subclavian artery stenosis. Right carotid system: CCA and ICA  patent within the neck without hemodynamically significant stenosis (50% or greater). Mild atherosclerotic plaque about the carotid bifurcation and within the proximal ICA. Left carotid system: CCA and ICA patent within the neck without hemodynamically significant stenosis (50% or greater). Atherosclerotic plaque. Most notably, there is mild-to-moderate calcified plaque about the carotid bifurcation and within the proximal ICA. Vertebral arteries: Codominant and patent within the neck without stenosis or significant atherosclerotic disease. Skeleton: Levocurvature of the cervical spine. Partially imaged dextrocurvature of the thoracic spine. Cervical spondylosis. No acute fracture or aggressive osseous lesion. Other neck: No neck mass or cervical lymphadenopathy. Upper chest: No consolidation within the imaged lung apices. Centrilobular and paraseptal emphysema. Biapical pleuroparenchymal scarring Review of the MIP images confirms the above findings CTA HEAD FINDINGS Anterior circulation: The intracranial internal carotid arteries are patent. Atherosclerotic plaque within both vessels with no more than mild stenosis The M1 middle cerebral arteries are patent. No M2 proximal branch occlusion is identified. Atherosclerotic irregularity of the M2 and more distal MCA vessels without high-grade proximal stenosis identified. The anterior cerebral arteries are patent. Developmentally diminutive left A1 segment. Cluster of tortuous abnormal vessels along the lateral aspect of the left temporal lobe compatible with an AVF for AVM. There is a large draining vein extending from this site posteriorly to the junction of the left transverse and sigmoid dural venous sinuses. A smaller draining vein extends anteriorly within the left middle cranial fossa. Posterior circulation: The intracranial vertebral arteries  are patent. The basilar artery is patent. The posterior cerebral arteries are patent. Hypoplastic right P1 segment with  sizable right posterior communicating artery. The left posterior communicating artery is diminutive or absent. Venous sinuses: Within the limitations of contrast timing, no convincing thrombus. Anatomic variants: As described. Review of the MIP images confirms the above findings CT VENOGRAM HEAD FINDINGS The superior sagittal sinus, internal cerebral veins, vein of Galen, straight sinus, transverse sinuses, sigmoid sinuses and visualized jugular veins are patent. There is no appreciable intracranial venous thrombosis. Cluster of abnormal, tortuous vessels along the lateral aspect of the left temporal lobe as described above. Large draining vein extending from this site posteriorly to the junction of the left transverse and sigmoid dural venous sinuses. A smaller draining vein extends anteriorly within the left middle cranial fossa. CTA head impression #1 will be called to the ordering clinician or representative by the Radiologist Assistant, and communication documented in the PACS or [employer]. IMPRESSION: Non-contrast head CT: 1. Unchanged large acute parenchymal hemorrhage centered within the left temporal lobe with surrounding edema. Unchanged mass effect with 10 mm rightward midline shift. 2. Unchanged large volume intraventricular extension of hemorrhage. 3. Stable position of a right frontal approach ventricular catheter, which enters the body of the right lateral ventricle and terminates near midline. The right lateral ventricle remains decompressed. Unchanged dilation of the left lateral ventricle. a CTA neck: 1. The common carotid, internal carotid and vertebral arteries are patent within the neck without hemodynamically significant stenosis. Atherosclerotic plaque within bilateral carotid systems within the neck, as described. 2. Aortic Atherosclerosis (ICD10-I70.0) and Emphysema (ICD10-J43.9). CTA head: 1. Cluster of abnormal, tortuous vessels along the lateral aspect of the left temporal lobe  compatible with an AVF for AVM. There is a large draining vein extending from this site posteriorly to the junction of the left transverse and sigmoid dural venous sinuses. A smaller draining vein extends anteriorly within the left middle cranial fossa. Catheter-based angiography should be considered for further evaluation. 2. Intracranial atherosclerotic disease without large vessel occlusion or proximal high-grade arterial stenosis. CT venogram head: 1. 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 2. No evidence of dural venous sinus thrombosis. Electronically Signed   By: Kellie Simmering D.O.   On: 03/30/2022 10:21   CT VENOGRAM HEAD  Result Date: 03/30/2022 CLINICAL DATA:  Stroke, hemorrhagic. EXAM: CT VENOGRAM HEAD: CT ANGIOGRAPHY HEAD AND NECK TECHNIQUE: Multidetector CT imaging of the head and neck was performed using the standard protocol during bolus administration of intravenous contrast. Multiplanar CT image reconstructions and MIPs were obtained to evaluate the vascular anatomy. Carotid stenosis measurements (when applicable) are obtained utilizing NASCET criteria, using the distal internal carotid diameter as the denominator. Venographic phase images of the brain were obtained following the administration of intravenous contrast. Multiplanar reformats and maximum intensity projections were generated. RADIATION DOSE REDUCTION: This exam was performed according to the departmental dose-optimization program which includes automated exposure control, adjustment of the mA and/or kV according to patient size and/or use of iterative reconstruction technique. CONTRAST:  76m OMNIPAQUE IOHEXOL 350 MG/ML SOLN COMPARISON:  Brain MRI 03/30/2022.  Head CT 03/29/2022. FINDINGS: CT HEAD FINDINGS Brain: Cerebral volume is normal for age. Unchanged position of a right frontal approach ventricular catheter, which enters the body of the right lateral ventricle and terminates near  midline. Punctate locule of pneumocephalus within the right lateral ventricle frontal horn. A large acute parenchymal hemorrhage centered within the left  temporal lobe has not significantly changed in size from the MRI performed earlier today, again measuring 5.9 x 4.6 cm in transaxial dimensions. Stable surrounding edema. Unchanged mass effect with 10 mm rightward midline shift measured at the level of the septum pellucidum. The basal cisterns remain patent. Unchanged large-volume intraventricular extension of hemorrhage, greatest within the left lateral, third and fourth ventricles. Unchanged dilation of the left lateral ventricle. The right lateral ventricle remains decompressed. Small chronic cerebellar infarcts, better appreciated on the prior MRI. Vascular: No hyperdense vessel. Atherosclerotic calcifications. Skull: No fracture or aggressive osseous lesion. Sinuses/Orbits: No orbital mass or acute orbital finding. Trace mucosal thickening scattered within the bilateral ethmoid sinuses. Review of the MIP images confirms the above findings CTA NECK FINDINGS Aortic arch: Standard aortic branching. Atherosclerotic plaque within the visualized aortic arch and proximal major branch vessels of the neck. Streak and beam hardening artifact arising from a dense right-sided contrast bolus partially obscures the right subclavian artery. Within this limitation, there is no appreciable hemodynamically significant innominate or proximal subclavian artery stenosis. Right carotid system: CCA and ICA patent within the neck without hemodynamically significant stenosis (50% or greater). Mild atherosclerotic plaque about the carotid bifurcation and within the proximal ICA. Left carotid system: CCA and ICA patent within the neck without hemodynamically significant stenosis (50% or greater). Atherosclerotic plaque. Most notably, there is mild-to-moderate calcified plaque about the carotid bifurcation and within the proximal ICA.  Vertebral arteries: Codominant and patent within the neck without stenosis or significant atherosclerotic disease. Skeleton: Levocurvature of the cervical spine. Partially imaged dextrocurvature of the thoracic spine. Cervical spondylosis. No acute fracture or aggressive osseous lesion. Other neck: No neck mass or cervical lymphadenopathy. Upper chest: No consolidation within the imaged lung apices. Centrilobular and paraseptal emphysema. Biapical pleuroparenchymal scarring Review of the MIP images confirms the above findings CTA HEAD FINDINGS Anterior circulation: The intracranial internal carotid arteries are patent. Atherosclerotic plaque within both vessels with no more than mild stenosis The M1 middle cerebral arteries are patent. No M2 proximal branch occlusion is identified. Atherosclerotic irregularity of the M2 and more distal MCA vessels without high-grade proximal stenosis identified. The anterior cerebral arteries are patent. Developmentally diminutive left A1 segment. Cluster of tortuous abnormal vessels along the lateral aspect of the left temporal lobe compatible with an AVF for AVM. There is a large draining vein extending from this site posteriorly to the junction of the left transverse and sigmoid dural venous sinuses. A smaller draining vein extends anteriorly within the left middle cranial fossa. Posterior circulation: The intracranial vertebral arteries are patent. The basilar artery is patent. The posterior cerebral arteries are patent. Hypoplastic right P1 segment with sizable right posterior communicating artery. The left posterior communicating artery is diminutive or absent. Venous sinuses: Within the limitations of contrast timing, no convincing thrombus. Anatomic variants: As described. Review of the MIP images confirms the above findings CT VENOGRAM HEAD FINDINGS The superior sagittal sinus, internal cerebral veins, vein of Galen, straight sinus, transverse sinuses, sigmoid sinuses and  visualized jugular veins are patent. There is no appreciable intracranial venous thrombosis. Cluster of abnormal, tortuous vessels along the lateral aspect of the left temporal lobe as described above. Large draining vein extending from this site posteriorly to the junction of the left transverse and sigmoid dural venous sinuses. A smaller draining vein extends anteriorly within the left middle cranial fossa. CTA head impression #1 will be called to the ordering clinician or representative by the Radiologist Assistant, and communication documented in the  PACS or [employer]. IMPRESSION: Non-contrast head CT: 1. Unchanged large acute parenchymal hemorrhage centered within the left temporal lobe with surrounding edema. Unchanged mass effect with 10 mm rightward midline shift. 2. Unchanged large volume intraventricular extension of hemorrhage. 3. Stable position of a right frontal approach ventricular catheter, which enters the body of the right lateral ventricle and terminates near midline. The right lateral ventricle remains decompressed. Unchanged dilation of the left lateral ventricle. a CTA neck: 1. The common carotid, internal carotid and vertebral arteries are patent within the neck without hemodynamically significant stenosis. Atherosclerotic plaque within bilateral carotid systems within the neck, as described. 2. Aortic Atherosclerosis (ICD10-I70.0) and Emphysema (ICD10-J43.9). CTA head: 1. Cluster of abnormal, tortuous vessels along the lateral aspect of the left temporal lobe compatible with an AVF for AVM. There is a large draining vein extending from this site posteriorly to the junction of the left transverse and sigmoid dural venous sinuses. A smaller draining vein extends anteriorly within the left middle cranial fossa. Catheter-based angiography should be considered for further evaluation. 2. Intracranial atherosclerotic disease without large vessel occlusion or proximal high-grade arterial  stenosis. CT venogram head: 1. 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 2. No evidence of dural venous sinus thrombosis. Electronically Signed   By: Kellie Simmering D.O.   On: 03/30/2022 10:21   MR BRAIN W WO CONTRAST  Result Date: 03/30/2022 CLINICAL DATA:  Initial evaluation for neuro deficit, stroke suspected, intracranial hemorrhage. EXAM: MRI HEAD WITHOUT AND WITH CONTRAST TECHNIQUE: Multiplanar, multiecho pulse sequences of the brain and surrounding structures were obtained without and with intravenous contrast. CONTRAST:  6.91m GADAVIST GADOBUTROL 1 MMOL/ML IV SOLN COMPARISON:  Prior CT from 03/29/2022. FINDINGS: Brain: Examination moderately degraded by motion artifact. Previously identified large intraparenchymal hemorrhage centered at the left temporal lobe again seen, measuring approximately 5.9 x 4.6 x 5.0 cm (estimated volume 68 mL). This is similar in size from prior. Surrounding vasogenic edema with regional mass effect and up to 1 cm of left-to-right shift. This has progressed from prior. Secondary crowding of the basilar cisterns without transtentorial herniation. Following contrast administration, there is an irregular heterogeneous area of postcontrast enhancement along the lateral margin of the hemorrhage, measuring up to 1.4 cm in size (series 18, image 18). Some of this enhancement appears to potentially be contiguous with overlying cortical veins (series 18, image 17). Additionally, there is question of a few small vascular flow voids within this area (series 15, image 10). Few small parenchymal calcifications seen within this region on prior head CT. Findings raise the possibility for an underlying vascular lesion, although difficult to be certain given the overlying hemorrhage and motion artifact on this exam. Possible mass not entirely excluded. Intraventricular extension with moderate to large volume blood within the left greater than right  lateral ventricles, third ventricle, and fourth ventricle. There has been interval placement of a right frontal approach ventriculostomy with tip at the septum pellucidum. The right lateral ventricle is decompressed from prior exam. Ventriculomegaly involving the left lateral ventricle is relatively stable, with probable small amount of transependymal flow of CSF about the atrium. Third and fourth ventricles remain fairly normal in size. No other acute or chronic blood products seen elsewhere within the brain. No other evidence for acute or subacute infarct. Few small remote bilateral cerebellar infarcts noted. No other mass lesion or mass effect. No extra-axial collection. No other abnormal enhancement. Vascular: Major intracranial vascular flow voids are  maintained. Mass effect on the left MCA due to the adjacent hemorrhage noted. Skull and upper cervical spine: Craniocervical junction within normal limits. Bone marrow signal intensity normal. Soft tissue swelling at the right frontal scalp related to the ventriculostomy. Sinuses/Orbits: Globes and orbital soft tissues within normal limits. Paranasal sinuses are largely clear. No mastoid effusion. Other: None. IMPRESSION: 1. 68 mL intraparenchymal hemorrhage centered at the left temporal lobe, similar in size as compared to prior CT. Surrounding vasogenic edema with regional mass effect, with worsened 1 cm of left-to-right shift. 2. Irregular heterogeneous area of enhancement along the lateral margin of the hemorrhage, nonspecific, but raises the possibility for an underlying vascular lesion. Further evaluation with dedicated CTA and/or catheter directed angiogram recommended for further evaluation. Possible mass not excluded, although is felt to be less likely. 3. 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 Interval placement of a right frontal approach ventriculostomy with decompression of the right lateral ventricle.  Ventriculomegaly involving the left lateral ventricle is relatively unchanged. 4. Few small remote bilateral cerebellar infarcts. Case discussed by telephone at the time of interpretation on 03/30/2022 at 5:08 am with provider ASHISH ARORA. Electronically Signed   By: Jeannine Boga M.D.   On: 03/30/2022 05:12   DG Chest 1 View  Result Date: 03/29/2022 CLINICAL DATA:  Dyspnea. EXAM: CHEST  1 VIEW COMPARISON:  Radiograph earlier today. FINDINGS: Heart is normal in size. Normal mediastinal contours, aortic atherosclerosis. Probable calcified lymph nodes in the left hilum. There is peribronchial thickening. No acute airspace disease, pleural effusion, pulmonary edema or pneumothorax. Stable osseous structures. IMPRESSION: Peribronchial thickening likely related smoking. No localizing process. Electronically Signed   By: Keith Rake M.D.   On: 03/29/2022 23:08   CT HEAD WO CONTRAST (5MM)  Result Date: 03/29/2022 CLINICAL DATA:  Stroke, follow up EXAM: CT HEAD WITHOUT CONTRAST TECHNIQUE: Contiguous axial images were obtained from the base of the skull through the vertex without intravenous contrast. RADIATION DOSE REDUCTION: This exam was performed according to the departmental dose-optimization program which includes automated exposure control, adjustment of the mA and/or kV according to patient size and/or use of iterative reconstruction technique. COMPARISON:  CT head 03/29/2022 4:06 p.m. FINDINGS: Brain: No evidence of large-territorial acute infarction. Interval increase in size of a now 6 x 4.4 x 4.8 cm (from 4.8 x 4.4 x 4.7 cm) left frontotemporal intraparenchymal hemorrhage. Interval development of new foci intraparenchymal hemorrhage within the left uncus/medial temporal lobe. Intraventricular hemorrhage extension. No subarachnoid or subdural hematoma. Stable to minimally increased 7 mm (from 5-6 mm) left to right midline shift. Persistent impending left uncal herniation with encroachment of  the left ambient cistern. No hydrocephalus. Vascular: No hyperdense vessel. Skull: No acute fracture or focal lesion. Sinuses/Orbits: Paranasal sinuses and mastoid air cells are clear. The orbits are unremarkable. Other: None. IMPRESSION: Interval increase in size of a 6 x 4.4 x 4.8 cm left frontotemporal intraparenchymal hemorrhage. Interval development of new foci intraparenchymal hemorrhage within the left uncus/medial temporal lobe. Stable to minimally increased 7 mm (from 5-6 mm) left to right midline shift. Persistent impending left uncal herniation with encroachment on the left ambient cistern. Redemonstration of intraventricular hemorrhage. These results were called by telephone at the time of interpretation on 03/29/2022 at 6:09 pm to provider Dr Cheral Marker, who verbally acknowledged these results. Electronically Signed   By: Iven Finn M.D.   On: 03/29/2022 18:15   DG Chest Portable 1 View  Result Date: 03/29/2022 CLINICAL DATA:  Shortness of breath. EXAM: PORTABLE CHEST 1 VIEW COMPARISON:  Chest radiograph dated 10/09/2021. FINDINGS: The heart size and mediastinal contours are within normal limits. Vascular calcifications are seen in the aortic arch. Both lungs are clear. The visualized skeletal structures are unremarkable. IMPRESSION: No active disease. Aortic Atherosclerosis (ICD10-I70.0). Electronically Signed   By: Zerita Boers M.D.   On: 03/29/2022 17:12   CT HEAD WO CONTRAST (5MM)  Result Date: 03/29/2022 CLINICAL DATA:  Patient to ED via [facility] from work for altered mental status. Patient was found by coworkers in bathroom sitting by toilet. No vomit noted. Patient not wanting to answer questions or follow commands. Hx of COPD and a heavy smoker EXAM: CT HEAD WITHOUT CONTRAST CT CERVICAL SPINE WITHOUT CONTRAST TECHNIQUE: Multidetector CT imaging of the head and cervical spine was performed following the standard protocol without intravenous contrast. Multiplanar CT image reconstructions of  the cervical spine were also generated. RADIATION DOSE REDUCTION: This exam was performed according to the departmental dose-optimization program which includes automated exposure control, adjustment of the mA and/or kV according to patient size and/or use of iterative reconstruction technique. COMPARISON:  CT head 11/16/2008 FINDINGS: CT HEAD FINDINGS Brain: No evidence of large-territorial acute infarction. At least 5 x 4.5 x 4 cm intraparenchymal hemorrhage centered within the left anterior temporal and frontal lobes. Extension of the hemorrhage into the lateral, third, fourth ventricles with no definite associated hydrocephalus. Mild associated surrounding vasogenic edema. Underlying mass lesion not excluded. No definite subdural or subarachnoid hemorrhage identified. Associated 5 mm left-to-right midline shift. Impending uncal herniation on the left. Basilar cisterns are patent. Vascular: No hyperdense vessel. Skull: No acute fracture or focal lesion. Sinuses/Orbits: Paranasal sinuses and mastoid air cells are clear. The orbits are unremarkable. Other: None. CT CERVICAL SPINE FINDINGS Alignment: Normal. Skull base and vertebrae: Multilevel degenerative changes of the spine. No acute fracture. No aggressive appearing focal osseous lesion or focal pathologic process. Soft tissues and spinal canal: No prevertebral fluid or swelling. No visible canal hematoma. Upper chest: Paraseptal and centrilobular emphysematous changes. Biapical pleural/pulmonary scarring. Other: None. IMPRESSION: 1. At least 5 x 4.5 x 4 cm intraparenchymal hemorrhage centered within the left anterior temporal and frontal lobes. Associated intraventricular extension of the hemorrhage. Associated 5 mm left-to-right midline shift. Impending uncal herniation on the left. Underlying mass lesion not excluded. 2. No acute displaced fracture or traumatic listhesis of the cervical spine. 3.  Emphysema (ICD10-J43.9). These results were called by  telephone at the time of interpretation on 03/29/2022 at 4:21 pm to provider [facility] , who verbally acknowledged these results. Electronically Signed   By: Iven Finn M.D.   On: 03/29/2022 16:23   CT Cervical Spine Wo Contrast  Result Date: 03/29/2022 CLINICAL DATA:  Patient to ED via [facility] from work for altered mental status. Patient was found by coworkers in bathroom sitting by toilet. No vomit noted. Patient not wanting to answer questions or follow commands. Hx of COPD and a heavy smoker EXAM: CT HEAD WITHOUT CONTRAST CT CERVICAL SPINE WITHOUT CONTRAST TECHNIQUE: Multidetector CT imaging of the head and cervical spine was performed following the standard protocol without intravenous contrast. Multiplanar CT image reconstructions of the cervical spine were also generated. RADIATION DOSE REDUCTION: This exam was performed according to the departmental dose-optimization program which includes automated exposure control, adjustment of the mA and/or kV according to patient size and/or use of iterative reconstruction technique. COMPARISON:  CT head 11/16/2008 FINDINGS: CT HEAD FINDINGS Brain: No evidence of large-territorial  acute infarction. At least 5 x 4.5 x 4 cm intraparenchymal hemorrhage centered within the left anterior temporal and frontal lobes. Extension of the hemorrhage into the lateral, third, fourth ventricles with no definite associated hydrocephalus. Mild associated surrounding vasogenic edema. Underlying mass lesion not excluded. No definite subdural or subarachnoid hemorrhage identified. Associated 5 mm left-to-right midline shift. Impending uncal herniation on the left. Basilar cisterns are patent. Vascular: No hyperdense vessel. Skull: No acute fracture or focal lesion. Sinuses/Orbits: Paranasal sinuses and mastoid air cells are clear. The orbits are unremarkable. Other: None. CT CERVICAL SPINE FINDINGS Alignment: Normal. Skull base and vertebrae: Multilevel degenerative changes of the  spine. No acute fracture. No aggressive appearing focal osseous lesion or focal pathologic process. Soft tissues and spinal canal: No prevertebral fluid or swelling. No visible canal hematoma. Upper chest: Paraseptal and centrilobular emphysematous changes. Biapical pleural/pulmonary scarring. Other: None. IMPRESSION: 1. At least 5 x 4.5 x 4 cm intraparenchymal hemorrhage centered within the left anterior temporal and frontal lobes. Associated intraventricular extension of the hemorrhage. Associated 5 mm left-to-right midline shift. Impending uncal herniation on the left. Underlying mass lesion not excluded. 2. No acute displaced fracture or traumatic listhesis of the cervical spine. 3.  Emphysema (ICD10-J43.9). These results were called by telephone at the time of interpretation on 03/29/2022 at 4:21 pm to provider [employer] , who verbally acknowledged these results. Electronically Signed   By: Iven Finn M.D.   On: 03/29/2022 16:23    Labs:  Basic Metabolic Panel: No results for input(s): "NA", "K", "CL", "CO2", "GLUCOSE", "BUN", "CREATININE", "CALCIUM", "MG", "PHOS" in the last 168 hours.   CBC: No results for input(s): "WBC", "NEUTROABS", "HGB", "HCT", "MCV", "PLT" in the last 168 hours.   CBG: No results for input(s): "GLUCAP" in the last 168 hours.  Family history.  Mother with breast cancer.  Father with hypertension diabetes.  Denies any colon cancer esophageal cancer or rectal cancer  Brief HPI:   [name] Giles is a 66 y.o. right-handed female with history of COPD/tobacco use.  Per chart review lives with son.  Independent prior to admission.  Presented 03/29/2022 after being found down with headache as well as vomiting and altered mental status.  CT showed 5 x 4.5 x 4 cm intraparenchymal hemorrhage centered within the left anterior temporal and frontal lobes.  5 mm left to right midline shift as well as hydrocephalus.  CT angiogram head and neck no evidence of dural venous sinus  thrombosis.  No large vessel occlusion.  Admission chemistries unremarkable except CK 61 troponin negative urine drug screen negative.  Echocardiogram with ejection fraction of 60 to 65% no wall motion abnormalities.  Initially maintained on 3% hypertonic saline.  Patient did undergo placement of right frontal ventriculostomy 03/29/2022 per Dr. Arnoldo Morale removed 8/18.  Attempts at cerebral angiogram aborted due to primarily significant patient motion.  Latest follow-up CT scan 04/12/2022 was stable noting decreased mass effect with decreasing edema around the hemorrhage.  She was cleared to begin subcutaneous heparin for DVT prophylaxis 04/01/2022.  Patient course delirium initially on Precedex maintained on Seroquel.  Bouts of urinary retention placed on low-dose Urecholine.  Patient spiked a low-grade fever 102.5 leukocytosis [id number] and monitored.  Chest x-ray showed no edema or pulmonary consolidation urinalysis negative blood cultures no growth to date.  Placed empirically on Rocephin as well as vancomycin transitioned to Unasyn 828 2023 x 3 days and stop.  Dysphagia #1 thin liquid diet.  Therapy evaluations completed due to patient  decreased functional mobility was admitted for a comprehensive rehab program.   Hospital Course: CHALLIS CRILL was admitted to rehab 04/16/2022 for inpatient therapies to consist of PT, ST and OT at least three hours five days a week. Past admission physiatrist, therapy team and rehab RN have worked together to provide customized collaborative inpatient rehab.  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  Contact made to neurosurgery in regards to plan formal catheter angiogram evaluation as an out patient.  Patient had been cleared for subcutaneous heparin during her rehabilitation hospital course no bleeding episodes.  Mood with some agitation melatonin and Seroquel as advised and since stopped.  History of  COPD tobacco use monitoring of oxygen saturations receiving counts regards to cessation of nicotine products.  Bouts of hypotension improved and low dose Norvasc resumed.  Urinary retention checking PVRs low-dose Urecholine as advised.  Crestor for hyperlipidemia.  Her diet had been advanced to a regular consistency.  Noted leukocytosis low-grade fever follow-up for infectious disease.  CT chest abdomen pelvis showed no acute abnormalities and latest chest x-ray with mild atelectasis otherwise negative.  SARS coronavirus negative.  Sedimentation rate mildly elevated at 58.  Blood cultures 1/2 Shingomonas bacteremia possible contaminant but not atypical organisms seen to contaminate the blood.  No obvious source of bacteremia.  Her latest WBC is much improved 10,300.  It was still unclear initial source of fever and she was completing a course of ceftriaxone.  Patient did sustain a fall during her rehabilitation stay CT of the head was completed showing no acute changes.   Blood pressures were monitored on TID basis and soft and monitore   Rehab course: During patient's stay in rehab weekly team conferences were held to monitor patient's progress, set goals and discuss barriers to discharge. At admission, patient required minimal assist sit to stand minimal assist step pivot transfers min mod assist 80 feet rolling walker  Physical exam.  Blood pressure 115/48 pulse 88 temperature 99.5 respirations 18 oxygen saturations 99% room air Constitutional.  No acute distress HEENT.  Stable suture site clean and dry Eyes.  Pupils round and reactive to light no discharge without nystagmus Neck.  Supple nontender no JVD without thyromegaly Cardiac regular rate and rhythm without any extra sounds or murmur heard Abdomen.  Soft nontender positive bowel sounds without rebound Respiratory effort normal no respiratory distress without wheeze Neurologic.  Patient was alert makes eye contact with examiner follows  simple commands.  She did need some extra time with cues.  Word salad with occasional appropriate response to simple questions.  Moves all 4 limbs.  He/She  has had improvement in activity tolerance, balance, postural control as well as ability to compensate for deficits. He/She has had improvement in functional use RUE/LUE  and RLE/LLE as well as improvement in awareness.  Supine to sit edge of bed supervision.  Sit to stand rolling walker supervision.  Ambulates rolling walker with assistance of contact-guard.  Completes 6 inch steps and handrails contact-guard.  Occupational Therapy follow-up patient exhibiting deficits and decreased coordination and attention as well as awareness.  She does perseverate with washing her face needing cues.  Speech therapy follow-up for aphasia overall mod max assist verbal and visual cues needed.  She was easily frustrated during speech therapy.  Family continued with family teaching and plan for assisted living facility discharge       Disposition: Discharge to assisted living facility    Diet: Regular  Special Instructions: No driving smoking or alcohol  Medications at discharge 1.  Tylenol as needed 2.  Urecholine 5 mg p.o. 2 times daily x1 week 3.  Melatonin 3 mg p.o. nightly 4.  Multivitamin daily 5.  Norvasc 2.5 mg daily 6.  Symbicort 160-4.5 mcg 2 puffs daily 7.  Crestor 20 mg p.o. daily 8.  Senokot S1 tablet p.o. twice daily 9.  Vitamin D 50,000 units every 7 days 10.  Albuterol inhaler 1 to 2 puffs every 4 hours as needed shortness of breath    30-35 minutes were spent completing discharge summary and discharge planning  Discharge Instructions     Ambulatory referral to Neurology   Complete by: As directed    An appointment is requested in approximately: 4 weeks [place]   Ambulatory referral to Physical Medicine Rehab   Complete by: As directed    Moderate complexity follow-up 1 to 2 weeks [facility]        Follow-up Information      Raulkar, Clide Deutscher, MD Follow up.   Specialty: Physical Medicine and Rehabilitation Why: Office to call for appointment Contact information: [address] [place] [address] [phone]         [name], MD Follow up.   Specialty: Neurosurgery Why: Call for appointment to discuss cerebral angiogram Contact information: [address] [place] [address] [phone]         Redmond School, MD Follow up.   Specialty: Internal Medicine Contact information: [address] [place] [address] [phone]                 Signed: Lavon Paganini [place] 04/27/2022, 5:25 AM

## 2022-04-24 NOTE — Progress Notes (Signed)
Occupational Therapy Session Note  Patient Details  Name: [name] Giles MRN: [id number] Date of Birth: Feb 25, 1956  Today's Date: 04/24/2022 OT Individual Time: 2703-5009 OT Individual Time Calculation (min): 41 min    Short Term Goals: Week 2:     Skilled Therapeutic Interventions/Progress Updates:    Patient received supine in bed.  Patient had just been up with nursing staff to use the bathroom.  Patient agreeable to OT session.  Walked with patient to ADL apartment using RW and supervision.  Patient rested briefly  in recliner after long walk.  Had patient make bed - moving safely around the bed without a device.  Patient very precise with this familiar functional task.   Practiced stepping into and out of tub/shower with use of grab bars.   Walked back to patient's room, and when on her wing - she was able to locate her room with questioning cues.   Patient returned to bed with bed alarm engaged, and personal items/ call bell within reach.    Therapy Documentation Precautions:  Precautions Precautions: Fall Precaution Comments: expressive > receptive aphasia Restrictions Weight Bearing Restrictions: No  Pain: No report of pain  Therapy/Group: Individual Therapy  [name] 04/24/2022, 3:01 PM

## 2022-04-24 NOTE — Progress Notes (Signed)
Subjective: No new complaints   Antibiotics:  Anti-infectives (From admission, onward)    Start     Dose/Rate Route Frequency Ordered Stop   04/24/22 1400  ceFAZolin (ANCEF) IVPB 2g/100 mL premix        2 g 200 mL/hr over 30 Minutes Intravenous Every 8 hours 04/24/22 1050 04/26/22 0559   04/23/22 2000  cefTRIAXone (ROCEPHIN) 2 g in sodium chloride 0.9 % 100 mL IVPB  Status:  Discontinued        2 g 200 mL/hr over 30 Minutes Intravenous Every 12 hours 04/23/22 1204 04/24/22 1049   04/19/22 2000  cefTRIAXone (ROCEPHIN) 2 g in sodium chloride 0.9 % 100 mL IVPB  Status:  Discontinued        2 g 200 mL/hr over 30 Minutes Intravenous Every 12 hours 04/19/22 1908 04/23/22 1204   04/18/22 1800  ampicillin-sulbactam (UNASYN) 1.5 g in sodium chloride 0.9 % 100 mL IVPB  Status:  Discontinued        1.5 g 200 mL/hr over 30 Minutes Intravenous Every 6 hours 04/18/22 1420 04/18/22 1422   04/18/22 1600  ampicillin-sulbactam (UNASYN) 1.5 g in sodium chloride 0.9 % 100 mL IVPB  Status:  Discontinued        1.5 g 200 mL/hr over 30 Minutes Intravenous Every 6 hours 04/18/22 1422 04/19/22 1123   04/16/22 1930  ampicillin-sulbactam (UNASYN) 1.5 g in sodium chloride 0.9 % 100 mL IVPB  Status:  Discontinued        1.5 g 200 mL/hr over 30 Minutes Intravenous Every 6 hours 04/16/22 1737 04/18/22 1420       Medications: Scheduled Meds:  arformoterol  15 mcg Nebulization BID   bethanechol  5 mg Oral TID   budesonide (PULMICORT) nebulizer solution  0.25 mg Nebulization BID   feeding supplement  237 mL Oral BID BM   heparin injection (subcutaneous)  5,000 Units Subcutaneous Q8H   melatonin  3 mg Oral QHS   multivitamin with minerals  1 tablet Oral Daily   mouth rinse  15 mL Mouth Rinse 4 times per day   revefenacin  175 mcg Nebulization Daily   rosuvastatin  20 mg Oral Daily   senna-docusate  1 tablet Oral BID   Vitamin D (Ergocalciferol)  50,000 Units Oral Q7 days   Continuous  Infusions:   ceFAZolin (ANCEF) IV     PRN Meds:.acetaminophen **OR** acetaminophen (TYLENOL) oral liquid 160 mg/5 mL **OR** acetaminophen, ipratropium-albuterol, mouth rinse    Objective: Weight change:   Intake/Output Summary (Last 24 hours) at 04/24/2022 1438 Last data filed at 04/24/2022 0805 Gross per 24 hour  Intake 200 ml  Output 200 ml  Net 0 ml    Blood pressure 125/63, pulse 95, temperature 98.7 F (37.1 C), resp. rate 18, SpO2 93 %. Temp:  [98.2 F (36.8 C)-98.7 F (37.1 C)] 98.7 F (37.1 C) (09/05 0438) Pulse Rate:  [93-100] 95 (09/05 0905) Resp:  [15-18] 18 (09/05 0905) BP: (125-139)/(63-68) 125/63 (09/05 0438) SpO2:  [93 %-98 %] 93 % (09/05 0909)  Physical Exam: Physical Exam Constitutional:      General: She is not in acute distress.    Appearance: She is well-developed. She is not diaphoretic.  HENT:     Head: Normocephalic and atraumatic.     Right Ear: External ear normal.     Left Ear: External ear normal.     Mouth/Throat:     Pharynx: No oropharyngeal exudate.  Eyes:  General: No scleral icterus.    Conjunctiva/sclera: Conjunctivae normal.     Pupils: Pupils are equal, round, and reactive to light.  Cardiovascular:     Rate and Rhythm: Normal rate and regular rhythm.  Pulmonary:     Effort: Pulmonary effort is normal. No respiratory distress.     Breath sounds: No wheezing or rales.  Abdominal:     General: Bowel sounds are normal. There is no distension.     Palpations: Abdomen is soft.     Tenderness: There is no abdominal tenderness. There is no rebound.  Musculoskeletal:        General: No tenderness. Normal range of motion.  Lymphadenopathy:     Cervical: No cervical adenopathy.  Skin:    General: Skin is warm and dry.     Coloration: Skin is not pale.     Findings: No erythema or rash.  Neurological:     Mental Status: She is alert.     Motor: No abnormal muscle tone.     Coordination: Coordination normal.     Comments: Word  salad, expressive aphasia     No evidence of phlebitis  CBC:    BMET No results for input(s): "NA", "K", "CL", "CO2", "GLUCOSE", "BUN", "CREATININE", "CALCIUM" in the last 72 hours.    Liver Panel  No results for input(s): "PROT", "ALBUMIN", "AST", "ALT", "ALKPHOS", "BILITOT", "BILIDIR", "IBILI" in the last 72 hours.     Sedimentation Rate No results for input(s): "ESRSEDRATE" in the last 72 hours.  C-Reactive Protein No results for input(s): "CRP" in the last 72 hours.   Micro Results: Recent Results (from the past 720 hour(s))  SARS Coronavirus 2 by RT PCR (hospital order, performed in [facility] hospital lab) *cepheid single result test* Anterior Nasal Swab     Status: None   Collection Time: 03/29/22  3:35 PM   Specimen: Anterior Nasal Swab  Result Value Ref Range Status   SARS Coronavirus 2 by RT PCR NEGATIVE NEGATIVE Final    Comment: (NOTE) SARS-CoV-2 target nucleic acids are NOT DETECTED.  The SARS-CoV-2 RNA is generally detectable in upper and lower respiratory specimens during the acute phase of infection. The lowest concentration of SARS-CoV-2 viral copies this assay can detect is 250 copies / mL. A negative result does not preclude SARS-CoV-2 infection and should not be used as the sole basis for treatment or other patient management decisions.  A negative result may occur with improper specimen collection / handling, submission of specimen other than nasopharyngeal swab, presence of viral mutation(s) within the areas targeted by this assay, and inadequate number of viral copies (<250 copies / mL). A negative result must be combined with clinical observations, patient history, and epidemiological information.  Fact Sheet for Patients:   https://www.patel.info/  Fact Sheet for Healthcare Providers: https://hall.com/  This test is not yet approved or  cleared by the [place] FDA and has been authorized  for detection and/or diagnosis of SARS-CoV-2 by FDA under an Emergency Use Authorization (EUA).  This EUA will remain in effect (meaning this test can be used) for the duration of the COVID-19 declaration under Section 564(b)(1) of the Act, 21 U.S.C. section 360bbb-3(b)(1), unless the authorization is terminated or revoked sooner.  Performed at [facility], [address] [place], [place] [address]   MRSA Next Gen by PCR, Nasal     Status: None   Collection Time: 03/29/22  8:40 PM   Specimen: Nasal Mucosa; Nasal Swab  Result Value  Ref Range Status   MRSA by PCR Next Gen NOT DETECTED NOT DETECTED Final    Comment: (NOTE) The GeneXpert MRSA Assay (FDA approved for NASAL specimens only), is one component of a comprehensive MRSA colonization surveillance program. It is not intended to diagnose MRSA infection nor to guide or monitor treatment for MRSA infections. Test performance is not FDA approved in patients less than 31 years old. Performed at [facility], [place] [address] [place], [place] [address]   Culture, blood (Routine X 2) w Reflex to ID Panel     Status: None   Collection Time: 04/12/22  8:49 AM   Specimen: BLOOD  Result Value Ref Range Status   Specimen Description BLOOD SITE NOT SPECIFIED  Final   Special Requests   Final    BOTTLES DRAWN AEROBIC AND ANAEROBIC Blood Culture adequate volume   Culture   Final    NO GROWTH 5 DAYS Performed at [facility], [address] [place], [place] [address]    Report Status 04/17/2022 FINAL  Final  Culture, blood (Routine X 2) w Reflex to ID Panel     Status: None   Collection Time: 04/12/22  8:49 AM   Specimen: BLOOD  Result Value Ref Range Status   Specimen Description BLOOD SITE NOT SPECIFIED  Final   Special Requests   Final    BOTTLES DRAWN AEROBIC AND ANAEROBIC Blood Culture adequate volume   Culture   Final    NO GROWTH 5 DAYS Performed at [facility], [place] [address] [place],  [place] [address]    Report Status 04/17/2022 FINAL  Final  Urine Culture     Status: None   Collection Time: 04/18/22  3:25 AM   Specimen: Urine, Clean Catch  Result Value Ref Range Status   Specimen Description URINE, CLEAN CATCH  Final   Special Requests NONE  Final   Culture   Final    NO GROWTH Performed at [facility], [place] [address] [place], [place] [address]    Report Status 04/19/2022 FINAL  Final  Culture, blood (Routine X 2) w Reflex to ID Panel     Status: None   Collection Time: 04/18/22  2:56 PM   Specimen: BLOOD  Result Value Ref Range Status   Specimen Description BLOOD LEFT ANTECUBITAL  Final   Special Requests   Final    BOTTLES DRAWN AEROBIC AND ANAEROBIC Blood Culture adequate volume   Culture   Final    NO GROWTH 5 DAYS Performed at [facility], [place] [address], [place], [place] [address]    Report Status 04/23/2022 FINAL  Final  Culture, blood (Routine X 2) w Reflex to ID Panel     Status: Abnormal   Collection Time: 04/18/22  2:57 PM   Specimen: BLOOD  Result Value Ref Range Status   Specimen Description BLOOD LEFT ANTECUBITAL  Final   Special Requests   Final    BOTTLES DRAWN AEROBIC AND ANAEROBIC Blood Culture adequate volume   Culture  Setup Time   Final    GRAM NEGATIVE RODS AEROBIC BOTTLE ONLY CRITICAL RESULT CALLED TO, READ BACK BY AND VERIFIED WITH: [place]  ON 04/19/22 @ 9357 BY DRT Performed at [facility], [place] [address], [place], [place] [address]    Culture SPHINGOMONAS PAUCIMOBILIS (A)  Final   Report Status 04/22/2022 FINAL  Final   Organism ID, Bacteria SPHINGOMONAS PAUCIMOBILIS  Final  Susceptibility   Sphingomonas paucimobilis - MIC*    CEFAZOLIN <=4 SENSITIVE Sensitive     GENTAMICIN <=1 SENSITIVE Sensitive     CIPROFLOXACIN <=0.25 SENSITIVE Sensitive     IMIPENEM <=0.25 SENSITIVE Sensitive     TRIMETH/SULFA <=20 SENSITIVE Sensitive     * SPHINGOMONAS PAUCIMOBILIS  Blood Culture ID Panel (Reflexed)      Status: None   Collection Time: 04/18/22  2:57 PM  Result Value Ref Range Status   Enterococcus faecalis NOT DETECTED NOT DETECTED Final   Enterococcus Faecium NOT DETECTED NOT DETECTED Final   Listeria monocytogenes NOT DETECTED NOT DETECTED Final   Staphylococcus species NOT DETECTED NOT DETECTED Final   Staphylococcus aureus (BCID) NOT DETECTED NOT DETECTED Final   Staphylococcus epidermidis NOT DETECTED NOT DETECTED Final   Staphylococcus lugdunensis NOT DETECTED NOT DETECTED Final   Streptococcus species NOT DETECTED NOT DETECTED Final   Streptococcus agalactiae NOT DETECTED NOT DETECTED Final   Streptococcus pneumoniae NOT DETECTED NOT DETECTED Final   Streptococcus pyogenes NOT DETECTED NOT DETECTED Final   A.calcoaceticus-baumannii NOT DETECTED NOT DETECTED Final   Bacteroides fragilis NOT DETECTED NOT DETECTED Final   Enterobacterales NOT DETECTED NOT DETECTED Final   Enterobacter cloacae complex NOT DETECTED NOT DETECTED Final   Escherichia coli NOT DETECTED NOT DETECTED Final   Klebsiella aerogenes NOT DETECTED NOT DETECTED Final   Klebsiella oxytoca NOT DETECTED NOT DETECTED Final   Klebsiella pneumoniae NOT DETECTED NOT DETECTED Final   Proteus species NOT DETECTED NOT DETECTED Final   Salmonella species NOT DETECTED NOT DETECTED Final   Serratia marcescens NOT DETECTED NOT DETECTED Final   Haemophilus influenzae NOT DETECTED NOT DETECTED Final   Neisseria meningitidis NOT DETECTED NOT DETECTED Final   Pseudomonas aeruginosa NOT DETECTED NOT DETECTED Final   Stenotrophomonas maltophilia NOT DETECTED NOT DETECTED Final   Candida albicans NOT DETECTED NOT DETECTED Final   Candida auris NOT DETECTED NOT DETECTED Final   Candida glabrata NOT DETECTED NOT DETECTED Final   Candida krusei NOT DETECTED NOT DETECTED Final   Candida parapsilosis NOT DETECTED NOT DETECTED Final   Candida tropicalis NOT DETECTED NOT DETECTED Final   Cryptococcus neoformans/gattii NOT DETECTED  NOT DETECTED Final    Comment: Performed at [facility], [address], [place], [place] [address]  SARS Coronavirus 2 by RT PCR (hospital order, performed in [facility] hospital lab) *cepheid single result test* Anterior Nasal Swab     Status: None   Collection Time: 04/18/22  3:03 PM   Specimen: Anterior Nasal Swab  Result Value Ref Range Status   SARS Coronavirus 2 by RT PCR NEGATIVE NEGATIVE Final    Comment: (NOTE) SARS-CoV-2 target nucleic acids are NOT DETECTED.  The SARS-CoV-2 RNA is generally detectable in upper and lower respiratory specimens during the acute phase of infection. The lowest concentration of SARS-CoV-2 viral copies this assay can detect is 250 copies / mL. A negative result does not preclude SARS-CoV-2 infection and should not be used as the sole basis for treatment or other patient management decisions.  A negative result may occur with improper specimen collection / handling, submission of specimen other than nasopharyngeal swab, presence of viral mutation(s) within the areas targeted by this assay, and inadequate number of viral copies (<250 copies / mL). A negative result must be combined with clinical observations, patient history, and epidemiological information.  Fact Sheet for Patients:   https://www.patel.info/  Fact Sheet for Healthcare Providers: https://hall.com/  This test is not yet  approved or  cleared by the [place] and has been authorized for detection and/or diagnosis of SARS-CoV-2 by FDA under an Emergency Use Authorization (EUA).  This EUA will remain in effect (meaning this test can be used) for the duration of the COVID-19 declaration under Section 564(b)(1) of the Act, 21 U.S.C. section 360bbb-3(b)(1), unless the authorization is terminated or revoked sooner.  Performed at [facility], [place] [address] [place], [place] [address]   SARS Coronavirus 2 by RT PCR  (hospital order, performed in [facility] hospital lab) *cepheid single result test* Anterior Nasal Swab     Status: None   Collection Time: 04/20/22 10:26 AM   Specimen: Anterior Nasal Swab  Result Value Ref Range Status   SARS Coronavirus 2 by RT PCR NEGATIVE NEGATIVE Final    Comment: (NOTE) SARS-CoV-2 target nucleic acids are NOT DETECTED.  The SARS-CoV-2 RNA is generally detectable in upper and lower respiratory specimens during the acute phase of infection. The lowest concentration of SARS-CoV-2 viral copies this assay can detect is 250 copies / mL. A negative result does not preclude SARS-CoV-2 infection and should not be used as the sole basis for treatment or other patient management decisions.  A negative result may occur with improper specimen collection / handling, submission of specimen other than nasopharyngeal swab, presence of viral mutation(s) within the areas targeted by this assay, and inadequate number of viral copies (<250 copies / mL). A negative result must be combined with clinical observations, patient history, and epidemiological information.  Fact Sheet for Patients:   https://www.patel.info/  Fact Sheet for Healthcare Providers: https://hall.com/  This test is not yet approved or  cleared by the [place] FDA and has been authorized for detection and/or diagnosis of SARS-CoV-2 by FDA under an Emergency Use Authorization (EUA).  This EUA will remain in effect (meaning this test can be used) for the duration of the COVID-19 declaration under Section 564(b)(1) of the Act, 21 U.S.C. section 360bbb-3(b)(1), unless the authorization is terminated or revoked sooner.  Performed at [facility], [place] [address] [place], [place] [address]     Studies/Results: No results found.    Assessment/Plan:  INTERVAL HISTORY: Shingomonas isolated from 1/2 blood cultures   Principal Problem:   ICH  (intracerebral hemorrhage) (HCC)    [name] Giles is a 66 y.o. 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  #1 FUO: had extensive work-up revealing that she did have 1 out of 2 blood cultures positive for shingomonas and his better after 5 days of ceftriaxone  #2 Shingomonas bacterejmia: in 1/2 blood cultures. COuld be a contaminant but an not a typical organisms to contaminate the blood  NO obvious source of this bacteremia such as phlebitis no evidence of deep infection  We will finish out 7 day course of antibiotics with cefazolin.  #3 ICH: per primary team and NSGY  I will sign off. Please call with further questions.     LOS: 8 days   Alcide Evener 04/24/2022, 2:38 PM

## 2022-04-25 MED ORDER — BETHANECHOL CHLORIDE 10 MG PO TABS
5.0000 mg | ORAL_TABLET | Freq: Two times a day (BID) | ORAL | Status: DC
  Administered 2022-04-25 – 2022-04-27 (×4): 5 mg via ORAL
  Filled 2022-04-25 (×4): qty 1

## 2022-04-25 MED ORDER — TUBERCULIN PPD 5 UNIT/0.1ML ID SOLN
5.0000 [IU] | Freq: Once | INTRADERMAL | Status: AC
  Administered 2022-04-25: 5 [IU] via INTRADERMAL
  Filled 2022-04-25: qty 0.1

## 2022-04-25 NOTE — Progress Notes (Signed)
PROGRESS NOTE   Subjective/Complaints: Sutures to be removed today Plan for d/c Friday Has not been eating much due to the taste of the foods  ROS:  Limited by aphasia/cognition  Objective:   No results found. No results for input(s): "WBC", "HGB", "HCT", "PLT" in the last 72 hours.  No results for input(s): "NA", "K", "CL", "CO2", "GLUCOSE", "BUN", "CREATININE", "CALCIUM" in the last 72 hours.   Intake/Output Summary (Last 24 hours) at 04/25/2022 1107 Last data filed at 04/25/2022 0859 Gross per 24 hour  Intake 25 ml  Output --  Net 25 ml        Physical Exam: Vital Signs Blood pressure 138/61, pulse 92, temperature 98.9 F (37.2 C), temperature source Oral, resp. rate 18, SpO2 95 %.  Gen: no distress, normal appearing HEENT: oral mucosa pink and moist, NCAT Cardio: Reg rate Chest: normal effort, normal rate of breathing Abd: soft, non-distended Ext: no edema Psych: pleasant, normal affect Neurological: Alert,  aphasic      Comments: Patient is alert and comfortable. Expressive>receptive aphasia. Makes eye contact with examiner.  Difficulty following simple commands with extra time, repetition and cues. Would not cooperate consistently with CN exam. Word salad with occasional appropriate response to simple questions (<10%). Sentence structure improving. Moves all 4 limbs, senses pain. No abnl tone.   Psychiatric:     Comments: Seems generally up beat, engaging.     Assessment/Plan: 1. Functional deficits which require 3+ hours per day of interdisciplinary therapy in a comprehensive inpatient rehab setting. Physiatrist is providing close team supervision and 24 hour management of active medical problems listed below. Physiatrist and rehab team continue to assess barriers to discharge/monitor patient progress toward functional and medical goals  Care Tool:  Bathing    Body parts bathed by patient: Right arm,  Left arm, Abdomen, Chest, Front perineal area, Right upper leg, Left upper leg, Right lower leg, Left lower leg, Face, Buttocks   Body parts bathed by helper: Buttocks     Bathing assist Assist Level: Supervision/Verbal cueing     Upper Body Dressing/Undressing Upper body dressing        Upper body assist Assist Level: Supervision/Verbal cueing    Lower Body Dressing/Undressing Lower body dressing      What is the patient wearing?: Underwear/pull up, Pants     Lower body assist Assist for lower body dressing: Supervision/Verbal cueing     Toileting Toileting    Toileting assist Assist for toileting: Supervision/Verbal cueing     Transfers Chair/bed transfer  Transfers assist  Chair/bed transfer activity did not occur: Safety/medical concerns  Chair/bed transfer assist level: Supervision/Verbal cueing     Locomotion Ambulation   Ambulation assist      Assist level: Supervision/Verbal cueing Assistive device: Walker-rolling Max distance: 156f   Walk 10 feet activity   Assist     Assist level: Supervision/Verbal cueing Assistive device: Walker-rolling   Walk 50 feet activity   Assist    Assist level: Supervision/Verbal cueing Assistive device: Walker-rolling    Walk 150 feet activity   Assist    Assist level: Supervision/Verbal cueing Assistive device: Walker-rolling    Walk 10 feet on uneven surface  activity   Assist     Assist level: Contact Guard/Touching assist Assistive device: Walker-rolling   Wheelchair     Assist Is the patient using a wheelchair?: No   Wheelchair activity did not occur: N/A         Wheelchair 50 feet with 2 turns activity    Assist    Wheelchair 50 feet with 2 turns activity did not occur: N/A       Wheelchair 150 feet activity     Assist  Wheelchair 150 feet activity did not occur: N/A       Blood pressure 138/61, pulse 92, temperature 98.9 F (37.2 C), temperature  source Oral, resp. rate 18, SpO2 95 %.  Medical Problem List and Plan: 1. 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   -WILL NEED FORMAL CATHETER ANGIOGRAM BEFORE DISCHARGE FROM CIR PER DR GEZMOQHUT, will discuss with NSGY that it is unlikely that patient's cognition will improve prior to discharge, will touch base again today given that discharge date is in 2 days.  -Interdisciplinary Team Conference today               -patient may shower             -ELOS/Goals: 10 days, supervision goals with PT, OT, mod assist with language  Continue CIR  -ALF being considered after discharge  2.  Antithrombotics: -DVT/anticoagulation:  Pharmaceutical: Heparin             -antiplatelet therapy: N/A 3. Pain Management: NA, d/c oxycodone 4. Insomnia: Melatonin 3 mg nightly. Discontinue Seroquel             -monitor sleep habits 5. Neuropsych/cognition: This patient is not capable of making decisions on her own behalf. 6. Scalp sutures: remove today  7. Fluids/Electrolytes/Nutrition: Routine in and outs follow-up chemistries 8.  Hyperlipidemia.  LDL reviewed and is 119, continue Crestor 9.  COPD with tobacco use.  Continue inhalers as directed.  Provide counts regards to cessation of nicotine products. Incentive spirometer ordered 10.  Hypotension: discontinue Norvasc 2.5 mg daily.  Monitor with increased mobility  9/2- BP controlled- con't regimen 9/3- BP a little elevated, but overall controlled- con't regimen and monitor trend 9/4 BP, few elevated readings yesterday, overall well controlled,  continue to monitor 11.  Urinary retention.  Decrease Urecholine to 5 mg BID.  Check PVR' and emptying patterns.   Has been continent  12.  Hyperlipidemia.  Crestor 13.  Leukocytosis.   Maintained initially on empiric IV Rocephin and vancomycin.  Transitioned to Unasyn 04/16/2022 x 3 days and stop -Continue ceftriaxone until 9/6 per  ID 14.  Dysphagia.  upgraded to regular diet 15. Suboptimal vitamin D: continue ergocalciferol 50,000U once per week for 7 weeks 16. Hypoalbuminemia: advised to choose high protein foods 17. Fall: CT head ordered and reviewed; shows improvements in size of bleed 18. Intermittent fevers: will consult ID, likely secondary to the [place] as per IF, will obtain MRI w/ contrast to r/o abscess  9/2- MRI shows no abscess-, discussed with sister.     LOS: 9 days A FACE TO FACE EVALUATION WAS PERFORMED  Ritta Hammes P Ledell Codrington 04/25/2022, 11:07 AM

## 2022-04-25 NOTE — Discharge Summary (Signed)
Physical Therapy Discharge Summary  Patient Details  Name: [name] Giles MRN: [id number] Date of Birth: 10-06-1955  Date of Discharge from PT service:April 26, 2022  Today's Date: 04/26/2022 PT Individual Time: 6734-1937 PT Individual Time Calculation (min): 60 min    Patient has met 8 of 8 long term goals due to improved activity tolerance, improved balance, improved postural control, increased strength, ability to compensate for deficits, improved attention, and improved awareness.  Patient to discharge at an ambulatory level Supervision.   Patient's care partner unavailable to provide the necessary physical and cognitive assistance at discharge. Therefore, recommendation for ALF at discharge to provide the necessary supervision.  Reasons goals not met: n/a  Recommendation:  Patient will benefit from ongoing skilled PT services in home health setting to continue to advance safe functional mobility, address ongoing impairments in home safety, dynamic standing balance, gait with LRAD, and minimize fall risk.  Equipment: RW  Reasons for discharge: treatment goals met and discharge from hospital  Patient/family agrees with progress made and goals achieved: Yes  PT Discharge Precautions/Restrictions Precautions Precautions: Fall Precaution Comments: expressive > receptive aphasia Restrictions Weight Bearing Restrictions: No Pain Interference Pain Interference Pain Effect on Sleep: 8. Unable to answer Pain Interference with Therapy Activities: 8. Unable to answer Pain Interference with Day-to-Day Activities: 8. Unable to answer Vision/Perception  Vision - History Ability to See in Adequate Light: 0 Adequate (difficult to assess due to aphasia) Perception Perception: Impaired Praxis Praxis: Impaired Praxis Impairment Details: Motor planning;Initiation  Cognition Overall Cognitive Status: Impaired/Different from baseline Arousal/Alertness: Awake/alert Orientation Level:  Oriented to person Attention: Focused;Sustained;Selective Focused Attention: Appears intact Focused Attention Impairment: Verbal basic;Verbal complex Sustained Attention: Impaired Sustained Attention Impairment: Verbal basic;Functional basic Selective Attention: Impaired Selective Attention Impairment: Verbal basic;Functional basic Memory: Impaired Memory Impairment: Decreased recall of new information;Decreased short term memory;Retrieval deficit Decreased Short Term Memory: Verbal basic;Functional basic Awareness: Impaired Awareness Impairment: Emergent impairment;Anticipatory impairment Problem Solving: Impaired Problem Solving Impairment: Verbal basic;Functional basic Safety/Judgment: Impaired Sensation Sensation Light Touch: Appears Intact Hot/Cold: Appears Intact Proprioception: Impaired by gross assessment Stereognosis: Not tested Coordination Gross Motor Movements are Fluid and Coordinated: No Fine Motor Movements are Fluid and Coordinated: No Motor  Motor Motor: Motor apraxia Motor - Discharge Observations: Much improved since date of evaluation. Continues to demonstrate motor planning deficits and awareness impairments that affect functional mobility tasks.  Mobility Bed Mobility Bed Mobility: Rolling Right;Rolling Left;Supine to Sit;Sit to Supine Rolling Right: Supervision/verbal cueing Rolling Left: Supervision/Verbal cueing Supine to Sit: Supervision/Verbal cueing Sit to Supine: Supervision/Verbal cueing Transfers Transfers: Sit to Stand;Stand to Sit;Stand Pivot Transfers Sit to Stand: Supervision/Verbal cueing Stand to Sit: Supervision/Verbal cueing Stand Pivot Transfers: Supervision/Verbal cueing Stand Pivot Transfer Details: Verbal cues for precautions/safety;Verbal cues for safe use of DME/AE Transfer (Assistive device): None Locomotion  Gait Ambulation: Yes Gait Assistance: Supervision/Verbal cueing Gait Distance (Feet): 150 Feet Assistive device:  Rolling walker Gait Assistance Details: Verbal cues for precautions/safety;Verbal cues for safe use of DME/AE;Verbal cues for gait pattern Gait Gait: Yes Gait Pattern: Impaired Gait Pattern: Step-through pattern;Shuffle;Lateral trunk lean to right;Trunk flexed Gait velocity: decreased Stairs / Additional Locomotion Stairs: Yes Stairs Assistance: Contact Guard/Touching assist Stair Management Technique: Two rails;Forwards Number of Stairs: 12 Height of Stairs: 6 Wheelchair Mobility Wheelchair Mobility: No  Trunk/Postural Assessment  Cervical Assessment Cervical Assessment: Within Functional Limits Thoracic Assessment Thoracic Assessment: Exceptions to [facility] (rounded shoulders) Lumbar Assessment Lumbar Assessment: Exceptions to [facility] (flexible posterior pelvic tilt in sitting) Postural Control Postural Control: Deficits  on evaluation Righting Reactions: mild delay Protective Responses: mild delay  Balance Balance Balance Assessed: Yes Static Sitting Balance Static Sitting - Balance Support: Feet supported;No upper extremity supported Static Sitting - Level of Assistance: 7: Independent Dynamic Sitting Balance Dynamic Sitting - Balance Support: Feet supported;No upper extremity supported Dynamic Sitting - Level of Assistance: 6: Modified independent (Device/Increase time) Static Standing Balance Static Standing - Balance Support: No upper extremity supported Static Standing - Level of Assistance: 5: Stand by assistance Dynamic Standing Balance Dynamic Standing - Balance Support: During functional activity;Bilateral upper extremity supported Dynamic Standing - Level of Assistance: 5: Stand by assistance Extremity Assessment      RLE Assessment RLE Assessment: Exceptions to [facility] General Strength Comments: Grossly 4/5 LLE Assessment LLE Assessment: Exceptions to [facility] General Strength Comments: Grossly 4/5  Skilled Intervention: Pt presents sleeping in bed - slow to wake up but  agreeable to PT tx once she does. She denies pain and reports being "cold." Continues to have improved aphasia with ability to understand questions and commands. Improved awareness noted also.   Supine<>Sitting EOB with supervision with HOB elevated. Provided her a jacket as she reports being cold. Able to don jacket without assist. She reports need to void. Sit<>Stand with no AD and supervision. Ambulates with distant supervision and RW to bathroom and she completes 3/3 toileting tasks without assist - charted in flowsheets as she's continent of bladder.   Ambulates sinkside with supervision and RW. She completes hand hygiene and brushes her teeth with supervision - she requires question cues to locate and apply toothpaste as she originally attempts to apply hand sanitizer. While seated EOB she dons socks and tennis shoes with setupA.  Ambulated to main rehab gym with supervision and RW, ~175f. After brief seated rest break in gym, pt requesting to go outside for fresh air. Ambulated with RW and supervision from CIR floor downstairs (standing rest break in elevator) and [facility] Gait training outdoors on unlevel surface with no AD (pt requesting HHA) and CGA. Outdoor ambulation up/down moderately steep hills, across busy sidewalks, and transfers to park benches.   Returned inside and [facility] with CGA and no AD - practiced word finding and naming objects that she sees.   Returned to CIR floor and ambulated back to her room with CGA and no AD, >3573f Bed mobility completed without assist. Bed alarm on, all needs met.    Damali Broadfoot P Lilyan Prete PT 04/25/2022, 7:57 AM

## 2022-04-25 NOTE — Progress Notes (Signed)
Occupational Therapy Session Note  Patient Details  Name: [name] Giles MRN: [id number] Date of Birth: 01/09/1956  Today's Date: 04/25/2022 OT Individual Time: 7416-3845 OT Individual Time Calculation (min): 74 min    Short Term Goals: Week 2:   STG = LTG  Skilled Therapeutic Interventions/Progress Updates:    Patient received supine in bed.  Encouraged patient to get up out of bed and eat breakfast.  Patient able to feed herself, however, limited appetite.  Assistance for seasoning foods and drinks.   Patient ambulated to bathroom without device with supervision and cueing.  Patient had continent void on toilet,then transferred to shower bench.  Patient offered less cueing today - when patient asked what to do next used questioning cues back - what makes sense?  Patient able to logically sequence taking off clothing before turning on shower.  Patient continues to need cueing for appropriate showering sequence - eg.  Shampoo in hand and in hair versus on washcloth and on face.  Conditioner follows shampoo, etc.  Patient easily directed and looks for cueing to help with process.  Once she starts a familiar task she can complete with supervision.   Patient able to brush and blow dry her hair with only one rest break.    Had patient make her bed.  Patient familiar with this task, and able to move around the bed without LOB and without device.  Patient left up in wheelchair for short break before next session.  Seat belt alarm in place and engaged, and personal items and call bell in reach.     Therapy Documentation Precautions:  Precautions Precautions: Fall Precaution Comments: expressive > receptive aphasia Restrictions Weight Bearing Restrictions: Yes  Pain: Pain Assessment Pain Scale: 0-10 Pain Score: 0-No pain        Therapy/Group: Individual Therapy  [name] 04/25/2022, 12:11 PM

## 2022-04-25 NOTE — Progress Notes (Addendum)
Patient ID: [name] Giles, female   DOB: December 03, 1955, 66 y.o.   MRN: [id number] Message left for Kathy-sister regarding team conference and updates. Will await return call. Made aware pt wants to speak with her.  2:45 PM Spoke with kathy-sister to give her the team conference update and make aware pt wants to talk with her. She is planning no coming to see her today and will talk with her.She did tell her she was going to [facility] on Friday for more care and rehab. Have ordered her a rolling walker to take with her and Juliann Pulse will transport her to facility on Friday. Await response from facility regarding paperwork sent today to facility.

## 2022-04-25 NOTE — Progress Notes (Signed)
Physical Therapy Session Note  Patient Details  Name: [name] Giles MRN: [id number] Date of Birth: April 12, 1956  Today's Date: 04/25/2022 PT Individual Time: 0945-1000 + 1300-1410 PT Individual Time Calculation (min): 15 min + +70 min PT Missed Time: 15 Minutes Missed Time Reason: Unavailable (Comment);Other (Comment) (Pt getting breathing treatment)  Short Term Goals: Week 2:  PT Short Term Goal 1 (Week 2): STG = LTG  Skilled Therapeutic Interventions/Progress Updates:       1st session: Pt missed first 15 minutes of session as she was receiving breathing treatment.  Returned and patient sitting in w/c - agreeable to PT tx. Denies pain, remains severely aphasic but this is improving.   Sit<>stand with supervision and no AD. Ambulates from her room to main rehab gym, ~173f, with CGA and no AD - mild lateral instability but no formal LOB present.   Stair training up/down x12 steps with supervision assist using 2 hand rails and 6inch steps. Reciprocal stepping for both ascent and descent and patient with fair awareness of safety.   Ambulated back to her room with CGA and no AD - cues for increasing upright posture, forward gaze, and gait speed.   Concluded session in bed with patient completing bed mobility without assist and HOB flat. All needs met, bed alarm on.     2nd session: Pt resting in bed - agreeable to PT tx but slow to move. Continues to have expressive aphasia with word salad. Directed into supine<>sitting with CGA for initiation. Sit<>stand with supervision and no AD and she ambulates to bathroom with supervision. She completes 3/3 toileting tasks without assist, continent of bladder. Ambulates sinkside and she washs hands without assist.   Ambulates to ADL apartment suite with close supervision and no AD, >2029f Seated rest break on couch and completes furniture transfers with supervision and no AD.   In ADL apartment, worked on itDevelopment worker, communityPt  unable to locate simple items (fork, cup, etc) without max cues. Impaired problem solving and decreased awareness using context clues to locate items. Pt also with some motor apraxia during these tasks. For example, she would place dish soap on her hands and then use the dish scrub to scrub her hands with soap. When she was handed a microwave meal, she was unable to problem solve how to prepare/cook it and was unable to consider where to follow instructions. When provided instructions, she was unable to locate microwave and was trying to wash the boxed meal in the sink.  Completed BITS activity sitting in chair (pt deferring standing due to fatigue). Completed various activities to challenge attention, visual scanning, problem solving, sequencing.  -Trail making #1-#25 - Provided brief demonstration to improve understanding/instructions. Pt completed with only min cues (!!) for sequencing and for locating #'s. She had no errors and was able to complete task efficiently.  -Maze Test - pt completed without cues or errors, able to understand instructions without cues.   - Bell Cancellation test - provided brief demonstration to improve understanding/instructions. Pt able to complete without cues after demonstration. She was able to locate bells in the center and on the L but lacked visual scanning to the R to locate. She missed x4 Bells on the test with 2 of them being on the R, one being in the center, and one being on the L [place] task - completed without cues or assist - able to understand verbal instructions well and completed complex duplication without difficulty -  able to recognize errors when they're made.  Instructed in curb training on 8inch curb - no AD with CGA provided for safety - pt reaching out for hand rails for UE support but she's able to complete without if needed. Cues for safety awareness and safety approach.   Ambulated back to her room, ~137f, with close  supervision and no AD. Bed mobility completed without assist. Bed alarm on, pt made comfortable, all needs met.    Therapy Documentation Precautions:  Precautions Precautions: Fall Precaution Comments: expressive > receptive aphasia Restrictions Weight Bearing Restrictions: No General:      Therapy/Group: Individual Therapy  [name] Giles [name] Giles [name] Giles 04/25/2022, 7:30 AM

## 2022-04-25 NOTE — Progress Notes (Signed)
Speech Language Pathology Daily Session Note  Patient Details  Name: [name] Giles MRN: [id number] Date of Birth: [birth year]  Today's Date: 04/25/2022 SLP Individual Time: 1000-1100 SLP Individual Time Calculation (min): 60 min  Short Term Goals: Week 1: SLP Short Term Goal 1 (Week 1): STGs=LTGs due to ELOS  Skilled Therapeutic Interventions: S: Pt seen this date for skilled ST intervention targeting communication goals outlined above. Pt received supine in bed, sleeping; easily aroused to name. No reports of pain; however, does endorse itching on her abdomen; RN notified. Agreeable to ST intervention at bedside.  O: SLP facilitated today's session by providing the following skilled interventions: - Confrontational naming of pictured food items - 30% accuracy without prompts, which improved to 70% accuracy given Min-Mod A verbal cues. - Receptive ID of clothing items (field of 2) - 40% accuracy given Min-Mod A verbal cues. - Answered yes/no questions re: action photos with 60% accuracy given Min A verbal cues; improved to 100% accuracy given Min-Mod A verbal cues.  - No clinical s/sx concerning for airway invasion with intake of thin liquid via cup (no straws). - Participated in conversation re: upcoming discharge and family with pt exhibiting some meaningful and fluent verbalizations given Min-Mod A verbal cues. Expressed that she has been feeling sad and depressed; MD notified and aware.  A: Pt remains sitmulable for skilled ST intervention as evident by continued improvement in verbal output, that is more coherent within functional context vs therapeutic. Continues to exhibit improvement in basic auditory comprehension for simple close-ended questions, one-step command following, and yes/no questions re: common actions. Pt declined regular textures this date despite encouragement; continue with regular textures to encourage PO intake. Per chart review, VSS.  P: Pt left in bed with all  safety measures activated. Call bell reviewed and within reach and all immediate needs met. Continue per current ST POC next session.  Pain Pain Assessment Pain Scale: 0-10 Pain Score: 0-No pain  Therapy/Group: Individual Therapy  Maclin Guerrette A Saahil Herbster 04/25/2022, 12:00 PM

## 2022-04-25 NOTE — Patient Care Conference (Signed)
Inpatient RehabilitationTeam Conference and Plan of Care Update Date: 04/25/2022   Time: 11:02 AM    Patient Name: [name] Giles      Medical Record Number: [id number]  Date of Birth: May 15, 1956 Sex: Female         Room/Bed: 4W18C/4W18C-01 Payor Info: Payor: Theme park manager / Plan: [place] / Product Type: *No Product type* /    Admit Date/Time:  04/16/2022  5:36 PM  Primary Diagnosis:  ICH (intracerebral hemorrhage) [facility])  Hospital Problems: Principal Problem:   ICH (intracerebral hemorrhage) ([place]) Active Problems:   Gram-negative bacteremia    Expected Discharge Date: Expected Discharge Date: 04/27/22  Team Members Present: Physician leading conference: Dr. Leeroy Cha Social Worker Present: Ovidio Kin, LCSW Nurse Present: Dorien Chihuahua, RN PT Present: Ginnie Smart, PT OT Present: Other (comment) Vida Roller, OT) PPS Coordinator present : Gunnar Fusi, SLP     Current Status/Progress Goal Weekly Team Focus  Bowel/Bladder   Continent xs 2  Remain Continent  Assist with timed toileting to reduce impulsiveness   Swallow/Nutrition/ Hydration   Upgraded to regular textures and remains on thin liquids. Set-Up A for meals.  Min A  Tolerance of upgraded diet textures   ADL's   Overall supervision, verbal cueing.  Significant improvement in functional mobility  supervision  family education(?- depends on D/C), ADL retraining, continue to build activity tolerance   Mobility   supervision bed mobility, supervision sit<>stand and stand<>pivot transfers using RW, supervision ambulation >371f with RW. CGA stairs using 2 rails. Expressive > receptive aphasia  Supervision  Safety awareness, endurance and activity tolerance, dynamic standing balance, DC planning. Family ed completed over holiday weekend.   Communication   Performance is variable; however, pt can communicate some immediate wants and needs re: bathroom, drinking, feeling cold, etc. with  Min A and independent use of gestures.  Max A for verbal expression of wants/needs, Mod A for use of multimodal communication  Continuing to target  functional communication   Safety/Cognition/ Behavioral Observations  Sup A for attention to therapeutic language tasks - GOAL MET  Mod A  Continue to focus on sustained attention during functional and therapeutic tasks   Pain   No pain  Keep pt pain free  keep pt pain free   Skin   skin intact  Prevent skin breakdown  Monitor and prevent skin break down     Discharge Planning:  Plan now going to ALF for one month while sister is [place] of the [place]-family was here for education on Sat and saw her care needs. TB test ordered   Team Discussion: Patient has made good progress but will require 24/7 supervision. Continue to note poor appetite due to decreased taste.   Patient on target to meet rehab goals: Yes; goals for discharge set for supervision overall.  *See Care Plan and progress notes for long and short-term goals.   Revisions to Treatment Plan:  N/A   Teaching Needs: Safety, supervision needs, medications, dietary modifications, etc.   Current Barriers to Discharge: Home enviroment access/layout, Lack of/limited family support, and and aphasia  Possible Resolutions to Barriers: Family education Recommend 24/7 care ALF pending; additional hired help  DME: RW     Medical Summary Current Status: ICH, sutures in place, depression, urinary retention, gram negative bacteremia, essential HTN, HLD, cerebral AVM, delerium  Barriers to Discharge: Medical stability  Barriers to Discharge Comments: ICH, sutures in place, depression, urinary retention, gram negative bacteremia, essential HTN, HLD, cerebral AVM, delerium Possible  Resolutions to [employer]: remove sutures today, discussed with sister that fevers are secondary to [place], discussing with NSGY angiogram prior to discharge   Continued Need for Acute Rehabilitation  Level of Care: The patient requires daily medical management by a physician with specialized training in physical medicine and rehabilitation for the following reasons: Direction of a multidisciplinary physical rehabilitation program to maximize functional independence : Yes Medical management of patient stability for increased activity during participation in an intensive rehabilitation regime.: Yes Analysis of laboratory values and/or radiology reports with any subsequent need for medication adjustment and/or medical intervention. : Yes   I attest that I was present, lead the team conference, and concur with the assessment and plan of the team.   Dorien Chihuahua B 04/25/2022, 1:56 PM

## 2022-04-26 MED ORDER — BETHANECHOL CHLORIDE 5 MG PO TABS: 5.0000 mg | ORAL_TABLET | Freq: Two times a day (BID) | ORAL | 0 refills | Status: AC

## 2022-04-26 MED ORDER — VITAMIN D (ERGOCALCIFEROL) 1.25 MG (50000 UNIT) PO CAPS: 50000.0000 [IU] | ORAL_CAPSULE | ORAL | 0 refills | Status: DC

## 2022-04-26 MED ORDER — MELATONIN 3 MG PO TABS: 3.0000 mg | ORAL_TABLET | Freq: Every day | ORAL | 0 refills | Status: AC

## 2022-04-26 MED ORDER — ALBUTEROL SULFATE HFA 108 (90 BASE) MCG/ACT IN AERS: 1.0000 | INHALATION_SPRAY | RESPIRATORY_TRACT | 0 refills | Status: AC | PRN

## 2022-04-26 MED ORDER — AMLODIPINE BESYLATE 2.5 MG PO TABS
2.5000 mg | ORAL_TABLET | Freq: Every day | ORAL | Status: DC
  Administered 2022-04-26 – 2022-04-27 (×2): 2.5 mg via ORAL
  Filled 2022-04-26 (×2): qty 1

## 2022-04-26 MED ORDER — AMLODIPINE BESYLATE 2.5 MG PO TABS: 2.5000 mg | ORAL_TABLET | Freq: Every day | ORAL | 0 refills | Status: DC

## 2022-04-26 MED ORDER — SYMBICORT 160-4.5 MCG/ACT IN AERO: 2.0000 | INHALATION_SPRAY | Freq: Every day | RESPIRATORY_TRACT | 12 refills | Status: DC

## 2022-04-26 MED ORDER — ROSUVASTATIN CALCIUM 20 MG PO TABS: 20.0000 mg | ORAL_TABLET | Freq: Every day | ORAL | 0 refills | Status: DC

## 2022-04-26 NOTE — Progress Notes (Signed)
Occupational Therapy Session Note  Patient Details  Name: [name] Giles MRN: [id number] Date of Birth: 11/09/1955  Today's Date: 04/26/2022 OT Individual Time: 7225-7505 OT Individual Time Calculation (min): 45 min    Short Term Goals: Week 1:  OT Short Term Goal 1 (Week 1): STG = LTG d/t ELOS  Skilled Therapeutic Interventions/Progress Updates:  Skilled OT intervention completed with focus on functional endurance and sequencing within a shower context. Pt received upright in bed, no c/o pain. Pt remained aphasic with some word salad however with functional enough communication to determine pt's wants/needs. Agreeable to shower.  Completed at sit > stands and ambulatory transfers in room with supervision without AD. Ambulated to toilet, doffed clothing in stance then continent of void only. Pt preferred to warm water prior to shower entry, and was able to manage water temps herself without cues, however attempted to start bathing while outside of the shower with therapist having to cue pt to at least stand or sit in shower for safety/preventing mess. Pt completed all bathing at the sit > stand level with supervision. Cues needed throughout for purpose of items I.e. shampoo for hair, soap for body. CGA ambulatory transfer out of shower 2/2 wet feet into room. Pt completed all dressing and grooming at the supervision level.   Dried hair with hair dryer using BUE to promote endurance, with pt expressing fatigue and therapist finishing. Supervision ambulation to EOB, with mod I bed mobility. Pt remained upright in bed, with sister present, bed alarm on and all needs in reach at end of session.    Therapy Documentation Precautions:  Precautions Precautions: Fall Precaution Comments: expressive > receptive aphasia Restrictions Weight Bearing Restrictions: Yes    Therapy/Group: Individual Therapy  Blase Mess, MS, OTR/L  04/26/2022, 2:36 PM

## 2022-04-26 NOTE — Progress Notes (Signed)
PROGRESS NOTE   Subjective/Complaints: Seen ambulating in hallway with [name]'s chart reviewed- No issues reported overnight Vitals signs stable except for elevated SPB  ROS:  Limited by aphasia/cognition  Objective:   No results found. No results for input(s): "WBC", "HGB", "HCT", "PLT" in the last 72 hours.  No results for input(s): "NA", "K", "CL", "CO2", "GLUCOSE", "BUN", "CREATININE", "CALCIUM" in the last 72 hours.   Intake/Output Summary (Last 24 hours) at 04/26/2022 0943 Last data filed at 04/26/2022 0738 Gross per 24 hour  Intake 347 ml  Output --  Net 347 ml        Physical Exam: Vital Signs Blood pressure (!) 148/76, pulse 84, temperature 98.3 F (36.8 C), temperature source Oral, resp. rate 18, SpO2 100 %.  Gen: no distress, normal appearing HEENT: oral mucosa pink and moist, NCAT Cardio: Reg rate Chest: normal effort, normal rate of breathing Abd: soft, non-distended Ext: no edema Psych: pleasant, normal affect Skin: intact Neurological: Alert,  aphasic      Comments: Patient is alert and comfortable. Expressive>receptive aphasia. Makes eye contact with examiner.  Difficulty following simple commands with extra time, repetition and cues. Would not cooperate consistently with CN exam. Word salad with occasional appropriate response to simple questions (<10%). Sentence structure improving. Moves all 4 limbs, senses pain. No abnl tone.   Psychiatric:     Comments: Seems generally up beat, engaging.     Assessment/Plan: 1. Functional deficits which require 3+ hours per day of interdisciplinary therapy in a comprehensive inpatient rehab setting. Physiatrist is providing close team supervision and 24 hour management of active medical problems listed below. Physiatrist and rehab team continue to assess barriers to discharge/monitor patient progress toward functional and medical goals  Care  Tool:  Bathing    Body parts bathed by patient: Right arm, Left arm, Abdomen, Chest, Front perineal area, Right upper leg, Left upper leg, Right lower leg, Left lower leg, Face, Buttocks   Body parts bathed by helper: Buttocks     Bathing assist Assist Level: Supervision/Verbal cueing     Upper Body Dressing/Undressing Upper body dressing   What is the patient wearing?: Bra, Pull over shirt    Upper body assist Assist Level: Supervision/Verbal cueing    Lower Body Dressing/Undressing Lower body dressing      What is the patient wearing?: Underwear/pull up, Pants     Lower body assist Assist for lower body dressing: Supervision/Verbal cueing     Toileting Toileting    Toileting assist Assist for toileting: Supervision/Verbal cueing     Transfers Chair/bed transfer  Transfers assist  Chair/bed transfer activity did not occur: Safety/medical concerns  Chair/bed transfer assist level: Supervision/Verbal cueing     Locomotion Ambulation   Ambulation assist      Assist level: Supervision/Verbal cueing Assistive device: Walker-rolling Max distance: 144f   Walk 10 feet activity   Assist     Assist level: Supervision/Verbal cueing Assistive device: Walker-rolling   Walk 50 feet activity   Assist    Assist level: Supervision/Verbal cueing Assistive device: Walker-rolling    Walk 150 feet activity   Assist    Assist level: Supervision/Verbal cueing Assistive device: Walker-rolling  Walk 10 feet on uneven surface  activity   Assist     Assist level: Contact Guard/Touching assist Assistive device: Walker-rolling   Wheelchair     Assist Is the patient using a wheelchair?: No   Wheelchair activity did not occur: N/A         Wheelchair 50 feet with 2 turns activity    Assist    Wheelchair 50 feet with 2 turns activity did not occur: N/A       Wheelchair 150 feet activity     Assist  Wheelchair 150 feet  activity did not occur: N/A       Blood pressure (!) 148/76, pulse 84, temperature 98.3 F (36.8 C), temperature source Oral, resp. rate 18, SpO2 100 %.  Medical Problem List and Plan: 1. 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   -WILL NEED FORMAL CATHETER ANGIOGRAM BEFORE DISCHARGE FROM CIR PER DR [id number], will discuss with NSGY that it is unlikely that patient's cognition will improve prior to discharge, will touch base again today given that discharge date is in 2 days.              -patient may shower             -ELOS/Goals: 10 days, supervision goals with PT, OT, mod assist with language  D/c Friday to ALF  2.  Antithrombotics: -DVT/anticoagulation:  Pharmaceutical: Heparin             -antiplatelet therapy: N/A 3. Pain Management: NA, d/c oxycodone 4. Insomnia: continue Melatonin 3 mg nightly. Discontinue Seroquel             -monitor sleep habits 5. Neuropsych/cognition: This patient is not capable of making decisions on her own behalf. 6. Scalp sutures: removed 7. Fluids/Electrolytes/Nutrition: Routine in and outs follow-up chemistries 8.  Hyperlipidemia.  LDL reviewed and is 119, continue Crestor 9.  COPD with tobacco use.  Continue inhalers as directed.  Provide counts regards to cessation of nicotine products. Incentive spirometer ordered 10.  Hypotension: restart Norvasc 2.'5mg'$  daily.  Monitor with increased mobility 11.  Urinary retention.  Decrease Urecholine to 5 mg BID.  Check PVR' and emptying patterns.   Has been continent  12.  Hyperlipidemia.  Crestor 13.  Leukocytosis.  Likely 2/2 to [place] as per ID, completed antibiotics.  14.  Dysphagia.  upgraded to regular diet 15. Suboptimal vitamin D: continue ergocalciferol 50,000U once per week for 7 weeks 16. Hypoalbuminemia: advised to choose high protein foods 17. Fall: CT head ordered and reviewed; shows improvements in size of  bleed 18. Intermittent fevers: will consult ID, likely secondary to the [place] as per IF, will obtain MRI w/ contrast to r/o abscess  9/2- MRI shows no abscess-, discussed with sister.     LOS: 10 days A FACE TO FACE EVALUATION WAS PERFORMED  [name] Giles [name] Giles 04/26/2022, 9:43 AM

## 2022-04-26 NOTE — Progress Notes (Signed)
Occupational Therapy Session Note  Patient Details  Name: [name] Giles MRN: [id number] Date of Birth: 1955/09/18  Today's Date: 04/26/2022 OT Individual Time: 6712-4580 OT Individual Time Calculation (min): 60 min    Short Term Goals: Week 2:   STG = LTG d/t ELOS  Skilled Therapeutic Interventions/Progress Updates:  Pt awake in bed upon OT arrival to the room. Pt reports, "Hi. How are you?" Pt in agreement for OT session.  Therapy Documentation Precautions:  Precautions Precautions: Fall Precaution Comments: expressive > receptive aphasia Restrictions Weight Bearing Restrictions: Yes Vital Signs: Please see "Flowsheet" for most recent vitals charted by nursing staff.  Pain: Pain Assessment Pain Scale: 0-10 Pain Score: 1  Pain Location: Neck Pain Orientation: Mid Pain Descriptors / Indicators: Aching Pain Onset: On-going Pain Intervention(s): Emotional support;Distraction Multiple Pain Sites: No  ADL: Pt requires increased time to initiate functional mobility and initiate session secondary to pt report of "I just want to lay here and watch TV," pt requires increased time for transition and initiation of tasks.   Grooming: Supervision/safety (Pt able to complete hand hygiene standing at the sink with close supervision with minimal VCs to sequence and locate soap.) Where Assessed-Grooming: Standing at sink Toileting: Supervision/safety (Pt able to perform 3/3 toileting tasks with distant supervision for safety with use of FWW.) Where Assessed-Toileting: Glass blower/designer: Distant supervision (Pt able to perform ambulatory toilet transfer from EOB > toilet > sink > EOB with distant supervision with use of FWW for safety.) Toilet Transfer Method: Ambulating Toilet Transfer Equipment:  (None) ADL Comments: Pt requires minimal VCs for sequencing and locate items during hand hygiene and toileting ADLs. Pt demo's no LOB and able to complete tasks with close - distant  supervision. Pt requires slightly increased time for initiation due to fatigue. Pt able to doff and don socks and shoes with set-up assist at EOB.  Cognition Testing to Prep for DC: Pt requires increased time to complete cognitive testing secondary to aphasia and requiring increased time to generate a verbal response.    04/26/22 1006  Cognition  Overall Cognitive Status Impaired/Different from baseline  Arousal/Alertness Awake/alert  Memory Impaired  Memory Impairment Decreased recall of new information;Decreased short term memory;Retrieval deficit  Decreased Short Term Memory Verbal basic;Functional basic  Attention Focused;Sustained;Selective  Focused Attention Appears intact  Focused Attention Impairment Verbal basic;Verbal complex  Sustained Attention Impaired  Sustained Attention Impairment Verbal basic;Functional basic  Selective Attention Impaired  Selective Attention Impairment Verbal basic;Functional basic  Awareness Impaired  Awareness Impairment Emergent impairment;Anticipatory impairment  Problem Solving Impaired  Problem Solving Impairment Verbal basic;Functional basic  Safety/Judgment Impaired  Brief Interview for Mental Status (BIMS)  Repetition of Three Words (First Attempt) 3  Temporal Orientation: Year Correct  Temporal Orientation: Month Missed by more than 1 month  Temporal Orientation: Day Correct  Recall: "Sock" No, could not recall  Recall: "Blue" No, could not recall  Recall: "Bed" No, could not recall  BIMS Summary Score 7    Functional Mobility: Pt participates in functional mobility in order to improve ambulation needed for safe DC. Pt able to perform sit <> stand from various surfaces and ambulate from room <> day room (>100' x 2) with close supervision with use of FWW and no LOB noted. Pt requires moderate VC's for proper navigation within a familiar environment (VC's requires to navigate to day room, however, pt able to recall route to get back to  room) secondary to deficits with recall, sequencing, and problem solving. Pt  able to perform all bed mobility with modified independence with use of bed rails.   Visual Perceptual & Problem Solving: Pt participates in therapeutic activity in order to improve visual scanning, problem solving, and more complex command following needed to complete ADLs/mobility with improved independence. Pt able to copy a simple design on peg board with small pegs independently with no repeat of instructions. Pt able to manipulate small pegs independently and complete task with slightly increased time while standing at table-top with BUE on table for support with close supervision for safety. Pt demo's no LOB and reports minimal fatigue with mobility and standing balance   Pt returned to bed at end of session. Pt left resting comfortably in bed with personal belongings and call light within reach, bed alarm on and activated, bed in low position, 3 bed rails up, and comfort needs attended to.   Therapy/Group: Individual Therapy  Barbee Shropshire 04/26/2022, 10:53 AM

## 2022-04-26 NOTE — Progress Notes (Signed)
Inpatient Rehabilitation Discharge Medication Review by a Pharmacist  A complete drug regimen review was completed for this patient to identify any potential clinically significant medication issues.  High Risk Drug Classes Is patient taking? Indication by Medication  Antipsychotic No   Anticoagulant No   Antibiotic No   Opioid No   Antiplatelet No   Hypoglycemics/insulin No   Vasoactive Medication Yes Amlodipine - BP   Chemotherapy No   Other Yes Bethanechol - urinary retention Symicort inhaler - COPD Rosuvastatin - HLD Vitamin D-  Vitamin D deficiency     Type of Medication Issue Identified Description of Issue Recommendation(s)  Drug Interaction(s) (clinically significant)     Duplicate Therapy     Allergy     No Medication Administration End Date     Incorrect Dose     Additional Drug Therapy Needed     Significant med changes from prior encounter (inform family/care partners about these prior to discharge).    Other       Clinically significant medication issues were identified that warrant physician communication and completion of prescribed/recommended actions by midnight of the next day:  No  Pharmacist comments: None  Time spent performing this drug regimen review (minutes):  30 minutes  Thank you Nicole Cella, [place] Pharmacist

## 2022-04-26 NOTE — Progress Notes (Signed)
Speech Language Pathology Discharge Summary  Patient Details  Name: [name] Giles MRN: [id number] Date of Birth: 11-03-1955  Date of Discharge from SLP service:April 26, 2022  Today's Date: 04/26/2022 SLP Individual Time: 1115-1200 SLP Individual Time Calculation (min): 45 min   Skilled Therapeutic Interventions:   S: Pt seen this date for skilled ST intervention targeting swallowing and communication goals outlined above. Pt received lying in bed with eyes closed; aroused to name. No c/o pain. Agreeable to ST intervention at bedside. Intermittently tearful during today's session. Increased jargon, neologisms, and 'word salad' during today's session, particularly within unstructured conversational context. Responses appeared more coherent during structured conversation when close-ended questions were asked.  O: SLP facilitated today's session by providing post-test assessment following CIR intervention. Pt achieved the following scores on the "MS Aphasia Screening Test:"  Expressive Index Naming: 0/10 Automatic Speech: 9/10 Repetition: 8/10  Receptive Index Yes/No Accuracy: 14/20 Following Instructions: 4/10 Reading: Impaired at the word level  While pt has exhibited subtle improvement in her verbal expression and auditory comprehension, pt continues to present with at least a moderate-severe vs severe fluent aphasia. Re: deglutition, no s/sx concerning for aspiration with intake of soft solid snack and thin liquid via cup.  A: Plan is for pt to d/c to ALF next date given decreased caregiver support at home. Please see below for d/c summary.  P: Pt left in bed with all safety measures activated. Call bell reviewed and within reach and all immediate needs met.    Patient has met 6 of 6 long term goals.  Patient to discharge at overall Mod;Min;Max (Independent for swallowing; Min A for basic auditory comprehension and Mod-Max A for verbal expression; Mod A for expression of wants  and needs via multimodal communication) level.  Reasons goals not met: N/A   Clinical Impression/Discharge Summary:    Pt has demonstrated functional gains as evident by meeting 6 out of 6 long-term goals set at time of initial evaluation. Pt education limited due to severity of language impairment; however, family education completed. Pt continues to present with a moderate-severe fluent aphasia that prevents her from being safe in an independent living environment. Additionally, cognitive impairments appear present, though difficult to fully assess given the nature of pt's linguistic deficits. Re: swallowing, pt tolerating a regular diet with thin liquids via cup (no straws) with adherence to general aspiration precautions (small + single bites/sips, slow rate, upright positioning, only eating when fully awake/alert); however, pt's intake has been poor this admission. Recommend 24/7 supervision and assistance at time of discharge, as well as f/u ST intervention at next venue of care. Pt with limited understanding of recommendations; however, family has verbalized understanding and are aware of recommendations.  Care Partner:  Caregiver Able to Provide Assistance: Yes  Type of Caregiver Assistance: Physical;Cognitive  Recommendation:  24 hour supervision/assistance;Home Health SLP;Outpatient SLP;Other (comment) (ALF)  Rationale for SLP Follow Up: Maximize functional communication;Reduce caregiver burden   Equipment: N/A for ST  Reasons for discharge: Discharged from hospital   Patient/Family Agrees with Progress Made and Goals Achieved: Yes    [name] Giles 04/26/2022, 1:04 PM

## 2022-04-26 NOTE — Plan of Care (Signed)

## 2022-04-26 NOTE — Progress Notes (Addendum)
Patient ID: [name] Giles, female   DOB: [birth year], 66 y.o.   MRN: [id number]  Have ordered home nebulizer machine to use at the ALF. Pt is aware of the plan for tomorrow and somewhat anxious regarding going to a new facility and having to get used to new staff.   1:10 PM [facility] has accepted the referral for PT, OT and SP follow up at the facility. [employer] to deliver rolling walker and nebulizer machine to take with her tomorrow.

## 2022-04-26 NOTE — Plan of Care (Signed)
  Problem: RH Swallowing Goal: LTG Patient will consume least restrictive diet using compensatory strategies with assistance (SLP) Description: LTG:  Patient will consume least restrictive diet using compensatory strategies with assistance (SLP) Outcome: Completed/Met Goal: LTG Patient will participate in dysphagia therapy to increase swallow function with assistance (SLP) Description: LTG:  Patient will participate in dysphagia therapy to increase swallow function with assistance (SLP) Outcome: Completed/Met Goal: LTG Pt will demonstrate functional change in swallow as evidenced by bedside/clinical objective assessment (SLP) Description: LTG: Patient will demonstrate functional change in swallow as evidenced by bedside/clinical objective assessment (SLP) Outcome: Completed/Met   Problem: RH Expression Communication Goal: LTG Patient will express needs/wants via multi-modal(SLP) Description: LTG:  Patient will express needs/wants via multi-modal communication (gestures/written, etc) with cues (SLP) Outcome: Completed/Met Goal: LTG Patient will verbally express basic/complex needs(SLP) Description: LTG:  Patient will verbally express basic/complex needs, wants or ideas with cues  (SLP) Outcome: Completed/Met   Problem: RH Attention Goal: LTG Patient will demonstrate this level of attention during functional activites (SLP) Description: LTG:  Patient will will demonstrate this level of attention during functional activites (SLP) Outcome: Completed/Met

## 2022-04-27 NOTE — Progress Notes (Signed)
Inpatient Rehabilitation Care Coordinator Discharge Note   Patient Details  Name: [name] Giles MRN: [id number] Date of Birth: 10-12-1955   Discharge location: GOING TO [facility]  Length of Stay: 11 DAYS  Discharge activity level: SUPERVISION LEVEL  Home/community participation: ACTIVE  Patient response EX:BMWUXL Literacy - How often do you need to have someone help you when you read instructions, pamphlets, or other written material from your doctor or pharmacy?: Never  Patient response KG:MWNUUV Isolation - How often do you feel lonely or isolated from those around you?: Patient unable to respond  Services provided included: MD, RD, PT, OT, SLP, RN, CM, TR, Pharmacy, SW  Financial Services:  Financial Services Utilized: [place]  Choices offered to/list presented to: SISTER  Follow-up services arranged:  Other (Comment) (ALF) [facility] TO PROVIDE HHPT OT SP FOLLOW. NO PREFERENCE BY PT OR SISTER           Patient response to transportation need: Is the patient able to respond to transportation needs?: Yes In the past 12 months, has lack of transportation kept you from medical appointments or from getting medications?: No In the past 12 months, has lack of transportation kept you from meetings, work, or from getting things needed for daily living?: No    Comments (or additional information):SISTER AND SON DECIDED BEST OPTION IS ALF FOR ONE MONTH WHILE SHE IS OUT OF THE COUNTRY. PLAN TO TAKE HOME AFTER MONTH WITH 24/7 CARE.   Patient/Family verbalized understanding of follow-up arrangements:  Yes  Individual responsible for coordination of the follow-up plan: KATHY-SISTER/POA [phone]  Confirmed correct DME delivered: Elease Hashimoto 04/27/2022    Zakhia Seres, Gard[place] Rhyme

## 2022-04-27 NOTE — Progress Notes (Signed)
PROGRESS NOTE   Subjective/Complaints: No new questions this morning Her sister is coming to pick her up soon [place] med review She is alert and smiling  ROS:  Limited by aphasia/cognition  Objective:   No results found. No results for input(s): "WBC", "HGB", "HCT", "PLT" in the last 72 hours.  No results for input(s): "NA", "K", "CL", "CO2", "GLUCOSE", "BUN", "CREATININE", "CALCIUM" in the last 72 hours.   Intake/Output Summary (Last 24 hours) at 04/27/2022 0855 Last data filed at 04/27/2022 0744 Gross per 24 hour  Intake 575 ml  Output 0 ml  Net 575 ml        Physical Exam: Vital Signs Blood pressure 106/60, pulse 89, temperature 98.3 F (36.8 C), temperature source Oral, resp. rate 17, SpO2 100 %.  Gen: no distress, normal appearing HEENT: oral mucosa pink and moist, NCAT Cardio: Reg rate Chest: normal effort, normal rate of breathing Abd: soft, non-distended Ext: no edema Psych: pleasant, normal affect Skin: intact, sutures removed and scalp is healing well Neurological: Alert,  aphasic      Comments: Patient is alert and comfortable. Expressive>receptive aphasia. Makes eye contact with examiner.  Difficulty following simple commands with extra time, repetition and cues. Would not cooperate consistently with CN exam. Word salad with occasional appropriate response to simple questions (<10%). Sentence structure improving. Moves all 4 limbs, senses pain. No abnl tone.   Psychiatric:     Comments: Seems generally up beat, engaging.     Assessment/Plan: 1. Functional deficits which require 3+ hours per day of interdisciplinary therapy in a comprehensive inpatient rehab setting. Physiatrist is providing close team supervision and 24 hour management of active medical problems listed below. Physiatrist and rehab team continue to assess barriers to discharge/monitor patient progress toward functional  and medical goals  Care Tool:  Bathing    Body parts bathed by patient: Right arm, Left arm, Abdomen, Chest, Front perineal area, Right upper leg, Left upper leg, Right lower leg, Left lower leg, Face, Buttocks   Body parts bathed by helper: Buttocks     Bathing assist Assist Level: Supervision/Verbal cueing     Upper Body Dressing/Undressing Upper body dressing   What is the patient wearing?: Bra, Pull over shirt    Upper body assist Assist Level: Supervision/Verbal cueing    Lower Body Dressing/Undressing Lower body dressing      What is the patient wearing?: Underwear/pull up, Pants     Lower body assist Assist for lower body dressing: Supervision/Verbal cueing     Toileting Toileting    Toileting assist Assist for toileting: Supervision/Verbal cueing     Transfers Chair/bed transfer  Transfers assist  Chair/bed transfer activity did not occur: Safety/medical concerns  Chair/bed transfer assist level: Supervision/Verbal cueing     Locomotion Ambulation   Ambulation assist      Assist level: Supervision/Verbal cueing Assistive device: Walker-rolling Max distance: 113f   Walk 10 feet activity   Assist     Assist level: Supervision/Verbal cueing Assistive device: Walker-rolling   Walk 50 feet activity   Assist    Assist level: Supervision/Verbal cueing Assistive device: Walker-rolling    Walk 150 feet activity   Assist  Assist level: Supervision/Verbal cueing Assistive device: Walker-rolling    Walk 10 feet on uneven surface  activity   Assist     Assist level: Contact Guard/Touching assist Assistive device: Walker-rolling   Wheelchair     Assist Is the patient using a wheelchair?: No   Wheelchair activity did not occur: N/A         Wheelchair 50 feet with 2 turns activity    Assist    Wheelchair 50 feet with 2 turns activity did not occur: N/A       Wheelchair 150 feet activity     Assist   Wheelchair 150 feet activity did not occur: N/A       Blood pressure 106/60, pulse 89, temperature 98.3 F (36.8 C), temperature source Oral, resp. rate 17, SpO2 100 %.  Medical Problem List and Plan: 1. 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   -WILL NEED FORMAL CATHETER ANGIOGRAM BEFORE DISCHARGE FROM CIR PER DR [id number], will discuss with NSGY that it is unlikely that patient's cognition will improve prior to discharge, will touch base again today given that discharge date is in 2 days.              -patient may shower             -ELOS/Goals: 10 days, supervision goals with PT, OT, mod assist with language  D/c Friday to ALF   Would benefit from TC/HFU 2.  Antithrombotics: -DVT/anticoagulation:  Pharmaceutical: Heparin             -antiplatelet therapy: N/A 3. Pain Management: NA, d/c oxycodone 4. Insomnia: continue Melatonin 3 mg nightly. Discontinue Seroquel             -monitor sleep habits 5. Neuropsych/cognition: This patient is not capable of making decisions on her own behalf. 6. Scalp sutures: removed, site assessed and healing well 7. Fluids/Electrolytes/Nutrition: Routine in and outs follow-up chemistries 8.  Hyperlipidemia.  LDL reviewed and is 119, continue Crestor 9.  COPD with tobacco use.  Continue inhalers as directed.  Provide counts regards to cessation of nicotine products. Incentive spirometer ordered 10.  Hypotension: restart Norvasc 2.'5mg'$  daily.  Monitor with increased mobility 11.  Urinary retention.  Decrease Urecholine to 5 mg BID.  Check PVR' and emptying patterns.   Has been continent  12.  Hyperlipidemia.  Crestor 13.  Leukocytosis.  Likely 2/2 to [place] as per ID, completed antibiotics.  14.  Dysphagia.  upgraded to regular diet 15. Suboptimal vitamin D: continue ergocalciferol 50,000U once per week for 7 weeks 16. Hypoalbuminemia: advised to choose high protein foods 17.  Fall: CT head ordered and reviewed; shows improvements in size of bleed 18. Intermittent fevers: will consult ID, likely secondary to the [place] as per IF, will obtain MRI w/ contrast to r/o abscess  9/2- MRI shows no abscess-, discussed with sister.     LOS: 11 days A FACE TO FACE EVALUATION WAS PERFORMED  [name] Giles 04/27/2022, 8:55 AM

## 2022-04-27 NOTE — Progress Notes (Signed)
Occupational Therapy Discharge Summary  Patient Details  Name: [name] Giles MRN: [id number] Date of Birth: 13-Oct-1955  Date of Discharge from OT service:April 26, 2022   Patient has met 5 of 5 long term goals due to improved activity tolerance, improved balance, postural control, ability to compensate for deficits, improved attention, and improved awareness.  Patient to discharge at overall Supervision level.  Patient's care partner unavailable to provide the necessary cognitive assistance at discharge.    Reasons goals not met: NA  Recommendation:  Patient will benefit from ongoing skilled OT services in home health setting to continue to advance functional skills in the area of BADL and iADL.  Equipment: No equipment provided  Reasons for discharge: treatment goals met and discharge from hospital  Patient/family agrees with progress made and goals achieved: Yes - D/C to ALF  OT Discharge Precautions/Restrictions  Precautions Precautions: Fall Precaution Comments: expressive > receptive aphasia Restrictions Weight Bearing Restrictions: No   Pain  No pain ADL ADL Eating: Supervision/safety Where Assessed-Eating: Chair Grooming: Supervision/safety Where Assessed-Grooming: Standing at sink Upper Body Bathing: Supervision/safety, Moderate cueing Where Assessed-Upper Body Bathing: Shower Lower Body Bathing: Supervision/safety Where Assessed-Lower Body Bathing: Shower Upper Body Dressing: Setup Where Assessed-Upper Body Dressing: Chair Lower Body Dressing: Supervision/safety Where Assessed-Lower Body Dressing: Chair Toileting: Supervision/safety Where Assessed-Toileting: Glass blower/designer: Distant supervision Armed forces technical officer Method: Counselling psychologist:  (None) Tub/Shower Transfer: Close supervison Clinical cytogeneticist Method: Optometrist: Facilities manager: Close supervision Financial planner Method: Heritage manager: Civil engineer, contracting with back ADL Comments: Pt requires minimal VCs for sequencing and locate items during hand hygiene and toileting ADLs. Pt demo's no LOB and able to complete tasks with close - distant supervision. Pt requires slightly increased time for initiation due to fatigue. Vision Baseline Vision/History: 1 Wears glasses Patient Visual Report: No change from baseline Vision Assessment?: No apparent visual deficits Perception  Perception: Within Functional Limits Praxis Praxis: Intact Cognition Cognition Overall Cognitive Status: Impaired/Different from baseline Arousal/Alertness: Awake/alert Memory: Impaired Memory Impairment: Decreased recall of new information;Decreased short term memory;Retrieval deficit Decreased Short Term Memory: Functional complex;Verbal basic Attention: Selective Focused Attention: Appears intact Focused Attention Impairment: Functional basic Sustained Attention: Appears intact Sustained Attention Impairment: Functional basic Selective Attention: Impaired Selective Attention Impairment: Functional basic;Functional complex Awareness: Impaired Awareness Impairment: Emergent impairment Problem Solving: Impaired Problem Solving Impairment: Functional basic Behaviors: Restless Safety/Judgment: Impaired Brief Interview for Mental Status (BIMS) Repetition of Three Words (First Attempt): 3 Temporal Orientation: Year: Correct Temporal Orientation: Month: Missed by more than 1 month Temporal Orientation: Day: Correct Recall: "Sock": No, could not recall Recall: "Blue": No, could not recall Recall: "Bed": No, could not recall BIMS Summary Score: 7 Sensation Sensation Light Touch: Appears Intact Hot/Cold: Appears Intact Proprioception: Appears Intact Stereognosis: Not tested Coordination Gross Motor Movements are Fluid and Coordinated: No Fine Motor Movements are Fluid and Coordinated: Yes Motor   Motor Motor: Motor impersistence Mobility  Bed Mobility Bed Mobility: Rolling Right;Rolling Left;Sit to Supine;Supine to Sit Rolling Right: Independent Rolling Left: Independent Supine to Sit: Supervision/Verbal cueing Sit to Supine: Supervision/Verbal cueing Transfers Sit to Stand: Supervision/Verbal cueing Stand to Sit: Supervision/Verbal cueing  Trunk/Postural Assessment  Cervical Assessment Cervical Assessment: Within Functional Limits Thoracic Assessment Thoracic Assessment: Exceptions to [facility] (thoracic flex) Lumbar Assessment Lumbar Assessment: Exceptions to [facility] Postural Control Righting Reactions: mild delay Protective Responses: mild delay  Balance Balance Balance Assessed: Yes Static Sitting Balance Static Sitting - Balance Support: Feet supported;No  upper extremity supported Static Sitting - Level of Assistance: 7: Independent Dynamic Sitting Balance Dynamic Sitting - Balance Support: Feet supported;No upper extremity supported Dynamic Sitting - Level of Assistance: 7: Independent Sitting balance - Comments: good sitting balance on various surfaces while completing ADLs, no LOB note din sitting Static Standing Balance Static Standing - Balance Support: No upper extremity supported;During functional activity Static Standing - Level of Assistance: 5: Stand by assistance Dynamic Standing Balance Dynamic Standing - Balance Support: During functional activity;No upper extremity supported Dynamic Standing - Level of Assistance: 5: Stand by assistance Dynamic Standing - Balance Activities: Lateral lean/weight shifting;Forward lean/weight shifting;Reaching for objects Extremity/Trunk Assessment RUE Assessment RUE Assessment: Within Functional Limits LUE Assessment LUE Assessment: Within Functional Limits   [name] 04/27/2022, 2:09 PM

## 2022-05-01 ENCOUNTER — Other Ambulatory Visit: Payer: Self-pay | Admitting: Physical Medicine and Rehabilitation

## 2022-05-08 ENCOUNTER — Other Ambulatory Visit: Payer: Self-pay | Admitting: Physical Medicine and Rehabilitation

## 2022-05-15 ENCOUNTER — Other Ambulatory Visit: Payer: Self-pay | Admitting: Physical Medicine and Rehabilitation

## 2022-05-17 ENCOUNTER — Inpatient Hospital Stay: Payer: 59 | Admitting: Registered Nurse

## 2022-05-23 ENCOUNTER — Encounter: Payer: 59 | Attending: Registered Nurse | Admitting: Registered Nurse

## 2022-05-23 ENCOUNTER — Encounter: Payer: Self-pay | Admitting: Registered Nurse

## 2022-05-23 VITALS — BP 117/72 | HR 83 | Ht 65.0 in | Wt 140.4 lb

## 2022-05-23 DIAGNOSIS — I10 Essential (primary) hypertension: Secondary | ICD-10-CM | POA: Diagnosis not present

## 2022-05-23 DIAGNOSIS — E7849 Other hyperlipidemia: Secondary | ICD-10-CM

## 2022-05-23 NOTE — Progress Notes (Signed)
Subjective:    Patient ID: [name] Giles, female    DOB: 07-11-1956, 66 y.o.   MRN: [id number]  HPI: [name] Giles is a 66 y.o. female who is here for HFU appointment for follow up of her. ICH ( Intracerebral hemorrhage),  Essential Hypertension and Hyperlipidemia. She presented to [facility] on [facility]/2022 for altered mental status after being found down.   CT Head: WO Contrast: CT Cervical Spine   IMPRESSION: 1. At least 5 x 4.5 x 4 cm intraparenchymal hemorrhage centered within the left anterior temporal and frontal lobes. Associated intraventricular extension of the hemorrhage. Associated 5 mm left-to-right midline shift. Impending uncal herniation on the left. Underlying mass lesion not excluded. 2. No acute displaced fracture or traumatic listhesis of the cervical spine. 3.  Emphysema (ICD10-J43.9).  MRI Brain: WO Contrast:  IMPRESSION: 1. 68 mL intraparenchymal hemorrhage centered at the left temporal lobe, similar in size as compared to prior CT. Surrounding vasogenic edema with regional mass effect, with worsened 1 cm of left-to-right shift. 2. Irregular heterogeneous area of enhancement along the lateral margin of the hemorrhage, nonspecific, but raises the possibility for an underlying vascular lesion. Further evaluation with dedicated CTA and/or catheter directed angiogram recommended for further evaluation. Possible mass not excluded, although is felt to be less likely. 3. 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 Interval placement of a right frontal approach ventriculostomy with decompression of the right lateral ventricle. Ventriculomegaly involving the left lateral ventricle is relatively unchanged. 4. Few small remote bilateral cerebellar infarcts.    Ms. Chesmore underwent right frontal ventriculostomy via bur hole, by Dr Arnoldo Morale.   Ms. Eckmann was admitted to inpatient rehabilitation on 04/16/2022 and  discharged to [facility]  on 04/27/2022. She will be discharged to her home on 05/27/2022, her sister states.  She denies any pain at this time. She rated her pain 8 on Health and History. She also states she has a good appetite.  She is receiving Home Health Therapy with [facility].   Sister in room, all questions answered.      Pain Inventory Average Pain 8 Pain Right Now 8 My pain is constant  LOCATION OF PAIN  Head, Legs, Ankles, Feet  BOWEL Number of stools per week: 20  BLADDER Normal    Mobility walk without assistance walk with assistance use a walker how many minutes can you walk? 10-15 ability to climb steps?  yes do you drive?  no Do you have any goals in this area?  yes  Function disabled: date disabled .  Neuro/Psych numbness tingling trouble walking  Prior Studies Any changes since last visit?  no  Physicians involved in your care Any changes since last visit?  no   Family History  Adopted: Yes  Problem Relation Age of Onset   Breast cancer Mother    Hypertension Brother    Diabetes Brother    Hypertension Brother    Colon cancer Neg Hx    Social History   Socioeconomic History   Marital status: Unknown    Spouse name: Not on file   Number of children: Not on file   Years of education: Not on file   Highest education level: Not on file  Occupational History   Not on file  Tobacco Use   Smoking status: Every Day    Packs/day: 1.00    Years: 30.00    Total pack years: 30.00    Types: Cigarettes  Smokeless tobacco: Never  Substance and Sexual Activity   Alcohol use: Yes    Comment: wine occasionally   Drug use: No   Sexual activity: Not Currently  Other Topics Concern   Not on file  Social History Narrative   Not on file   Social Determinants of Health   Financial Resource Strain: Not on file  Food Insecurity: Not on file  Transportation Needs: Not on file  Physical Activity: Not on file  Stress:  Not on file  Social Connections: Not on file   Past Surgical History:  Procedure Laterality Date   COLONOSCOPY     COLONOSCOPY N/A 04/06/2013   Procedure: COLONOSCOPY;  Surgeon: Daneil Dolin, MD;  Location: [facility] SU[facility];  Service: Endoscopy;  Laterality: N/A;  9:30 AM   ENDOMETRIAL ABLATION     IR ANGIO INTRA EXTRACRAN SEL INTERNAL CAROTID BILAT MOD SED  04/04/2022   IR US GUIDE VASC ACCESS RIGHT  04/04/2022   Past Medical History:  Diagnosis Date   Asthma    Medical history non-contributory    Ht '5\' 5"'$  (1.651 m)   Wt 140 lb 6.4 oz (63.7 kg)   BMI 23.36 kg/m   Opioid Risk Score:   Fall Risk Score:  `1  Depression screen [facility] 2/9     05/23/2022    2:05 PM 04/04/2017   10:18 AM  Depression screen PHQ 2/9  Decreased Interest 1 0  Down, Depressed, Hopeless 0 0  PHQ - 2 Score 1 0  Altered sleeping 0   Tired, decreased energy 0   Change in appetite 0   Feeling bad or failure about yourself  0   Trouble concentrating 0   Moving slowly or fidgety/restless 1   Suicidal thoughts 0   PHQ-9 Score 2   Difficult doing work/chores Not difficult at all      Review of Systems  Musculoskeletal:  Positive for gait problem.       Head, Leg, Ankle, Foot pain  All other systems reviewed and are negative.     Objective:   Physical Exam Vitals and nursing note reviewed.  Constitutional:      Appearance: Normal appearance.  Cardiovascular:     Rate and Rhythm: Normal rate and regular rhythm.     Pulses: Normal pulses.     Heart sounds: Normal heart sounds.  Pulmonary:     Effort: Pulmonary effort is normal.     Breath sounds: Normal breath sounds.  Musculoskeletal:     Cervical back: Normal range of motion and neck supple.     Comments: Normal Muscle Bulk and Muscle Testing Reveals:  Upper Extremities: Full ROM and Muscle Strength 5/5 Lower Extremities: Full ROM and Muscle Strength 5/5 Arises from Table with Ease Narrow Based  Gait     Skin:    General: Skin is warm and  dry.  Neurological:     Mental Status: She is alert and oriented to person, place, and time.  Psychiatric:        Mood and Affect: Mood normal.        Behavior: Behavior normal.         Assessment & Plan:  ICH ( Intracerebral hemorrhage): S/P right frontal ventriculostomy via bur hole, by Dr Arnoldo Morale on 03/29/2022 Essential Hypertension: Continue current medication regimen: PCP will follow after discharged from [place].   Hyperlipidemia: Continue current medication regimen. PCP will follow after discharge from Assisted Living .   F/U with Dr Ranell Patrick in 4-  6 weeks .

## 2022-05-25 ENCOUNTER — Encounter: Payer: Self-pay | Admitting: Registered Nurse

## 2022-06-11 ENCOUNTER — Other Ambulatory Visit: Payer: Self-pay | Admitting: Neurosurgery

## 2022-06-11 DIAGNOSIS — I671 Cerebral aneurysm, nonruptured: Secondary | ICD-10-CM

## 2022-06-12 ENCOUNTER — Other Ambulatory Visit: Payer: Self-pay | Admitting: Neurosurgery

## 2022-07-03 ENCOUNTER — Other Ambulatory Visit: Payer: Self-pay | Admitting: Neurosurgery

## 2022-07-03 ENCOUNTER — Other Ambulatory Visit: Payer: Self-pay

## 2022-07-03 ENCOUNTER — Ambulatory Visit (HOSPITAL_COMMUNITY)
Admission: RE | Admit: 2022-07-03 | Discharge: 2022-07-03 | Disposition: A | Discharge status: Home / Self Care | Payer: 59 | Source: Ambulatory Visit | Attending: Neurosurgery | Admitting: Neurosurgery

## 2022-07-03 DIAGNOSIS — I671 Cerebral aneurysm, nonruptured: Secondary | ICD-10-CM

## 2022-07-03 DIAGNOSIS — F17210 Nicotine dependence, cigarettes, uncomplicated: Secondary | ICD-10-CM | POA: Insufficient documentation

## 2022-07-03 DIAGNOSIS — Q282 Arteriovenous malformation of cerebral vessels: Secondary | ICD-10-CM | POA: Diagnosis not present

## 2022-07-03 HISTORY — PX: IR ANGIO EXTERNAL CAROTID SEL EXT CAROTID UNI L MOD SED: IMG5370

## 2022-07-03 HISTORY — PX: IR ANGIO VERTEBRAL SEL VERTEBRAL UNI L MOD SED: IMG5367

## 2022-07-03 HISTORY — PX: IR ANGIO INTRA EXTRACRAN SEL INTERNAL CAROTID BILAT MOD SED: IMG5363

## 2022-07-03 LAB — URINALYSIS, ROUTINE W REFLEX MICROSCOPIC
Bilirubin Urine: NEGATIVE
Glucose, UA: NEGATIVE mg/dL
Hgb urine dipstick: NEGATIVE
Ketones, ur: NEGATIVE mg/dL
Leukocytes,Ua: NEGATIVE
Nitrite: NEGATIVE
Protein, ur: NEGATIVE mg/dL
Specific Gravity, Urine: 1.021 (ref 1.005–1.030)
pH: 5 (ref 5.0–8.0)

## 2022-07-03 LAB — BASIC METABOLIC PANEL
Anion gap: 14 (ref 5–15)
BUN: 9 mg/dL (ref 8–23)
CO2: 30 mmol/L (ref 22–32)
Calcium: 9.9 mg/dL (ref 8.9–10.3)
Chloride: 99 mmol/L (ref 98–111)
Creatinine, Ser: 0.76 mg/dL (ref 0.44–1.00)
GFR, Estimated: >60 mL/min (ref >60)
Glucose, Bld: 101 mg/dL — ABNORMAL HIGH (ref 70–99)
Potassium: 3.4 mmol/L — ABNORMAL LOW (ref 3.5–5.1)
Sodium: 143 mmol/L (ref 135–145)

## 2022-07-03 LAB — CBC WITH DIFFERENTIAL/PLATELET
Abs Immature Granulocytes: 0.01 10*3/uL (ref 0.00–0.07)
Basophils Absolute: 0.1 10*3/uL (ref 0.0–0.1)
Basophils Relative: 1 %
Eosinophils Absolute: 0.1 10*3/uL (ref 0.0–0.5)
Eosinophils Relative: 1 %
HCT: 40.7 % (ref 36.0–46.0)
Hemoglobin: 13.6 g/dL (ref 12.0–15.0)
Immature Granulocytes: 0 %
Lymphocytes Relative: 36 %
Lymphs Abs: 2.5 10*3/uL (ref 0.7–4.0)
MCH: 30.2 pg (ref 26.0–34.0)
MCHC: 33.4 g/dL (ref 30.0–36.0)
MCV: 90.4 fL (ref 80.0–100.0)
Monocytes Absolute: 0.5 10*3/uL (ref 0.1–1.0)
Monocytes Relative: 7 %
Neutro Abs: 3.8 10*3/uL (ref 1.7–7.7)
Neutrophils Relative %: 55 %
Platelets: 324 10*3/uL (ref 150–400)
RBC: 4.5 MIL/uL (ref 3.87–5.11)
RDW: 13 % (ref 11.5–15.5)
WBC: 6.9 10*3/uL (ref 4.0–10.5)
nRBC: 0 % (ref 0.0–0.2)

## 2022-07-03 LAB — APTT: aPTT: 30 seconds (ref 24–36)

## 2022-07-03 LAB — PROTIME-INR
INR: 1 (ref 0.8–1.2)
Prothrombin Time: 12.9 seconds (ref 11.4–15.2)

## 2022-07-03 MED ORDER — HEPARIN SODIUM (PORCINE) 1000 UNIT/ML IJ SOLN
INTRAMUSCULAR | Status: AC
  Filled 2022-07-03: qty 10

## 2022-07-03 MED ORDER — MIDAZOLAM HCL 2 MG/2ML IJ SOLN
INTRAMUSCULAR | Status: AC | PRN
  Administered 2022-07-03: 1 mg via INTRAVENOUS

## 2022-07-03 MED ORDER — CHLORHEXIDINE GLUCONATE CLOTH 2 % EX PADS: 6.0000 | MEDICATED_PAD | Freq: Once | CUTANEOUS | Status: DC

## 2022-07-03 MED ORDER — IOHEXOL 300 MG/ML  SOLN
100.0000 mL | Freq: Once | INTRAMUSCULAR | Status: AC | PRN
  Administered 2022-07-03: 40 mL via INTRA_ARTERIAL

## 2022-07-03 MED ORDER — LIDOCAINE HCL 1 % IJ SOLN
INTRAMUSCULAR | Status: AC
  Administered 2022-07-03: 8 mL
  Filled 2022-07-03: qty 20

## 2022-07-03 MED ORDER — FENTANYL CITRATE (PF) 100 MCG/2ML IJ SOLN
INTRAMUSCULAR | Status: AC | PRN
  Administered 2022-07-03 (×2): 25 ug via INTRAVENOUS

## 2022-07-03 MED ORDER — FENTANYL CITRATE (PF) 100 MCG/2ML IJ SOLN
INTRAMUSCULAR | Status: AC
  Filled 2022-07-03: qty 4

## 2022-07-03 MED ORDER — HYDROCODONE-ACETAMINOPHEN 5-325 MG PO TABS: 1.0000 | ORAL_TABLET | ORAL | Status: DC | PRN

## 2022-07-03 MED ORDER — HEPARIN SODIUM (PORCINE) 1000 UNIT/ML IJ SOLN
INTRAMUSCULAR | Status: AC | PRN
  Administered 2022-07-03: 2000 [IU] via INTRAVENOUS

## 2022-07-03 MED ORDER — SODIUM CHLORIDE 0.9 % IV SOLN: INTRAVENOUS | Status: DC

## 2022-07-03 MED ORDER — MIDAZOLAM HCL 2 MG/2ML IJ SOLN
INTRAMUSCULAR | Status: AC
  Filled 2022-07-03: qty 4

## 2022-07-03 MED ORDER — CEFAZOLIN SODIUM-DEXTROSE 2-4 GM/100ML-% IV SOLN: 2.0000 g | INTRAVENOUS | Status: DC

## 2022-07-03 NOTE — Progress Notes (Signed)
Patient and daughter was given discharge instructions. Both verbalized understanding. 

## 2022-07-03 NOTE — Brief Op Note (Signed)
  NEUROSURGERY BRIEF OPERATIVE  NOTE   PREOP DX: Left temporal hemorrhage  POSTOP DX: Left temporal AVM  PROCEDURE: Diagnostic cerebral angiogram  SURGEON: Dr. [name], MD  ANESTHESIA: IV Sedation with Local  APPROACH: Right trans-femoral  EBL: Minimal  SPECIMENS: None  COMPLICATIONS: None  CONDITION: Stable to recovery  FINDINGS (Full report in [facility]): 1. 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

## 2022-07-03 NOTE — H&P (Signed)
Chief Complaint   Vascular malformation  History of Present Illness  [name] Giles is a 66 y.o. female previously admitted to the hospital with sudden onset of headache and speech difficulty, with CT demonstrating a large left temporal hemorrhage.  CT angiogram suggested the possibility of underlying vascular malformation.  Initial attempt at diagnostic cerebral angiogram during her inpatient hospitalization was unsuccessful due to severe agitation.  After completing comprehensive inpatient rehab, the patient has been home for a few weeks.  She has made an excellent recovery and presents today for repeat diagnostic cerebral angiogram.  Past Medical History   Past Medical History:  Diagnosis Date   Asthma    Medical history non-contributory     Past Surgical History   Past Surgical History:  Procedure Laterality Date   COLONOSCOPY     COLONOSCOPY N/A 04/06/2013   Procedure: COLONOSCOPY;  Surgeon: Daneil Dolin, MD;  [facility]: [facility];  Service: Endoscopy;  Laterality: N/A;  9:30 AM   ENDOMETRIAL ABLATION     IR ANGIO INTRA EXTRACRAN SEL INTERNAL CAROTID BILAT MOD SED  04/04/2022   IR US GUIDE VASC ACCESS RIGHT  04/04/2022    Social History   Social History   Tobacco Use   Smoking status: Every Day    Packs/day: 1.00    Years: 30.00    Total pack years: 30.00    Types: Cigarettes   Smokeless tobacco: Never  Substance Use Topics   Alcohol use: Yes    Comment: wine occasionally   Drug use: No    Medications   Prior to Admission medications   Medication Sig Start Date End Date Taking? Authorizing Provider  acetaminophen (TYLENOL) 325 MG tablet Take 2 tablets (650 mg total) by mouth every 4 (four) hours as needed for mild pain or fever (temp >100.5). 04/16/22  Yes Pokhrel, Laxman, MD  albuterol (VENTOLIN HFA) 108 (90 Base) MCG/ACT inhaler Inhale 1-2 puffs into the lungs every 4 (four) hours as needed for shortness of breath. 04/26/22  Yes Angiulli, Lavon Paganini, PA-C   arformoterol (BROVANA) 15 MCG/2ML NEBU INHALE 1 VIAL VIA HH[facility] TWICE DAILY FOR COPD 05/03/22  Yes Raulkar, Clide Deutscher, MD  budesonide-formoterol (SYMBICORT) 160-4.5 MCG/ACT inhaler INHALE 2 PUFFS BY MOUTH EVERY DAY 05/15/22  Yes Raulkar, Clide Deutscher, MD  Ensure (ENSURE) DRINK 1-CAN (237ML) BY MOUTH TWICE DAILY FOR NUTRITION 05/03/22  Yes Raulkar, Clide Deutscher, MD  amLODipine (NORVASC) 2.5 MG tablet TAKE 1 TAB BY MOUTH EVERY DAY FOR HTN 06/11/22   Raulkar, Clide Deutscher, MD  bethanechol (URECHOLINE) 5 MG tablet Take 1 tablet (5 mg total) by mouth 2 (two) times daily. 04/26/22   Angiulli, Lavon Paganini, PA-C  melatonin 3 MG TABS tablet Take 1 tablet (3 mg total) by mouth at bedtime. 04/26/22   Angiulli, Lavon Paganini, PA-C  Multiple Vitamin (MULTIVITAMIN WITH MINERALS) TABS tablet Take 1 tablet by mouth daily. 04/17/22   Pokhrel, Corrie Mckusick, MD  rosuvastatin (CRESTOR) 20 MG tablet Take 1 tablet (20 mg total) by mouth daily. 04/26/22   Angiulli, Lavon Paganini, PA-C  senna-docusate (SENOKOT-S) 8.6-50 MG tablet Take 1 tablet by mouth 2 (two) times daily. 04/16/22   Pokhrel, Corrie Mckusick, MD  Vitamin D, Ergocalciferol, (DRISDOL) 1.25 MG (50000 UNIT) CAPS capsule Take 1 capsule (50,000 Units total) by mouth every 7 (seven) days. 05/02/22   Angiulli, Lavon Paganini, PA-C    Allergies  No Known Allergies  Review of Systems  ROS  Neurologic Exam  Awake, alert, oriented Memory and concentration  grossly intact Speech fluent, appropriate CN grossly intact Motor exam: Upper Extremities Deltoid Bicep Tricep Grip  Right 5/5 5/5 5/5 5/5  Left 5/5 5/5 5/5 5/5   Lower Extremities IP Quad PF DF EHL  Right 5/5 5/5 5/5 5/5 5/5  Left 5/5 5/5 5/5 5/5 5/5   Sensation grossly intact to LT  Impression  - 66 y.o. female with previous left temporal hemorrhage and CT angiogram suggesting presence of an underlying vascular malformation  Plan  -We will plan on proceeding with diagnostic cerebral angiogram  I have reviewed the indications for the procedure as  well as the details of the procedure and the expected postoperative course and recovery at length with the patient in the office. We have also reviewed in detail the risks, benefits, and alternatives to the procedure. All questions were answered and [name] Giles provided informed consent to proceed.  [name], MD [facility]

## 2022-07-17 ENCOUNTER — Other Ambulatory Visit: Payer: Self-pay | Admitting: Neurosurgery

## 2022-07-18 ENCOUNTER — Other Ambulatory Visit (HOSPITAL_COMMUNITY): Payer: Self-pay | Admitting: Neurosurgery

## 2022-07-18 DIAGNOSIS — Q282 Arteriovenous malformation of cerebral vessels: Secondary | ICD-10-CM

## 2022-07-19 ENCOUNTER — Encounter: Payer: 59 | Attending: Registered Nurse | Admitting: Physical Medicine and Rehabilitation

## 2022-07-19 VITALS — BP 121/76 | HR 88 | Ht 65.0 in | Wt 138.0 lb

## 2022-07-19 DIAGNOSIS — I10 Essential (primary) hypertension: Secondary | ICD-10-CM | POA: Diagnosis not present

## 2022-07-19 DIAGNOSIS — E7800 Pure hypercholesterolemia, unspecified: Secondary | ICD-10-CM | POA: Insufficient documentation

## 2022-07-19 NOTE — Patient Instructions (Signed)
HTN: ?-Advised checking BP daily at home and logging results to bring into follow-up appointment with PCP and myself. ?-Reviewed BP meds today.  ?-Advised regarding healthy foods that can help lower blood pressure and provided with a list: ?1) citrus foods- high in vitamins and minerals ?2) salmon and other fatty fish - reduces inflammation and oxylipins ?3) swiss chard (leafy green)- high level of nitrates ?4) pumpkin seeds- one of the best natural sources of magnesium ?5) Beans and lentils- high in fiber, magnesium, and potassium ?6) Berries- high in flavonoids ?7) Amaranth (whole grain, can be cooked similarly to rice and oats)- high in magnesium and fiber ?8) Pistachios- even more effective at reducing BP than other nuts ?9) Carrots- high in phenolic compounds that relax blood vessels and reduce inflammation ?10) Celery- contain phthalides that relax tissues of arterial walls ?11) Tomatoes- can also improve cholesterol and reduce risk of heart disease ?12) Broccoli- good source of magnesium, calcium, and potassium ?13) Greek yogurt: high in potassium and calcium ?14) Herbs and spices: Celery seed, cilantro, saffron, lemongrass, black cumin, ginseng, cinnamon, cardamom, sweet basil, and ginger ?15) Chia and flax seeds- also help to lower cholesterol and blood sugar ?16) Beets- high levels of nitrates that relax blood vessels  ?17) spinach and bananas- high in potassium ? ?-Provided lise of supplements that can help with hypertension:  ?1) magnesium: one high quality brand is Bioptemizers since it contains all 7 types of magnesium, otherwise over the counter magnesium gluconate 400mg is a good option ?2) B vitamins ?3) vitamin D ?4) potassium ?5) CoQ10 ?6) L-arginine ?7) Vitamin C ?8) Beetroot ?-Educated that goal BP is 120/80. ?-Made goal to incorporate some of the above foods into diet.   ? ?Provided with list of supplements that can help with dyslipidemia: ?1) Vitamin B3 500-4,000mg in divided doses daily  (would recommend starting low as can cause uncomfortable facial flushing if started at too high a dose) ?2) Phytosterols 2.15 grams daily ?3) Fermented soy 30-50 grams daily ?4) EGCG (found in green tea): 500-1000mg daily ?5) Omega-3 fatty acids 3000-5,000mg daily ?6) Flax seed 40 grams daily ?7) Monounsaturated fats 20-40 grams daily (olives, olive oil, nuts), also reduces cardiovascular disease ?8) Sesame: 40 grams daily ?9) Gamma/delta tocotrienols- a family of unsaturated forms of Vitamin E- 200mg with dinner ?10) Pantethine 900mg daily in divided doses ?11) Resveratrol 250mg daily ?12) N Acetyl Cysteine 2000mg daily in divided doses ?13) Curcumin 2000-5000mg in divided doses daily ?14) Pomegranate juice: 8 ounces daily, also helps to lower blood pressure ?15) Pomegranate seeds one cup daily, also helps to lower blood pressure ?16) Citrus Bergamot 1000mg daily, also helps with glucose control and weight loss ?17) Vitamin C 500mg daily ?18) Quercetin 500-1000mg daily ?19) Glutathione ?20) Probiotics 60-100 billion organisms per day ?21) Fiber ?22) Oats ?23) Aged garlic (can eat as food or supplement of 600-900mg per day) ?24) Chia seeds 25 grams per day ?25) Lycopene- carotenoid found in high concentrations in tomatoes. ?26) Alpha linolenic acid ?27) Flavonoids and anthocyanins ?28) Wogonin- flavanoid that enhances reverse cholesterol transport ?29) Coenzyme Q10 ?30) Pantethine- derivative of Vitamin B5: 300mg three times per day or 450mg twice per day with or without food ?31) Barley and other whole grains ?32) Orange juice ?33) L- carnitine ?34) L- Lysine ?35) L- Arginine ?36) Almonds ?37) Morin ?38) Rutin ?39) Carnosine ?40) Histidine  ?41) Kaempferol  ?42) Organosulfur compounds ?43) Vitamin E ?44) Oleic acid ?45) RBO (ferulic acid gammaoryzanol) ?46) grape seed   extract ?47) Red wine ?48) Berberine HCL 500mg daily or twice per day- more effective and with fewer adverse effects that ezetimibe monotherapy ?49)  red yeast rice 2400- 4800 mg/day ?50) chlorella ?51) Licorice  ?

## 2022-07-19 NOTE — Addendum Note (Signed)
Addended by: Izora Ribas on: 07/19/2022 11:38 AM   Modules accepted: Orders

## 2022-07-19 NOTE — Progress Notes (Addendum)
Subjective:    Patient ID: [name] Giles, female    DOB: 1956/04/08, 66 y.o.   MRN: [id number]  HPI: [name] Giles is a 66 y.o. female who is here for HFU appointment for follow-up of her. ICH ( Intracerebral hemorrhage),  Essential Hypertension and Hyperlipidemia. She presented to [facility] on 03/[facility]/2022 for altered mental status after being found down.   CT Head: WO Contrast: CT Cervical Spine   IMPRESSION: 1. At least 5 x 4.5 x 4 cm intraparenchymal hemorrhage centered within the left anterior temporal and frontal lobes. Associated intraventricular extension of the hemorrhage. Associated 5 mm left-to-right midline shift. Impending uncal herniation on the left. Underlying mass lesion not excluded. 2. No acute displaced fracture or traumatic listhesis of the cervical spine. 3.  Emphysema (ICD10-J43.9).  MRI Brain: WO Contrast:  IMPRESSION: 1. 68 mL intraparenchymal hemorrhage centered at the left temporal lobe, similar in size as compared to prior CT. Surrounding vasogenic edema with regional mass effect, with worsened 1 cm of left-to-right shift. 2. Irregular heterogeneous area of enhancement along the lateral margin of the hemorrhage, nonspecific, but raises the possibility for an underlying vascular lesion. Further evaluation with dedicated CTA and/or catheter directed angiogram recommended for further evaluation. Possible mass not excluded, although is felt to be less likely. 3. 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 Interval placement of a right frontal approach ventriculostomy with decompression of the right lateral ventricle. Ventriculomegaly involving the left lateral ventricle is relatively unchanged. 4. Few small remote bilateral cerebellar infarcts.    [name] Giles underwent right frontal ventriculostomy via bur hole, by Dr Arnoldo Morale.   [name] Giles was admitted to inpatient rehabilitation on 04/16/2022 and  discharged to [facility]  on 04/27/2022. She will be discharged to her home on 05/27/2022, her sister states.  She denies any pain at this time. She rated her pain 8 on Health and History. She also states she has a good appetite.  She is receiving Home Health Therapy with [facility].   Sister in room, all questions answered.   Sister does not feel she is walking enough Hard to walk when it's so cold outside She is not doing her home exercise program.  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      Pain Inventory Average Pain 2 Pain Right Now 0 My pain is constant and dull  LOCATION OF PAIN  Head, Legs, Ankles, Feet  BOWEL Number of stools per week: 20  BLADDER Normal    Mobility walk without assistance walk with assistance use a walker how many minutes can you walk? 10-15 ability to climb steps?  yes do you drive?  no Do you have any goals in this area?  yes  Function disabled: date disabled .  Neuro/Psych numbness tingling trouble walking  Prior Studies Any changes since last visit?  no  Physicians involved in your care Any changes since last visit?  no   Family History  Adopted: Yes  Problem Relation Age of Onset   Breast cancer Mother    Hypertension Brother    Diabetes Brother    Hypertension Brother    Colon cancer Neg Hx    Social History   Socioeconomic History   Marital status: Single    Spouse name: Not on file   Number of children: Not on file   Years of education: Not on file   Highest  education level: Not on file  Occupational History   Not on file  Tobacco Use   Smoking status: Every Day    Packs/day: 1.00    Years: 30.00    Total pack years: 30.00    Types: Cigarettes   Smokeless tobacco: Never  Substance and Sexual Activity   Alcohol use: Yes    Comment: wine occasionally   Drug use: No   Sexual activity: Not Currently  Other Topics Concern   Not on  file  Social History Narrative   Not on file   Social Determinants of Health   Financial Resource Strain: Not on file  Food Insecurity: Not on file  Transportation Needs: Not on file  Physical Activity: Not on file  Stress: Not on file  Social Connections: Not on file   Past Surgical History:  Procedure Laterality Date   COLONOSCOPY     COLONOSCOPY N/A 04/06/2013   Procedure: COLONOSCOPY;  Surgeon: Daneil Dolin, MD;  [facility]: [facility];  Service: Endoscopy;  Laterality: N/A;  9:30 AM   ENDOMETRIAL ABLATION     IR ANGIO EXTERNAL CAROTID SEL EXT CAROTID UNI L MOD SED  07/03/2022   IR ANGIO INTRA EXTRACRAN SEL INTERNAL CAROTID BILAT MOD SED  04/04/2022   IR ANGIO INTRA EXTRACRAN SEL INTERNAL CAROTID BILAT MOD SED  07/03/2022   IR ANGIO VERTEBRAL SEL VERTEBRAL UNI L MOD SED  07/03/2022   IR US GUIDE VASC ACCESS RIGHT  04/04/2022   Past Medical History:  Diagnosis Date   Asthma    Medical history non-contributory    BP 121/76   Pulse 88   Ht '5\' 5"'$  (1.651 m)   Wt 138 lb (62.6 kg)   SpO2 94%   BMI 22.96 kg/m   Opioid Risk Score:   Fall Risk Score:  `1  Depression screen [facility] 2/9     05/23/2022    2:05 PM 04/04/2017   10:18 AM  Depression screen PHQ 2/9  Decreased Interest 1 0  Down, Depressed, Hopeless 0 0  PHQ - 2 Score 1 0  Altered sleeping 0   Tired, decreased energy 0   Change in appetite 0   Feeling bad or failure about yourself  0   Trouble concentrating 0   Moving slowly or fidgety/restless 1   Suicidal thoughts 0   PHQ-9 Score 2   Difficult doing work/chores Not difficult at all      Review of Systems  Musculoskeletal:  Positive for gait problem.       Head, Leg, Ankle, Foot pain  All other systems reviewed and are negative.     Objective:   Physical Exam Vitals and nursing note reviewed.  Constitutional:      Appearance: Normal appearance.  Cardiovascular:     Rate and Rhythm: Normal rate and regular rhythm.     Pulses: Normal pulses.      Heart sounds: Normal heart sounds.  Pulmonary:     Effort: Pulmonary effort is normal.     Breath sounds: Normal breath sounds.  Musculoskeletal:     Cervical back: Normal range of motion and neck supple.     Comments: Normal Muscle Bulk and Muscle Testing Reveals:  Upper Extremities: Full ROM and Muscle Strength 5/5 Lower Extremities: Full ROM and Muscle Strength 5/5 Arises from Table with Ease Narrow Based  Gait     Skin:    General: Skin is warm and dry.  Neurological:     Mental Status: She is  alert and oriented to person, place, and time.  Psychiatric:        Mood and Affect: Mood normal.        Behavior: Behavior normal.         Assessment & Plan:  ICH ( Intracerebral hemorrhage): S/P right frontal ventriculostomy via bur hole, by Dr Arnoldo Morale on 03/29/2022 Discussed recovery Encouraged walking daily within the home when it is cold outside  Essential Hypertension: Continue current medication regimen. Discussed that this is looking excellent -BP is 121/76 Can decreased HCTZ to every other day or stop medication.  -Advised checking BP daily at home and logging results to bring into follow-up appointment with PCP and myself. -Reviewed BP meds today.  -Advised regarding healthy foods that can help lower blood pressure and provided with a list: 1) citrus foods- high in vitamins and minerals 2) salmon and other fatty fish - reduces inflammation and oxylipins 3) swiss chard (leafy green)- high level of nitrates 4) pumpkin seeds- one of the best natural sources of magnesium 5) Beans and lentils- high in fiber, magnesium, and potassium 6) Berries- high in flavonoids 7) Amaranth (whole grain, can be cooked similarly to rice and oats)- high in magnesium and fiber 8) Pistachios- even more effective at reducing BP than other nuts 9) Carrots- high in phenolic compounds that relax blood vessels and reduce inflammation 10) Celery- contain phthalides that relax tissues of arterial  walls 11) Tomatoes- can also improve cholesterol and reduce risk of heart disease 12) Broccoli- good source of magnesium, calcium, and potassium 13) Greek yogurt: high in potassium and calcium 14) Herbs and spices: Celery seed, cilantro, saffron, lemongrass, black cumin, ginseng, cinnamon, cardamom, sweet basil, and ginger 15) Chia and flax seeds- also help to lower cholesterol and blood sugar 16) Beets- high levels of nitrates that relax blood vessels  17) spinach and bananas- high in potassium  -Provided lise of supplements that can help with hypertension:  1) magnesium: one high quality brand is Bioptemizers since it contains all 7 types of magnesium, otherwise over the counter magnesium gluconate '400mg'$  is a good option 2) B vitamins 3) vitamin D 4) potassium 5) CoQ10 6) L-arginine 7) Vitamin C 8) Beetroot -Educated that goal BP is 120/80. -Made goal to incorporate some of the above foods into diet.      Hyperlipidemia: Continue current medication regimen. PCP will follow after discharge from [facility]isted Living .   Provided with list of supplements that can help with dyslipidemia: 1) Vitamin B3 500-4,'000mg'$  in divided doses daily (would recommend starting low as can cause uncomfortable facial flushing if started at too high a dose) 2) Phytosterols 2.15 grams daily 3) Fermented soy 30-50 grams daily 4) EGCG (found in green tea): 500-'1000mg'$  daily 5) Omega-3 fatty acids 3000-5,'000mg'$  daily 6) Flax seed 40 grams daily 7) Monounsaturated fats 20-40 grams daily (olives, olive oil, nuts), also reduces cardiovascular disease 8) Sesame: 40 grams daily 9) Gamma/delta tocotrienols- a family of unsaturated forms of Vitamin E- '200mg'$  with dinner 10) Pantethine '900mg'$  daily in divided doses 11) Resveratrol '250mg'$  daily 12) N Acetyl Cysteine '2000mg'$  daily in divided doses 13) Curcumin 2000-'5000mg'$  in divided doses daily 14) Pomegranate juice: 8 ounces daily, also helps to lower blood pressure 15)  Pomegranate seeds one cup daily, also helps to lower blood pressure 16) Citrus Bergamot '1000mg'$  daily, also helps with glucose control and weight loss 17) Vitamin C '500mg'$  daily 18) Quercetin 500-'1000mg'$  daily 19) Glutathione 20) Probiotics 60-100 billion organisms per day 21) Fiber 22)  Oats 23) Aged garlic (can eat as food or supplement of 600-'900mg'$  per day) 24) Chia seeds 25 grams per day 25) Lycopene- carotenoid found in high concentrations in tomatoes. 26) Alpha linolenic acid 27) Flavonoids and anthocyanins 28) Wogonin- flavanoid that enhances reverse cholesterol transport 29) Coenzyme Q10 30) Pantethine- derivative of Vitamin B5: '300mg'$  three times per day or '450mg'$  twice per day with or without food 31) Barley and other whole grains 32) Orange juice 33) L- carnitine 34) L- Lysine 35) L- Arginine 36) Almonds 37) Morin 38) Rutin 39) Carnosine 40) Histidine  41) Kaempferol  42) Organosulfur compounds 43) Vitamin E 44) Oleic acid 45) RBO (ferulic acid gammaoryzanol) 46) grape seed extract 47) Red wine 48) Berberine HCL '500mg'$  daily or twice per day- more effective and with fewer adverse effects that ezetimibe monotherapy 49) red yeast rice 2400- 4800 mg/day 50) chlorella 51) Licorice

## 2022-07-20 ENCOUNTER — Encounter: Payer: Medicare Other | Attending: Registered Nurse | Admitting: Physical Medicine and Rehabilitation

## 2022-07-20 DIAGNOSIS — I10 Essential (primary) hypertension: Secondary | ICD-10-CM | POA: Insufficient documentation

## 2022-07-20 DIAGNOSIS — E7800 Pure hypercholesterolemia, unspecified: Secondary | ICD-10-CM | POA: Diagnosis not present

## 2022-07-20 LAB — BASIC METABOLIC PANEL
BUN/Creatinine Ratio: 14 (ref 12–28)
BUN: 10 mg/dL (ref 8–27)
CO2: 26 mmol/L (ref 20–29)
Calcium: 10.4 mg/dL — ABNORMAL HIGH (ref 8.7–10.3)
Chloride: 98 mmol/L (ref 96–106)
Creatinine, Ser: 0.69 mg/dL (ref 0.57–1.00)
Glucose: 107 mg/dL — ABNORMAL HIGH (ref 70–99)
Potassium: 3.9 mmol/L (ref 3.5–5.2)
Sodium: 141 mmol/L (ref 134–144)
eGFR: 96 mL/min/{1.73_m2} (ref >59)

## 2022-07-20 LAB — LIPID PANEL
Chol/HDL Ratio: 2.9 ratio (ref 0.0–4.4)
Cholesterol, Total: 229 mg/dL — ABNORMAL HIGH (ref 100–199)
HDL: 79 mg/dL (ref >39)
LDL Chol Calc (NIH): 138 mg/dL — ABNORMAL HIGH (ref 0–99)
Triglycerides: 69 mg/dL (ref 0–149)
VLDL Cholesterol Cal: 12 mg/dL (ref 5–40)

## 2022-07-21 NOTE — Progress Notes (Unsigned)
Subjective:    Patient ID: [name] Giles, female    DOB: 1956/07/18, 66 y.o.   MRN: [id number]  HPI: [name] Giles is a 66 y.o. female who is here for f/u of her ICH ( Intracerebral hemorrhage),  Essential Hypertension and Hyperlipidemia. She presented to [facility] on 03/29/2022 for altered mental status after being found down.   CT Head: WO Contrast: CT Cervical Spine   IMPRESSION: 1. At least 5 x 4.5 x 4 cm intraparenchymal hemorrhage centered within the left anterior temporal and frontal lobes. Associated intraventricular extension of the hemorrhage. Associated 5 mm left-to-right midline shift. Impending uncal herniation on the left. Underlying mass lesion not excluded. 2. No acute displaced fracture or traumatic listhesis of the cervical spine. 3.  Emphysema (ICD10-J43.9).  MRI Brain: WO Contrast:  IMPRESSION: 1. 68 mL intraparenchymal hemorrhage centered at the left temporal lobe, similar in size as compared to prior CT. Surrounding vasogenic edema with regional mass effect, with worsened 1 cm of left-to-right shift. 2. Irregular heterogeneous area of enhancement along the lateral margin of the hemorrhage, nonspecific, but raises the possibility for an underlying vascular lesion. Further evaluation with dedicated CTA and/or catheter directed angiogram recommended for further evaluation. Possible mass not excluded, although is felt to be less likely. 3. 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 Interval placement of a right frontal approach ventriculostomy with decompression of the right lateral ventricle. Ventriculomegaly involving the left lateral ventricle is relatively unchanged. 4. Few small remote bilateral cerebellar infarcts.    [name] Giles underwent right frontal ventriculostomy via bur hole, by Dr Arnoldo Morale.   [name] Giles was admitted to inpatient rehabilitation on 04/16/2022 and discharged to [facility]  on 04/27/2022. She will be discharged to her home on 05/27/2022, her sister states.  She denies any pain at this time. She rated her pain 8 on Health and History. She also states she has a good appetite.  She is receiving Home Health Therapy with [facility].   1) ICH Sister does not feel she is walking enough Hard to walk when it's so cold outside She is not doing her home exercise program.  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   2) HLD -discussed her lipid panel results      Pain Inventory Average Pain 2 Pain Right Now 0 My pain is constant and dull  LOCATION OF PAIN  Head, Legs, Ankles, Feet  BOWEL Number of stools per week: 20  BLADDER Normal    Mobility walk without assistance walk with assistance use a walker how many minutes can you walk? 10-15 ability to climb steps?  yes do you drive?  no Do you have any goals in this area?  yes  Function disabled: date disabled .  Neuro/Psych numbness tingling trouble walking  Prior Studies Any changes since last visit?  no  Physicians involved in your care Any changes since last visit?  no   Family History  Adopted: Yes  Problem Relation Age of Onset   Breast cancer Mother    Hypertension Brother    Diabetes Brother    Hypertension Brother    Colon cancer Neg Hx    Social History   Socioeconomic History   Marital status: Single    Spouse name: Not on file   Number of children: Not on file   Years of education: Not on file   Highest  education level: Not on file  Occupational History   Not on file  Tobacco Use   Smoking status: Every Day    Packs/day: 1.00    Years: 30.00    Total pack years: 30.00    Types: Cigarettes   Smokeless tobacco: Never  Substance and Sexual Activity   Alcohol use: Yes    Comment: wine occasionally   Drug use: No   Sexual activity: Not Currently  Other Topics Concern   Not on file  Social  History Narrative   Not on file   Social Determinants of Health   Financial Resource Strain: Not on file  Food Insecurity: Not on file  Transportation Needs: Not on file  Physical Activity: Not on file  Stress: Not on file  Social Connections: Not on file   Past Surgical History:  Procedure Laterality Date   COLONOSCOPY     COLONOSCOPY N/A 04/06/2013   Procedure: COLONOSCOPY;  Surgeon: Daneil Dolin, MD;  Location: [facility];  Service: Endoscopy;  Laterality: N/A;  9:30 AM   ENDOMETRIAL ABLATION     IR ANGIO EXTERNAL CAROTID SEL EXT CAROTID UNI L MOD SED  07/03/2022   IR ANGIO INTRA EXTRACRAN SEL INTERNAL CAROTID BILAT MOD SED  04/04/2022   IR ANGIO INTRA EXTRACRAN SEL INTERNAL CAROTID BILAT MOD SED  07/03/2022   IR ANGIO VERTEBRAL SEL VERTEBRAL UNI L MOD SED  07/03/2022   IR US GUIDE VASC ACCESS RIGHT  04/04/2022   Past Medical History:  Diagnosis Date   Asthma    Medical history non-contributory    There were no vitals taken for this visit.  Opioid Risk Score:   Fall Risk Score:  `1  Depression screen [facility] 2/9     05/23/2022    2:05 PM 04/04/2017   10:18 AM  Depression screen [facility] 2/9  Decreased Interest 1 0  Down, Depressed, Hopeless 0 0  PHQ - 2 Score 1 0  Altered sleeping 0   Tired, decreased energy 0   Change in appetite 0   Feeling bad or failure about yourself  0   Trouble concentrating 0   Moving slowly or fidgety/restless 1   Suicidal thoughts 0   PHQ-9 Score 2   Difficult doing work/chores Not difficult at all      Review of Systems  Musculoskeletal:  Positive for gait problem.       Head, Leg, Ankle, Foot pain  All other systems reviewed and are negative.      Objective:  Not performed       Assessment & Plan:  ICH ( Intracerebral hemorrhage): S/P right frontal ventriculostomy via bur hole, by Dr Arnoldo Morale on 03/29/2022 Discussed recovery Encouraged walking daily within the home when it is cold outside  Essential Hypertension: Continue  current medication regimen. Discussed that this is looking excellent -BP is 121/76 Can decreased HCTZ to every other day or stop medication.  -Advised checking BP daily at home and logging results to bring into follow-up appointment with PCP and myself. -Reviewed BP meds today.  -Advised regarding healthy foods that can help lower blood pressure and provided with a list: 1) citrus foods- high in vitamins and minerals 2) salmon and other fatty fish - reduces inflammation and oxylipins 3) swiss chard (leafy green)- high level of nitrates 4) pumpkin seeds- one of the best natural sources of magnesium 5) Beans and lentils- high in fiber, magnesium, and potassium 6) Berries- high in flavonoids 7) Amaranth (whole grain, can be  cooked similarly to rice and oats)- high in magnesium and fiber 8) Pistachios- even more effective at reducing BP than other nuts 9) Carrots- high in phenolic compounds that relax blood vessels and reduce inflammation 10) Celery- contain phthalides that relax tissues of arterial walls 11) Tomatoes- can also improve cholesterol and reduce risk of heart disease 12) Broccoli- good source of magnesium, calcium, and potassium 13) Greek yogurt: high in potassium and calcium 14) Herbs and spices: Celery seed, cilantro, saffron, lemongrass, black cumin, ginseng, cinnamon, cardamom, sweet basil, and ginger 15) Chia and flax seeds- also help to lower cholesterol and blood sugar 16) Beets- high levels of nitrates that relax blood vessels  17) spinach and bananas- high in potassium  -Provided lise of supplements that can help with hypertension:  1) magnesium: one high quality brand is Bioptemizers since it contains all 7 types of magnesium, otherwise over the counter magnesium gluconate '400mg'$  is a good option 2) B vitamins 3) vitamin D 4) potassium 5) CoQ10 6) L-arginine 7) Vitamin C 8) Beetroot -Educated that goal BP is 120/80. -Made goal to incorporate some of the above  foods into diet.      Hyperlipidemia: Continue current medication regimen.  Discussed that total and LDL cholesterol have slightly worsened so recommended continuing statin  Provided with list of supplements that can help with dyslipidemia: 1) Vitamin B3 500-4,'000mg'$  in divided doses daily (would recommend starting low as can cause uncomfortable facial flushing if started at too high a dose) 2) Phytosterols 2.15 grams daily 3) Fermented soy 30-50 grams daily 4) EGCG (found in green tea): 500-'1000mg'$  daily 5) Omega-3 fatty acids 3000-5,'000mg'$  daily 6) Flax seed 40 grams daily 7) Monounsaturated fats 20-40 grams daily (olives, olive oil, nuts), also reduces cardiovascular disease 8) Sesame: 40 grams daily 9) Gamma/delta tocotrienols- a family of unsaturated forms of Vitamin E- '200mg'$  with dinner 10) Pantethine '900mg'$  daily in divided doses 11) Resveratrol '250mg'$  daily 12) N Acetyl Cysteine '2000mg'$  daily in divided doses 13) Curcumin 2000-'5000mg'$  in divided doses daily 14) Pomegranate juice: 8 ounces daily, also helps to lower blood pressure 15) Pomegranate seeds one cup daily, also helps to lower blood pressure 16) Citrus Bergamot '1000mg'$  daily, also helps with glucose control and weight loss 17) Vitamin C '500mg'$  daily 18) Quercetin 500-'1000mg'$  daily 19) Glutathione 20) Probiotics 60-100 billion organisms per day 21) Fiber 22) Oats 23) Aged garlic (can eat as food or supplement of 600-'900mg'$  per day) 24) Chia seeds 25 grams per day 25) Lycopene- carotenoid found in high concentrations in tomatoes. 26) Alpha linolenic acid 27) Flavonoids and anthocyanins 28) Wogonin- flavanoid that enhances reverse cholesterol transport 29) Coenzyme Q10 30) Pantethine- derivative of Vitamin B5: '300mg'$  three times per day or '450mg'$  twice per day with or without food 31) Barley and other whole grains 32) Orange juice 33) L- carnitine 34) L- Lysine 35) L- Arginine 36) Almonds 37) Morin 38) Rutin 39)  Carnosine 40) Histidine  41) Kaempferol  42) Organosulfur compounds 43) Vitamin E 44) Oleic acid 45) RBO (ferulic acid gammaoryzanol) 46) grape seed extract 47) Red wine 48) Berberine HCL '500mg'$  daily or twice per day- more effective and with fewer adverse effects that ezetimibe monotherapy 49) red yeast rice 2400- 4800 mg/day 50) chlorella 51) Licorice   6 minutes spent in discussion of lipid panel results, discussed continuing statin since LDL slightly worsened

## 2022-07-22 ENCOUNTER — Ambulatory Visit (HOSPITAL_COMMUNITY)
Admission: RE | Admit: 2022-07-22 | Discharge: 2022-07-22 | Disposition: A | Discharge status: Home / Self Care | Payer: Medicare Other | Source: Ambulatory Visit | Attending: Neurosurgery | Admitting: Neurosurgery

## 2022-07-22 DIAGNOSIS — Q282 Arteriovenous malformation of cerebral vessels: Secondary | ICD-10-CM | POA: Diagnosis present

## 2022-07-22 MED ORDER — GADOBUTROL 1 MMOL/ML IV SOLN
6.2000 mL | Freq: Once | INTRAVENOUS | Status: AC | PRN
  Administered 2022-07-22: 6.2 mL via INTRAVENOUS

## 2022-07-23 ENCOUNTER — Other Ambulatory Visit: Payer: Self-pay | Admitting: Neurosurgery

## 2022-07-24 ENCOUNTER — Telehealth: Payer: Self-pay | Admitting: Physical Medicine and Rehabilitation

## 2022-07-24 NOTE — Telephone Encounter (Signed)
[name] Giles patient's sister called requesting to clarify medication change for patient. Patient was told she will need cholesterol medication and wants to know if Dr Ranell Patrick was going to send or PCP and question about stopping BP meds , should patient only take as needed or completely stop

## 2022-07-27 NOTE — Pre-Procedure Instructions (Signed)
Surgical Instructions    Your procedure is scheduled on Tuesday, July 31, 2022.  Report to [facility] Main Entrance "A" at 5:30 A.M., then check in with the Admitting office.  Call this number if you have problems the morning of surgery:  [phone]   If you have any questions prior to your surgery date call [phone]: Open Monday-Friday 8am-4pm If you experience any cold or flu symptoms such as cough, fever, chills, shortness of breath, etc. between now and your scheduled surgery, please notify us at the above number     Remember:  Do not eat or drink after midnight the night before your surgery     Take these medicines the morning of surgery with A SIP OF WATER:   budesonide-formoterol (SYMBICORT)   albuterol (VENTOLIN HFA) 108 (90 Base) MCG/ACT inhaler -if needed. Please bring this inhaler with you day of surgery.   As of today, STOP taking any Aspirin (unless otherwise instructed by your surgeon) Aleve, Naproxen, Ibuprofen, Motrin, Advil, Goody's, BC's, all herbal medications, fish oil, and all vitamins.           Do not wear jewelry or makeup. Do not wear lotions, powders, perfumes or deodorant. Do not shave 48 hours prior to surgery.  Do not bring valuables to the hospital. Do not wear nail polish, gel polish, artificial nails, or any other type of covering on natural nails (fingers and toes) If you have artificial nails or gel coating that need to be removed by a nail salon, please have this removed prior to surgery. Artificial nails or gel coating may interfere with anesthesia's ability to adequately monitor your vital signs.  [facility] is not responsible for any belongings or valuables.    Do NOT Smoke (Tobacco/Vaping)  24 hours prior to your procedure  If you use a CPAP at night, you may bring your mask for your overnight stay.   Contacts, glasses, hearing aids, dentures or partials may not be worn into surgery, please bring cases for these belongings   For  patients admitted to the hospital, discharge time will be determined by your treatment team.   Patients discharged the day of surgery will not be allowed to drive home, and someone needs to stay with them for 24 hours.   SURGICAL WAITING ROOM VISITATION Patients having surgery or a procedure may have no more than 2 support people in the waiting area - these visitors may rotate.   Children under the age of 52 must have an adult with them who is not the patient. If the patient needs to stay at the hospital during part of their recovery, the visitor guidelines for inpatient rooms apply. Pre-op nurse will coordinate an appropriate time for 1 support person to accompany patient in pre-op.  This support person may not rotate.   Please refer to [email or web address] for the visitor guidelines for Inpatients (after your surgery is over and you are in a regular room).    Special instructions:    Oral Hygiene is also important to reduce your risk of infection.  Remember - BRUSH YOUR TEETH THE MORNING OF SURGERY WITH YOUR REGULAR TOOTHPASTE   [place]- Preparing For Surgery  Before surgery, you can play an important role. Because skin is not sterile, your skin needs to be as free of germs as possible. You can reduce the number of germs on your skin by washing with CHG (chlorahexidine gluconate) Soap before surgery.  CHG is an antiseptic cleaner which kills germs and bonds  with the skin to continue killing germs even after washing.     Please do not use if you have an allergy to CHG or antibacterial soaps. If your skin becomes reddened/irritated stop using the CHG.  Do not shave (including legs and underarms) for at least 48 hours prior to first CHG shower. It is OK to shave your face.  Please follow these instructions carefully.     Shower the NIGHT BEFORE SURGERY and the MORNING OF SURGERY with CHG Soap.   If you chose to wash your hair, wash  your hair first as usual with your normal shampoo. After you shampoo, rinse your hair and body thoroughly to remove the shampoo.  Then [employer] and genitals (private parts) with your normal soap and rinse thoroughly to remove soap.  After that Use CHG Soap as you would any other liquid soap. You can apply CHG directly to the skin and wash gently with a scrungie or a clean washcloth.   Apply the CHG Soap to your body ONLY FROM THE NECK DOWN.  Do not use on open wounds or open sores. Avoid contact with your eyes, ears, mouth and genitals (private parts). Wash Face and genitals (private parts)  with your normal soap.   Wash thoroughly, paying special attention to the area where your surgery will be performed.  Thoroughly rinse your body with warm water from the neck down.  DO NOT shower/wash with your normal soap after using and rinsing off the CHG Soap.  Pat yourself dry with a CLEAN TOWEL.  Wear CLEAN PAJAMAS to bed the night before surgery  Place CLEAN SHEETS on your bed the night before your surgery  DO NOT SLEEP WITH PETS.   Day of Surgery:  Take a shower with CHG soap. Wear Clean/Comfortable clothing the morning of surgery Do not apply any deodorants/lotions.   Remember to brush your teeth WITH YOUR REGULAR TOOTHPASTE.    If you received a COVID test during your pre-op visit, it is requested that you wear a mask when out in public, stay away from anyone that may not be feeling well, and notify your surgeon if you develop symptoms. If you have been in contact with anyone that has tested positive in the last 10 days, please notify your surgeon.    Please read over the following fact sheets that you were given.

## 2022-07-30 ENCOUNTER — Encounter (HOSPITAL_COMMUNITY)
Admission: RE | Admit: 2022-07-30 | Discharge: 2022-07-30 | Disposition: A | Discharge status: Home / Self Care | Payer: Medicare Other | Source: Ambulatory Visit | Attending: Neurosurgery | Admitting: Neurosurgery

## 2022-07-30 ENCOUNTER — Other Ambulatory Visit: Payer: Self-pay

## 2022-07-30 ENCOUNTER — Encounter (HOSPITAL_BASED_OUTPATIENT_CLINIC_OR_DEPARTMENT_OTHER): Payer: Medicare Other | Admitting: Physical Medicine and Rehabilitation

## 2022-07-30 ENCOUNTER — Encounter (HOSPITAL_COMMUNITY): Payer: Self-pay

## 2022-07-30 VITALS — BP 147/71 | HR 80 | Temp 98.0°F | Resp 18 | Ht 65.0 in | Wt 138.8 lb

## 2022-07-30 DIAGNOSIS — E7800 Pure hypercholesterolemia, unspecified: Secondary | ICD-10-CM | POA: Diagnosis not present

## 2022-07-30 DIAGNOSIS — Z01812 Encounter for preprocedural laboratory examination: Secondary | ICD-10-CM | POA: Insufficient documentation

## 2022-07-30 DIAGNOSIS — Z01818 Encounter for other preprocedural examination: Secondary | ICD-10-CM

## 2022-07-30 DIAGNOSIS — I10 Essential (primary) hypertension: Secondary | ICD-10-CM | POA: Diagnosis not present

## 2022-07-30 MED ORDER — ROSUVASTATIN CALCIUM 20 MG PO TABS: 20.0000 mg | ORAL_TABLET | Freq: Every day | ORAL | 0 refills | Status: DC

## 2022-07-30 NOTE — Progress Notes (Unsigned)
Subjective:    Patient ID: [name] Giles, female    DOB: Oct 26, 1955, 66 y.o.   MRN: [id number]  HPI:  An audio/video tele-health visit is felt to be the most appropriate encounter for this patient at this time. This is a follow up tele-visit via phone. The patient is at home. MD is at office. Prior to scheduling this appointment, our staff discussed the limitations of evaluation and management by telemedicine and the availability of in-person appointments. The patient expressed understanding and agreed to proceed.   [name] Giles is a 66 y.o. female who is here for f/u of her ICH ( Intracerebral hemorrhage),  Essential Hypertension and Hyperlipidemia. She presented to [facility] on 03/29/2022 for altered mental status after being found down.   CT Head: WO Contrast: CT Cervical Spine   IMPRESSION: 1. At least 5 x 4.5 x 4 cm intraparenchymal hemorrhage centered within the left anterior temporal and frontal lobes. Associated intraventricular extension of the hemorrhage. Associated 5 mm left-to-right midline shift. Impending uncal herniation on the left. Underlying mass lesion not excluded. 2. No acute displaced fracture or traumatic listhesis of the cervical spine. 3.  Emphysema (ICD10-J43.9).  MRI Brain: WO Contrast:  IMPRESSION: 1. 68 mL intraparenchymal hemorrhage centered at the left temporal lobe, similar in size as compared to prior CT. Surrounding vasogenic edema with regional mass effect, with worsened 1 cm of left-to-right shift. 2. Irregular heterogeneous area of enhancement along the lateral margin of the hemorrhage, nonspecific, but raises the possibility for an underlying vascular lesion. Further evaluation with dedicated CTA and/or catheter directed angiogram recommended for further evaluation. Possible mass not excluded, although is felt to be less likely. 3. 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 Interval  placement of a right frontal approach ventriculostomy with decompression of the right lateral ventricle. Ventriculomegaly involving the left lateral ventricle is relatively unchanged. 4. Few small remote bilateral cerebellar infarcts.    Ms. [name] Giles underwent right frontal ventriculostomy via bur hole, by Dr Arnoldo Morale.   [name] was admitted to inpatient rehabilitation on 04/16/2022 and discharged to [facility]  on 04/27/2022. She will be discharged to her home on 05/27/2022, her sister states.  She denies any pain at this time. She rated her pain 8 on Health and History. She also states she has a good appetite.  She is receiving Home Health Therapy with [facility].   1) ICH Sister does not feel she is walking enough Hard to walk when it's so cold outside She is not doing her home exercise program.  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   2) HLD -discussed her lipid panel results -her sister asks whether I will be sending the statin for her or whether she should reach out to her PCP      Pain Inventory Average Pain 2 Pain Right Now 0 My pain is constant and dull  LOCATION OF PAIN  Head, Legs, Ankles, Feet  BOWEL Number of stools per week: 20  BLADDER Normal    Mobility walk without assistance walk with assistance use a walker how many minutes can you walk? 10-15 ability to climb steps?  yes do you drive?  no Do you have any goals in this area?  yes  Function disabled: date disabled .  Neuro/Psych numbness tingling trouble walking  Prior Studies Any changes since last visit?  no  Physicians involved in your  care Any changes since last visit?  no   Family History  Adopted: Yes  Problem Relation Age of Onset   Breast cancer Mother    Hypertension Brother    Diabetes Brother    Hypertension Brother    Colon cancer Neg Hx    Social History   Socioeconomic History   Marital  status: Single    Spouse name: Not on file   Number of children: Not on file   Years of education: Not on file   Highest education level: Not on file  Occupational History   Not on file  Tobacco Use   Smoking status: Former    Packs/day: 1.00    Years: 30.00    Total pack years: 30.00    Types: Cigarettes    Quit date: 03/29/2022    Years since quitting: 0.3   Smokeless tobacco: Never  Vaping Use   Vaping Use: Never used  Substance and Sexual Activity   Alcohol use: Yes    Comment: wine occasionally   Drug use: No   Sexual activity: Not Currently  Other Topics Concern   Not on file  Social History Narrative   Not on file   Social Determinants of Health   Financial Resource Strain: Not on file  Food Insecurity: Not on file  Transportation Needs: Not on file  Physical Activity: Not on file  Stress: Not on file  Social Connections: Not on file   Past Surgical History:  Procedure Laterality Date   COLONOSCOPY     COLONOSCOPY N/A 04/06/2013   Procedure: COLONOSCOPY;  Surgeon: Daneil Dolin, MD;  Location: [facility];  Service: Endoscopy;  Laterality: N/A;  9:30 AM   ENDOMETRIAL ABLATION     IR ANGIO EXTERNAL CAROTID SEL EXT CAROTID UNI L MOD SED  07/03/2022   IR ANGIO INTRA EXTRACRAN SEL INTERNAL CAROTID BILAT MOD SED  04/04/2022   IR ANGIO INTRA EXTRACRAN SEL INTERNAL CAROTID BILAT MOD SED  07/03/2022   IR ANGIO VERTEBRAL SEL VERTEBRAL UNI L MOD SED  07/03/2022   IR US GUIDE VASC ACCESS RIGHT  04/04/2022   Past Medical History:  Diagnosis Date   Asthma    Medical history non-contributory    There were no vitals taken for this visit.  Opioid Risk Score:   Fall Risk Score:  `1  Depression screen [facility] 2/9     05/23/2022    2:05 PM 04/04/2017   10:18 AM  Depression screen [facility] 2/9  Decreased Interest 1 0  Down, Depressed, Hopeless 0 0  PHQ - 2 Score 1 0  Altered sleeping 0   Tired, decreased energy 0   Change in appetite 0   Feeling bad or failure about  yourself  0   Trouble concentrating 0   Moving slowly or fidgety/restless 1   Suicidal thoughts 0   PHQ-9 Score 2   Difficult doing work/chores Not difficult at all      Review of Systems  Musculoskeletal:  Positive for gait problem.       Head, Leg, Ankle, Foot pain  All other systems reviewed and are negative.      Objective:  Not performed       Assessment & Plan:  ICH ( Intracerebral hemorrhage): S/P right frontal ventriculostomy via bur hole, by Dr Arnoldo Morale on 03/29/2022 Discussed recovery Encouraged walking daily within the home when it is cold outside  Essential Hypertension: Continue current medication regimen. Discussed that this is looking excellent -BP is  121/76, recommended checking her BP daily Can decreased HCTZ to every other day or stop medication.  -Advised checking BP daily at home and logging results to bring into follow-up appointment with PCP and myself. -Reviewed BP meds today.  -Advised regarding healthy foods that can help lower blood pressure and provided with a list: 1) citrus foods- high in vitamins and minerals 2) salmon and other fatty fish - reduces inflammation and oxylipins 3) swiss chard (leafy green)- high level of nitrates 4) pumpkin seeds- one of the best natural sources of magnesium 5) Beans and lentils- high in fiber, magnesium, and potassium 6) Berries- high in flavonoids 7) Amaranth (whole grain, can be cooked similarly to rice and oats)- high in magnesium and fiber 8) Pistachios- even more effective at reducing BP than other nuts 9) Carrots- high in phenolic compounds that relax blood vessels and reduce inflammation 10) Celery- contain phthalides that relax tissues of arterial walls 11) Tomatoes- can also improve cholesterol and reduce risk of heart disease 12) Broccoli- good source of magnesium, calcium, and potassium 13) Greek yogurt: high in potassium and calcium 14) Herbs and spices: Celery seed, cilantro, saffron, lemongrass,  black cumin, ginseng, cinnamon, cardamom, sweet basil, and ginger 15) Chia and flax seeds- also help to lower cholesterol and blood sugar 16) Beets- high levels of nitrates that relax blood vessels  17) spinach and bananas- high in potassium  -Provided lise of supplements that can help with hypertension:  1) magnesium: one high quality brand is Bioptemizers since it contains all 7 types of magnesium, otherwise over the counter magnesium gluconate '400mg'$  is a good option 2) B vitamins 3) vitamin D 4) potassium 5) CoQ10 6) L-arginine 7) Vitamin C 8) Beetroot -Educated that goal BP is 120/80. -Made goal to incorporate some of the above foods into diet.      Hyperlipidemia: Continue current medication regimen.  Discussed that total and LDL cholesterol have slightly worsened so recommended continuing statin  Provided with list of supplements that can help with dyslipidemia: 1) Vitamin B3 500-4,'000mg'$  in divided doses daily (would recommend starting low as can cause uncomfortable facial flushing if started at too high a dose) 2) Phytosterols 2.15 grams daily 3) Fermented soy 30-50 grams daily 4) EGCG (found in green tea): 500-'1000mg'$  daily 5) Omega-3 fatty acids 3000-5,'000mg'$  daily 6) Flax seed 40 grams daily 7) Monounsaturated fats 20-40 grams daily (olives, olive oil, nuts), also reduces cardiovascular disease 8) Sesame: 40 grams daily 9) Gamma/delta tocotrienols- a family of unsaturated forms of Vitamin E- '200mg'$  with dinner 10) Pantethine '900mg'$  daily in divided doses 11) Resveratrol '250mg'$  daily 12) N Acetyl Cysteine '2000mg'$  daily in divided doses 13) Curcumin 2000-'5000mg'$  in divided doses daily 14) Pomegranate juice: 8 ounces daily, also helps to lower blood pressure 15) Pomegranate seeds one cup daily, also helps to lower blood pressure 16) Citrus Bergamot '1000mg'$  daily, also helps with glucose control and weight loss 17) Vitamin C '500mg'$  daily 18) Quercetin 500-'1000mg'$  daily 19)  Glutathione 20) Probiotics 60-100 billion organisms per day 21) Fiber 22) Oats 23) Aged garlic (can eat as food or supplement of 600-'900mg'$  per day) 24) Chia seeds 25 grams per day 25) Lycopene- carotenoid found in high concentrations in tomatoes. 26) Alpha linolenic acid 27) Flavonoids and anthocyanins 28) Wogonin- flavanoid that enhances reverse cholesterol transport 29) Coenzyme Q10 30) Pantethine- derivative of Vitamin B5: '300mg'$  three times per day or '450mg'$  twice per day with or without food 31) Barley and other whole grains 32) Orange juice 33)  L- carnitine 34) L- Lysine 35) L- Arginine 36) Almonds 37) Morin 38) Rutin 39) Carnosine 40) Histidine  41) Kaempferol  42) Organosulfur compounds 43) Vitamin E 44) Oleic acid 45) RBO (ferulic acid gammaoryzanol) 46) grape seed extract 47) Red wine 48) Berberine HCL '500mg'$  daily or twice per day- more effective and with fewer adverse effects that ezetimibe monotherapy 49) red yeast rice 2400- 4800 mg/day 50) chlorella 51) Licorice   5 minutes spent in discussion of her hyperlipidemia, sending a statin for her to resume after her surgery tomorrow, checking BP daily

## 2022-07-30 NOTE — Anesthesia Preprocedure Evaluation (Signed)
Anesthesia Evaluation  Patient identified by MRN, date of birth, ID band Patient awake    Reviewed: Allergy & Precautions, NPO status , Patient's Chart, lab work & pertinent test results  Airway Mallampati: II  TM Distance: >3 FB Neck ROM: Full    Dental no notable dental hx.    Pulmonary asthma , former smoker   Pulmonary exam normal        Cardiovascular hypertension,  Rhythm:Regular Rate:Normal     Neuro/Psych AVM  negative psych ROS   GI/Hepatic negative GI ROS, Neg liver ROS,,,  Endo/Other  negative endocrine ROS    Renal/GU negative Renal ROS  negative genitourinary   Musculoskeletal negative musculoskeletal ROS (+)    Abdominal Normal abdominal exam  (+)   Peds  Hematology negative hematology ROS (+)   Anesthesia Other Findings   Reproductive/Obstetrics                             Anesthesia Physical Anesthesia Plan  ASA: 3  Anesthesia Plan: General   Post-op Pain Management:    Induction: Intravenous  PONV Risk Score and Plan: 3 and Ondansetron, Dexamethasone and Treatment may vary due to age or medical condition  Airway Management Planned: Mask and Oral ETT  Additional Equipment: Arterial line  Intra-op Plan:   Post-operative Plan: Possible Post-op intubation/ventilation  Informed Consent: I have reviewed the patients History and Physical, chart, labs and discussed the procedure including the risks, benefits and alternatives for the proposed anesthesia with the patient or authorized representative who has indicated his/her understanding and acceptance.     Dental advisory given  Plan Discussed with: CRNA  Anesthesia Plan Comments: (Lab Results      Component                Value               Date                      WBC                      6.9                 07/03/2022                HGB                      13.6                07/03/2022                 HCT                      40.7                07/03/2022                MCV                      90.4                07/03/2022                PLT                      324  07/03/2022            Lab Results      Component                Value               Date                      NA                       141                 07/19/2022                K                        3.9                 07/19/2022                CO2                      26                  07/19/2022                GLUCOSE                  107 (H)             07/19/2022                BUN                      10                  07/19/2022                CREATININE               0.69                07/19/2022                CALCIUM                  10.4 (H)            07/19/2022                EGFR                     96                  07/19/2022                GFRNONAA                 >60                 07/03/2022           )       Anesthesia Quick Evaluation

## 2022-07-30 NOTE — Progress Notes (Signed)
PCP - lawrence fusco Cardiologist - Dorris Carnes  PPM/ICD - denies   Chest x-ray - n/a EKG - 03/29/22 Stress Test - denies ECHO - 03/30/22 Cardiac Cath - denies  Sleep Study - denies  Follow your surgeon's instructions on when to stop Aspirin.  If no instructions were given by your surgeon then you will need to call the office to get those instructions.     ERAS Protcol -no   COVID TEST- not needed   Anesthesia review: no  Patient denies shortness of breath, fever, cough and chest pain at PAT appointment   All instructions explained to the patient, with a verbal understanding of the material. Patient agrees to go over the instructions while at home for a better understanding. Patient also instructed to self quarantine after being tested for COVID-19. The opportunity to ask questions was provided.

## 2022-07-31 ENCOUNTER — Inpatient Hospital Stay (HOSPITAL_COMMUNITY): Payer: Medicare Other | Admitting: Anesthesiology

## 2022-07-31 ENCOUNTER — Encounter (HOSPITAL_COMMUNITY): Payer: Self-pay | Admitting: Neurosurgery

## 2022-07-31 ENCOUNTER — Other Ambulatory Visit: Payer: Self-pay

## 2022-07-31 ENCOUNTER — Encounter (HOSPITAL_COMMUNITY)
Admission: RE | Disposition: A | Discharge status: Home / Self Care | Payer: Self-pay | Source: Ambulatory Visit | Attending: Neurosurgery

## 2022-07-31 ENCOUNTER — Inpatient Hospital Stay (HOSPITAL_COMMUNITY)
Admission: RE | Admit: 2022-07-31 | Discharge: 2022-08-03 | DRG: 026 | Disposition: A | Discharge status: Home / Self Care | Payer: Medicare Other | Source: Ambulatory Visit | Attending: Neurosurgery | Admitting: Neurosurgery

## 2022-07-31 DIAGNOSIS — I10 Essential (primary) hypertension: Secondary | ICD-10-CM | POA: Diagnosis not present

## 2022-07-31 DIAGNOSIS — Z419 Encounter for procedure for purposes other than remedying health state, unspecified: Secondary | ICD-10-CM

## 2022-07-31 DIAGNOSIS — Z87891 Personal history of nicotine dependence: Secondary | ICD-10-CM

## 2022-07-31 DIAGNOSIS — Z79899 Other long term (current) drug therapy: Secondary | ICD-10-CM

## 2022-07-31 DIAGNOSIS — J45909 Unspecified asthma, uncomplicated: Secondary | ICD-10-CM | POA: Diagnosis present

## 2022-07-31 DIAGNOSIS — Z7951 Long term (current) use of inhaled steroids: Secondary | ICD-10-CM

## 2022-07-31 DIAGNOSIS — Q282 Arteriovenous malformation of cerebral vessels: Principal | ICD-10-CM

## 2022-07-31 DIAGNOSIS — R4701 Aphasia: Secondary | ICD-10-CM | POA: Diagnosis present

## 2022-07-31 HISTORY — PX: APPLICATION OF CRANIAL NAVIGATION: SHX6578

## 2022-07-31 HISTORY — PX: CRANIOTOMY: SHX93

## 2022-07-31 LAB — PREPARE RBC (CROSSMATCH)

## 2022-07-31 LAB — ABO/RH: ABO/RH(D): O POS

## 2022-07-31 SURGERY — CRANIOTOMY INTRACRANIAL ARTERIO-VENOUS MALFORMATION DURAL COMPLEX (AVM)
Anesthesia: General | Laterality: Left

## 2022-07-31 MED ORDER — PHENYLEPHRINE 80 MCG/ML (10ML) SYRINGE FOR IV PUSH (FOR BLOOD PRESSURE SUPPORT)
PREFILLED_SYRINGE | INTRAVENOUS | Status: AC
  Filled 2022-07-31: qty 10

## 2022-07-31 MED ORDER — LIDOCAINE 2% (20 MG/ML) 5 ML SYRINGE
INTRAMUSCULAR | Status: AC
  Filled 2022-07-31: qty 5

## 2022-07-31 MED ORDER — LABETALOL HCL 5 MG/ML IV SOLN
10.0000 mg | INTRAVENOUS | Status: DC | PRN
  Administered 2022-07-31 – 2022-08-01 (×4): 20 mg via INTRAVENOUS
  Administered 2022-08-02 (×2): 40 mg via INTRAVENOUS
  Administered 2022-08-02: 20 mg via INTRAVENOUS
  Filled 2022-07-31 (×2): qty 4
  Filled 2022-07-31: qty 8
  Filled 2022-07-31 (×3): qty 4
  Filled 2022-07-31: qty 8

## 2022-07-31 MED ORDER — PROPOFOL 10 MG/ML IV BOLUS
INTRAVENOUS | Status: AC
  Filled 2022-07-31: qty 20

## 2022-07-31 MED ORDER — THROMBIN 20000 UNITS EX SOLR
CUTANEOUS | Status: DC | PRN
  Administered 2022-07-31: 20 mL via TOPICAL

## 2022-07-31 MED ORDER — LIDOCAINE HCL (PF) 1 % IJ SOLN
INTRAMUSCULAR | Status: AC
  Filled 2022-07-31: qty 30

## 2022-07-31 MED ORDER — THROMBIN 5000 UNITS EX SOLR
OROMUCOSAL | Status: DC | PRN
  Administered 2022-07-31: 5 mL via TOPICAL

## 2022-07-31 MED ORDER — PHENYLEPHRINE HCL-NACL 20-0.9 MG/250ML-% IV SOLN
INTRAVENOUS | Status: DC | PRN
  Administered 2022-07-31: 50 ug/min via INTRAVENOUS

## 2022-07-31 MED ORDER — DEXAMETHASONE SODIUM PHOSPHATE 10 MG/ML IJ SOLN
INTRAMUSCULAR | Status: DC | PRN
  Administered 2022-07-31: 10 mg via INTRAVENOUS

## 2022-07-31 MED ORDER — ROCURONIUM BROMIDE 10 MG/ML (PF) SYRINGE
PREFILLED_SYRINGE | INTRAVENOUS | Status: AC
  Filled 2022-07-31: qty 10

## 2022-07-31 MED ORDER — INDOCYANINE GREEN 25 MG IV SOLR
INTRAVENOUS | Status: AC
  Filled 2022-07-31: qty 10

## 2022-07-31 MED ORDER — CLEVIDIPINE BUTYRATE 0.5 MG/ML IV EMUL
INTRAVENOUS | Status: AC
  Filled 2022-07-31: qty 100

## 2022-07-31 MED ORDER — ORAL CARE MOUTH RINSE: 15.0000 mL | OROMUCOSAL | Status: DC | PRN

## 2022-07-31 MED ORDER — BACITRACIN ZINC 500 UNIT/GM EX OINT
TOPICAL_OINTMENT | CUTANEOUS | Status: AC
  Filled 2022-07-31: qty 28.35

## 2022-07-31 MED ORDER — LACTATED RINGERS IV SOLN: INTRAVENOUS | Status: DC | PRN

## 2022-07-31 MED ORDER — CLEVIDIPINE BUTYRATE 0.5 MG/ML IV EMUL
INTRAVENOUS | Status: DC | PRN
  Administered 2022-07-31: 4 mg/h via INTRAVENOUS

## 2022-07-31 MED ORDER — LIDOCAINE HCL (PF) 1 % IJ SOLN
INTRAMUSCULAR | Status: DC | PRN
  Administered 2022-07-31: 9 mL

## 2022-07-31 MED ORDER — EPHEDRINE SULFATE-NACL 50-0.9 MG/10ML-% IV SOSY
PREFILLED_SYRINGE | INTRAVENOUS | Status: DC | PRN
  Administered 2022-07-31: 5 mg via INTRAVENOUS

## 2022-07-31 MED ORDER — MIDAZOLAM HCL 2 MG/2ML IJ SOLN
INTRAMUSCULAR | Status: AC
  Filled 2022-07-31: qty 2

## 2022-07-31 MED ORDER — THROMBIN 5000 UNITS EX SOLR
CUTANEOUS | Status: AC
  Filled 2022-07-31: qty 10000

## 2022-07-31 MED ORDER — BACITRACIN ZINC 500 UNIT/GM EX OINT
TOPICAL_OINTMENT | CUTANEOUS | Status: DC | PRN
  Administered 2022-07-31: 1 via TOPICAL

## 2022-07-31 MED ORDER — LIDOCAINE 2% (20 MG/ML) 5 ML SYRINGE
INTRAMUSCULAR | Status: DC | PRN
  Administered 2022-07-31: 60 mg via INTRAVENOUS

## 2022-07-31 MED ORDER — MORPHINE SULFATE (PF) 2 MG/ML IV SOLN
2.0000 mg | INTRAVENOUS | Status: DC | PRN
  Administered 2022-07-31 – 2022-08-01 (×3): 2 mg via INTRAVENOUS
  Filled 2022-07-31 (×3): qty 1

## 2022-07-31 MED ORDER — SODIUM CHLORIDE 0.9 % IV SOLN
INTRAVENOUS | Status: DC | PRN
  Administered 2022-07-31: 1000 mg via INTRAVENOUS

## 2022-07-31 MED ORDER — FENTANYL CITRATE (PF) 100 MCG/2ML IJ SOLN: 25.0000 ug | INTRAMUSCULAR | Status: DC | PRN

## 2022-07-31 MED ORDER — PROPOFOL 10 MG/ML IV BOLUS
INTRAVENOUS | Status: DC | PRN
  Administered 2022-07-31 (×2): 30 mg via INTRAVENOUS
  Administered 2022-07-31: 60 mg via INTRAVENOUS
  Administered 2022-07-31: 140 mg via INTRAVENOUS
  Administered 2022-07-31: 50 mg via INTRAVENOUS

## 2022-07-31 MED ORDER — THROMBIN 20000 UNITS EX SOLR
CUTANEOUS | Status: AC
  Filled 2022-07-31: qty 20000

## 2022-07-31 MED ORDER — ONDANSETRON HCL 4 MG/2ML IJ SOLN
INTRAMUSCULAR | Status: DC | PRN
  Administered 2022-07-31: 4 mg via INTRAVENOUS

## 2022-07-31 MED ORDER — SENNA 8.6 MG PO TABS
1.0000 | ORAL_TABLET | Freq: Two times a day (BID) | ORAL | Status: DC
  Administered 2022-07-31 – 2022-08-02 (×2): 8.6 mg via ORAL
  Filled 2022-07-31 (×6): qty 1

## 2022-07-31 MED ORDER — LEVETIRACETAM IN NACL 500 MG/100ML IV SOLN
500.0000 mg | Freq: Two times a day (BID) | INTRAVENOUS | Status: DC
  Administered 2022-07-31 – 2022-08-03 (×6): 500 mg via INTRAVENOUS
  Filled 2022-07-31 (×6): qty 100

## 2022-07-31 MED ORDER — 0.9 % SODIUM CHLORIDE (POUR BTL) OPTIME
TOPICAL | Status: DC | PRN
  Administered 2022-07-31 (×3): 1000 mL

## 2022-07-31 MED ORDER — BUPIVACAINE HCL 0.5 % IJ SOLN
INTRAMUSCULAR | Status: DC | PRN
  Administered 2022-07-31: 9 mL

## 2022-07-31 MED ORDER — CEFAZOLIN SODIUM-DEXTROSE 1-4 GM/50ML-% IV SOLN
1.0000 g | Freq: Three times a day (TID) | INTRAVENOUS | Status: AC
  Administered 2022-07-31 (×2): 1 g via INTRAVENOUS
  Filled 2022-07-31 (×2): qty 50

## 2022-07-31 MED ORDER — CHLORHEXIDINE GLUCONATE CLOTH 2 % EX PADS: 6.0000 | MEDICATED_PAD | Freq: Once | CUTANEOUS | Status: DC

## 2022-07-31 MED ORDER — ORAL CARE MOUTH RINSE: 15.0000 mL | Freq: Once | OROMUCOSAL | Status: AC

## 2022-07-31 MED ORDER — ONDANSETRON HCL 4 MG/2ML IJ SOLN
4.0000 mg | INTRAMUSCULAR | Status: DC | PRN
  Administered 2022-07-31 (×2): 4 mg via INTRAVENOUS
  Filled 2022-07-31 (×2): qty 2

## 2022-07-31 MED ORDER — ACETAMINOPHEN 10 MG/ML IV SOLN: 1000.0000 mg | Freq: Once | INTRAVENOUS | Status: DC | PRN

## 2022-07-31 MED ORDER — ACETAMINOPHEN 325 MG PO TABS
650.0000 mg | ORAL_TABLET | ORAL | Status: DC | PRN
  Administered 2022-08-01: 650 mg via ORAL
  Filled 2022-07-31: qty 2

## 2022-07-31 MED ORDER — PHENYLEPHRINE 80 MCG/ML (10ML) SYRINGE FOR IV PUSH (FOR BLOOD PRESSURE SUPPORT)
PREFILLED_SYRINGE | INTRAVENOUS | Status: DC | PRN
  Administered 2022-07-31: 80 ug via INTRAVENOUS
  Administered 2022-07-31: 160 ug via INTRAVENOUS

## 2022-07-31 MED ORDER — SODIUM CHLORIDE 0.9 % IV SOLN: INTRAVENOUS | Status: DC

## 2022-07-31 MED ORDER — ONDANSETRON HCL 4 MG/2ML IJ SOLN
INTRAMUSCULAR | Status: AC
  Filled 2022-07-31: qty 2

## 2022-07-31 MED ORDER — HEMOSTATIC AGENTS (NO CHARGE) OPTIME
TOPICAL | Status: DC | PRN
  Administered 2022-07-31 (×2): 1 via TOPICAL

## 2022-07-31 MED ORDER — FENTANYL CITRATE (PF) 250 MCG/5ML IJ SOLN
INTRAMUSCULAR | Status: AC
  Filled 2022-07-31: qty 5

## 2022-07-31 MED ORDER — MOMETASONE FURO-FORMOTEROL FUM 200-5 MCG/ACT IN AERO
2.0000 | INHALATION_SPRAY | Freq: Two times a day (BID) | RESPIRATORY_TRACT | Status: DC
  Administered 2022-08-02 – 2022-08-03 (×3): 2 via RESPIRATORY_TRACT
  Filled 2022-07-31 (×2): qty 8.8

## 2022-07-31 MED ORDER — EPHEDRINE 5 MG/ML INJ
INTRAVENOUS | Status: AC
  Filled 2022-07-31: qty 5

## 2022-07-31 MED ORDER — OXYCODONE-ACETAMINOPHEN 5-325 MG PO TABS: 1.0000 | ORAL_TABLET | ORAL | Status: DC | PRN

## 2022-07-31 MED ORDER — ONDANSETRON HCL 4 MG PO TABS: 4.0000 mg | ORAL_TABLET | ORAL | Status: DC | PRN

## 2022-07-31 MED ORDER — HYDROMORPHONE HCL 1 MG/ML IJ SOLN
INTRAMUSCULAR | Status: AC
  Filled 2022-07-31: qty 0.5

## 2022-07-31 MED ORDER — CHLORHEXIDINE GLUCONATE CLOTH 2 % EX PADS
6.0000 | MEDICATED_PAD | Freq: Every day | CUTANEOUS | Status: DC
  Administered 2022-08-01 – 2022-08-02 (×2): 6 via TOPICAL

## 2022-07-31 MED ORDER — CHLORHEXIDINE GLUCONATE 0.12 % MT SOLN
OROMUCOSAL | Status: AC
  Administered 2022-07-31: 15 mL via OROMUCOSAL
  Filled 2022-07-31: qty 15

## 2022-07-31 MED ORDER — ROCURONIUM BROMIDE 10 MG/ML (PF) SYRINGE
PREFILLED_SYRINGE | INTRAVENOUS | Status: DC | PRN
  Administered 2022-07-31: 100 mg via INTRAVENOUS
  Administered 2022-07-31 (×2): 50 mg via INTRAVENOUS

## 2022-07-31 MED ORDER — LABETALOL HCL 5 MG/ML IV SOLN
INTRAVENOUS | Status: AC
  Administered 2022-07-31: 20 mg via INTRAVENOUS
  Filled 2022-07-31: qty 4

## 2022-07-31 MED ORDER — CHLORHEXIDINE GLUCONATE 0.12 % MT SOLN: 15.0000 mL | Freq: Once | OROMUCOSAL | Status: AC

## 2022-07-31 MED ORDER — SUGAMMADEX SODIUM 200 MG/2ML IV SOLN
INTRAVENOUS | Status: DC | PRN
  Administered 2022-07-31: 200 mg via INTRAVENOUS

## 2022-07-31 MED ORDER — HYDROCODONE-ACETAMINOPHEN 5-325 MG PO TABS: 1.0000 | ORAL_TABLET | ORAL | Status: DC | PRN

## 2022-07-31 MED ORDER — FENTANYL CITRATE (PF) 250 MCG/5ML IJ SOLN
INTRAMUSCULAR | Status: DC | PRN
  Administered 2022-07-31: 50 ug via INTRAVENOUS
  Administered 2022-07-31: 100 ug via INTRAVENOUS
  Administered 2022-07-31 (×2): 50 ug via INTRAVENOUS

## 2022-07-31 MED ORDER — CEFAZOLIN SODIUM-DEXTROSE 2-4 GM/100ML-% IV SOLN
2.0000 g | INTRAVENOUS | Status: AC
  Administered 2022-07-31: 2 g via INTRAVENOUS

## 2022-07-31 MED ORDER — ACETAMINOPHEN 650 MG RE SUPP: 650.0000 mg | RECTAL | Status: DC | PRN

## 2022-07-31 MED ORDER — DEXAMETHASONE SODIUM PHOSPHATE 10 MG/ML IJ SOLN
INTRAMUSCULAR | Status: AC
  Filled 2022-07-31: qty 1

## 2022-07-31 MED ORDER — PROMETHAZINE HCL 25 MG PO TABS: 12.5000 mg | ORAL_TABLET | ORAL | Status: DC | PRN

## 2022-07-31 MED ORDER — BUPIVACAINE HCL (PF) 0.5 % IJ SOLN
INTRAMUSCULAR | Status: AC
  Filled 2022-07-31: qty 30

## 2022-07-31 MED ORDER — HYDROMORPHONE HCL 1 MG/ML IJ SOLN
INTRAMUSCULAR | Status: DC | PRN
  Administered 2022-07-31: .5 mg via INTRAVENOUS

## 2022-07-31 MED ORDER — ROSUVASTATIN CALCIUM 20 MG PO TABS
20.0000 mg | ORAL_TABLET | Freq: Every day | ORAL | Status: DC
  Administered 2022-08-01 – 2022-08-03 (×3): 20 mg via ORAL
  Filled 2022-07-31 (×3): qty 1

## 2022-07-31 MED ORDER — ALBUTEROL SULFATE HFA 108 (90 BASE) MCG/ACT IN AERS: 1.0000 | INHALATION_SPRAY | RESPIRATORY_TRACT | Status: DC | PRN

## 2022-07-31 MED ORDER — ALBUMIN HUMAN 5 % IV SOLN: INTRAVENOUS | Status: DC | PRN

## 2022-07-31 MED ORDER — PANTOPRAZOLE SODIUM 40 MG IV SOLR
40.0000 mg | Freq: Every day | INTRAVENOUS | Status: DC
  Administered 2022-07-31 – 2022-08-02 (×3): 40 mg via INTRAVENOUS
  Filled 2022-07-31 (×3): qty 10

## 2022-07-31 MED ORDER — CEFAZOLIN SODIUM-DEXTROSE 2-4 GM/100ML-% IV SOLN
INTRAVENOUS | Status: AC
  Filled 2022-07-31: qty 100

## 2022-07-31 MED ORDER — NICARDIPINE HCL IN NACL 20-0.86 MG/200ML-% IV SOLN
3.0000 mg/h | INTRAVENOUS | Status: DC
  Administered 2022-07-31: 5 mg/h via INTRAVENOUS
  Administered 2022-07-31: 10 mg/h via INTRAVENOUS
  Administered 2022-07-31: 5 mg/h via INTRAVENOUS
  Administered 2022-08-01 (×3): 15 mg/h via INTRAVENOUS
  Administered 2022-08-01: 7.5 mg/h via INTRAVENOUS
  Administered 2022-08-01 (×2): 10 mg/h via INTRAVENOUS
  Administered 2022-08-01: 15 mg/h via INTRAVENOUS
  Administered 2022-08-01: 12.5 mg/h via INTRAVENOUS
  Administered 2022-08-01: 7.5 mg/h via INTRAVENOUS
  Administered 2022-08-01: 10 mg/h via INTRAVENOUS
  Filled 2022-07-31: qty 200
  Filled 2022-07-31: qty 400
  Filled 2022-07-31 (×2): qty 200
  Filled 2022-07-31: qty 400
  Filled 2022-07-31 (×3): qty 200
  Filled 2022-07-31: qty 400

## 2022-07-31 SURGICAL SUPPLY — 92 items
APL SKNCLS STERI-STRIP NONHPOA (GAUZE/BANDAGES/DRESSINGS)
BAG COUNTER SPONGE SURGICOUNT (BAG) ×1 IMPLANT
BAG SPNG CNTER NS LX DISP (BAG) ×1
BAND INSRT 18 STRL LF DISP RB (MISCELLANEOUS) ×2
BAND RUBBER #18 3X1/16 STRL (MISCELLANEOUS) ×2 IMPLANT
BASKET BONE COLLECTION (BASKET) IMPLANT
BENZOIN TINCTURE PRP APPL 2/3 (GAUZE/BANDAGES/DRESSINGS) IMPLANT
BIT DRILL WIRE PASS 1.3MM (BIT) IMPLANT
BLADE SAW GIGLI 16 STRL (MISCELLANEOUS) IMPLANT
BLADE SURG 15 STRL LF DISP TIS (BLADE) ×1 IMPLANT
BLADE SURG 15 STRL SS (BLADE) ×1
BLADE ULTRA TIP 2M (BLADE) IMPLANT
BNDG CMPR 75X41 PLY HI ABS (GAUZE/BANDAGES/DRESSINGS) ×1
BNDG GAUZE DERMACEA FLUFF 4 (GAUZE/BANDAGES/DRESSINGS) IMPLANT
BNDG GZE DERMACEA 4 6PLY (GAUZE/BANDAGES/DRESSINGS) ×2
BNDG STRETCH 4X75 STRL LF (GAUZE/BANDAGES/DRESSINGS) IMPLANT
BUR ACORN 6.0 PRECISION (BURR) ×1 IMPLANT
BUR MATCHSTICK NEURO 3.0 LAGG (BURR) IMPLANT
BUR ROUND FLUTED 4 SOFT TCH (BURR) IMPLANT
BUR ROUND FLUTED 5 RND (BURR) IMPLANT
BUR SPIRAL ROUTER 2.3 (BUR) IMPLANT
CABLE BIPOLOR RESECTION CORD (MISCELLANEOUS) ×1 IMPLANT
CANISTER SUCT 3000ML PPV (MISCELLANEOUS) ×1 IMPLANT
CLIP VESOCCLUDE MED 6/CT (CLIP) IMPLANT
CNTNR URN SCR LID CUP LEK RST (MISCELLANEOUS) IMPLANT
CONT SPEC 4OZ STRL OR WHT (MISCELLANEOUS) ×1
COVER BURR HOLE 7 (Orthopedic Implant) IMPLANT
COVERAGE SUPPORT O-ARM STEALTH (MISCELLANEOUS) ×1 IMPLANT
DRAPE MICROSCOPE SLANT 54X150 (MISCELLANEOUS) ×1 IMPLANT
DRAPE NEUROLOGICAL W/INCISE (DRAPES) ×1 IMPLANT
DRAPE SHEET LG 3/4 BI-LAMINATE (DRAPES) IMPLANT
DRAPE WARM FLUID 44X44 (DRAPES) ×1 IMPLANT
DRILL WIRE PASS 1.3MM (BIT)
DRSG ADAPTIC 3X8 NADH LF (GAUZE/BANDAGES/DRESSINGS) ×1 IMPLANT
DRSG TELFA 3X8 NADH STRL (GAUZE/BANDAGES/DRESSINGS) ×1 IMPLANT
DURAPREP 6ML APPLICATOR 50/CS (WOUND CARE) ×1 IMPLANT
ELECT REM PT RETURN 9FT ADLT (ELECTROSURGICAL) ×1
ELECTRODE REM PT RTRN 9FT ADLT (ELECTROSURGICAL) ×1 IMPLANT
EVACUATOR SILICONE 100CC (DRAIN) IMPLANT
FEE COVERAGE SUPPORT O-ARM (MISCELLANEOUS) ×1 IMPLANT
GAUZE 4X4 16PLY ~~LOC~~+RFID DBL (SPONGE) IMPLANT
GAUZE SPONGE 4X4 12PLY STRL (GAUZE/BANDAGES/DRESSINGS) IMPLANT
GLOVE BIO SURGEON STRL SZ7.5 (GLOVE) IMPLANT
GLOVE BIOGEL PI IND STRL 7.0 (GLOVE) IMPLANT
GLOVE BIOGEL PI IND STRL 7.5 (GLOVE) ×2 IMPLANT
GLOVE ECLIPSE 7.0 STRL STRAW (GLOVE) ×2 IMPLANT
GLOVE EXAM NITRILE XL STR (GLOVE) IMPLANT
GOWN STRL REUS W/ TWL LRG LVL3 (GOWN DISPOSABLE) ×2 IMPLANT
GOWN STRL REUS W/ TWL XL LVL3 (GOWN DISPOSABLE) IMPLANT
GOWN STRL REUS W/TWL 2XL LVL3 (GOWN DISPOSABLE) IMPLANT
GOWN STRL REUS W/TWL LRG LVL3 (GOWN DISPOSABLE) ×3
GOWN STRL REUS W/TWL XL LVL3 (GOWN DISPOSABLE) ×3
HEMOSTAT POWDER KIT SURGIFOAM (HEMOSTASIS) ×1 IMPLANT
HEMOSTAT SURGICEL 2X14 (HEMOSTASIS) ×1 IMPLANT
HOOK DURA (MISCELLANEOUS) ×1 IMPLANT
KIT BASIN OR (CUSTOM PROCEDURE TRAY) ×1 IMPLANT
KIT DRAIN CSF ACCUDRAIN (MISCELLANEOUS) IMPLANT
KIT TURNOVER KIT B (KITS) ×1 IMPLANT
MARKER SKIN DUAL TIP RULER LAB (MISCELLANEOUS) IMPLANT
MARKER SPHERE PSV REFLC NDI (MISCELLANEOUS) ×3 IMPLANT
NEEDLE HYPO 22GX1.5 SAFETY (NEEDLE) ×1 IMPLANT
NS IRRIG 1000ML POUR BTL (IV SOLUTION) ×1 IMPLANT
PACK BATTERY CMF DISP FOR DVR (ORTHOPEDIC DISPOSABLE SUPPLIES) IMPLANT
PACK CRANIOTOMY CUSTOM (CUSTOM PROCEDURE TRAY) ×1 IMPLANT
PAD ARMBOARD 7.5X6 YLW CONV (MISCELLANEOUS) ×1 IMPLANT
PATTIES SURGICAL .25X.25 (GAUZE/BANDAGES/DRESSINGS) IMPLANT
PATTIES SURGICAL .5 X.5 (GAUZE/BANDAGES/DRESSINGS) IMPLANT
PATTIES SURGICAL .5 X3 (DISPOSABLE) IMPLANT
PATTIES SURGICAL 1/4 X 3 (GAUZE/BANDAGES/DRESSINGS) IMPLANT
PATTIES SURGICAL 1X1 (DISPOSABLE) IMPLANT
PIN MAYFIELD SKULL DISP (PIN) IMPLANT
PLATE CRANIAL 4H UNI NEURO III (Plate) IMPLANT
PLATE UNIV CMF 16 2H (Plate) IMPLANT
SCREW UNIII AXS SD 1.5X4 (Screw) IMPLANT
SPIKE FLUID TRANSFER (MISCELLANEOUS) ×1 IMPLANT
SPONGE NEURO XRAY DETECT 1X3 (DISPOSABLE) IMPLANT
SPONGE SURGIFOAM ABS GEL 100 (HEMOSTASIS) ×1 IMPLANT
SPONGE SURGIFOAM ABS GEL 100C (HEMOSTASIS) IMPLANT
STAPLER VISISTAT 35W (STAPLE) ×1 IMPLANT
STOCKINETTE 6  STRL (DRAPES) ×1
STOCKINETTE 6 STRL (DRAPES) ×1 IMPLANT
SUT ETHILON 3 0 FSL (SUTURE) IMPLANT
SUT NURALON 4 0 TR CR/8 (SUTURE) ×2 IMPLANT
SUT VIC AB 0 CT1 18XCR BRD8 (SUTURE) ×2 IMPLANT
SUT VIC AB 0 CT1 8-18 (SUTURE) ×2
SUT VIC AB 3-0 SH 8-18 (SUTURE) ×2 IMPLANT
TAPE CLOTH 1X10 TAN NS (GAUZE/BANDAGES/DRESSINGS) ×1 IMPLANT
TOWEL GREEN STERILE (TOWEL DISPOSABLE) ×1 IMPLANT
TOWEL GREEN STERILE FF (TOWEL DISPOSABLE) ×1 IMPLANT
TRAY FOLEY MTR SLVR 16FR STAT (SET/KITS/TRAYS/PACK) IMPLANT
UNDERPAD 30X36 HEAVY ABSORB (UNDERPADS AND DIAPERS) IMPLANT
WATER STERILE IRR 1000ML POUR (IV SOLUTION) ×1 IMPLANT

## 2022-07-31 NOTE — Anesthesia Procedure Notes (Signed)
Arterial Line Insertion Start/End12/07/2022 8:05 AM, 07/31/2022 8:15 AM Performed by: Janace Litten, CRNA  Patient location: OR. Preanesthetic checklist: patient identified, IV checked, site marked, risks and benefits discussed, surgical consent, monitors and equipment checked, pre-op evaluation, timeout performed and anesthesia consent Patient sedated Left, radial was placed Catheter size: 20 G Hand hygiene performed  and maximum sterile barriers used   Attempts: 1 Procedure performed without using ultrasound guided technique. Following insertion, dressing applied and Biopatch. Post procedure assessment: normal and unchanged  Patient tolerated the procedure well with no immediate complications.

## 2022-07-31 NOTE — Progress Notes (Signed)
Pt arrived to [id number]. Pt's sister and son at the bedside. Pt alert, orientedx2, vitals taken. Pt given Labetolol for high bp, vitals otherwise WNL.   Newman Nip, RN

## 2022-07-31 NOTE — Op Note (Signed)
NEUROSURGERY OPERATIVE NOTE   PREOP DIAGNOSIS:  Left temporal arteriovenous malformation   POSTOP DIAGNOSIS: Same  PROCEDURE: Stereotactic left temporal craniotomy for resection of arteriovenous malformation Use of intraoperative microscope for microdissection  SURGEON: Dr. [name], MD  ASSISTANT: Dr. Elwin Sleight, MD  ANESTHESIA: General Endotracheal  EBL: 600cc  SPECIMENS: Left temporal arteriovenous malformation for permanent pathology  DRAINS: None  COMPLICATIONS: None immediate  CONDITION: Hemodynamically stable to postanesthesia care unit  HISTORY: [name] Giles is a 66 y.o. female who initially presented to the hospital nearly 6 months ago with speech difficulty and was found to have a large left temporal hematoma.  CT angiogram suggest an underlying arteriovenous malformation.  After she made a good recovery, she underwent outpatient diagnostic cerebral angiogram confirming the presence of a anterior left temporal arteriovenous malformation.  Given the previous hemorrhage and location of the lesion, surgical resection was recommended.  The risks, benefits, and alternatives to the surgery were all reviewed in detail with the patient and her sister.  After all questions were answered informed consent was obtained and witnessed.  PROCEDURE IN DETAIL: The patient was brought to the operating room. After induction of general anesthesia, the patient was positioned on the operative table in the [place] head holder in the supine position. All pressure points were meticulously padded.  Preoperative stereotactic MRI scan was then Co. registered with surface markers until I good accuracy was achieved.  A standard reverse question-mark incision was then planned out to allow access to the margins of the underlying arteriovenous malformation.  Skin incision was then marked out and prepped and draped in the usual sterile fashion.  After timeout was conducted, the incision was  infiltrated with local anesthetic with epinephrine.  Incision was then made sharply and carried down through the galea.  Raney clips were applied for skin hemostasis.  Bovie electrocautery was used to dissect through the periosteum and the temporalis muscle and fascia.  A single piece myocutaneous flap was then elevated and reflected anteriorly.  The exposure was taken down to the level of the root of the zygoma.  The stereotactic system was again used to plan out some craniotomy bur holes which would allow access to the temporal pole and middle fossa floor, as well as the posterior margin of the AVM and the sylvian fissure.  Was then taken to create the bur holes preserving the underlying dura.  Bur holes were then connected with the craniotome and a single piece temporal craniotomy flap was elevated.  Hemostasis on the bone edges was secured with bone wax.  Hemostasis on the epidural plane was secured with bipolar electrocautery.  Leksell rongeur's were then used to extend the craniectomy anteriorly and inferiorly in order to allow access to the temporal pole and the floor of the middle fossa.  The dura was then opened in a U-shaped fashion based superiorly in order to fully expose the arteriovenous malformation.  I was able to identify a portion of the AVM on the temporal surface, apparently on the middle temporal gyrus.  At this point the microscope was draped sterilely and brought in the field and the remainder of the AVM resection was done under the microscope using microdissection technique.  The bipolar electrocautery was used to create a corticotomy posteriorly and superiorly over the AVM.  The bipolar was then used to dissect through the white matter on the edge of the arteriovenous malformation until the previous hematoma cavity was identified and noted to be sustained golden hemosiderin  color.  Dissection was then carried anteriorly to delineate the anterior margin of the AVM, and serially it was  carried posteriorly to delineate the posterior margin.  At this point, the bipolar electrocautery was used to ligate the posteriorly directed draining vein on the inferior surface of the temporal lobe.  This did cause some friability of the AVM.  The bipolar was used to further delineate the more medial inferior margin of the AVM.  Finally, the bipolar was used to dissect the medial margin of the AVM and it was removed in a single piece and sent for permanent pathology.  The resection cavity was inspected and appeared to be primarily temporal white matter and underlying hematoma without any residual AVM.  No active bleeding was identified.  This point the wound was irrigated with normal saline.  Again, no active bleeding was identified.  The dura was then reapproximated with interrupted 4-0 Nurolon stitches.  A large piece of Gelfoam was placed over the dural surface.  The bone flap was then replaced and plated with standard titanium plates and screws.  Wound was again irrigated.  The temporalis muscle and fascia was then reapproximated with interrupted 0 Vicryl stitches.  Briant Cedar was reapproximated with interrupted 3-0 Vicryl stitches and the skin was closed with staples.  Patient was then removed from the [place] head holder and sterile dressing with head wrap was applied.  At the end of the case all sponge, needle, and instrument counts were correct. The patient was then transferred to the stretcher, extubated, and taken to the post-anesthesia care unit in stable hemodynamic condition.   [name], MD [facility]

## 2022-07-31 NOTE — Transfer of Care (Signed)
Immediate Anesthesia Transfer of Care Note  Patient: [name] Giles  Procedure(s) Performed: STEREOTACTIC LEFT TEMPORAL CRANI FOR RESECTION OF ARTERIAL VENOUS MALFORMATION (Left) APPLICATION OF CRANIAL NAVIGATION (Left)  Patient Location: PACU  Anesthesia Type:General  Level of Consciousness: drowsy and patient cooperative  Airway & Oxygen Therapy: Patient Spontanous Breathing and Patient connected to nasal cannula oxygen  Post-op Assessment: Report given to RN and Post -op Vital signs reviewed and stable  Post vital signs: Reviewed and stable  Last Vitals:  Vitals Value Taken Time  BP 132/56 07/31/22 1215  Temp 36.4 C 07/31/22 1215  Pulse 88 07/31/22 1219  Resp 13 07/31/22 1219  SpO2 98 % 07/31/22 1219  Vitals shown include unvalidated device data.  Last Pain:  Vitals:   07/31/22 1215  PainSc: 0-No pain         Complications: No notable events documented.

## 2022-07-31 NOTE — H&P (Signed)
Chief Complaint   AVM  History of Present Illness  [name] Giles is a 66 y.o. female previously admitted to the hospital with left temporal hemorrhage with suggestion of a underlying arteriovenous malformation. She was unable to tolerate diagnostic cerebral angiogram at the time. She has made a very good neurologic recovery with a mild residual aphasia. She has recently undergone diagnostic cerebral angiogram revealing the underlying AVM. After lengthy discussion of treatment options in the office, she presents today for resection of AVM.  Past Medical History   Past Medical History:  Diagnosis Date   Asthma    Medical history non-contributory     Past Surgical History   Past Surgical History:  Procedure Laterality Date   COLONOSCOPY     COLONOSCOPY N/A 04/06/2013   Procedure: COLONOSCOPY;  Surgeon: [name] Dolin, MD;  Location: [facility];  Service: Endoscopy;  Laterality: N/A;  9:30 AM   ENDOMETRIAL ABLATION     IR ANGIO EXTERNAL CAROTID SEL EXT CAROTID UNI L MOD SED  07/03/2022   IR ANGIO INTRA EXTRACRAN SEL INTERNAL CAROTID BILAT MOD SED  04/04/2022   IR ANGIO INTRA EXTRACRAN SEL INTERNAL CAROTID BILAT MOD SED  07/03/2022   IR ANGIO VERTEBRAL SEL VERTEBRAL UNI L MOD SED  07/03/2022   IR US GUIDE VASC ACCESS RIGHT  04/04/2022    Social History   Social History   Tobacco Use   Smoking status: Former    Packs/day: 1.00    Years: 30.00    Total pack years: 30.00    Types: Cigarettes    Quit date: 03/29/2022    Years since quitting: 0.3   Smokeless tobacco: Never  Vaping Use   Vaping Use: Never used  Substance Use Topics   Alcohol use: Yes    Comment: wine occasionally   Drug use: No    Medications   Prior to Admission medications   Medication Sig Start Date End Date Taking? Authorizing Provider  budesonide-formoterol (SYMBICORT) 160-4.5 MCG/ACT inhaler INHALE 2 PUFFS BY MOUTH EVERY DAY 05/15/22  Yes Raulkar, Clide Deutscher, MD  rosuvastatin (CRESTOR) 20 MG  tablet Take 1 tablet (20 mg total) by mouth daily. 07/30/22  Yes Raulkar, Clide Deutscher, MD  albuterol (VENTOLIN HFA) 108 (90 Base) MCG/ACT inhaler Inhale 1-2 puffs into the lungs every 4 (four) hours as needed for shortness of breath. 04/26/22   Angiulli, Lavon Paganini, PA-C  bethanechol (URECHOLINE) 5 MG tablet Take 1 tablet (5 mg total) by mouth 2 (two) times daily. Patient not taking: Reported on 07/25/2022 04/26/22   Angiulli, Lavon Paganini, PA-C  Ensure (ENSURE) DRINK 1-CAN (237ML) BY MOUTH TWICE DAILY FOR NUTRITION Patient not taking: Reported on 07/25/2022 05/03/22   Raulkar, Clide Deutscher, MD  melatonin 3 MG TABS tablet Take 1 tablet (3 mg total) by mouth at bedtime. Patient not taking: Reported on 07/25/2022 04/26/22   Angiulli, Lavon Paganini, PA-C  Multiple Vitamin (MULTIVITAMIN WITH MINERALS) TABS tablet Take 1 tablet by mouth daily. Patient not taking: Reported on 07/25/2022 04/17/22   Pokhrel, Corrie Mckusick, MD  senna-docusate (SENOKOT-S) 8.6-50 MG tablet Take 1 tablet by mouth 2 (two) times daily. 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 female with previously ruptured left temporal AVM  Plan  - will plan on left temporal craniotomy for resection of AVM  I have reviewed the indications for the procedure as well as the details of the procedure and the expected postoperative course and recovery at length with the patient and her sister in the office. We have also  reviewed in detail the risks, benefits, and alternatives to the procedure. All questions were answered and [name] Giles provided informed consent to proceed.  [name] Lose, MD [name] Giles

## 2022-07-31 NOTE — Anesthesia Procedure Notes (Signed)
Procedure Name: Intubation Date/Time: 07/31/2022 8:12 AM  Performed by: Lance Coon, CRNAPre-anesthesia Checklist: Patient identified, Emergency Drugs available, Suction available, Patient being monitored and Timeout performed Patient Re-evaluated:Patient Re-evaluated prior to induction Oxygen Delivery Method: Circle system utilized Preoxygenation: Pre-oxygenation with 100% oxygen Induction Type: IV induction Ventilation: Mask ventilation without difficulty Laryngoscope Size: Miller and 3 Grade View: Grade I Tube type: Oral Tube size: 7.0 mm Number of attempts: 1 Airway Equipment and Method: Stylet Placement Confirmation: ETT inserted through vocal cords under direct vision, positive ETCO2 and breath sounds checked- equal and bilateral Secured at: 21 cm Tube secured with: Tape Dental Injury: Teeth and Oropharynx as per pre-operative assessment

## 2022-07-31 NOTE — Progress Notes (Addendum)
Pt continuing to have high bp's despite receiving PRN IV Labetololx2. Pt also in pain, but does not have orders for IV pain meds. Dr. Kathyrn Sheriff was notified. Verbal orders for PRN IV Morphine and systolic blood pressure parameters <140.   1630 - After receiving Morphine and a third dose of IV Labetolol, pt's systolic blood pressure remains in the 150's on the A-line. Pt's cuff pressure 128/54, but ART waveform does look WNL. Verbal orders from Dr. Kathyrn Sheriff for Cardene drip.   Justice Rocher, RN

## 2022-08-01 LAB — BASIC METABOLIC PANEL
Anion gap: 7 (ref 5–15)
BUN: 6 mg/dL — ABNORMAL LOW (ref 8–23)
CO2: 23 mmol/L (ref 22–32)
Calcium: 7.8 mg/dL — ABNORMAL LOW (ref 8.9–10.3)
Chloride: 112 mmol/L — ABNORMAL HIGH (ref 98–111)
Creatinine, Ser: 0.49 mg/dL (ref 0.44–1.00)
GFR, Estimated: >60 mL/min (ref >60)
Glucose, Bld: 118 mg/dL — ABNORMAL HIGH (ref 70–99)
Potassium: 3.4 mmol/L — ABNORMAL LOW (ref 3.5–5.1)
Sodium: 142 mmol/L (ref 135–145)

## 2022-08-01 LAB — SURGICAL PATHOLOGY

## 2022-08-01 NOTE — Evaluation (Signed)
Physical Therapy Evaluation Patient Details Name: [name] Giles MRN: [id number] DOB: [birth year] Today's Date: 08/01/2022  History of Present Illness  66 y.o. female presents to [facility] hospital on 07/30/2022 for scheduled L temporal crani and AVM resection. PMH includes asthma.  Clinical Impression  Pt presents to PT with deficits in functional mobility, balance, gait, cognition, and communication. Pt demonstrates expressive difficulties at this time. 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 Pt demonstrates increased sway in standing but is able to perform lower body washing when standing at bedside. Pt declines ambulation away from the bed due to fatigue despite PT encouragement. Pt will benefit from aggressive mobilization tomorrow in an effort to further assess gait and balance quality.     Recommendations for follow up therapy are one component of a multi-disciplinary discharge planning process, led by the attending physician.  Recommendations may be updated based on patient status, additional functional criteria and insurance authorization.  Follow Up Recommendations Outpatient PT      Assistance Recommended at Discharge Frequent or constant Supervision/Assistance  Patient can return home with the following  A little help with walking and/or transfers;A little help with bathing/dressing/bathroom;Direct supervision/assist for medications management;Direct supervision/assist for financial management;Assist for transportation;Help with stairs or ramp for entrance;Assistance with cooking/housework    Equipment Recommendations None recommended by PT  Recommendations for Other Services       Functional Status Assessment Patient has had a recent decline in their functional status and demonstrates the ability to make significant improvements in function in a reasonable and predictable amount of  time.     Precautions / Restrictions Precautions Precautions: Fall Precaution Comments: arterial line Restrictions Weight Bearing Restrictions: No      Mobility  Bed Mobility Overal bed mobility: Needs Assistance Bed Mobility: Supine to Sit, Sit to Supine     Supine to sit: Supervision, HOB elevated Sit to supine: Supervision, HOB elevated   General bed mobility comments: line management    Transfers Overall transfer level: Needs assistance Equipment used: None Transfers: Sit to/from Stand Sit to Stand: Min guard                Ambulation/Gait Ambulation/Gait assistance: Min guard           Pre-gait activities: pt taking steps at edge of bed when performing lower body bathing. pt refuses to ambulate away from bedside at this time    Stairs            Wheelchair Mobility    Modified Rankin (Stroke Patients Only)       Balance Overall balance assessment: Needs assistance Sitting-balance support: No upper extremity supported, Feet supported Sitting balance-Leahy Scale: Good     Standing balance support: No upper extremity supported, During functional activity Standing balance-Leahy Scale: Fair                               Pertinent Vitals/Pain Pain Assessment Pain Assessment: No/denies pain    Home Living Family/patient expects to be discharged to:: Private residence Living Arrangements: Children (son stays with pt at night) Available Help at Discharge: Family;Friend(s);Neighbor;Available 24 hours/day (24/7 initially per pt's brother) Type of Home: House Home Access: Stairs to enter Entrance Stairs-Rails: Psychiatric nurse of Steps: 4   Home Layout: One level Home Equipment: Conservation officer, nature (2 wheels)      Prior Function Prior  Level of Function : Independent/Modified Independent             Mobility Comments: mobilizes independently ADLs Comments: son assists with some of the cooking and cleaning,  along with tranportation farther distances     Hand Dominance   Dominant Hand: Right    Extremity/Trunk Assessment   Upper Extremity Assessment Upper Extremity Assessment: Overall WFL for tasks assessed    Lower Extremity Assessment Lower Extremity Assessment: Overall WFL for tasks assessed    Cervical / Trunk Assessment Cervical / Trunk Assessment: Normal  Communication   Communication: Expressive difficulties  Cognition Arousal/Alertness: Awake/alert Behavior During Therapy: Impulsive Overall Cognitive Status: Impaired/Different from baseline Area of Impairment: Attention, Memory, Safety/judgement, Awareness, Problem solving                   Current Attention Level: Selective Memory: Decreased short-term memory   Safety/Judgement: Decreased awareness of safety Awareness: Emergent Problem Solving: Slow processing, Difficulty sequencing          General Comments General comments (skin integrity, edema, etc.): VSS on RA    Exercises     Assessment/Plan    PT Assessment Patient needs continued PT services  PT Problem List Decreased activity tolerance;Decreased balance;Decreased mobility;Decreased cognition;Decreased safety awareness       PT Treatment Interventions DME instruction;Gait training;Functional mobility training;Stair training;Therapeutic activities;Balance training;Neuromuscular re-education;Cognitive remediation;Patient/family education    PT Goals (Current goals can be found in the Care Plan section)  Acute Rehab PT Goals Patient Stated Goal: to return to independence PT Goal Formulation: With patient/family Time For Goal Achievement: 08/15/22 Potential to Achieve Goals: Good Additional Goals Additional Goal #1: Pt will score >19/24 on the DGI to indicate a reduced risk for falls    Frequency Min 3X/week     Co-evaluation               AM-PAC PT "6 Clicks" Mobility  Outcome Measure Help needed turning from your back to  your side while in a flat bed without using bedrails?: A Little Help needed moving from lying on your back to sitting on the side of a flat bed without using bedrails?: A Little Help needed moving to and from a bed to a chair (including a wheelchair)?: A Little Help needed standing up from a chair using your arms (e.g., wheelchair or bedside chair)?: A Little Help needed to walk in hospital room?: A Little Help needed climbing 3-5 steps with a railing? : Total 6 Click Score: 16    End of Session   Activity Tolerance: Patient tolerated treatment well Patient left: in bed;with call bell/phone within reach;with chair alarm set;with family/visitor present;with SCD's reapplied Nurse Communication: Mobility status PT Visit Diagnosis: Unsteadiness on feet (R26.81);Other symptoms and signs involving the nervous system (R29.898)    Time: 3614-4315 PT Time Calculation (min) (ACUTE ONLY): 26 min   Charges:   PT Evaluation $PT Eval Low Complexity: [place], PT, DPT Acute Rehabilitation Office [phone]   Zenaida Niece 08/01/2022, 5:29 PM

## 2022-08-01 NOTE — Progress Notes (Signed)
  NEUROSURGERY PROGRESS NOTE   Pt seen and examined. No issues overnight.   EXAM: Temp:  [97.4 F (36.3 C)-99.6 F (37.6 C)] 99.2 F (37.3 C) (12/13 0800) Pulse Rate:  [63-89] 85 (12/13 1000) Resp:  [10-22] 20 (12/13 1000) BP: (71-152)/(45-107) 91/71 (12/13 1000) SpO2:  [89 %-100 %] 94 % (12/13 1000) Arterial Line BP: (108-191)/(43-79) 134/45 (12/13 1000) Intake/Output      12/12 0701 12/13 0700 12/13 0701 12/14 0700   I.V. (mL/kg) 4217.6 (67.4) 820.8 (13.1)   IV Piggyback 550.4    Total Intake(mL/kg) 4768 (76.2) 820.8 (13.1)   Urine (mL/kg/hr) 3410 (2.3) 550 (2.2)   Blood 600    Total Output 4010 550   Net +758 +270.8         Awake, alert, oriented Speech fluent, occasional paraphasic errors MAE good strength Wound c/d/i  LABS: Lab Results  Component Value Date   CREATININE 0.49 08/01/2022   BUN 6 (L) 08/01/2022   NA 142 08/01/2022   K 3.4 (L) 08/01/2022   CL 112 (H) 08/01/2022   CO2 23 08/01/2022   Lab Results  Component Value Date   WBC 6.9 07/03/2022   HGB 13.6 07/03/2022   HCT 40.7 07/03/2022   MCV 90.4 07/03/2022   PLT 324 07/03/2022    IMPRESSION: - 66 y.o. female POD#1 s/p left temporal crani for resection of previously ruptured AVM, appears to be at neurologic baseline  PLAN: - Keep A-line for today, cont SBP goal < 110mHg - Mobilize today - d/c foley - KVO fluids, encourage PO intake   [name] Lose MD [facility]

## 2022-08-01 NOTE — Anesthesia Postprocedure Evaluation (Signed)
Anesthesia Post Note  Patient: [name] Giles  Procedure(s) Performed: STEREOTACTIC LEFT TEMPORAL CRANI FOR RESECTION OF ARTERIAL VENOUS MALFORMATION (Left) APPLICATION OF CRANIAL NAVIGATION (Left)     Patient location during evaluation: PACU Anesthesia Type: General Level of consciousness: awake and alert Pain management: pain level controlled Vital Signs Assessment: post-procedure vital signs reviewed and stable Respiratory status: spontaneous breathing, nonlabored ventilation, respiratory function stable and patient connected to nasal cannula oxygen Cardiovascular status: blood pressure returned to baseline and stable Postop Assessment: no apparent nausea or vomiting Anesthetic complications: no   No notable events documented.  Last Vitals:  Vitals:   08/01/22 0600 08/01/22 0800  BP: (!) 103/53   Pulse: 73   Resp: 15   Temp:  37.3 C  SpO2: 95%     Last Pain:  Vitals:   08/01/22 0800  TempSrc: Oral  PainSc:                  March Rummage Liviah Cake

## 2022-08-02 ENCOUNTER — Inpatient Hospital Stay (HOSPITAL_COMMUNITY): Payer: Medicare Other

## 2022-08-02 HISTORY — PX: IR ANGIO INTRA EXTRACRAN SEL INTERNAL CAROTID UNI L MOD SED: IMG5361

## 2022-08-02 HISTORY — PX: IR ANGIO VERTEBRAL SEL VERTEBRAL UNI L MOD SED: IMG5367

## 2022-08-02 MED ORDER — FENTANYL CITRATE (PF) 100 MCG/2ML IJ SOLN
INTRAMUSCULAR | Status: AC
  Filled 2022-08-02: qty 2

## 2022-08-02 MED ORDER — HEPARIN SODIUM (PORCINE) 1000 UNIT/ML IJ SOLN
INTRAMUSCULAR | Status: AC | PRN
  Administered 2022-08-02: 2000 [IU] via INTRAVENOUS

## 2022-08-02 MED ORDER — FENTANYL CITRATE (PF) 100 MCG/2ML IJ SOLN
INTRAMUSCULAR | Status: AC | PRN
  Administered 2022-08-02: 25 ug via INTRAVENOUS

## 2022-08-02 MED ORDER — MIDAZOLAM HCL 2 MG/2ML IJ SOLN
INTRAMUSCULAR | Status: AC | PRN
  Administered 2022-08-02: 1 mg via INTRAVENOUS

## 2022-08-02 MED ORDER — LIDOCAINE HCL 1 % IJ SOLN
INTRAMUSCULAR | Status: AC
  Administered 2022-08-02: 10 mL via INTRADERMAL
  Filled 2022-08-02: qty 20

## 2022-08-02 MED ORDER — HEPARIN SODIUM (PORCINE) 1000 UNIT/ML IJ SOLN
INTRAMUSCULAR | Status: AC
  Filled 2022-08-02: qty 10

## 2022-08-02 MED ORDER — MIDAZOLAM HCL 2 MG/2ML IJ SOLN
INTRAMUSCULAR | Status: AC
  Filled 2022-08-02: qty 2

## 2022-08-02 MED ORDER — IOHEXOL 300 MG/ML  SOLN: 100.0000 mL | Freq: Once | INTRAMUSCULAR | Status: DC | PRN

## 2022-08-02 NOTE — TOC Initial Note (Signed)
Transition of Care [facility]) - Initial/Assessment Note    Patient Details  Name: [name] Giles MRN: [id number] Date of Birth: 31-Mar-1956  Transition of [facility]) CM/SW Contact:    Ella Bodo, RN Phone Number: 08/02/2022, 3:53 PM  Clinical Narrative:                 66 y.o. female presents to [facility] hospital on 07/30/2022 for scheduled L temporal crani and AVM resection.  PTA, pt independent and living at home with her son, who works.  Family at bedside state they can provide some assistance at discharge to provide frequent supervision while son works.  Patient active with [facility] for home health speech therapy.  Will likely need continued PT/OT in addition to ST at discharge; will notify MD for orders. Patient agreeable to plan.    Expected Discharge Plan: [place] Barriers to Discharge: Continued Medical Work up   Patient Goals and CMS Choice   CMS Medicare.gov Compare Post Acute Care list provided to:: Patient Choice offered to / list presented to : Patient  Expected Discharge Plan and Services Expected Discharge Plan: [place]   Discharge Planning Services: CM Consult Post Acute Care Choice: [place] arrangements for the past 2 months: Assisted Living Facility                           HH Arranged: PT, OT, Speech Therapy HH Agency: [place] Date [place]: 08/02/22 Time HH Agency Contacted: 1330 Representative spoke with at [place]: Otila Kluver  Prior Living Arrangements/Services Living arrangements for the past 2 months: [place]   Patient language and need for interpreter reviewed:: Yes Do you feel safe going back to the place where you live?: Yes      Need for Family Participation in Patient Care: Yes (Comment) Care giver support system in place?: Yes (comment)   Criminal Activity/Legal Involvement Pertinent to Current Situation/Hospitalization: No -  Comment as needed               Emotional Assessment Appearance:: Appears stated age Attitude/Demeanor/Rapport: Engaged Affect (typically observed): Accepting Orientation: : Oriented to Self, Oriented to Place, Oriented to  Time, Oriented to Situation      Admission diagnosis:  AVM (arteriovenous malformation) brain [Q28.2] 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   Gram-negative bacteremia    ICH (intracerebral hemorrhage) ([place]) 04/16/2022   Cerebral AVM    Delirium 04/11/2022   Essential hypertension 04/11/2022   Hyperlipidemia 04/11/2022   Intracerebral hemorrhage ([place]) 03/29/2022   PCP:  Redmond School, MD Pharmacy:   [place], [place] [address] [place] [address] Phone: [phone] Fax: [phone]  [place], [place] - [address] [place] [address] Phone: [phone] Fax: [phone]  [place] [facility] [place], [place] [address] Phone: [phone] Fax: [phone] Alfredo Batty, [place] [address] [place] [address] Phone: [phone] Fax: [phone]     Social Determinants of Health (SDOH) Interventions    Readmission Risk Interventions     No data to display         Reinaldo Raddle, RN, BSN  Trauma/Neuro  ICU Case Manager [phone]

## 2022-08-02 NOTE — Progress Notes (Signed)
Physical Therapy Treatment Patient Details Name: [name] Giles MRN: [id number] DOB: 03/04/1956 Today's Date: 08/02/2022   History of Present Illness 67 y.o. female presents to [facility] hospital on 07/30/2022 for scheduled L temporal crani and AVM resection. PMH includes asthma.    PT Comments    Patient progressing to hallway ambulation this session.  Still somewhat limited with balance holding IV pole, though no LOB noted.  Sister present and supportive.  Patient occasionally with word finding difficulty and needing continued education on safety for calling RN for assistance when up.  PT will continue to follow acutely.    Recommendations for follow up therapy are one component of a multi-disciplinary discharge planning process, led by the attending physician.  Recommendations may be updated based on patient status, additional functional criteria and insurance authorization.  Follow Up Recommendations  Outpatient PT     Assistance Recommended at Discharge Frequent or constant Supervision/Assistance  Patient can return home with the following A little help with walking and/or transfers;A little help with bathing/dressing/bathroom;Direct supervision/assist for medications management;Direct supervision/assist for financial management;Assist for transportation;Help with stairs or ramp for entrance;Assistance with cooking/housework   Equipment Recommendations  None recommended by PT    Recommendations for Other Services       Precautions / Restrictions Precautions Precautions: Fall Precaution Comments: arterial line Restrictions Weight Bearing Restrictions: No     Mobility  Bed Mobility Overal bed mobility: Needs Assistance Bed Mobility: Supine to Sit     Supine to sit: Supervision, HOB elevated     General bed mobility comments: line management    Transfers Overall transfer level: Needs assistance Equipment used: None, 1 person hand held assist Transfers: Sit to/from  Stand Sit to Stand: Min guard           General transfer comment: assist for lines, safety    Ambulation/Gait Ambulation/Gait assistance: Min guard Gait Distance (Feet): 200 Feet Assistive device: IV Pole Gait Pattern/deviations: Step-through pattern, Decreased stride length       General Gait Details: mild instability though kept only two fingers on IV pole during ambulation and no LOB   Stairs             Wheelchair Mobility    Modified Rankin (Stroke Patients Only)       Balance Overall balance assessment: Needs assistance   Sitting balance-Leahy Scale: Good     Standing balance support: No upper extremity supported Standing balance-Leahy Scale: Fair Standing balance comment: mild dynamic instability                            Cognition Arousal/Alertness: Awake/alert Behavior During Therapy: Impulsive Overall Cognitive Status: Impaired/Different from baseline Area of Impairment: Attention, Memory, Safety/judgement, Awareness, Problem solving                   Current Attention Level: Selective Memory: Decreased short-term memory   Safety/Judgement: Decreased awareness of safety Awareness: Emergent Problem Solving: Slow processing, Difficulty sequencing General Comments: sister present and reminding her to call for help to bathroom        Exercises      General Comments General comments (skin integrity, edema, etc.): SpO2 93% on RA up in chair, RN aware; soiled with urine despite pure wick so assisted for hygiene and gown change and pt toileted in bathroom performed toilet hygiene unaided in sitting      Pertinent Vitals/Pain Pain Assessment Pain Assessment: 0-10 Pain Score: 5  Pain  Location: L hand with art line and head on L side Pain Descriptors / Indicators: Discomfort, Headache Pain Intervention(s): Monitored during session, Limited activity within patient's tolerance    Home Living                           Prior Function            PT Goals (current goals can now be found in the care plan section) Progress towards PT goals: Progressing toward goals    Frequency    Min 3X/week      PT Plan Current plan remains appropriate    Co-evaluation              AM-PAC PT "6 Clicks" Mobility   Outcome Measure  Help needed turning from your back to your side while in a flat bed without using bedrails?: A Little Help needed moving from lying on your back to sitting on the side of a flat bed without using bedrails?: A Little Help needed moving to and from a bed to a chair (including a wheelchair)?: A Little Help needed standing up from a chair using your arms (e.g., wheelchair or bedside chair)?: A Little Help needed to walk in hospital room?: A Little Help needed climbing 3-5 steps with a railing? : Total 6 Click Score: 16    End of Session Equipment Utilized During Treatment: Gait belt Activity Tolerance: Patient tolerated treatment well Patient left: in chair;with call bell/phone within reach;with chair alarm set;with family/visitor present Nurse Communication: Mobility status PT Visit Diagnosis: Unsteadiness on feet (R26.81);Other symptoms and signs involving the nervous system (R29.898)     Time: 1245-8099 PT Time Calculation (min) (ACUTE ONLY): 32 min  Charges:  $Gait Training: 8-22 mins $Therapeutic Activity: 8-22 mins                     [name] Giles, PT Acute Rehabilitation Services Office:[phone] 08/02/2022    [name] Giles 08/02/2022, 11:45 AM

## 2022-08-02 NOTE — Progress Notes (Signed)
  NEUROSURGERY PROGRESS NOTE   Pt seen and examined. No issues overnight. Has had normal BP off cardene. Minimal HA today.  EXAM: Temp:  [98.5 F (36.9 C)-100.8 F (38.2 C)] 98.5 F (36.9 C) (12/14 1200) Pulse Rate:  [71-112] 94 (12/14 1200) Resp:  [12-24] 21 (12/14 1200) BP: (74-119)/(22-81) 99/22 (12/14 1200) SpO2:  [91 %-98 %] 93 % (12/14 1200) Arterial Line BP: (102-155)/(40-79) 109/79 (12/14 1200) Intake/Output      12/13 0701 12/14 0700 12/14 0701 12/15 0700   I.V. (mL/kg) 1519.2 (24.3)    IV Piggyback 200    Total Intake(mL/kg) 1719.2 (27.5)    Urine (mL/kg/hr) 2250 (1.5)    Blood     Total Output 2250    Net -530.8         Urine Occurrence 2 x 1 x    Awake, alert, oriented Speech fluent, occasional paraphasic errors MAE good strength Wound c/d/i  LABS: Lab Results  Component Value Date   CREATININE 0.49 08/01/2022   BUN 6 (L) 08/01/2022   NA 142 08/01/2022   K 3.4 (L) 08/01/2022   CL 112 (H) 08/01/2022   CO2 23 08/01/2022   Lab Results  Component Value Date   WBC 6.9 07/03/2022   HGB 13.6 07/03/2022   HCT 40.7 07/03/2022   MCV 90.4 07/03/2022   PLT 324 07/03/2022    IMPRESSION: - 66 y.o. 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

## 2022-08-02 NOTE — Brief Op Note (Signed)
  NEUROSURGERY BRIEF OPERATIVE  NOTE   PREOP DX: AVM  POSTOP DX: Same  PROCEDURE: Diagnostic cerebral angiogram  SURGEON: Dr. [name], MD  ANESTHESIA: IV Sedation with Local  APPROACH: Right trans-femoral  EBL: Minimal  SPECIMENS: None  COMPLICATIONS: None  CONDITION: Stable to recovery  FINDINGS (Full report in [facility]): 1. 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 No residual nidus or early venous drainage seen.   [name], MD [facility]

## 2022-08-02 NOTE — Progress Notes (Signed)
OT Cancellation Note  Patient Details Name: [name] Giles MRN: [id number] DOB: [birth year]   Cancelled Treatment:    Reason Eval/Treat Not Completed: Active bedrest order post angiogram.  OT will continue efforts as appropriate.    [place] Staffa D Clark Cuff 08/02/2022, 2:59 PM 08/02/2022  RP, OTR/L  Acute Rehabilitation Services  Office:  [phone]

## 2022-08-03 MED ORDER — LEVETIRACETAM 500 MG PO TABS: 500.0000 mg | ORAL_TABLET | Freq: Two times a day (BID) | ORAL | 0 refills | Status: AC

## 2022-08-03 MED ORDER — LEVETIRACETAM 500 MG PO TABS
500.0000 mg | ORAL_TABLET | Freq: Two times a day (BID) | ORAL | Status: DC
  Administered 2022-08-03: 500 mg via ORAL
  Filled 2022-08-03: qty 1

## 2022-08-03 NOTE — Evaluation (Signed)
Occupational Therapy Evaluation Patient Details Name: [name] Giles MRN: [id number] DOB: 1955-08-31 Today's Date: 08/03/2022   History of Present Illness 66 y.o. female presents to [facility] hospital on 07/30/2022 for scheduled L temporal crani and AVM resection. PMH includes asthma.   Clinical Impression   Patient admitted for the diagnosis above.  PTA she lives at home with her son, who provides and iADL assist and helps with community mobility.  The patient does present with mild unsteadiness and safety concerns, but is currently not needing physical assist, just generalized supervision for in room mobility and ADL completion.  Given assist at home, post acute OT is not anticipated.  OT will follow as long as she remains in the acute setting to ensure a safe transition home at max functional potential.        Recommendations for follow up therapy are one component of a multi-disciplinary discharge planning process, led by the attending physician.  Recommendations may be updated based on patient status, additional functional criteria and insurance authorization.   Follow Up Recommendations  No OT follow up     Assistance Recommended at Discharge Set up Supervision/Assistance  Patient can return home with the following Assist for transportation    Functional Status Assessment  Patient has had a recent decline in their functional status and demonstrates the ability to make significant improvements in function in a reasonable and predictable amount of time.  Equipment Recommendations  None recommended by OT    Recommendations for Other Services       Precautions / Restrictions Precautions Precautions: Fall Precaution Comments: arterial line Restrictions Weight Bearing Restrictions: No      Mobility Bed Mobility Overal bed mobility: Needs Assistance Bed Mobility: Supine to Sit, Sit to Supine     Supine to sit: Supervision Sit to supine: Supervision         Transfers Overall transfer level: Needs assistance Equipment used: None Transfers: Sit to/from Stand, Bed to chair/wheelchair/BSC Sit to Stand: Supervision     Step pivot transfers: Supervision            Balance Overall balance assessment: Mild deficits observed, not formally tested                                         ADL either performed or assessed with clinical judgement   ADL                                         General ADL Comments: Generalized supervision for safety and line management     Vision Baseline Vision/History: 1 Wears glasses Patient Visual Report: No change from baseline;Blurring of vision       Perception     Praxis      Pertinent Vitals/Pain Pain Assessment Pain Assessment: Faces Faces Pain Scale: Hurts a little bit Pain Location: L hand with art line and head on L side Pain Descriptors / Indicators: Discomfort, Headache Pain Intervention(s): Monitored during session     Hand Dominance Right   Extremity/Trunk Assessment Upper Extremity Assessment Upper Extremity Assessment: Overall WFL for tasks assessed   Lower Extremity Assessment Lower Extremity Assessment: Defer to PT evaluation   Cervical / Trunk Assessment Cervical / Trunk Assessment: Normal   Communication Communication Communication: No difficulties   Cognition Arousal/Alertness: Awake/alert  Behavior During Therapy: WFL for tasks assessed/performed Overall Cognitive Status: Impaired/Different from baseline                           Safety/Judgement: Decreased awareness of safety, Decreased awareness of deficits           General Comments   VSS on RA    Exercises     Shoulder Instructions      Home Living Family/patient expects to be discharged to:: Private residence Living Arrangements: Children Available Help at Discharge: Family;Friend(s);Neighbor;Available 24 hours/day Type of Home: House Home  Access: Stairs to enter [employer] of Steps: 4 Entrance Stairs-Rails: Right;Left Home Layout: One level     Bathroom Shower/Tub: Tub/shower unit;Door   [employer] Toilet: Standard Bathroom Accessibility: No   Home Equipment: Conservation officer, nature (2 wheels)          Prior Functioning/Environment Prior Level of Function : Independent/Modified Independent               ADLs Comments: son assists with some of the cooking and cleaning, along with tranportation farther distances        OT Problem List: Decreased safety awareness      OT Treatment/Interventions: Self-care/ADL training;Therapeutic activities;Balance training;Patient/family education    OT Goals(Current goals can be found in the care plan section) Acute Rehab OT Goals Patient Stated Goal: planning on return home this afternoon OT Goal Formulation: With patient Time For Goal Achievement: 08/17/22 Potential to Achieve Goals: Good ADL Goals Pt Will Perform Grooming: Independently;standing Pt Will Perform Lower Body Dressing: Independently;sit to/from stand Pt Will Transfer to Toilet: Independently;ambulating;regular height toilet  OT Frequency: Min 2X/week    Co-evaluation              AM-PAC OT "6 Clicks" Daily Activity     Outcome Measure Help from another person eating meals?: None Help from another person taking care of personal grooming?: None Help from another person toileting, which includes using toliet, bedpan, or urinal?: A Little Help from another person bathing (including washing, rinsing, drying)?: A Little Help from another person to put on and taking off regular upper body clothing?: None Help from another person to put on and taking off regular lower body clothing?: A Little 6 Click Score: 21   End of Session Nurse Communication: Mobility status  Activity Tolerance: Patient tolerated treatment well Patient left: in bed;with call bell/phone within reach  OT Visit Diagnosis:  Unsteadiness on feet (R26.81)                Time: 2119-4174 OT Time Calculation (min): 19 min Charges:  OT General Charges $OT Visit: 1 Visit OT Evaluation $OT Eval Moderate Complexity: 1 Mod  08/03/2022  RP, OTR/L  Acute Rehabilitation Services  Office:  [phone]   [name] Giles 08/03/2022, 11:01 AM

## 2022-08-03 NOTE — Discharge Summary (Signed)
Physician Discharge Summary  Patient ID: [name] Giles MRN: [id number] DOB/AGE: 67-Mar-1957 67 y.o.  Admit date: 07/31/2022 Discharge date: 08/03/2022  Admission Diagnoses:  AVM  Discharge Diagnoses:  Same Principal Problem:   AVM (arteriovenous malformation) brain   Discharged Condition: Stable  Hospital Course:  ANJELI CASAD is a 66 y.o. female admitted after elective left temporal crani for resection of AVM. She was at baseline postop and monitored in the ICU without change in her condition. Postop angiogram revealed complete resection. Blood pressure was well controlled and she requested d/c home on POD# 3.  Treatments:  Surgery - Left craniotomy for resection of AVM Diagnostic angiogram  Discharge Exam: Blood pressure (!) 130/58, pulse 87, temperature 99.5 F (37.5 C), temperature source Axillary, resp. rate 20, height '5\' 5"'$  (1.651 m), weight 62.6 kg, SpO2 92 %. Awake, alert, oriented Speech fluent, appropriate CN grossly intact 5/5 BUE/BLE Wound c/d/i  Disposition: Discharge disposition: 01-Home or Self Care       Discharge Instructions     Call MD for:  redness, tenderness, or signs of infection (pain, swelling, redness, odor or green/yellow discharge around incision site)   Complete by: As directed    Call MD for:  temperature >100.4   Complete by: As directed    Diet - low sodium heart healthy   Complete by: As directed    Discharge instructions   Complete by: As directed    Walk at home as much as possible, at least 4 times / day   Increase activity slowly   Complete by: As directed    Lifting restrictions   Complete by: As directed    No lifting > 10 lbs   May shower / Bathe   Complete by: As directed    48 hours after surgery   May walk up steps   Complete by: As directed    No dressing needed   Complete by: As directed    Other Restrictions   Complete by: As directed    No bending/twisting at waist      Allergies as of  08/03/2022   No Known Allergies      Medication List     TAKE these medications    albuterol 108 (90 Base) MCG/ACT inhaler Commonly known as: VENTOLIN HFA Inhale 1-2 puffs into the lungs every 4 (four) hours as needed for shortness of breath.   bethanechol 5 MG tablet Commonly known as: URECHOLINE Take 1 tablet (5 mg total) by mouth 2 (two) times daily.   Ensure DRINK 1-CAN (237ML) BY MOUTH TWICE DAILY FOR NUTRITION   levETIRAcetam 500 MG tablet Commonly known as: KEPPRA Take 1 tablet (500 mg total) by mouth 2 (two) times daily.   melatonin 3 MG Tabs tablet Take 1 tablet (3 mg total) by mouth at bedtime.   multivitamin with minerals Tabs tablet Take 1 tablet by mouth daily.   rosuvastatin 20 MG tablet Commonly known as: CRESTOR Take 1 tablet (20 mg total) by mouth daily.   senna-docusate 8.6-50 MG tablet Commonly known as: Senokot-S Take 1 tablet by mouth 2 (two) times daily.   Symbicort 160-4.5 MCG/ACT inhaler Generic drug: budesonide-formoterol INHALE 2 PUFFS BY MOUTH EVERY DAY               Discharge Care Instructions  (From admission, onward)           Start     Ordered   08/03/22 0000  No dressing needed  08/03/22 1233            Follow-up Information     [name], MD Follow up.   Specialty: Neurosurgery Contact information: [address] [place] [address] [phone]                 Signed: Jairo Ben 08/03/2022, 12:33 PM

## 2022-08-03 NOTE — Progress Notes (Signed)
  NEUROSURGERY PROGRESS NOTE   Pt seen and examined. No issues overnight. Ready to go home.  EXAM: Temp:  [99 F (37.2 C)-100.1 F (37.8 C)] 99.5 F (37.5 C) (12/15 0800) Pulse Rate:  [75-109] 87 (12/15 1200) Resp:  [11-23] 20 (12/15 1200) BP: (92-131)/(49-97) 130/58 (12/15 1200) SpO2:  [82 %-99 %] 92 % (12/15 1200) Arterial Line BP: (89-126)/(55-86) 89/86 (12/14 1700) Intake/Output      12/14 0701 12/15 0700 12/15 0701 12/16 0700   P.O. 360 240   I.V. (mL/kg)     IV Piggyback 299.8    Total Intake(mL/kg) 659.8 (10.5) 240 (3.8)   Urine (mL/kg/hr) 600 (0.4)    Total Output 600    Net +59.8 +240        Urine Occurrence 6 x 1 x    Awake, alert, oriented Speech fluent, occasional paraphasic errors MAE good strength Wound c/d/i  LABS: Lab Results  Component Value Date   CREATININE 0.49 08/01/2022   BUN 6 (L) 08/01/2022   NA 142 08/01/2022   K 3.4 (L) 08/01/2022   CL 112 (H) 08/01/2022   CO2 23 08/01/2022   Lab Results  Component Value Date   WBC 6.9 07/03/2022   HGB 13.6 07/03/2022   HCT 40.7 07/03/2022   MCV 90.4 07/03/2022   PLT 324 07/03/2022    IMPRESSION: - 66 y.o. 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 Post resection angiogram negative for residual AVM.  PLAN: - Will d/c home today   [name], MD [facility]

## 2022-08-08 ENCOUNTER — Encounter (HOSPITAL_COMMUNITY): Payer: Self-pay | Admitting: Neurosurgery

## 2022-08-08 LAB — TYPE AND SCREEN
ABO/RH(D): O POS
Antibody Screen: NEGATIVE
Unit division: 0
Unit division: 0

## 2022-08-08 LAB — BPAM RBC
Blood Product Expiration Date: 202401092359
Blood Product Expiration Date: 202401092359
Unit Type and Rh: 5100
Unit Type and Rh: 5100

## 2022-11-09 ENCOUNTER — Ambulatory Visit
Admission: EM | Admit: 2022-11-09 | Discharge: 2022-11-09 | Disposition: A | Discharge status: Home / Self Care | Payer: Medicare Other | Attending: Urgent Care | Admitting: Urgent Care

## 2022-11-09 ENCOUNTER — Encounter: Payer: Self-pay | Admitting: Emergency Medicine

## 2022-11-09 DIAGNOSIS — J988 Other specified respiratory disorders: Secondary | ICD-10-CM

## 2022-11-09 DIAGNOSIS — B9789 Other viral agents as the cause of diseases classified elsewhere: Secondary | ICD-10-CM | POA: Diagnosis present

## 2022-11-09 DIAGNOSIS — R070 Pain in throat: Secondary | ICD-10-CM | POA: Insufficient documentation

## 2022-11-09 DIAGNOSIS — Z8774 Personal history of (corrected) congenital malformations of heart and circulatory system: Secondary | ICD-10-CM | POA: Diagnosis present

## 2022-11-09 DIAGNOSIS — J4540 Moderate persistent asthma, uncomplicated: Secondary | ICD-10-CM | POA: Diagnosis present

## 2022-11-09 DIAGNOSIS — Z9889 Other specified postprocedural states: Secondary | ICD-10-CM

## 2022-11-09 LAB — POCT RAPID STREP A (OFFICE): Rapid Strep A Screen: NEGATIVE

## 2022-11-09 MED ORDER — DEXAMETHASONE SODIUM PHOSPHATE 10 MG/ML IJ SOLN
10.0000 mg | Freq: Once | INTRAMUSCULAR | Status: AC
  Administered 2022-11-09: 10 mg via INTRAMUSCULAR

## 2022-11-09 MED ORDER — PROMETHAZINE-DM 6.25-15 MG/5ML PO SYRP: 2.5000 mL | ORAL_SOLUTION | Freq: Three times a day (TID) | ORAL | 0 refills | Status: DC | PRN

## 2022-11-09 MED ORDER — CETIRIZINE HCL 10 MG PO TABS: 10.0000 mg | ORAL_TABLET | Freq: Every day | ORAL | 0 refills | Status: DC

## 2022-11-09 NOTE — ED Provider Notes (Signed)
[facility]  Note:  This document was prepared using Dragon voice recognition software and may include unintentional dictation errors.  MRN: [id number] DOB: 09-16-1955  Subjective:   [name] Giles is a 67 y.o. female presenting for 4 to 5-day history of acute onset persistent drainage, sinus congestion, sinus pressure, throat pain, coughing, chest heaviness.  Patient has a history of asthma and is on her Symbicort daily.  Uses albuterol as needed.  Does not need refills.  Patient has a history of cerebral arteriovenous malformation.  This required surgery in December.  Has not had to take Keppra since then.  No history of seizures.  No current facility-administered medications for this encounter.  Current Outpatient Medications:    aspirin EC 81 MG tablet, Take 81 mg by mouth daily. Swallow whole., Disp: , Rfl:    budesonide-formoterol (SYMBICORT) 160-4.5 MCG/ACT inhaler, INHALE 2 PUFFS BY MOUTH EVERY DAY, Disp: 10.2 g, Rfl: 0   albuterol (VENTOLIN HFA) 108 (90 Base) MCG/ACT inhaler, Inhale 1-2 puffs into the lungs every 4 (four) hours as needed for shortness of breath., Disp: 6.7 g, Rfl: 0   bethanechol (URECHOLINE) 5 MG tablet, Take 1 tablet (5 mg total) by mouth 2 (two) times daily. (Patient not taking: Reported on 07/25/2022), Disp: 14 tablet, Rfl: 0   Ensure (ENSURE), DRINK 1-CAN (237ML) BY MOUTH TWICE DAILY FOR NUTRITION (Patient not taking: Reported on 07/25/2022), Disp: 30 mL, Rfl: 11   levETIRAcetam (KEPPRA) 500 MG tablet, Take 1 tablet (500 mg total) by mouth 2 (two) times daily., Disp: 28 tablet, Rfl: 0   melatonin 3 MG TABS tablet, Take 1 tablet (3 mg total) by mouth at bedtime. (Patient not taking: Reported on 07/25/2022), Disp: 30 tablet, Rfl: 0   Multiple Vitamin (MULTIVITAMIN WITH MINERALS) TABS tablet, Take 1 tablet by mouth daily. (Patient not taking: Reported on 07/25/2022), Disp: , Rfl:    rosuvastatin (CRESTOR) 20 MG tablet, Take 1 tablet (20 mg total) by  mouth daily., Disp: 90 tablet, Rfl: 0   senna-docusate (SENOKOT-S) 8.6-50 MG tablet, Take 1 tablet by mouth 2 (two) times daily. (Patient not taking: Reported on 07/25/2022), Disp: , Rfl:    No Known Allergies  Past Medical History:  Diagnosis Date   Asthma    Medical history non-contributory      Past Surgical History:  Procedure Laterality Date   APPLICATION OF CRANIAL NAVIGATION Left 07/31/2022   Procedure: APPLICATION OF CRANIAL NAVIGATION;  Surgeon: [name], MD;  Location: [facility];  Service: Neurosurgery;  Laterality: Left;   COLONOSCOPY     COLONOSCOPY N/A 04/06/2013   Procedure: COLONOSCOPY;  Surgeon: Daneil Dolin, MD;  [facility];  Service: Endoscopy;  Laterality: N/A;  9:30 AM   CRANIOTOMY Left 07/31/2022   Procedure: STEREOTACTIC LEFT TEMPORAL CRANI FOR RESECTION OF ARTERIAL VENOUS MALFORMATION;  Surgeon: [name], MD;  Location: [facility];  Service: Neurosurgery;  Laterality: Left;   ENDOMETRIAL ABLATION     IR ANGIO EXTERNAL CAROTID SEL EXT CAROTID UNI L MOD SED  07/03/2022   IR ANGIO INTRA EXTRACRAN SEL INTERNAL CAROTID BILAT MOD SED  04/04/2022   IR ANGIO INTRA EXTRACRAN SEL INTERNAL CAROTID BILAT MOD SED  07/03/2022   IR ANGIO INTRA EXTRACRAN SEL INTERNAL CAROTID UNI L MOD SED  08/02/2022   IR ANGIO VERTEBRAL SEL VERTEBRAL UNI L MOD SED  07/03/2022   IR ANGIO VERTEBRAL SEL VERTEBRAL UNI L MOD SED  08/02/2022   IR US GUIDE VASC ACCESS RIGHT  04/04/2022    Family History  Adopted: Yes  Problem Relation Age of Onset   Breast cancer Mother    Hypertension Brother    Diabetes Brother    Hypertension Brother    Colon cancer Neg Hx     Social History   Tobacco Use   Smoking status: Former    Packs/day: 1.00    Years: 30.00    Additional pack years: 0.00    Total pack years: 30.00    Types: Cigarettes    Quit date: 03/29/2022    Years since quitting: 0.6   Smokeless tobacco: Never  Vaping Use   Vaping Use: Never used  Substance Use  Topics   Alcohol use: Yes    Comment: wine occasionally   Drug use: No    ROS   Objective:   Vitals: BP 126/83 (BP Location: Right Arm)   Pulse 83   Temp 98.1 F (36.7 C) (Oral)   Resp 18   SpO2 96%   Physical Exam Constitutional:      General: She is not in acute distress.    Appearance: Normal appearance. She is well-developed and normal weight. She is not ill-appearing, toxic-appearing or diaphoretic.  HENT:     Head: Normocephalic and atraumatic.     Right Ear: Tympanic membrane, ear canal and external ear normal. No drainage or tenderness. No middle ear effusion. There is no impacted cerumen. Tympanic membrane is not erythematous or bulging.     Left Ear: Tympanic membrane, ear canal and external ear normal. No drainage or tenderness.  No middle ear effusion. There is no impacted cerumen. Tympanic membrane is not erythematous or bulging.     Nose: Congestion present. No rhinorrhea.     Mouth/Throat:     Mouth: Mucous membranes are moist. No oral lesions.     Pharynx: No pharyngeal swelling, oropharyngeal exudate, posterior oropharyngeal erythema or uvula swelling.     Tonsils: No tonsillar exudate or tonsillar abscesses. 0 on the right. 0 on the left.     Comments: Thick streaks postnasal drainage overlying pharynx. Eyes:     General: No scleral icterus.       Right eye: No discharge.        Left eye: No discharge.     Extraocular Movements: Extraocular movements intact.     Right eye: Normal extraocular motion.     Left eye: Normal extraocular motion.     Conjunctiva/sclera: Conjunctivae normal.  Cardiovascular:     Rate and Rhythm: Normal rate and regular rhythm.     Heart sounds: Normal heart sounds. No murmur heard.    No friction rub. No gallop.  Pulmonary:     Effort: Pulmonary effort is normal. No respiratory distress.     Breath sounds: No stridor. No wheezing, rhonchi or rales.  Chest:     Chest wall: No tenderness.  Musculoskeletal:     Cervical back:  Normal range of motion and neck supple.  Lymphadenopathy:     Cervical: No cervical adenopathy.  Skin:    General: Skin is warm and dry.  Neurological:     General: No focal deficit present.     Mental Status: She is alert and oriented to person, place, and time.  Psychiatric:        Mood and Affect: Mood normal.        Behavior: Behavior normal.     Results for orders placed or performed during the hospital encounter of 11/09/22 (from the past 24  hour(s))  POCT rapid strep A     Status: None   Collection Time: 11/09/22 12:51 PM  Result Value Ref Range   Rapid Strep A Screen Negative Negative   IM dexamethasone 10 mg administered in clinic.  Assessment and Plan :   PDMP not reviewed this encounter.  1. Viral respiratory infection   2. Throat pain   3. Moderate persistent asthma, uncomplicated   4. History of brain surgery   5. History of arteriovenous malformation (AVM)     Deferred imaging given clear cardiopulmonary exam, hemodynamically stable vital signs.  Discussed antibiotic stewardship and patient, patient's caregiver were both agreeable.  IM steroids as above.  Use supportive care otherwise.  Deferred COVID testing given timeline of illness.  Recommend managing for viral respiratory illness. Counseled patient on potential for adverse effects with medications prescribed/recommended today, ER and return-to-clinic precautions discussed, patient verbalized understanding.    Jaynee Eagles, [place] 11/09/22 1339

## 2022-11-09 NOTE — ED Triage Notes (Signed)
Sore throat, cough, pressure between eyes since Monday.  Symptoms worse today.

## 2022-11-09 NOTE — Discharge Instructions (Addendum)
We will manage this as a viral respiratory illness. For sore throat or cough try using a honey-based tea. Use 3 teaspoons of honey with juice squeezed from half lemon. Place shaved pieces of ginger into 1/2-1 cup of water and warm over stove top. Then mix the ingredients and repeat every 4 hours as needed. Please take Tylenol 500mg -650mg  once every 6 hours for fevers, aches and pains. Hydrate very well with at least 2 liters (64 ounces) of water. Eat light meals such as soups (chicken and noodles, chicken wild rice, vegetable).  Do not eat any foods that you are allergic to.  Start an antihistamine like Zyrtec (10mg  daily) for postnasal drainage, sinus congestion.  You can take this together with the cough syrup as needed.

## 2022-11-12 LAB — CULTURE, GROUP A STREP (THRC)

## 2022-12-19 ENCOUNTER — Ambulatory Visit (HOSPITAL_COMMUNITY)
Admission: RE | Admit: 2022-12-19 | Discharge: 2022-12-19 | Disposition: A | Discharge status: Home / Self Care | Payer: Medicare Other | Source: Ambulatory Visit | Attending: Internal Medicine | Admitting: Internal Medicine

## 2022-12-19 ENCOUNTER — Other Ambulatory Visit (HOSPITAL_COMMUNITY): Payer: Self-pay | Admitting: Internal Medicine

## 2022-12-19 DIAGNOSIS — J020 Streptococcal pharyngitis: Secondary | ICD-10-CM | POA: Diagnosis not present

## 2023-12-28 ENCOUNTER — Ambulatory Visit
Admission: EM | Admit: 2023-12-28 | Discharge: 2023-12-28 | Disposition: A | Discharge status: Home / Self Care | Attending: Nurse Practitioner | Admitting: Nurse Practitioner

## 2023-12-28 DIAGNOSIS — J069 Acute upper respiratory infection, unspecified: Secondary | ICD-10-CM | POA: Diagnosis not present

## 2023-12-28 DIAGNOSIS — H6122 Impacted cerumen, left ear: Secondary | ICD-10-CM | POA: Diagnosis not present

## 2023-12-28 LAB — POC SARS CORONAVIRUS 2 AG -  ED: SARS Coronavirus 2 Ag: NEGATIVE

## 2023-12-28 MED ORDER — PROMETHAZINE-DM 6.25-15 MG/5ML PO SYRP: 5.0000 mL | ORAL_SOLUTION | Freq: Four times a day (QID) | ORAL | 0 refills | Status: DC | PRN

## 2023-12-28 MED ORDER — CETIRIZINE HCL 10 MG PO TABS: 10.0000 mg | ORAL_TABLET | Freq: Every day | ORAL | 0 refills | Status: AC

## 2023-12-28 NOTE — ED Triage Notes (Signed)
 Pt reports her left ear is clogged, nasal congestion and she has a headache x 2 days    Used flonase  and throat spray

## 2023-12-28 NOTE — Discharge Instructions (Addendum)
 The COVID test was negative. Take medication as prescribed.  Continue use of Flonase  and Chloraseptic throat lozenges while symptoms persist. May take over-the-counter Tylenol  as needed for pain, fever, or general discomfort. Increase fluids and allow for plenty of rest. Recommend use of normal saline nasal spray throughout the day for nasal congestion and runny nose. For your cough, recommend use of a humidifier in your bedroom at nighttime during sleep and to sleep elevated on pillows while symptoms persist. This most likely is a viral upper respiratory infection.  Symptoms should improve over the next 7 to 10 days.  If symptoms fail to improve, or appear to be worsening, you may follow-up in this clinic or with your primary care physician for further evaluation. Follow-up as needed.

## 2023-12-28 NOTE — ED Provider Notes (Signed)
 [facility]    CSN: [id number] Arrival date & time: 12/28/23  1034      History   Chief Complaint No chief complaint on file.   HPI [name] Giles is a 68 y.o. female.   The history is provided by the patient.   Patient presents with a 2-day history of headache, nasal congestion, and feeling as if the left ear has been clogged.  Patient denies fever, chills, ear pain, ear drainage, wheezing, difficulty breathing, chest pain, abdominal pain, nausea, vomiting, diarrhea, or rash.  Patient states that she has been outside more frequently over the past several days.  Patient states she has been using Flonase  and Chloraseptic throat lozenges.  She denies any obvious known sick contacts.  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   Gram-negative bacteremia    ICH (intracerebral hemorrhage) (HCC) 04/16/2022   Cerebral AVM    Delirium 04/11/2022   Essential hypertension 04/11/2022   Hyperlipidemia 04/11/2022   Intracerebral hemorrhage (HCC) 03/29/2022    Past Surgical History:  Procedure Laterality Date   APPLICATION OF CRANIAL NAVIGATION Left 07/31/2022   Procedure: APPLICATION OF CRANIAL NAVIGATION;  Surgeon: Augusto Blonder, MD;  Location: [facility];  Service: Neurosurgery;  Laterality: Left;   COLONOSCOPY     COLONOSCOPY N/A 04/06/2013   Procedure: COLONOSCOPY;  Surgeon: Suzette Espy, MD;  Location: [facility];  Service: Endoscopy;  Laterality: N/A;  9:30 AM   CRANIOTOMY Left 07/31/2022   Procedure: STEREOTACTIC LEFT TEMPORAL CRANI FOR RESECTION OF ARTERIAL VENOUS MALFORMATION;  Surgeon: Augusto Blonder, MD;  Location: [facility];  Service: Neurosurgery;  Laterality: Left;   ENDOMETRIAL ABLATION     IR ANGIO EXTERNAL CAROTID SEL EXT CAROTID UNI L MOD SED  07/03/2022   IR ANGIO INTRA EXTRACRAN SEL INTERNAL CAROTID BILAT MOD SED  04/04/2022   IR ANGIO INTRA EXTRACRAN SEL INTERNAL CAROTID BILAT MOD SED   07/03/2022   IR ANGIO INTRA EXTRACRAN SEL INTERNAL CAROTID UNI L MOD SED  08/02/2022   IR ANGIO VERTEBRAL SEL VERTEBRAL UNI L MOD SED  07/03/2022   IR ANGIO VERTEBRAL SEL VERTEBRAL UNI L MOD SED  08/02/2022   IR US  GUIDE VASC ACCESS RIGHT  04/04/2022    OB History   No obstetric history on file.      Home Medications    Prior to Admission medications   Medication Sig Start Date End Date Taking? Authorizing Provider  cetirizine  (ZYRTEC ) 10 MG tablet Take 1 tablet (10 mg total) by mouth daily. 12/28/23  Yes Leath-Warren, Belen Bowers, NP  promethazine -dextromethorphan (PROMETHAZINE -DM) 6.25-15 MG/5ML syrup Take 5 mLs by mouth 4 (four) times daily as needed. 12/28/23  Yes Leath-Warren, Belen Bowers, NP  albuterol  (VENTOLIN  HFA) 108 (90 Base) MCG/ACT inhaler Inhale 1-2 puffs into the lungs every 4 (four) hours as needed for shortness of breath. 04/26/22   Angiulli, Everlyn Hockey, PA-C  aspirin EC 81 MG tablet Take 81 mg by mouth daily. Swallow whole.    [provider]  bethanechol  (URECHOLINE ) 5 MG tablet Take 1 tablet (5 mg total) by mouth 2 (two) times daily. Patient not taking: Reported on 07/25/2022 04/26/22   Sterling Eisenmenger, PA-C  budesonide -formoterol  (SYMBICORT ) 160-4.5 MCG/ACT inhaler INHALE 2 PUFFS BY MOUTH EVERY DAY 05/15/22   Raulkar, Krutika P, MD  Ensure (ENSURE) DRINK 1-CAN (237ML) BY MOUTH TWICE DAILY FOR NUTRITION Patient not taking: Reported on 07/25/2022 05/03/22   Liam Redhead, MD  levETIRAcetam  (KEPPRA ) 500 MG tablet Take 1 tablet (500 mg total) by mouth 2 (two) times daily. 08/03/22   Augusto Blonder, MD  melatonin 3 MG TABS tablet Take 1 tablet (3 mg total) by mouth at bedtime. Patient not taking: Reported on 07/25/2022 04/26/22   Angiulli, Daniel J, PA-C  Multiple Vitamin (MULTIVITAMIN WITH MINERALS) TABS tablet Take 1 tablet by mouth daily. Patient not taking: Reported on 07/25/2022 04/17/22   Pokhrel, Laxman, MD  rosuvastatin  (CRESTOR ) 20 MG tablet Take 1 tablet (20  mg total) by mouth daily. 07/30/22   Raulkar, Keven Pel, MD  senna-docusate (SENOKOT-S) 8.6-50 MG tablet Take 1 tablet by mouth 2 (two) times daily. Patient not taking: Reported on 07/25/2022 04/16/22   Rosena Conradi, MD    Family History Family History  Adopted: Yes  Problem Relation Age of Onset   Breast cancer Mother    Hypertension Brother    Diabetes Brother    Hypertension Brother    Colon cancer Neg Hx     Social History Social History   Tobacco Use   Smoking status: Former    Current packs/day: 0.00    Average packs/day: 1 pack/day for 30.0 years (30.0 ttl pk-yrs)    Types: Cigarettes    Start date: 03/29/1992    Quit date: 03/29/2022    Years since quitting: 1.7   Smokeless tobacco: Never  Vaping Use   Vaping status: Never Used  Substance Use Topics   Alcohol use: Yes    Comment: wine occasionally   Drug use: No     Allergies   Patient has no known allergies.   Review of Systems Review of Systems Per HPI  Physical Exam Triage Vital Signs ED Triage Vitals  Encounter Vitals Group     BP 12/28/23 1039 135/70     Systolic BP Percentile --      Diastolic BP Percentile --      Pulse Rate 12/28/23 1039 (!) 103     Resp 12/28/23 1039 18     Temp 12/28/23 1039 98.2 F (36.8 C)     Temp Source 12/28/23 1039 Oral     SpO2 12/28/23 1039 94 %     Weight --      Height --      Head Circumference --      Peak Flow --      Pain Score 12/28/23 1041 3     Pain Loc --      Pain Education --      Exclude from Growth Chart --    No data found.  Updated Vital Signs BP 135/70 (BP Location: Right Arm)   Pulse (!) 103   Temp 98.2 F (36.8 C) (Oral)   Resp 18   SpO2 94%   Visual Acuity Right Eye Distance:   Left Eye Distance:   Bilateral Distance:    Right Eye Near:   Left Eye Near:    Bilateral Near:     Physical Exam Vitals and nursing note reviewed.  Constitutional:      General: She is not in acute distress.    Appearance: Normal appearance.   HENT:     Head: Normocephalic.     Right Ear: Tympanic membrane, ear canal and external ear normal.     Left Ear: External ear normal. There is impacted cerumen.     Ears:     Comments: Cerumen impaction present, complete removal of obstruction performed.  Post ear irrigation, left tympanic membrane is without  erythema or bulging.    Nose: Congestion present.     Mouth/Throat:     Mouth: Mucous membranes are moist.  Eyes:     Extraocular Movements: Extraocular movements intact.     Conjunctiva/sclera: Conjunctivae normal.     Pupils: Pupils are equal, round, and reactive to light.  Cardiovascular:     Rate and Rhythm: Normal rate and regular rhythm.     Pulses: Normal pulses.     Heart sounds: Normal heart sounds.  Pulmonary:     Effort: Pulmonary effort is normal. No respiratory distress.     Breath sounds: Normal breath sounds. No stridor. No wheezing, rhonchi or rales.  Abdominal:     General: Bowel sounds are normal.     Palpations: Abdomen is soft.     Tenderness: There is no abdominal tenderness.  Musculoskeletal:     Cervical back: Normal range of motion.  Lymphadenopathy:     Cervical: No cervical adenopathy.  Skin:    General: Skin is warm and dry.  Neurological:     General: No focal deficit present.     Mental Status: She is alert and oriented to person, place, and time.  Psychiatric:        Mood and Affect: Mood normal.        Behavior: Behavior normal.      UC Treatments / Results  Labs (all labs ordered are listed, but only abnormal results are displayed) Labs Reviewed  POC SARS CORONAVIRUS 2 AG -  ED    EKG   Radiology No results found.  Procedures Procedures (including critical care time)  Medications Ordered in UC Medications - No data to display  Initial Impression / Assessment and Plan / UC Course  I have reviewed the triage vital signs and the nursing notes.  Pertinent labs & imaging results that were available during my care of the  patient were reviewed by me and considered in my medical decision making (see chart for details).  COVID test is negative.  Left ear irrigation was performed with good success.  Symptoms most likely of viral etiology.  Treat with Promethazine  DM for cough, and cetirizine  10 mg as an antihistamine.  Will have patient continue Flonase .  Supportive care recommendations were provided and discussed with this patient to include fluids, rest, over-the-counter analgesics, normal saline nasal spray, and use of a humidifier during sleep.  Discussed indications with patient regarding follow-up.  Patient was in agreement with this plan of care and verbalizes understanding.  All questions were answered.  Patient stable for discharge.  Final Clinical Impressions(s) / UC Diagnoses   Final diagnoses:  Viral URI with cough  Impacted cerumen of left ear     Discharge Instructions      The COVID test was negative. Take medication as prescribed.  Continue use of Flonase  and Chloraseptic throat lozenges while symptoms persist. May take over-the-counter Tylenol  as needed for pain, fever, or general discomfort. Increase fluids and allow for plenty of rest. Recommend use of normal saline nasal spray throughout the day for nasal congestion and runny nose. For your cough, recommend use of a humidifier in your bedroom at nighttime during sleep and to sleep elevated on pillows while symptoms persist. This most likely is a viral upper respiratory infection.  Symptoms should improve over the next 7 to 10 days.  If symptoms fail to improve, or appear to be worsening, you may follow-up in this clinic or with your primary care physician for further evaluation.  Follow-up as needed.    ED Prescriptions     Medication Sig Dispense Auth. Provider   promethazine -dextromethorphan (PROMETHAZINE -DM) 6.25-15 MG/5ML syrup Take 5 mLs by mouth 4 (four) times daily as needed. 118 mL Leath-Warren, Belen Bowers, NP   cetirizine   (ZYRTEC ) 10 MG tablet Take 1 tablet (10 mg total) by mouth daily. 30 tablet Leath-Warren, Belen Bowers, NP      PDMP not reviewed this encounter.   Hardy Lia, NP 12/28/23 1158

## 2024-02-15 ENCOUNTER — Encounter (HOSPITAL_COMMUNITY): Payer: Self-pay | Admitting: Interventional Radiology

## 2024-05-29 ENCOUNTER — Ambulatory Visit
Admission: EM | Admit: 2024-05-29 | Discharge: 2024-05-29 | Disposition: A | Discharge status: Home / Self Care | Attending: Nurse Practitioner | Admitting: Nurse Practitioner

## 2024-05-29 DIAGNOSIS — Z8709 Personal history of other diseases of the respiratory system: Secondary | ICD-10-CM

## 2024-05-29 DIAGNOSIS — Z760 Encounter for issue of repeat prescription: Secondary | ICD-10-CM | POA: Diagnosis not present

## 2024-05-29 DIAGNOSIS — Z8639 Personal history of other endocrine, nutritional and metabolic disease: Secondary | ICD-10-CM | POA: Diagnosis not present

## 2024-05-29 DIAGNOSIS — J069 Acute upper respiratory infection, unspecified: Secondary | ICD-10-CM | POA: Diagnosis not present

## 2024-05-29 LAB — POC SOFIA SARS ANTIGEN FIA: SARS Coronavirus 2 Ag: NEGATIVE

## 2024-05-29 MED ORDER — PROMETHAZINE-DM 6.25-15 MG/5ML PO SYRP: 5.0000 mL | ORAL_SOLUTION | Freq: Four times a day (QID) | ORAL | 0 refills | Status: AC | PRN

## 2024-05-29 MED ORDER — ROSUVASTATIN CALCIUM 20 MG PO TABS: 20.0000 mg | ORAL_TABLET | Freq: Every day | ORAL | 0 refills | Status: AC

## 2024-05-29 MED ORDER — FLUTICASONE PROPIONATE 50 MCG/ACT NA SUSP: 2.0000 | Freq: Every day | NASAL | 0 refills | Status: AC

## 2024-05-29 MED ORDER — PREDNISONE 20 MG PO TABS: 40.0000 mg | ORAL_TABLET | Freq: Every day | ORAL | 0 refills | Status: AC

## 2024-05-29 NOTE — ED Triage Notes (Signed)
 Pt reports she has sneezing, chest discomfort when inhaling, sore throat and headache x 3 days.   Used inhaler, aspirin

## 2024-05-29 NOTE — ED Provider Notes (Signed)
 [facility]    CSN: [id number] Arrival date & time: 05/29/24  0805      History   Chief Complaint No chief complaint on file.   HPI [name] ANNUNZIATO is a 68 y.o. female.   The history is provided by the patient.   Patient presents with a 3-day history of sneezing, chest discomfort, sore throat, headache, and cough.  Patient denies fever, chills, ear pain, ear drainage, difficulty breathing, abdominal pain, nausea, vomiting, diarrhea, or rash.  Patient denies any obvious close sick contacts.  So far states she has been using her inhaler and taking aspirin for her symptoms.  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   Gram-negative bacteremia    ICH (intracerebral hemorrhage) (HCC) 04/16/2022   Cerebral AVM    Delirium 04/11/2022   Essential hypertension 04/11/2022   Hyperlipidemia 04/11/2022   Intracerebral hemorrhage (HCC) 03/29/2022    Past Surgical History:  Procedure Laterality Date   APPLICATION OF CRANIAL NAVIGATION Left 07/31/2022   Procedure: APPLICATION OF CRANIAL NAVIGATION;  Surgeon: Lanis Pupa, MD;  Location: [facility];  Service: Neurosurgery;  Laterality: Left;   COLONOSCOPY     COLONOSCOPY N/A 04/06/2013   Procedure: COLONOSCOPY;  Surgeon: Lamar CHRISTELLA Hollingshead, MD;  Location: [facility];  Service: Endoscopy;  Laterality: N/A;  9:30 AM   CRANIOTOMY Left 07/31/2022   Procedure: STEREOTACTIC LEFT TEMPORAL CRANI FOR RESECTION OF ARTERIAL VENOUS MALFORMATION;  Surgeon: Lanis Pupa, MD;  Location: [facility];  Service: Neurosurgery;  Laterality: Left;   ENDOMETRIAL ABLATION     IR ANGIO EXTERNAL CAROTID SEL EXT CAROTID UNI L MOD SED  07/03/2022   IR ANGIO INTRA EXTRACRAN SEL INTERNAL CAROTID BILAT MOD SED  04/04/2022   IR ANGIO INTRA EXTRACRAN SEL INTERNAL CAROTID BILAT MOD SED  07/03/2022   IR  ANGIO INTRA EXTRACRAN SEL INTERNAL CAROTID UNI L MOD SED  08/02/2022   IR ANGIO VERTEBRAL SEL VERTEBRAL UNI L MOD SED  07/03/2022   IR ANGIO VERTEBRAL SEL VERTEBRAL UNI L MOD SED  08/02/2022   IR US  GUIDE VASC ACCESS RIGHT  04/04/2022    OB History   No obstetric history on file.      Home Medications    Prior to Admission medications   Medication Sig Start Date End Date Taking? Authorizing Provider  albuterol  (VENTOLIN  HFA) 108 (90 Base) MCG/ACT inhaler Inhale 1-2 puffs into the lungs every 4 (four) hours as needed for shortness of breath. 04/26/22   Angiulli, Toribio PARAS, PA-C  aspirin EC 81 MG tablet Take 81 mg by mouth daily. Swallow whole.    [provider]  bethanechol  (URECHOLINE ) 5 MG tablet Take 1 tablet (5 mg total) by mouth 2 (two) times daily. Patient not taking: Reported on 07/25/2022 04/26/22   Angiulli, Toribio PARAS, PA-C  budesonide -formoterol  (SYMBICORT ) 160-4.5 MCG/ACT inhaler INHALE 2 PUFFS BY MOUTH EVERY DAY 05/15/22   Raulkar, Sven SQUIBB, MD  cetirizine  (ZYRTEC ) 10 MG tablet Take 1 tablet (10 mg total) by mouth daily. 12/28/23   Leath-Warren, Etta PARAS, NP  Ensure (ENSURE) DRINK 1-CAN (237ML) BY MOUTH TWICE DAILY FOR NUTRITION Patient not taking: Reported on 07/25/2022 05/03/22   Raulkar, Sven SQUIBB, MD  levETIRAcetam  (KEPPRA ) 500 MG tablet Take 1 tablet (500 mg total) by mouth 2 (two) times daily. 08/03/22   Lanis Pupa, MD  melatonin 3 MG TABS tablet Take 1 tablet (3 mg total) by mouth at bedtime. Patient not taking: Reported on 07/25/2022 04/26/22   Angiulli, Daniel J, PA-C  Multiple Vitamin (MULTIVITAMIN WITH MINERALS) TABS tablet Take 1 tablet by mouth daily. Patient not taking: Reported on 07/25/2022 04/17/22   Pokhrel, Laxman, MD  promethazine -dextromethorphan (PROMETHAZINE -DM) 6.25-15 MG/5ML syrup Take 5 mLs by mouth 4 (four) times daily as needed. 12/28/23   Leath-Warren, Etta PARAS, NP  rosuvastatin  (CRESTOR ) 20 MG tablet Take 1 tablet (20 mg total) by mouth  daily. 07/30/22   Raulkar, Sven SQUIBB, MD  senna-docusate (SENOKOT-S) 8.6-50 MG tablet Take 1 tablet by mouth 2 (two) times daily. Patient not taking: Reported on 07/25/2022 04/16/22   Sonjia Held, MD    Family History Family History  Adopted: Yes  Problem Relation Age of Onset   Breast cancer Mother    Hypertension Brother    Diabetes Brother    Hypertension Brother    Colon cancer Neg Hx     Social History Social History   Tobacco Use   Smoking status: Former    Current packs/day: 0.00    Average packs/day: 1 pack/day for 30.0 years (30.0 ttl pk-yrs)    Types: Cigarettes    Start date: 03/29/1992    Quit date: 03/29/2022    Years since quitting: 2.1   Smokeless tobacco: Never  Vaping Use   Vaping status: Never Used  Substance Use Topics   Alcohol use: Yes    Comment: wine occasionally   Drug use: No     Allergies   Patient has no known allergies.   Review of Systems Review of Systems Per HPI  Physical Exam Triage Vital Signs ED Triage Vitals  Encounter Vitals Group     BP 05/29/24 0855 (!) 153/82     Girls Systolic BP Percentile --      Girls Diastolic BP Percentile --      Boys Systolic BP Percentile --      Boys Diastolic BP Percentile --      Pulse Rate 05/29/24 0855 98     Resp 05/29/24 0855 18     Temp 05/29/24 0855 98.2 F (36.8 C)     Temp Source 05/29/24 0855 Oral     SpO2 05/29/24 0855 98 %     Weight --      Height --      Head Circumference --      Peak Flow --      Pain Score 05/29/24 0857 0     Pain Loc --      Pain Education --      Exclude from Growth Chart --    No data found.  Updated Vital Signs BP (!) 153/82 (BP Location: Right Arm)   Pulse 98   Temp 98.2 F (36.8 C) (Oral)   Resp 18   SpO2 98%   Visual Acuity Right Eye Distance:   Left Eye Distance:   Bilateral Distance:    Right Eye Near:   Left Eye Near:    Bilateral Near:     Physical Exam Vitals and nursing note reviewed.  Constitutional:      General:  She is not in acute distress.    Appearance: Normal appearance. She is well-developed.  HENT:     Head: Normocephalic and atraumatic.     Right Ear: Tympanic membrane, ear canal and external ear normal.     Left Ear: Tympanic membrane, ear canal and external ear  normal.     Nose: Congestion present.     Right Turbinates: Enlarged and swollen.     Left Turbinates: Enlarged and swollen.     Right Sinus: No maxillary sinus tenderness or frontal sinus tenderness.     Left Sinus: No maxillary sinus tenderness or frontal sinus tenderness.     Mouth/Throat:     Lips: Pink.     Mouth: Mucous membranes are moist.     Pharynx: Uvula midline. Posterior oropharyngeal erythema and postnasal drip present. No pharyngeal swelling, oropharyngeal exudate or uvula swelling.     Comments: Cobblestoning present to posterior oropharynx  Eyes:     Extraocular Movements: Extraocular movements intact.     Conjunctiva/sclera: Conjunctivae normal.     Pupils: Pupils are equal, round, and reactive to light.  Neck:     Thyroid: No thyromegaly.     Trachea: No tracheal deviation.  Cardiovascular:     Rate and Rhythm: Normal rate and regular rhythm.     Pulses: Normal pulses.     Heart sounds: Normal heart sounds.  Pulmonary:     Effort: Pulmonary effort is normal. No respiratory distress.     Breath sounds: Normal breath sounds. No stridor. No wheezing, rhonchi or rales.  Abdominal:     General: Bowel sounds are normal.     Palpations: Abdomen is soft.     Tenderness: There is no abdominal tenderness.  Musculoskeletal:     Cervical back: Normal range of motion and neck supple.  Skin:    General: Skin is warm and dry.  Neurological:     General: No focal deficit present.     Mental Status: She is alert and oriented to person, place, and time.  Psychiatric:        Mood and Affect: Mood normal.        Behavior: Behavior normal.        Thought Content: Thought content normal.        Judgment: Judgment  normal.      UC Treatments / Results  Labs (all labs ordered are listed, but only abnormal results are displayed) Labs Reviewed  POC SOFIA SARS ANTIGEN FIA    EKG   Radiology No results found.  Procedures Procedures (including critical care time)  Medications Ordered in UC Medications - No data to display  Initial Impression / Assessment and Plan / UC Course  I have reviewed the triage vital signs and the nursing notes.  Pertinent labs & imaging results that were available during my care of the patient were reviewed by me and considered in my medical decision making (see chart for details).  On exam, lung sounds are clear throughout, room air sats at 98%.  The COVID test was negative.  Patient with underlying history of asthma.  Suspect a viral URI that may be exacerbating her asthma symptoms.  Will cover with prednisone 40 mg for the next 5 days.  Symptomatic treatment provided with Promethazine  DM for the cough, and fluticasone  50 micro nasal spray for nasal congestion and postnasal drainage.  Patient also requested refill for her rosuvastatin  20 mg which was also provided and a 30-day supply.  Supportive care recommendations were provided and discussed with the patient to include fluids, rest, over-the-counter analgesics, and use of a humidifier during sleep.  Discussed indications with patient regarding follow-up.  Patient was in agreement with this plan of care and verbalizes understanding.  All questions were answered.  Patient stable for discharge.  Final Clinical  Impressions(s) / UC Diagnoses   Final diagnoses:  None   Discharge Instructions   None    ED Prescriptions   None    PDMP not reviewed this encounter.   Gilmer Etta PARAS, NP 05/29/24 [phone]

## 2024-05-29 NOTE — Discharge Instructions (Addendum)
 The COVID test was negative. Take medication as prescribed.  You may take over-the-counter Tylenol  as needed for pain, fever, or general discomfort. Increase fluids and allow for plenty of rest. Recommend use of normal saline nasal spray throughout the day for nasal congestion and runny nose. For your cough, recommend use of a humidifier in your bedroom at nighttime during sleep and to sleep elevated on pillows while symptoms persist. This most likely is a viral upper respiratory infection.  Symptoms should improve over the next 7 to 10 days.  If symptoms fail to improve, or appear to be worsening, you may follow-up in this clinic or with your primary care physician for further evaluation.    A 30-day supply of rosuvastatin  has been provided.  Please follow-up with your primary care physician for further treatment and maintenance of your cholesterol.  Follow-up as needed.
